# Patient Record
Sex: Female | Born: 1965 | Race: Black or African American | Hispanic: No | Marital: Single | Smoking: Former smoker
Health system: Southern US, Community
[De-identification: ages and names within clinical notes are randomized; demographics above are authoritative.]

## PROBLEM LIST (undated history)

## (undated) DIAGNOSIS — Z8711 Personal history of peptic ulcer disease: Secondary | ICD-10-CM

## (undated) DIAGNOSIS — Z8719 Personal history of other diseases of the digestive system: Secondary | ICD-10-CM

## (undated) DIAGNOSIS — I10 Essential (primary) hypertension: Secondary | ICD-10-CM

## (undated) DIAGNOSIS — R630 Anorexia: Secondary | ICD-10-CM

## (undated) DIAGNOSIS — IMO0001 Reserved for inherently not codable concepts without codable children: Secondary | ICD-10-CM

## (undated) DIAGNOSIS — K219 Gastro-esophageal reflux disease without esophagitis: Secondary | ICD-10-CM

## (undated) DIAGNOSIS — Z9889 Other specified postprocedural states: Secondary | ICD-10-CM

## (undated) DIAGNOSIS — D649 Anemia, unspecified: Secondary | ICD-10-CM

## (undated) DIAGNOSIS — M7072 Other bursitis of hip, left hip: Secondary | ICD-10-CM

## (undated) DIAGNOSIS — G43909 Migraine, unspecified, not intractable, without status migrainosus: Secondary | ICD-10-CM

## (undated) DIAGNOSIS — R112 Nausea with vomiting, unspecified: Secondary | ICD-10-CM

## (undated) HISTORY — PX: CHOLECYSTECTOMY: SHX55

## (undated) HISTORY — PX: JOINT REPLACEMENT: SHX530

## (undated) HISTORY — PX: BARTHOLIN GLAND CYST EXCISION: SHX565

## (undated) HISTORY — PX: HERNIA REPAIR: SHX51

---
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
## 1992-07-05 HISTORY — PX: TUBAL LIGATION: SHX77

## 2003-04-16 ENCOUNTER — Encounter: Payer: Self-pay | Admitting: Emergency Medicine

## 2003-04-16 ENCOUNTER — Emergency Department (HOSPITAL_COMMUNITY): Admission: EM | Admit: 2003-04-16 | Discharge: 2003-04-16 | Payer: Self-pay | Admitting: Emergency Medicine

## 2003-06-25 ENCOUNTER — Emergency Department (HOSPITAL_COMMUNITY): Admission: EM | Admit: 2003-06-25 | Discharge: 2003-06-26 | Payer: Self-pay | Admitting: *Deleted

## 2003-07-06 HISTORY — PX: EPIGASTRIC HERNIA REPAIR: SUR1177

## 2003-07-09 ENCOUNTER — Emergency Department (HOSPITAL_COMMUNITY): Admission: EM | Admit: 2003-07-09 | Discharge: 2003-07-09 | Payer: Self-pay | Admitting: Emergency Medicine

## 2003-07-12 ENCOUNTER — Ambulatory Visit (HOSPITAL_COMMUNITY): Admission: RE | Admit: 2003-07-12 | Discharge: 2003-07-12 | Payer: Self-pay

## 2003-07-12 ENCOUNTER — Ambulatory Visit (HOSPITAL_BASED_OUTPATIENT_CLINIC_OR_DEPARTMENT_OTHER): Admission: RE | Admit: 2003-07-12 | Discharge: 2003-07-12 | Payer: Self-pay

## 2003-09-19 ENCOUNTER — Emergency Department (HOSPITAL_COMMUNITY): Admission: EM | Admit: 2003-09-19 | Discharge: 2003-09-19 | Payer: Self-pay | Admitting: Emergency Medicine

## 2004-04-24 ENCOUNTER — Emergency Department (HOSPITAL_COMMUNITY): Admission: EM | Admit: 2004-04-24 | Discharge: 2004-04-25 | Payer: Self-pay | Admitting: Emergency Medicine

## 2004-08-19 ENCOUNTER — Emergency Department (HOSPITAL_COMMUNITY): Admission: EM | Admit: 2004-08-19 | Discharge: 2004-08-19 | Payer: Self-pay | Admitting: Emergency Medicine

## 2004-10-16 ENCOUNTER — Emergency Department (HOSPITAL_COMMUNITY): Admission: EM | Admit: 2004-10-16 | Discharge: 2004-10-16 | Payer: Self-pay | Admitting: Emergency Medicine

## 2004-11-17 ENCOUNTER — Emergency Department (HOSPITAL_COMMUNITY): Admission: EM | Admit: 2004-11-17 | Discharge: 2004-11-17 | Payer: Self-pay | Admitting: Internal Medicine

## 2005-05-11 ENCOUNTER — Emergency Department (HOSPITAL_COMMUNITY): Admission: EM | Admit: 2005-05-11 | Discharge: 2005-05-11 | Payer: Self-pay | Admitting: *Deleted

## 2005-07-19 ENCOUNTER — Emergency Department (HOSPITAL_COMMUNITY): Admission: EM | Admit: 2005-07-19 | Discharge: 2005-07-20 | Payer: Self-pay | Admitting: Emergency Medicine

## 2005-10-03 HISTORY — PX: INCISION AND DRAINAGE ABSCESS: SHX5864

## 2005-10-15 ENCOUNTER — Emergency Department (HOSPITAL_COMMUNITY): Admission: EM | Admit: 2005-10-15 | Discharge: 2005-10-16 | Payer: Self-pay | Admitting: Emergency Medicine

## 2005-10-18 ENCOUNTER — Emergency Department (HOSPITAL_COMMUNITY): Admission: EM | Admit: 2005-10-18 | Discharge: 2005-10-19 | Payer: Self-pay | Admitting: Emergency Medicine

## 2005-10-21 ENCOUNTER — Ambulatory Visit: Payer: Self-pay | Admitting: Obstetrics and Gynecology

## 2005-12-16 ENCOUNTER — Ambulatory Visit: Payer: Self-pay | Admitting: *Deleted

## 2005-12-16 ENCOUNTER — Encounter (INDEPENDENT_AMBULATORY_CARE_PROVIDER_SITE_OTHER): Payer: Self-pay | Admitting: *Deleted

## 2006-04-26 ENCOUNTER — Emergency Department (HOSPITAL_COMMUNITY): Admission: EM | Admit: 2006-04-26 | Discharge: 2006-04-26 | Payer: Self-pay | Admitting: Family Medicine

## 2006-05-06 ENCOUNTER — Encounter: Admission: RE | Admit: 2006-05-06 | Discharge: 2006-05-06 | Payer: Self-pay | Admitting: Family Medicine

## 2006-09-07 ENCOUNTER — Emergency Department (HOSPITAL_COMMUNITY): Admission: EM | Admit: 2006-09-07 | Discharge: 2006-09-07 | Payer: Self-pay | Admitting: Emergency Medicine

## 2007-02-26 ENCOUNTER — Emergency Department (HOSPITAL_COMMUNITY): Admission: EM | Admit: 2007-02-26 | Discharge: 2007-02-26 | Payer: Self-pay | Admitting: Emergency Medicine

## 2008-01-17 ENCOUNTER — Emergency Department (HOSPITAL_COMMUNITY): Admission: EM | Admit: 2008-01-17 | Discharge: 2008-01-17 | Payer: Self-pay | Admitting: Emergency Medicine

## 2008-06-13 ENCOUNTER — Emergency Department (HOSPITAL_COMMUNITY): Admission: EM | Admit: 2008-06-13 | Discharge: 2008-06-13 | Payer: Self-pay | Admitting: Emergency Medicine

## 2008-11-25 ENCOUNTER — Emergency Department (HOSPITAL_COMMUNITY): Admission: EM | Admit: 2008-11-25 | Discharge: 2008-11-25 | Payer: Self-pay | Admitting: Family Medicine

## 2009-01-13 ENCOUNTER — Emergency Department (HOSPITAL_COMMUNITY): Admission: EM | Admit: 2009-01-13 | Discharge: 2009-01-13 | Payer: Self-pay | Admitting: Family Medicine

## 2009-01-16 ENCOUNTER — Ambulatory Visit: Payer: Self-pay | Admitting: *Deleted

## 2009-01-31 ENCOUNTER — Ambulatory Visit: Payer: Self-pay | Admitting: Internal Medicine

## 2009-03-20 ENCOUNTER — Ambulatory Visit: Payer: Self-pay | Admitting: Internal Medicine

## 2009-03-20 ENCOUNTER — Encounter: Payer: Self-pay | Admitting: Family Medicine

## 2009-03-20 LAB — CONVERTED CEMR LAB
Alkaline Phosphatase: 44 units/L (ref 39–117)
BUN: 13 mg/dL (ref 6–23)
Basophils Relative: 1 % (ref 0–1)
CO2: 25 meq/L (ref 19–32)
Creatinine, Ser: 0.67 mg/dL (ref 0.40–1.20)
Eosinophils Absolute: 0.3 10*3/uL (ref 0.0–0.7)
Eosinophils Relative: 5 % (ref 0–5)
Glucose, Bld: 100 mg/dL — ABNORMAL HIGH (ref 70–99)
LDL Cholesterol: 119 mg/dL — ABNORMAL HIGH (ref 0–99)
Lymphs Abs: 2.5 10*3/uL (ref 0.7–4.0)
Monocytes Absolute: 0.4 10*3/uL (ref 0.1–1.0)
RBC: 4.21 M/uL (ref 3.87–5.11)
RDW: 14.6 % (ref 11.5–15.5)
TSH: 0.647 microintl units/mL (ref 0.350–4.500)
Total Bilirubin: 0.4 mg/dL (ref 0.3–1.2)
Total CHOL/HDL Ratio: 4.3
Total Protein: 6.9 g/dL (ref 6.0–8.3)
Triglycerides: 92 mg/dL (ref <150)
VLDL: 18 mg/dL (ref 0–40)
WBC: 5.8 10*3/uL (ref 4.0–10.5)

## 2009-05-27 ENCOUNTER — Emergency Department (HOSPITAL_COMMUNITY): Admission: EM | Admit: 2009-05-27 | Discharge: 2009-05-27 | Payer: Self-pay | Admitting: Emergency Medicine

## 2009-06-02 ENCOUNTER — Ambulatory Visit: Payer: Self-pay | Admitting: Internal Medicine

## 2009-07-15 ENCOUNTER — Emergency Department (HOSPITAL_COMMUNITY): Admission: EM | Admit: 2009-07-15 | Discharge: 2009-07-15 | Payer: Self-pay | Admitting: Emergency Medicine

## 2009-07-19 ENCOUNTER — Emergency Department (HOSPITAL_COMMUNITY): Admission: EM | Admit: 2009-07-19 | Discharge: 2009-07-19 | Payer: Self-pay | Admitting: Emergency Medicine

## 2009-08-02 ENCOUNTER — Ambulatory Visit (HOSPITAL_COMMUNITY): Admission: RE | Admit: 2009-08-02 | Discharge: 2009-08-02 | Payer: Self-pay | Admitting: Chiropractic Medicine

## 2010-02-06 ENCOUNTER — Ambulatory Visit: Payer: Self-pay | Admitting: Family Medicine

## 2010-02-06 ENCOUNTER — Encounter (INDEPENDENT_AMBULATORY_CARE_PROVIDER_SITE_OTHER): Payer: Self-pay | Admitting: Internal Medicine

## 2010-02-06 LAB — CONVERTED CEMR LAB: Microalb, Ur: 6.1 mg/dL — ABNORMAL HIGH (ref 0.00–1.89)

## 2010-02-13 ENCOUNTER — Ambulatory Visit (HOSPITAL_COMMUNITY): Admission: RE | Admit: 2010-02-13 | Discharge: 2010-02-13 | Payer: Self-pay | Admitting: Family Medicine

## 2010-04-08 ENCOUNTER — Ambulatory Visit: Payer: Self-pay | Admitting: Internal Medicine

## 2010-04-08 ENCOUNTER — Encounter (INDEPENDENT_AMBULATORY_CARE_PROVIDER_SITE_OTHER): Payer: Self-pay | Admitting: *Deleted

## 2010-04-08 LAB — CONVERTED CEMR LAB
ALT: 9 units/L (ref 0–35)
Albumin: 4 g/dL (ref 3.5–5.2)
Alkaline Phosphatase: 45 units/L (ref 39–117)
BUN: 11 mg/dL (ref 6–23)
Basophils Relative: 1 % (ref 0–1)
CO2: 28 meq/L (ref 19–32)
Cholesterol: 189 mg/dL (ref 0–200)
Glucose, Bld: 112 mg/dL — ABNORMAL HIGH (ref 70–99)
LDL Cholesterol: 123 mg/dL — ABNORMAL HIGH (ref 0–99)
Lymphocytes Relative: 39 % (ref 12–46)
Lymphs Abs: 2.4 10*3/uL (ref 0.7–4.0)
Monocytes Relative: 6 % (ref 3–12)
Neutrophils Relative %: 51 % (ref 43–77)
Platelets: 281 10*3/uL (ref 150–400)
RDW: 14.9 % (ref 11.5–15.5)
Total Protein: 7 g/dL (ref 6.0–8.3)
Triglycerides: 119 mg/dL (ref <150)
WBC: 6.1 10*3/uL (ref 4.0–10.5)

## 2010-09-20 LAB — URINE MICROSCOPIC-ADD ON

## 2010-09-20 LAB — URINALYSIS, ROUTINE W REFLEX MICROSCOPIC
Bilirubin Urine: NEGATIVE
Glucose, UA: NEGATIVE mg/dL
Nitrite: NEGATIVE
Urobilinogen, UA: 0.2 mg/dL (ref 0.0–1.0)
pH: 7.5 (ref 5.0–8.0)

## 2010-09-20 LAB — DIFFERENTIAL
Eosinophils Absolute: 0.3 10*3/uL (ref 0.0–0.7)
Lymphocytes Relative: 42 % (ref 12–46)
Monocytes Absolute: 0.6 10*3/uL (ref 0.1–1.0)
Neutro Abs: 3.5 10*3/uL (ref 1.7–7.7)
Neutrophils Relative %: 46 % (ref 43–77)

## 2010-09-20 LAB — BASIC METABOLIC PANEL
Calcium: 9.3 mg/dL (ref 8.4–10.5)
Chloride: 105 mEq/L (ref 96–112)
Creatinine, Ser: 0.63 mg/dL (ref 0.4–1.2)
GFR calc Af Amer: >60 mL/min (ref >60)
Potassium: 3.9 mEq/L (ref 3.5–5.1)
Sodium: 140 mEq/L (ref 135–145)

## 2010-09-20 LAB — URINE CULTURE

## 2010-09-20 LAB — CBC
HCT: 33.1 % — ABNORMAL LOW (ref 36.0–46.0)
MCV: 87.1 fL (ref 78.0–100.0)
Platelets: 239 10*3/uL (ref 150–400)

## 2010-10-01 ENCOUNTER — Emergency Department (HOSPITAL_COMMUNITY)
Admission: EM | Admit: 2010-10-01 | Discharge: 2010-10-01 | Disposition: A | Discharge status: Home / Self Care | Payer: Self-pay | Attending: Emergency Medicine | Admitting: Emergency Medicine

## 2010-10-01 DIAGNOSIS — Z79899 Other long term (current) drug therapy: Secondary | ICD-10-CM | POA: Insufficient documentation

## 2010-10-01 DIAGNOSIS — M545 Low back pain, unspecified: Secondary | ICD-10-CM | POA: Insufficient documentation

## 2010-10-01 DIAGNOSIS — J45909 Unspecified asthma, uncomplicated: Secondary | ICD-10-CM | POA: Insufficient documentation

## 2010-10-01 DIAGNOSIS — M25559 Pain in unspecified hip: Secondary | ICD-10-CM | POA: Insufficient documentation

## 2010-10-01 DIAGNOSIS — I10 Essential (primary) hypertension: Secondary | ICD-10-CM | POA: Insufficient documentation

## 2010-11-20 NOTE — Group Therapy Note (Signed)
NAME:  [name], ELFORD NO.:  [id number]   MEDICAL RECORD NO.:  [id number]          PATIENT TYPE:  WOC   LOCATION:  [facility]                   FACILITY:  [facility]   PHYSICIAN:  Argentina Donovan, MD        DATE OF BIRTH:  January 23, 1966   DATE OF SERVICE:  10/21/2005                                    CLINIC NOTE   CLINIC VISIT:  The patient is a 45 year old gravida 4, para 2, 0, 2, 2, who  went into the [facility] Emergency Room on [place] 13, 2007,  with a Bartholin gland abscess of the left labia.  This was of considerable  size apparently and was lanced and is healing well with no Ward catheter  placed in.  We discussed recurrence, a Ward catheter and marsupialization  with the patient.  We also told her that if it started swelling before that,  to come in and we could treat that.  We described the physiology and the use  of the gland, and also what caused the cyst and abscess formation.  She  seems to understand, and I would expect that eventually this will recur.   IMPRESSION:  Healing well post-incision and drainage for Bartholin abscess.           ______________________________  Argentina Donovan, MD     PR/MEDQ  D:  10/21/2005  T:  10/22/2005  Job:  [id number]

## 2010-11-20 NOTE — Op Note (Signed)
NAME:  [name], [name] NO.:  [id number]   MEDICAL RECORD NO.:  [id number]                   PATIENT TYPE:  AMB   LOCATION:  [facility]                                  FACILITY:  [facility]   PHYSICIAN:  Lorre Munroe., M.D.            DATE OF BIRTH:  January 17, 1966   DATE OF PROCEDURE:  07/12/2003  DATE OF DISCHARGE:                                 OPERATIVE REPORT   PREOPERATIVE DIAGNOSIS:  Epigastric hernia.   POSTOPERATIVE DIAGNOSIS:  Epigastric hernia.   OPERATION:  Repair of epigastric hernia.   SURGEON:  Lebron Conners, M.D.   ANESTHESIA:  General and local.   DESCRIPTION OF PROCEDURE:  After the patient was monitored and anesthetized  and had routine preparation and draping of the midabdomen, I made a 3 cm  incision above the soft bulge in the epigastrium and dissected down to it.  I separated the hernia contents from the normal surrounding fat and  dissected all the way down to the fascia.  The defect was about 2 cm in  size.  I cut the attachments of the hernia sac to the fascia and then  enlarged the opening very slightly and reduced the sizeable hernia to the  preperitoneal space.  There was no evidence that any omentum or other intra-  abdominal viscera were contained in the hernia.  I then spread out a 4 cm  circle of polypropylene mesh underneath the fascia after freeing up the  fascia from the overlying subcutaneous tissue to provide better mobility.  I  then closed the defect transversely with 0 Prolene, incorporating the mesh  with several of the sutures.  This completely obliterated the defect, and I  felt it was a strong repair.  I thoroughly anesthetized the surgical site  with long-acting local anesthetic and approximated the subcutaneous tissues  with several sutures of 4-0 Vicryl and closed the skin with intracuticular 4-  0 Vicryl and Steri-Strips.  I applied a bulky, somewhat compressive bandage.  The sponge, needle, and instrument  counts were correct.  The patient  tolerated the operation well.                                               Lorre Munroe., M.D.    Jodi Marble  D:  07/12/2003  T:  07/13/2003  Job:  [id number]

## 2010-11-20 NOTE — Group Therapy Note (Signed)
NAME:  DALLIS, DARDEN NO.:  [id number]   MEDICAL RECORD NO.:  [id number]          PATIENT TYPE:  WOC   LOCATION:  [facility]                   FACILITY:  [facility]   PHYSICIAN:  Carolanne Grumbling, M.D.   DATE OF BIRTH:  07-May-1966   DATE OF SERVICE:  12/16/2005                                    CLINIC NOTE   CHIEF COMPLAINT:  Wellness exam.   HISTORY OF PRESENT ILLNESS:  A 45 year old G4, P3-0-1-3, here for wellness  exam.  No complaints at this time.  Denies any history of any abnormal Pap  smears.  Menarche was at age 5.  Reports that she is having regular  periods.  LMP was December 07, 2005.   PAST MEDICAL HISTORY:  Asthma.   PAST SURGICAL HISTORY:  1.  Cholecystectomy.  2.  Tonsillectomy.  3.  Adenoidectomy.  4.  BTL.  5.  Hernia operation.   GYNECOLOGY HISTORY:  Per HPI.   MEDICATIONS:  Proventil as needed.   ALLERGIES:  SINGULAIR AND AMPICILLIN.   PHYSICAL EXAMINATION:  VITAL SIGNS:  Temperature 98.9, blood pressure  146/83, pulse 66, weight 83.6 pounds, height 5 feet 2 inches.  GENERAL APPEARANCE:  Single, alert, well-nourished female in no apparent  distress.  HEENT:  Normocephalic, atraumatic.  NECK:  Supple.  NO thyromegaly.  CV:  Regular rate and rhythm.  No murmur appreciated.  LUNGS:  Clear bilaterally.  Good air movement.  ABDOMEN:  Soft, nontender, nondistended.  Positive bowel sounds.  GU:  Normal external genitalia with pink rugae.  Cervix is multiparous.  Uterus is normal size.  Adnexa is free of masses.  BREASTS:  Symmetric.  No lesions.  No nodules.   IMPRESSION:  1.  Wellness exam.  2.  Elevated blood pressure.   PLAN:  Pap done today.  The patient does not desire any birth control  because she had a tubal.  Counseled to take her blood pressure at a store  and keep track of it to see if this is a problem for her.           ______________________________  Carolanne Grumbling, M.D.     TW/MEDQ  D:  01/14/2006  T:  01/14/2006  Job:   [id number]

## 2010-11-22 ENCOUNTER — Emergency Department (HOSPITAL_COMMUNITY)
Admission: EM | Admit: 2010-11-22 | Discharge: 2010-11-22 | Disposition: A | Discharge status: Home / Self Care | Payer: Self-pay | Attending: Emergency Medicine | Admitting: Emergency Medicine

## 2010-11-22 DIAGNOSIS — R1013 Epigastric pain: Secondary | ICD-10-CM | POA: Insufficient documentation

## 2010-11-22 DIAGNOSIS — J45909 Unspecified asthma, uncomplicated: Secondary | ICD-10-CM | POA: Insufficient documentation

## 2010-11-22 DIAGNOSIS — I10 Essential (primary) hypertension: Secondary | ICD-10-CM | POA: Insufficient documentation

## 2010-11-22 DIAGNOSIS — R1110 Vomiting, unspecified: Secondary | ICD-10-CM | POA: Insufficient documentation

## 2010-11-22 LAB — DIFFERENTIAL
Basophils Relative: 1 % (ref 0–1)
Monocytes Relative: 5 % (ref 3–12)
Neutro Abs: 2.9 10*3/uL (ref 1.7–7.7)
Neutrophils Relative %: 44 % (ref 43–77)

## 2010-11-22 LAB — URINALYSIS, ROUTINE W REFLEX MICROSCOPIC
Bilirubin Urine: NEGATIVE
Ketones, ur: NEGATIVE mg/dL
Leukocytes, UA: NEGATIVE
Nitrite: NEGATIVE
Urobilinogen, UA: 1 mg/dL (ref 0.0–1.0)

## 2010-11-22 LAB — COMPREHENSIVE METABOLIC PANEL
ALT: 11 U/L (ref 0–35)
AST: 13 U/L (ref 0–37)
CO2: 28 mEq/L (ref 19–32)
Chloride: 104 mEq/L (ref 96–112)
Creatinine, Ser: 0.58 mg/dL (ref 0.4–1.2)
GFR calc Af Amer: >60 mL/min (ref >60)
GFR calc non Af Amer: >60 mL/min (ref >60)
Sodium: 141 mEq/L (ref 135–145)
Total Bilirubin: 0.1 mg/dL — ABNORMAL LOW (ref 0.3–1.2)

## 2010-11-22 LAB — CBC
Hemoglobin: 11 g/dL — ABNORMAL LOW (ref 12.0–15.0)
MCH: 26 pg (ref 26.0–34.0)
RBC: 4.23 MIL/uL (ref 3.87–5.11)

## 2010-11-22 LAB — POCT PREGNANCY, URINE: Preg Test, Ur: NEGATIVE

## 2010-11-22 LAB — URINE MICROSCOPIC-ADD ON

## 2011-04-09 LAB — URINALYSIS, ROUTINE W REFLEX MICROSCOPIC
Ketones, ur: NEGATIVE mg/dL
Nitrite: NEGATIVE
Specific Gravity, Urine: 1.021 (ref 1.005–1.030)
Urobilinogen, UA: 0.2 mg/dL (ref 0.0–1.0)
pH: 6.5 (ref 5.0–8.0)

## 2011-04-09 LAB — BASIC METABOLIC PANEL
BUN: 8 mg/dL (ref 6–23)
Calcium: 9.6 mg/dL (ref 8.4–10.5)
Creatinine, Ser: 0.66 mg/dL (ref 0.4–1.2)
GFR calc non Af Amer: >60 mL/min (ref >60)
Glucose, Bld: 116 mg/dL — ABNORMAL HIGH (ref 70–99)

## 2011-04-09 LAB — DIFFERENTIAL
Basophils Absolute: 0 10*3/uL (ref 0.0–0.1)
Lymphocytes Relative: 35 % (ref 12–46)
Neutro Abs: 4.1 10*3/uL (ref 1.7–7.7)

## 2011-04-09 LAB — CBC
Platelets: 253 10*3/uL (ref 150–400)
RDW: 15.2 % (ref 11.5–15.5)

## 2011-04-09 LAB — URINE MICROSCOPIC-ADD ON

## 2011-04-16 LAB — URINALYSIS, ROUTINE W REFLEX MICROSCOPIC
Glucose, UA: NEGATIVE
Protein, ur: 100 — AB
pH: 5.5

## 2011-04-16 LAB — URINE CULTURE

## 2011-04-16 LAB — COMPREHENSIVE METABOLIC PANEL
ALT: 13
Alkaline Phosphatase: 58
BUN: 12
CO2: 28
Chloride: 101
Glucose, Bld: 105 — ABNORMAL HIGH
Potassium: 2.9 — ABNORMAL LOW
Sodium: 136
Total Bilirubin: 0.3

## 2011-04-16 LAB — URINE MICROSCOPIC-ADD ON

## 2011-04-16 LAB — DIFFERENTIAL
Basophils Absolute: 0
Basophils Relative: 0
Eosinophils Absolute: 0.2
Monocytes Relative: 8
Neutro Abs: 5.2
Neutrophils Relative %: 68

## 2011-04-16 LAB — CBC
HCT: 32.4 — ABNORMAL LOW
Hemoglobin: 10.9 — ABNORMAL LOW
RBC: 3.83 — ABNORMAL LOW
WBC: 7.7

## 2011-04-16 LAB — POCT PREGNANCY, URINE: Preg Test, Ur: NEGATIVE

## 2011-05-24 ENCOUNTER — Encounter: Payer: Self-pay | Admitting: *Deleted

## 2011-05-24 ENCOUNTER — Emergency Department (HOSPITAL_COMMUNITY)
Admission: EM | Admit: 2011-05-24 | Discharge: 2011-05-24 | Disposition: A | Discharge status: Home / Self Care | Payer: Self-pay | Attending: Emergency Medicine | Admitting: Emergency Medicine

## 2011-05-24 DIAGNOSIS — M79609 Pain in unspecified limb: Secondary | ICD-10-CM | POA: Insufficient documentation

## 2011-05-24 DIAGNOSIS — M79605 Pain in left leg: Secondary | ICD-10-CM

## 2011-05-24 MED ORDER — OXYCODONE-ACETAMINOPHEN 5-325 MG PO TABS: 2.0000 | ORAL_TABLET | ORAL | Status: AC | PRN

## 2011-05-24 MED ORDER — OXYCODONE-ACETAMINOPHEN 5-325 MG PO TABS
1.0000 | ORAL_TABLET | Freq: Once | ORAL | Status: AC
  Administered 2011-05-24: 1 via ORAL
  Filled 2011-05-24: qty 1

## 2011-05-24 MED ORDER — ENOXAPARIN SODIUM 80 MG/0.8ML ~~LOC~~ SOLN
1.0000 mg/kg | Freq: Once | SUBCUTANEOUS | Status: AC
  Administered 2011-05-24: 80 mg via SUBCUTANEOUS
  Filled 2011-05-24: qty 0.8

## 2011-05-24 MED ORDER — IBUPROFEN 600 MG PO TABS: 600.0000 mg | ORAL_TABLET | Freq: Three times a day (TID) | ORAL | Status: AC | PRN

## 2011-05-24 NOTE — ED Provider Notes (Signed)
History     CSN: [id number] Arrival date & time: 05/24/2011  5:54 PM   First MD Initiated Contact with Patient 05/24/11 2131      Chief Complaint  Patient presents with  . Leg Pain    (Consider location/radiation/quality/duration/timing/severity/associated sxs/prior treatment) The history is provided by the patient.   patient reports approximately 2 weeks of left calf pain with radiation to her leg.  She also reports swelling of her left calf and left upper leg.  No history of DVT or PE.  She reports her pain is worse with palpation and with movement as well as with standing.  His had no trauma to this area.  She reports feeling a "knot" in the left lower leg.  She's had no redness warmth or fever.  She's had no chills.  She has normal sensation in her lower leg and reports no weakness of her lower right.  Nothing improves her symptoms.  Her symptoms are worse as above.  Her symptoms are moderate in severity and they're constant  Past Medical History  Diagnosis Date  . Asthma     Past Surgical History  Procedure Date  . Cholecystectomy   . Tubal ligation   . Hernia repair   . Tonsillectomy   . Adenoidectomy     No family history on file.  History  Substance Use Topics  . Smoking status: Not on file  . Smokeless tobacco: Not on file  . Alcohol Use:     OB History    Grav Para Term Preterm Abortions TAB SAB Ect Mult Living                  Review of Systems  Allergies  Ampicillin and Singulair  Home Medications   Current Outpatient Rx  Name Route Sig Dispense Refill  . IBUPROFEN 200 MG PO TABS Oral Take 200 mg by mouth every 6 (six) hours as needed.      . ALBUTEROL SULFATE HFA 108 (90 BASE) MCG/ACT IN AERS Inhalation Inhale 2 puffs into the lungs every 6 (six) hours as needed.     . IBUPROFEN 600 MG PO TABS Oral Take 1 tablet (600 mg total) by mouth every 8 (eight) hours as needed for pain. 15 tablet 0  . OXYCODONE-ACETAMINOPHEN 5-325 MG PO TABS Oral Take 2  tablets by mouth every 4 (four) hours as needed for pain. 15 tablet 0    BP 157/84  Pulse 88  Temp(Src) 99.9 F (37.7 C) (Oral)  Resp 16  Ht 5\' 2"  (1.575 m)  Wt 175 lb (79.379 kg)  BMI 32.01 kg/m2  SpO2 100%  LMP 05/05/2011  Physical Exam  Nursing note and vitals reviewed. Constitutional: She is oriented to person, place, and time. She appears well-developed and well-nourished.  HENT:  Head: Normocephalic.  Eyes: EOM are normal.  Neck: Normal range of motion.  Pulmonary/Chest: Effort normal.  Musculoskeletal: Normal range of motion.       And left lower calf tenderness without erythema warmth.  Compartments are soft.  Left foot is normal PT and DP pulses.  I don't appreciate significant swelling of her left leg as compared to her right  Neurological: She is alert and oriented to person, place, and time.  Psychiatric: She has a normal mood and affect.    ED Course  Procedures (including critical care time)  Labs Reviewed - No data to display No results found.   1. Left leg pain       MDM  Left lower extremity pain is likely represents left calf strain.  Will obtain outpatient ultrasound to evaluate for DVT.  Lovenox given in the ER.  Patient will obtain her ultrasound in the morning.  Home with Percocet and ibuprofen.        Lyanne Co, MD 05/24/11 2221

## 2011-05-24 NOTE — ED Notes (Signed)
Pt with c/o left calf pain x 2 weeks. Pt states her calf has been swelling and she feels a knot in her calf. Pain radiates throughout entire left leg. Pt is ambulatory.

## 2011-05-24 NOTE — ED Notes (Signed)
C/o left calf pain x 2 weeks. Pt says she has swelling & knot in her calf that radiates up and down her leg.

## 2011-05-25 ENCOUNTER — Ambulatory Visit (HOSPITAL_COMMUNITY)
Admission: RE | Admit: 2011-05-25 | Discharge: 2011-05-25 | Disposition: A | Discharge status: Home / Self Care | Payer: Self-pay | Source: Ambulatory Visit | Attending: Emergency Medicine | Admitting: Emergency Medicine

## 2011-05-25 DIAGNOSIS — M79609 Pain in unspecified limb: Secondary | ICD-10-CM | POA: Insufficient documentation

## 2011-05-25 NOTE — Progress Notes (Signed)
  PRELIMINARY  PRELIMINARY  PRELIMINARY  PRELIMINARY    Left lower extremity venous duplex completed.  Preliminary report:  Left:  No evidence of DVT, superficial thrombosis, or Baker's cyst.   Milta Deiters, [place] D 05/25/2011 9:00 AM

## 2011-07-09 ENCOUNTER — Emergency Department (HOSPITAL_COMMUNITY)
Admission: EM | Admit: 2011-07-09 | Discharge: 2011-07-09 | Disposition: A | Discharge status: Home / Self Care | Payer: Self-pay | Attending: Emergency Medicine | Admitting: Emergency Medicine

## 2011-07-09 ENCOUNTER — Encounter (HOSPITAL_COMMUNITY): Payer: Self-pay | Admitting: Emergency Medicine

## 2011-07-09 DIAGNOSIS — J45909 Unspecified asthma, uncomplicated: Secondary | ICD-10-CM | POA: Insufficient documentation

## 2011-07-09 DIAGNOSIS — J40 Bronchitis, not specified as acute or chronic: Secondary | ICD-10-CM | POA: Insufficient documentation

## 2011-07-09 DIAGNOSIS — R070 Pain in throat: Secondary | ICD-10-CM | POA: Insufficient documentation

## 2011-07-09 DIAGNOSIS — R059 Cough, unspecified: Secondary | ICD-10-CM | POA: Insufficient documentation

## 2011-07-09 DIAGNOSIS — R05 Cough: Secondary | ICD-10-CM | POA: Insufficient documentation

## 2011-07-09 MED ORDER — HYDROCOD POLST-CHLORPHEN POLST 10-8 MG/5ML PO LQCR: 5.0000 mL | Freq: Two times a day (BID) | ORAL | Status: DC | PRN

## 2011-07-09 MED ORDER — AZITHROMYCIN 250 MG PO TABS: 250.0000 mg | ORAL_TABLET | Freq: Every day | ORAL | Status: AC

## 2011-07-09 NOTE — ED Provider Notes (Signed)
History     CSN: [id number]  Arrival date & time 07/09/11  4098   First MD Initiated Contact with Patient 07/09/11 0719      Chief Complaint  Patient presents with  . Cough    (Consider location/radiation/quality/duration/timing/severity/associated sxs/prior treatment) Patient is a 46 y.o. female presenting with cough. The history is provided by the patient.  Cough This is a new problem. The current episode started more than 1 week ago. The problem occurs constantly. The problem has been gradually worsening. The cough is productive of purulent sputum. There has been no fever. Associated symptoms include ear congestion and sore throat. Pertinent negatives include no chest pain and no chills. She has tried decongestants for the symptoms. She is not a smoker.    Past Medical History  Diagnosis Date  . Asthma     Past Surgical History  Procedure Date  . Cholecystectomy   . Tubal ligation   . Hernia repair   . Tonsillectomy   . Adenoidectomy     No family history on file.  History  Substance Use Topics  . Smoking status: Never Smoker   . Smokeless tobacco: Not on file  . Alcohol Use: No    OB History    Grav Para Term Preterm Abortions TAB SAB Ect Mult Living                  Review of Systems  Constitutional: Negative for chills.  HENT: Positive for sore throat.   Respiratory: Positive for cough.   Cardiovascular: Negative for chest pain.  All other systems reviewed and are negative.    Allergies  Ampicillin and Singulair  Home Medications   Current Outpatient Rx  Name Route Sig Dispense Refill  . ALBUTEROL SULFATE HFA 108 (90 BASE) MCG/ACT IN AERS Inhalation Inhale 2 puffs into the lungs every 6 (six) hours as needed.     . IBUPROFEN 200 MG PO TABS Oral Take 200 mg by mouth every 6 (six) hours as needed. For pain    . PHENYLEPH-CPM-DM-APAP 11-03-08-325 MG PO CAPS Oral Take 1 capsule by mouth 2 (two) times daily.        BP 155/120  Pulse 95  Temp(Src)  97.8 F (36.6 C) (Oral)  Resp 18  SpO2 100%  LMP 06/28/2011  Physical Exam  Nursing note and vitals reviewed. Constitutional: She is oriented to person, place, and time. She appears well-developed and well-nourished. No distress.  HENT:  Head: Normocephalic and atraumatic.       Fluid behind both tm's.  Neck: Normal range of motion. Neck supple.  Cardiovascular: Normal rate and regular rhythm.  Exam reveals no gallop and no friction rub.   No murmur heard. Pulmonary/Chest: Effort normal and breath sounds normal. No respiratory distress. She has no wheezes.  Abdominal: Soft. Bowel sounds are normal. She exhibits no distension. There is no tenderness.  Musculoskeletal: Normal range of motion.  Neurological: She is alert and oriented to person, place, and time.  Skin: Skin is warm and dry. She is not diaphoretic.    ED Course  Procedures (including critical care time)  Labs Reviewed - No data to display No results found.   No diagnosis found.    MDM          Geoffery Lyons, MD 07/09/11 [phone]

## 2011-07-09 NOTE — ED Notes (Signed)
PT. REPORTS PRODUCTIVE COUGH WITH BILATERAL EAR PAIN FOR SEVERAL DAYS , DIZZINESS , NASAL CONGESTION .

## 2013-06-13 ENCOUNTER — Emergency Department (HOSPITAL_COMMUNITY): Payer: Self-pay

## 2013-06-13 ENCOUNTER — Encounter (HOSPITAL_COMMUNITY): Payer: Self-pay | Admitting: Emergency Medicine

## 2013-06-13 ENCOUNTER — Emergency Department (HOSPITAL_COMMUNITY)
Admission: EM | Admit: 2013-06-13 | Discharge: 2013-06-14 | Disposition: A | Discharge status: Home / Self Care | Payer: Self-pay | Attending: Emergency Medicine | Admitting: Emergency Medicine

## 2013-06-13 DIAGNOSIS — E876 Hypokalemia: Secondary | ICD-10-CM | POA: Insufficient documentation

## 2013-06-13 DIAGNOSIS — Z3202 Encounter for pregnancy test, result negative: Secondary | ICD-10-CM | POA: Insufficient documentation

## 2013-06-13 DIAGNOSIS — H538 Other visual disturbances: Secondary | ICD-10-CM | POA: Insufficient documentation

## 2013-06-13 DIAGNOSIS — R079 Chest pain, unspecified: Secondary | ICD-10-CM | POA: Insufficient documentation

## 2013-06-13 DIAGNOSIS — Z79899 Other long term (current) drug therapy: Secondary | ICD-10-CM | POA: Insufficient documentation

## 2013-06-13 DIAGNOSIS — R002 Palpitations: Secondary | ICD-10-CM | POA: Insufficient documentation

## 2013-06-13 DIAGNOSIS — J45909 Unspecified asthma, uncomplicated: Secondary | ICD-10-CM | POA: Insufficient documentation

## 2013-06-13 DIAGNOSIS — Z888 Allergy status to other drugs, medicaments and biological substances status: Secondary | ICD-10-CM | POA: Insufficient documentation

## 2013-06-13 DIAGNOSIS — R109 Unspecified abdominal pain: Secondary | ICD-10-CM | POA: Insufficient documentation

## 2013-06-13 DIAGNOSIS — R0602 Shortness of breath: Secondary | ICD-10-CM | POA: Insufficient documentation

## 2013-06-13 DIAGNOSIS — Z881 Allergy status to other antibiotic agents status: Secondary | ICD-10-CM | POA: Insufficient documentation

## 2013-06-13 DIAGNOSIS — R42 Dizziness and giddiness: Secondary | ICD-10-CM | POA: Insufficient documentation

## 2013-06-13 LAB — CBC
HCT: 33.3 % — ABNORMAL LOW (ref 36.0–46.0)
MCH: 27.7 pg (ref 26.0–34.0)
MCV: 82.2 fL (ref 78.0–100.0)
Platelets: 257 10*3/uL (ref 150–400)
RBC: 4.05 MIL/uL (ref 3.87–5.11)

## 2013-06-13 LAB — BASIC METABOLIC PANEL
BUN: 14 mg/dL (ref 6–23)
CO2: 26 mEq/L (ref 19–32)
Calcium: 9.7 mg/dL (ref 8.4–10.5)
Chloride: 101 mEq/L (ref 96–112)
Creatinine, Ser: 0.69 mg/dL (ref 0.50–1.10)
GFR calc non Af Amer: >90 mL/min (ref >90)
Glucose, Bld: 113 mg/dL — ABNORMAL HIGH (ref 70–99)

## 2013-06-13 LAB — HEPATIC FUNCTION PANEL
ALT: 11 U/L (ref 0–35)
AST: 15 U/L (ref 0–37)
Albumin: 3.7 g/dL (ref 3.5–5.2)
Alkaline Phosphatase: 55 U/L (ref 39–117)
Bilirubin, Direct: <0.1 mg/dL (ref 0.0–0.3)
Total Bilirubin: 0.1 mg/dL — ABNORMAL LOW (ref 0.3–1.2)
Total Protein: 7.4 g/dL (ref 6.0–8.3)

## 2013-06-13 LAB — POCT I-STAT TROPONIN I: Troponin i, poc: 0.01 ng/mL (ref 0.00–0.08)

## 2013-06-13 MED ORDER — POTASSIUM CHLORIDE CRYS ER 20 MEQ PO TBCR
40.0000 meq | EXTENDED_RELEASE_TABLET | Freq: Once | ORAL | Status: AC
  Administered 2013-06-13: 40 meq via ORAL
  Filled 2013-06-13: qty 2

## 2013-06-13 MED ORDER — ASPIRIN 81 MG PO CHEW
324.0000 mg | CHEWABLE_TABLET | Freq: Once | ORAL | Status: AC
  Administered 2013-06-13: 324 mg via ORAL
  Filled 2013-06-13: qty 4

## 2013-06-13 MED ORDER — MORPHINE SULFATE 4 MG/ML IJ SOLN
4.0000 mg | Freq: Once | INTRAMUSCULAR | Status: AC
  Administered 2013-06-14: 4 mg via INTRAVENOUS
  Filled 2013-06-13: qty 1

## 2013-06-13 MED ORDER — ONDANSETRON HCL 4 MG/2ML IJ SOLN
4.0000 mg | Freq: Once | INTRAMUSCULAR | Status: AC
  Administered 2013-06-14: 4 mg via INTRAVENOUS
  Filled 2013-06-13: qty 2

## 2013-06-13 MED ORDER — NITROGLYCERIN 0.4 MG SL SUBL
0.4000 mg | SUBLINGUAL_TABLET | SUBLINGUAL | Status: DC | PRN
  Administered 2013-06-13: 0.4 mg via SUBLINGUAL

## 2013-06-13 MED ORDER — KETOROLAC TROMETHAMINE 30 MG/ML IJ SOLN
30.0000 mg | Freq: Once | INTRAMUSCULAR | Status: AC
  Administered 2013-06-14: 30 mg via INTRAVENOUS
  Filled 2013-06-13: qty 1

## 2013-06-13 NOTE — ED Notes (Signed)
Presents with 5 days of dull chest pain began on left side of chest and has moved to center of chest associated with blurred vision, lightheadedness, SOB and left arm pain,  upper abdominal pain. Pain is described as dull and constant. Rest makes pain better, exertion makes pain worse.  Chest pain rated 9/10. Reports she feels like her heart is racing, heart rate in the 80s at this time per EKG.

## 2013-06-13 NOTE — ED Notes (Signed)
Pt states pain is worse after nitro, states she does not want to take another one

## 2013-06-13 NOTE — ED Provider Notes (Signed)
CSN: [id number]     Arrival date & time 06/13/13  1940 History   First MD Initiated Contact with Patient 06/13/13 2022     Chief Complaint  Patient presents with  . Chest Pain   (Consider location/radiation/quality/duration/timing/severity/associated sxs/prior Treatment) HPI Comments: Patient is a 47 year old female with a past medical history of asthma who presents with chest pain for the past 5 days. The pain started suddenly and is located in her central chest. The pain is dull and severe and has been constant for the past 5 days. Patient reports associated blurred vision, lightheadedness, SOB and upper abdominal pain. The pain is worse with exertion and better with rest. Patient reports palpitations as well. No alleviating factors. Patient is a non smoker. She denies recent travel, recent surgery, and prior history of DVT/PE.    Past Medical History  Diagnosis Date  . Asthma    Past Surgical History  Procedure Laterality Date  . Cholecystectomy    . Tubal ligation    . Hernia repair    . Tonsillectomy    . Adenoidectomy     History reviewed. No pertinent family history. History  Substance Use Topics  . Smoking status: Never Smoker   . Smokeless tobacco: Not on file  . Alcohol Use: No   OB History   Grav Para Term Preterm Abortions TAB SAB Ect Mult Living                 Review of Systems  Constitutional: Negative for fever, chills and fatigue.  HENT: Negative for trouble swallowing.   Eyes: Negative for visual disturbance.  Respiratory: Positive for shortness of breath.   Cardiovascular: Positive for chest pain. Negative for palpitations.  Gastrointestinal: Positive for abdominal pain. Negative for nausea, vomiting and diarrhea.  Genitourinary: Negative for dysuria and difficulty urinating.  Musculoskeletal: Negative for arthralgias and neck pain.  Skin: Negative for color change.  Neurological: Positive for light-headedness. Negative for dizziness and weakness.   Psychiatric/Behavioral: Negative for dysphoric mood.    Allergies  Ampicillin and Montelukast sodium  Home Medications   Current Outpatient Rx  Name  Route  Sig  Dispense  Refill  . albuterol (PROVENTIL HFA;VENTOLIN HFA) 108 (90 BASE) MCG/ACT inhaler   Inhalation   Inhale 2 puffs into the lungs every 6 (six) hours as needed for wheezing.           BP 161/88  Pulse 73  Resp 20  SpO2 100%  LMP 05/31/2013 Physical Exam  Nursing note and vitals reviewed. Constitutional: She is oriented to person, place, and time. She appears well-developed and well-nourished. No distress.  HENT:  Head: Normocephalic and atraumatic.  Eyes: Conjunctivae and EOM are normal.  Neck: Normal range of motion.  Cardiovascular: Normal rate and regular rhythm.  Exam reveals no gallop and no friction rub.   No murmur heard. Pulmonary/Chest: Effort normal and breath sounds normal. She has no wheezes. She has no rales. She exhibits no tenderness.  Abdominal: Soft. She exhibits no distension. There is no tenderness. There is no rebound and no guarding.  Musculoskeletal: Normal range of motion.  Neurological: She is alert and oriented to person, place, and time. Coordination normal.  Speech is goal-oriented. Moves limbs without ataxia.   Skin: Skin is warm and dry.  Psychiatric: She has a normal mood and affect. Her behavior is normal.    ED Course  Procedures (including critical care time)   Date: 06/13/2013  Rate: 89  Rhythm: normal sinus  rhythm  QRS Axis: normal  Intervals: normal  ST/T Wave abnormalities: indeterminate  Conduction Disutrbances:none  Narrative Interpretation: NSR without previous for comparison  Old EKG Reviewed: none available    Labs Review Labs Reviewed  CBC - Abnormal; Notable for the following:    Hemoglobin 11.2 (*)    HCT 33.3 (*)    All other components within normal limits  BASIC METABOLIC PANEL - Abnormal; Notable for the following:    Potassium 2.9 (*)     Glucose, Bld 113 (*)    All other components within normal limits  HEPATIC FUNCTION PANEL - Abnormal; Notable for the following:    Total Bilirubin 0.1 (*)    All other components within normal limits  LIPASE, BLOOD  POCT I-STAT TROPONIN I  POCT PREGNANCY, URINE  POCT I-STAT TROPONIN I   Imaging Review Dg Chest 2 View  06/13/2013   CLINICAL DATA:  History of asthma. Mid chest pain for 5 days. Shortness of breath.  EXAM: CHEST  2 VIEW  COMPARISON:  05/27/2009  FINDINGS: The heart size and mediastinal contours are within normal limits. Both lungs are clear. The visualized skeletal structures are unremarkable. Surgical clips are identified in the upper abdomen.  IMPRESSION: No active cardiopulmonary disease.   Electronically Signed   By: Rosalie Gums M.D.   On: 06/13/2013 22:34    EKG Interpretation   None       MDM   1. Chest pain   2. Hypokalemia     9:09 PM First troponin negative. Vitals stable and patient afebrile. Chest xray pending.   11:30 PM Labs show slightly low potassium. Patient will have PO potassium for hypokalemia. Second troponin negative. EKG shows no acute changes. Patient is PERC negative. Patient's HEART score is 2, making her low risk for major cardiac events. Patient will be discharged with recommended follow up with her PCP.    Emilia Beck, PA-C 06/14/13 0006

## 2013-06-14 MED ORDER — ONDANSETRON HCL 4 MG/2ML IJ SOLN
4.0000 mg | Freq: Once | INTRAMUSCULAR | Status: AC
  Administered 2013-06-14: 4 mg via INTRAVENOUS

## 2013-06-14 MED ORDER — ONDANSETRON HCL 4 MG/2ML IJ SOLN
INTRAMUSCULAR | Status: AC
  Filled 2013-06-14: qty 2

## 2013-06-14 NOTE — ED Notes (Signed)
Pt vomiting. Will notify PA.

## 2013-06-14 NOTE — ED Notes (Signed)
PA made aware of patients vomiting. zofran 4mg  IV ordered.

## 2013-06-14 NOTE — ED Notes (Signed)
Asking for crackers given

## 2013-06-14 NOTE — ED Provider Notes (Signed)
Medical screening examination/treatment/procedure(s) were performed by non-physician practitioner and as supervising physician I was immediately available for consultation/collaboration.  EKG Interpretation   None       I agree w/ PA EKG interpretation as above.   Shanna Cisco, MD 06/14/13 [phone]

## 2013-10-15 ENCOUNTER — Emergency Department (HOSPITAL_COMMUNITY): Payer: No Typology Code available for payment source

## 2013-10-15 ENCOUNTER — Encounter (HOSPITAL_COMMUNITY): Payer: Self-pay | Admitting: Emergency Medicine

## 2013-10-15 ENCOUNTER — Inpatient Hospital Stay (HOSPITAL_COMMUNITY)
Admission: EM | Admit: 2013-10-15 | Discharge: 2013-10-18 | DRG: 392 | Disposition: A | Discharge status: Home / Self Care | Payer: No Typology Code available for payment source | Attending: Internal Medicine | Admitting: Internal Medicine

## 2013-10-15 DIAGNOSIS — R112 Nausea with vomiting, unspecified: Secondary | ICD-10-CM

## 2013-10-15 DIAGNOSIS — K5732 Diverticulitis of large intestine without perforation or abscess without bleeding: Principal | ICD-10-CM | POA: Diagnosis present

## 2013-10-15 DIAGNOSIS — R0789 Other chest pain: Secondary | ICD-10-CM | POA: Diagnosis present

## 2013-10-15 DIAGNOSIS — K5792 Diverticulitis of intestine, part unspecified, without perforation or abscess without bleeding: Secondary | ICD-10-CM

## 2013-10-15 DIAGNOSIS — J45909 Unspecified asthma, uncomplicated: Secondary | ICD-10-CM | POA: Diagnosis present

## 2013-10-15 DIAGNOSIS — Z8719 Personal history of other diseases of the digestive system: Secondary | ICD-10-CM | POA: Diagnosis present

## 2013-10-15 DIAGNOSIS — R1032 Left lower quadrant pain: Secondary | ICD-10-CM

## 2013-10-15 HISTORY — DX: Anemia, unspecified: D64.9

## 2013-10-15 HISTORY — DX: Gastro-esophageal reflux disease without esophagitis: K21.9

## 2013-10-15 HISTORY — DX: Other bursitis of hip, left hip: M70.72

## 2013-10-15 HISTORY — DX: Migraine, unspecified, not intractable, without status migrainosus: G43.909

## 2013-10-15 HISTORY — DX: Essential (primary) hypertension: I10

## 2013-10-15 HISTORY — DX: Personal history of peptic ulcer disease: Z87.11

## 2013-10-15 HISTORY — DX: Personal history of other diseases of the digestive system: Z87.19

## 2013-10-15 LAB — URINE MICROSCOPIC-ADD ON

## 2013-10-15 LAB — URINALYSIS, ROUTINE W REFLEX MICROSCOPIC
BILIRUBIN URINE: NEGATIVE
Glucose, UA: NEGATIVE mg/dL
Ketones, ur: 15 mg/dL — AB
LEUKOCYTES UA: NEGATIVE
Nitrite: NEGATIVE
Protein, ur: 30 mg/dL — AB
SPECIFIC GRAVITY, URINE: 1.023 (ref 1.005–1.030)
Urobilinogen, UA: 0.2 mg/dL (ref 0.0–1.0)
pH: 6.5 (ref 5.0–8.0)

## 2013-10-15 LAB — CBC WITH DIFFERENTIAL/PLATELET
BASOS ABS: 0 10*3/uL (ref 0.0–0.1)
Basophils Relative: 0 % (ref 0–1)
Eosinophils Absolute: 0.2 10*3/uL (ref 0.0–0.7)
Eosinophils Relative: 1 % (ref 0–5)
HCT: 33 % — ABNORMAL LOW (ref 36.0–46.0)
Hemoglobin: 11.2 g/dL — ABNORMAL LOW (ref 12.0–15.0)
Lymphocytes Relative: 21 % (ref 12–46)
Lymphs Abs: 2.4 10*3/uL (ref 0.7–4.0)
MCH: 27 pg (ref 26.0–34.0)
MCHC: 33.9 g/dL (ref 30.0–36.0)
MCV: 79.5 fL (ref 78.0–100.0)
MONO ABS: 0.7 10*3/uL (ref 0.1–1.0)
Monocytes Relative: 6 % (ref 3–12)
NEUTROS ABS: 8.2 10*3/uL — AB (ref 1.7–7.7)
NEUTROS PCT: 72 % (ref 43–77)
PLATELETS: 275 10*3/uL (ref 150–400)
RBC: 4.15 MIL/uL (ref 3.87–5.11)
RDW: 14.7 % (ref 11.5–15.5)
WBC: 11.4 10*3/uL — ABNORMAL HIGH (ref 4.0–10.5)

## 2013-10-15 LAB — COMPREHENSIVE METABOLIC PANEL
ALT: 9 U/L (ref 0–35)
AST: 12 U/L (ref 0–37)
Albumin: 3.9 g/dL (ref 3.5–5.2)
Alkaline Phosphatase: 56 U/L (ref 39–117)
BILIRUBIN TOTAL: 0.3 mg/dL (ref 0.3–1.2)
BUN: 12 mg/dL (ref 6–23)
CHLORIDE: 101 meq/L (ref 96–112)
CO2: 23 mEq/L (ref 19–32)
CREATININE: 0.56 mg/dL (ref 0.50–1.10)
Calcium: 10 mg/dL (ref 8.4–10.5)
GFR calc Af Amer: >90 mL/min (ref >90)
GFR calc non Af Amer: >90 mL/min (ref >90)
Glucose, Bld: 97 mg/dL (ref 70–99)
POTASSIUM: 3.5 meq/L — AB (ref 3.7–5.3)
Sodium: 141 mEq/L (ref 137–147)
Total Protein: 7.8 g/dL (ref 6.0–8.3)

## 2013-10-15 LAB — PREGNANCY, URINE: Preg Test, Ur: NEGATIVE

## 2013-10-15 LAB — LIPASE, BLOOD: Lipase: 18 U/L (ref 11–59)

## 2013-10-15 MED ORDER — MORPHINE SULFATE 4 MG/ML IJ SOLN
4.0000 mg | Freq: Once | INTRAMUSCULAR | Status: AC
  Administered 2013-10-15: 4 mg via INTRAVENOUS
  Filled 2013-10-15: qty 1

## 2013-10-15 MED ORDER — SODIUM CHLORIDE 0.9 % IV SOLN
INTRAVENOUS | Status: DC
  Administered 2013-10-15: via INTRAVENOUS

## 2013-10-15 MED ORDER — IOHEXOL 300 MG/ML  SOLN
25.0000 mL | INTRAMUSCULAR | Status: AC
  Administered 2013-10-15: 25 mL via ORAL

## 2013-10-15 MED ORDER — SODIUM CHLORIDE 0.9 % IV BOLUS (SEPSIS)
1000.0000 mL | Freq: Once | INTRAVENOUS | Status: AC
  Administered 2013-10-15: 1000 mL via INTRAVENOUS

## 2013-10-15 MED ORDER — IOHEXOL 300 MG/ML  SOLN: 100.0000 mL | Freq: Once | INTRAMUSCULAR | Status: AC | PRN

## 2013-10-15 MED ORDER — CIPROFLOXACIN IN D5W 400 MG/200ML IV SOLN
400.0000 mg | Freq: Once | INTRAVENOUS | Status: AC
  Administered 2013-10-16: 400 mg via INTRAVENOUS
  Filled 2013-10-15: qty 200

## 2013-10-15 MED ORDER — METRONIDAZOLE IN NACL 5-0.79 MG/ML-% IV SOLN
500.0000 mg | Freq: Once | INTRAVENOUS | Status: AC
  Administered 2013-10-15: 500 mg via INTRAVENOUS
  Filled 2013-10-15: qty 100

## 2013-10-15 MED ORDER — ONDANSETRON HCL 4 MG/2ML IJ SOLN
4.0000 mg | Freq: Once | INTRAMUSCULAR | Status: AC
  Administered 2013-10-15: 4 mg via INTRAVENOUS
  Filled 2013-10-15: qty 2

## 2013-10-15 MED ORDER — HYDROMORPHONE HCL PF 1 MG/ML IJ SOLN
1.0000 mg | Freq: Once | INTRAMUSCULAR | Status: AC
  Administered 2013-10-15: 1 mg via INTRAVENOUS
  Filled 2013-10-15: qty 1

## 2013-10-15 NOTE — ED Notes (Signed)
Remains in CT.  Drink and crackers offered to SO

## 2013-10-15 NOTE — ED Notes (Signed)
Patient states she is still hurting but does not hurt if she does not move

## 2013-10-15 NOTE — ED Notes (Signed)
Husband assisted patient to use the bedpan.

## 2013-10-15 NOTE — ED Notes (Signed)
Patient states that she is feeling worse since she drank her contrast.

## 2013-10-15 NOTE — ED Notes (Signed)
Phlebotomy at the bedside  

## 2013-10-15 NOTE — ED Notes (Signed)
Pt in c/o LLQ abd pain, vomiting x2 and abdominal distention since this am, states symptoms have been progressively worse throughout the day

## 2013-10-15 NOTE — ED Notes (Signed)
Delay explained to patient, nad noted.

## 2013-10-15 NOTE — ED Notes (Signed)
Crackers and ginger ale given per MD

## 2013-10-15 NOTE — ED Provider Notes (Signed)
CSN: [id number]     Arrival date & time 10/15/13  1516 History   First MD Initiated Contact with Patient 10/15/13 1847     Chief Complaint  Patient presents with  . Abdominal Pain     (Consider location/radiation/quality/duration/timing/severity/associated sxs/prior Treatment) The history is provided by the patient.   history of present illness: 48 year old female who presents with chief complaint of abdominal pain. Onset of symptoms was 11 hours prior to arrival. Pain is located in the left lower quadrant. It is constant, sharp, nonradiating, rated 10 out of 10. She has had several episodes of associated nonbloody nonbilious emesis today. Last bowel movement was 2 days ago and was normal. She denies fevers, chills, dysuria, hematuria.  Past Medical History  Diagnosis Date  . Asthma    Past Surgical History  Procedure Laterality Date  . Cholecystectomy    . Tubal ligation    . Hernia repair    . Tonsillectomy    . Adenoidectomy     History reviewed. No pertinent family history. History  Substance Use Topics  . Smoking status: Never Smoker   . Smokeless tobacco: Not on file  . Alcohol Use: No   OB History   Grav Para Term Preterm Abortions TAB SAB Ect Mult Living                 Review of Systems  Constitutional: Negative for fever and chills.  HENT: Negative for congestion.   Eyes: Negative for pain.  Respiratory: Negative for shortness of breath.   Cardiovascular: Negative for chest pain.  Gastrointestinal: Positive for nausea, vomiting and abdominal pain. Negative for diarrhea and constipation.  Genitourinary: Negative for dysuria.  Musculoskeletal: Negative for back pain.  Skin: Negative for rash and wound.  Neurological: Negative for headaches.  All other systems reviewed and are negative.     Allergies  Amoxicillin; Ampicillin; and Montelukast sodium  Home Medications   Current Outpatient Rx  Name  Route  Sig  Dispense  Refill  . acetaminophen  (TYLENOL) 500 MG tablet   Oral   Take 500 mg by mouth every 6 (six) hours as needed for moderate pain.         Marland Kitchen albuterol (PROVENTIL HFA;VENTOLIN HFA) 108 (90 BASE) MCG/ACT inhaler   Inhalation   Inhale 2 puffs into the lungs every 6 (six) hours as needed for wheezing.          Marland Kitchen aspirin-acetaminophen-caffeine (EXCEDRIN MIGRAINE) 250-250-65 MG per tablet   Oral   Take 1 tablet by mouth every 6 (six) hours as needed (pain).          Marland Kitchen neomycin-bacitracin-polymyxin (NEOSPORIN) OINT   Topical   Apply 1 application topically as needed for irritation or wound care.          BP 148/85  Pulse 113  Temp(Src) 98.9 F (37.2 C) (Oral)  Resp 18  Wt 185 lb (83.915 kg)  SpO2 100%  LMP 09/17/2013 Physical Exam  Nursing note and vitals reviewed. Constitutional: She is oriented to person, place, and time. She appears well-developed and well-nourished. No distress.  HENT:  Head: Normocephalic and atraumatic.  Eyes: Conjunctivae are normal.  Neck: Neck supple.  Cardiovascular: Normal rate, regular rhythm, normal heart sounds and intact distal pulses.   Pulmonary/Chest: Effort normal and breath sounds normal. She has no wheezes. She has no rales.  Abdominal: Soft. She exhibits no distension. There is tenderness in the left lower quadrant. There is guarding.  Musculoskeletal: Normal range of motion.  Neurological: She is alert and oriented to person, place, and time.  Skin: Skin is warm and dry.    ED Course  Procedures (including critical care time) Labs Review Labs Reviewed  CBC WITH DIFFERENTIAL - Abnormal; Notable for the following:    WBC 11.4 (*)    Hemoglobin 11.2 (*)    HCT 33.0 (*)    Neutro Abs 8.2 (*)    All other components within normal limits  COMPREHENSIVE METABOLIC PANEL - Abnormal; Notable for the following:    Potassium 3.5 (*)    All other components within normal limits  URINALYSIS, ROUTINE W REFLEX MICROSCOPIC - Abnormal; Notable for the following:     Hgb urine dipstick LARGE (*)    Ketones, ur 15 (*)    Protein, ur 30 (*)    All other components within normal limits  URINE MICROSCOPIC-ADD ON - Abnormal; Notable for the following:    Squamous Epithelial / LPF FEW (*)    Bacteria, UA FEW (*)    All other components within normal limits  LIPASE, BLOOD  PREGNANCY, URINE   Imaging Review Ct Abdomen Pelvis W Contrast  10/15/2013   CLINICAL DATA:  Abdominal pain.  EXAM: CT ABDOMEN AND PELVIS WITH CONTRAST  TECHNIQUE: Multidetector CT imaging of the abdomen and pelvis was performed using the standard protocol following bolus administration of intravenous contrast.  CONTRAST:  100 mL Omnipaque 300 IV  COMPARISON:  CT ABD W/CM dated 06/25/2003  FINDINGS: The liver, pancreas, adrenal glands and spleen are unremarkable. The gallbladder has been removed. There is no evidence of biliary ductal dilatation. There is some scarring of the right kidney. Two right renal cysts have a benign appearance. No hydronephrosis or renal calculi identified.  There is evidence of wall thickening and inflammation of the lower descending colon near its juncture with the sigmoid colon. In this region, there are some sparse diverticula present and this most likely represents acute diverticulitis. Due to the prominent wall thickness of the colon at this level, followup is recommended to exclude the possibility of an underlying mass.  No free fluid, abscess or free air is identified. No bowel obstruction is identified. Previously identified ventral hernia is no longer present. A small amount of free fluid is identified in the pelvis. The bladder is unremarkable. No enlarged lymph nodes are seen. Bony structures are unremarkable.  IMPRESSION: Thickening and inflammation of a focal segment of the colon near the juncture of the descending and sigmoid colon. This most likely represents acute diverticulitis. Given wall thickening present, recommend follow-up to exclude mass. There is no  evidence of free air or focal abscess.   Electronically Signed   By: Aletta Edouard M.D.   On: 10/15/2013 21:04     EKG Interpretation None      MDM   Final diagnoses:  Diverticulitis    Zhara L Younkin is a 48 y.o. female with PMSH of asthma, cholecystectomy, hernia repair, tubal ligation here with 1 day of left lower quadrant pain with associated nausea and vomiting. Hypertensive to 166/105 with vital signs otherwise stable.  Concern for diverticulitis, bowel disruption, atypical presentation of appendicitis. Have low suspicion for renal stone. No upper abdominal pain or tenderness doubt pancreatitis but will check lipase.  Labs/Imaging: CBC, CMP, lipase, UA, UPT, CT abdomen pelvis.  Giving IVF, Zofran, morphine.  CT consistent with diverticulitis. No improvement in pain with morphine patient given Dilaudid with some improvement in pain. Additional Zofran also given and patient unable to tolerate  minimal by mouth. Given Cipro and Flagyl.  Medicine consult and will admit for further management of diverticulitis.  Renaldo Reel, MD 10/16/13 [phone]

## 2013-10-15 NOTE — ED Notes (Signed)
Contrast completed and CT notified.

## 2013-10-16 ENCOUNTER — Encounter (HOSPITAL_COMMUNITY): Payer: Self-pay | Admitting: General Practice

## 2013-10-16 DIAGNOSIS — R1032 Left lower quadrant pain: Secondary | ICD-10-CM

## 2013-10-16 DIAGNOSIS — K5732 Diverticulitis of large intestine without perforation or abscess without bleeding: Principal | ICD-10-CM

## 2013-10-16 DIAGNOSIS — K5792 Diverticulitis of intestine, part unspecified, without perforation or abscess without bleeding: Secondary | ICD-10-CM | POA: Diagnosis present

## 2013-10-16 DIAGNOSIS — R52 Pain, unspecified: Secondary | ICD-10-CM

## 2013-10-16 DIAGNOSIS — R112 Nausea with vomiting, unspecified: Secondary | ICD-10-CM

## 2013-10-16 DIAGNOSIS — Z8719 Personal history of other diseases of the digestive system: Secondary | ICD-10-CM | POA: Diagnosis present

## 2013-10-16 LAB — CBC
HCT: 31 % — ABNORMAL LOW (ref 36.0–46.0)
Hemoglobin: 10.4 g/dL — ABNORMAL LOW (ref 12.0–15.0)
MCH: 26.9 pg (ref 26.0–34.0)
MCHC: 33.5 g/dL (ref 30.0–36.0)
MCV: 80.1 fL (ref 78.0–100.0)
Platelets: 237 K/uL (ref 150–400)
RBC: 3.87 MIL/uL (ref 3.87–5.11)
RDW: 14.5 % (ref 11.5–15.5)
WBC: 11 K/uL — ABNORMAL HIGH (ref 4.0–10.5)

## 2013-10-16 MED ORDER — ASPIRIN-ACETAMINOPHEN-CAFFEINE 250-250-65 MG PO TABS
1.0000 | ORAL_TABLET | Freq: Four times a day (QID) | ORAL | Status: DC | PRN
  Administered 2013-10-16 – 2013-10-17 (×2): 1 via ORAL
  Filled 2013-10-16 (×3): qty 1

## 2013-10-16 MED ORDER — ACETAMINOPHEN 500 MG PO TABS: 500.0000 mg | ORAL_TABLET | Freq: Four times a day (QID) | ORAL | Status: DC | PRN

## 2013-10-16 MED ORDER — ONDANSETRON HCL 4 MG PO TABS
4.0000 mg | ORAL_TABLET | Freq: Four times a day (QID) | ORAL | Status: DC | PRN
  Administered 2013-10-18: 4 mg via ORAL
  Filled 2013-10-16: qty 1

## 2013-10-16 MED ORDER — PNEUMOCOCCAL VAC POLYVALENT 25 MCG/0.5ML IJ INJ
0.5000 mL | INJECTION | INTRAMUSCULAR | Status: AC
  Administered 2013-10-17: 0.5 mL via INTRAMUSCULAR
  Filled 2013-10-16: qty 0.5

## 2013-10-16 MED ORDER — ENOXAPARIN SODIUM 40 MG/0.4ML ~~LOC~~ SOLN
40.0000 mg | SUBCUTANEOUS | Status: DC
  Administered 2013-10-16 – 2013-10-18 (×3): 40 mg via SUBCUTANEOUS
  Filled 2013-10-16 (×3): qty 0.4

## 2013-10-16 MED ORDER — ONDANSETRON HCL 4 MG/2ML IJ SOLN
4.0000 mg | Freq: Four times a day (QID) | INTRAMUSCULAR | Status: DC | PRN
  Administered 2013-10-16 – 2013-10-17 (×2): 4 mg via INTRAVENOUS
  Filled 2013-10-16 (×2): qty 2

## 2013-10-16 MED ORDER — SODIUM CHLORIDE 0.9 % IV SOLN
INTRAVENOUS | Status: AC
  Administered 2013-10-16: 75 mL/h via INTRAVENOUS

## 2013-10-16 MED ORDER — HYDROMORPHONE HCL PF 1 MG/ML IJ SOLN
1.0000 mg | INTRAMUSCULAR | Status: DC | PRN
  Administered 2013-10-16 – 2013-10-17 (×3): 1 mg via INTRAVENOUS
  Filled 2013-10-16 (×5): qty 1

## 2013-10-16 MED ORDER — ALBUTEROL SULFATE (2.5 MG/3ML) 0.083% IN NEBU
2.5000 mg | INHALATION_SOLUTION | Freq: Four times a day (QID) | RESPIRATORY_TRACT | Status: DC | PRN
Start: 2013-10-16 — End: 2013-10-18

## 2013-10-16 MED ORDER — CIPROFLOXACIN IN D5W 400 MG/200ML IV SOLN
400.0000 mg | Freq: Two times a day (BID) | INTRAVENOUS | Status: DC
  Administered 2013-10-16 – 2013-10-17 (×2): 400 mg via INTRAVENOUS
  Filled 2013-10-16 (×3): qty 200

## 2013-10-16 MED ORDER — SODIUM CHLORIDE 0.9 % IV SOLN
INTRAVENOUS | Status: DC
  Administered 2013-10-16 – 2013-10-17 (×2): via INTRAVENOUS
  Administered 2013-10-17: 125 mL/h via INTRAVENOUS

## 2013-10-16 MED ORDER — METRONIDAZOLE IN NACL 5-0.79 MG/ML-% IV SOLN
500.0000 mg | Freq: Three times a day (TID) | INTRAVENOUS | Status: DC
  Administered 2013-10-16 – 2013-10-17 (×4): 500 mg via INTRAVENOUS
  Filled 2013-10-16 (×5): qty 100

## 2013-10-16 NOTE — ED Provider Notes (Signed)
Medical screening examination/treatment/procedure(s) were conducted as a shared visit with resident-physician practitioner(s) and myself.  I personally evaluated the patient during the encounter.  Pt is a 48 y.o. female with pmhx as above presenting with ab pain.  Pt found to have diverticulitis on CT. Despite IVF, IV narcotics, pain poorly controlled and unable to tolerate adequate oral liquids. On PE, pt uncomfortable, but in NAD. +LLQ ttp w/o rebound or guarding. Will admit to medicine. Cipro/flagyl given.    Neta Ehlers, MD 10/16/13 1046

## 2013-10-16 NOTE — ED Notes (Signed)
Attempted to call report

## 2013-10-16 NOTE — H&P (Signed)
Chief Complaint:  N/v abd pain  HPI: 48 yo female with several days of abd pain started off general and has localized to llq last day, with associated n/v.  No fevers.  No diarrhea.  Has never had this pain before.  Vomit nonbloody.  Has vomited several times today.  Feels better with iv pain meds in ED.  No h/o colonoscopy before.  Review of Systems:  Positive and negative as per HPI otherwise all other systems are negative  Past Medical History: Past Medical History  Diagnosis Date  . Asthma    Past Surgical History  Procedure Laterality Date  . Cholecystectomy    . Tubal ligation    . Hernia repair    . Tonsillectomy    . Adenoidectomy      Medications: Prior to Admission medications   Medication Sig Start Date End Date Taking? Authorizing Provider  acetaminophen (TYLENOL) 500 MG tablet Take 500 mg by mouth every 6 (six) hours as needed for moderate pain.   Yes Historical Provider, MD  albuterol (PROVENTIL HFA;VENTOLIN HFA) 108 (90 BASE) MCG/ACT inhaler Inhale 2 puffs into the lungs every 6 (six) hours as needed for wheezing.    Yes Historical Provider, MD  aspirin-acetaminophen-caffeine (EXCEDRIN MIGRAINE) [phone] MG per tablet Take 1 tablet by mouth every 6 (six) hours as needed (pain).    Yes Historical Provider, MD  neomycin-bacitracin-polymyxin (NEOSPORIN) OINT Apply 1 application topically as needed for irritation or wound care.   Yes Historical Provider, MD    Allergies:   Allergies  Allergen Reactions  . Amoxicillin Rash  . Ampicillin Rash  . Montelukast Sodium Rash    Social History:  reports that she has never smoked. She does not have any smokeless tobacco history on file. She reports that she does not drink alcohol or use illicit drugs.  Family History: History reviewed. No pertinent family history.  Physical Exam: Filed Vitals:   10/15/13 2015 10/15/13 2101 10/15/13 2202 10/15/13 2337  BP: 145/98 159/95 162/99 148/85  Pulse: 90 85 113   Temp:       TempSrc:      Resp:  20 20 18   Weight:      SpO2: 100% 100% 99% 100%   General appearance: alert, cooperative and no distress Head: Normocephalic, without obvious abnormality, atraumatic Eyes: negative Nose: Nares normal. Septum midline. Mucosa normal. No drainage or sinus tenderness. Neck: no JVD and supple, symmetrical, trachea midline Lungs: clear to auscultation bilaterally Heart: regular rate and rhythm, S1, S2 normal, no murmur, click, rub or gallop Abdomen: soft ttp llq pos bs no r/g nonacute abd nd Extremities: extremities normal, atraumatic, no cyanosis or edema Pulses: 2+ and symmetric Skin: Skin color, texture, turgor normal. No rashes or lesions Neurologic: Grossly normal    Labs on Admission:   Recent Labs  10/15/13 1809  NA 141  K 3.5*  CL 101  CO2 23  GLUCOSE 97  BUN 12  CREATININE 0.56  CALCIUM 10.0    Recent Labs  10/15/13 1809  AST 12  ALT 9  ALKPHOS 56  BILITOT 0.3  PROT 7.8  ALBUMIN 3.9    Recent Labs  10/15/13 1809  LIPASE 18    Recent Labs  10/15/13 1809  WBC 11.4*  NEUTROABS 8.2*  HGB 11.2*  HCT 33.0*  MCV 79.5  PLT 275   Radiological Exams on Admission: Ct Abdomen Pelvis W Contrast  10/15/2013   CLINICAL DATA:  Abdominal pain.  EXAM: CT ABDOMEN AND PELVIS WITH  CONTRAST  TECHNIQUE: Multidetector CT imaging of the abdomen and pelvis was performed using the standard protocol following bolus administration of intravenous contrast.  CONTRAST:  100 mL Omnipaque 300 IV  COMPARISON:  CT ABD W/CM dated 06/25/2003  FINDINGS: The liver, pancreas, adrenal glands and spleen are unremarkable. The gallbladder has been removed. There is no evidence of biliary ductal dilatation. There is some scarring of the right kidney. Two right renal cysts have a benign appearance. No hydronephrosis or renal calculi identified.  There is evidence of wall thickening and inflammation of the lower descending colon near its juncture with the sigmoid colon. In  this region, there are some sparse diverticula present and this most likely represents acute diverticulitis. Due to the prominent wall thickness of the colon at this level, followup is recommended to exclude the possibility of an underlying mass.  No free fluid, abscess or free air is identified. No bowel obstruction is identified. Previously identified ventral hernia is no longer present. A small amount of free fluid is identified in the pelvis. The bladder is unremarkable. No enlarged lymph nodes are seen. Bony structures are unremarkable.  IMPRESSION: Thickening and inflammation of a focal segment of the colon near the juncture of the descending and sigmoid colon. This most likely represents acute diverticulitis. Given wall thickening present, recommend follow-up to exclude mass. There is no evidence of free air or focal abscess.   Electronically Signed   By: Aletta Edouard M.D.   On: 10/15/2013 21:04    Assessment/Plan  48 yo female with acute diverticulitis  Principal Problem:   Diverticulitis-  Iv cipro and flagyl until can tolerate po.  Full liq diet for now.  Ivf.  Iv dilaudid and zofran prn.  Place on med surg.  Active Problems:   Abdominal pain, acute, left lower quadrant   Nausea and vomiting in adult    Rachal A David 10/16/2013, 12:29 AM

## 2013-10-16 NOTE — Progress Notes (Signed)
I have seen and assessed patient and agree with Dr Camelia Eng assessment and plan.

## 2013-10-17 DIAGNOSIS — R0789 Other chest pain: Secondary | ICD-10-CM | POA: Clinically undetermined

## 2013-10-17 LAB — CBC
HCT: 31.2 % — ABNORMAL LOW (ref 36.0–46.0)
HEMOGLOBIN: 10.5 g/dL — AB (ref 12.0–15.0)
MCH: 27.4 pg (ref 26.0–34.0)
MCHC: 33.7 g/dL (ref 30.0–36.0)
MCV: 81.5 fL (ref 78.0–100.0)
Platelets: 232 10*3/uL (ref 150–400)
RBC: 3.83 MIL/uL — ABNORMAL LOW (ref 3.87–5.11)
RDW: 14.8 % (ref 11.5–15.5)
WBC: 7.3 10*3/uL (ref 4.0–10.5)

## 2013-10-17 LAB — BASIC METABOLIC PANEL
BUN: 5 mg/dL — ABNORMAL LOW (ref 6–23)
CHLORIDE: 103 meq/L (ref 96–112)
CO2: 26 meq/L (ref 19–32)
Calcium: 9.2 mg/dL (ref 8.4–10.5)
Creatinine, Ser: 0.6 mg/dL (ref 0.50–1.10)
GFR calc Af Amer: >90 mL/min (ref >90)
GLUCOSE: 122 mg/dL — AB (ref 70–99)
POTASSIUM: 4 meq/L (ref 3.7–5.3)
Sodium: 141 mEq/L (ref 137–147)

## 2013-10-17 LAB — MAGNESIUM: Magnesium: 1.7 mg/dL (ref 1.5–2.5)

## 2013-10-17 MED ORDER — CIPROFLOXACIN 500 MG/5ML (10%) PO SUSR: 500.0000 mg | Freq: Two times a day (BID) | ORAL | Status: DC

## 2013-10-17 MED ORDER — KETOROLAC TROMETHAMINE 30 MG/ML IJ SOLN
30.0000 mg | Freq: Once | INTRAMUSCULAR | Status: AC
  Administered 2013-10-17: 30 mg via INTRAVENOUS
  Filled 2013-10-17: qty 1

## 2013-10-17 MED ORDER — OXYCODONE HCL 5 MG/5ML PO SOLN
5.0000 mg | ORAL | Status: DC | PRN
  Administered 2013-10-17 – 2013-10-18 (×2): 5 mg via ORAL
  Filled 2013-10-17 (×2): qty 5

## 2013-10-17 MED ORDER — METRONIDAZOLE 50 MG/ML ORAL SUSPENSION
500.0000 mg | Freq: Three times a day (TID) | ORAL | Status: DC
  Administered 2013-10-17 – 2013-10-18 (×4): 500 mg via ORAL
  Filled 2013-10-17 (×5): qty 10

## 2013-10-17 MED ORDER — CIPROFLOXACIN HCL 500 MG PO TABS
500.0000 mg | ORAL_TABLET | Freq: Two times a day (BID) | ORAL | Status: DC
  Administered 2013-10-17 – 2013-10-18 (×3): 500 mg via ORAL
  Filled 2013-10-17 (×4): qty 1

## 2013-10-17 NOTE — Progress Notes (Signed)
Chaplain responded to spiritual consult. Pt stated she's feeling better and hopes to go home on Friday. She expressed concern about completing her school work but is trying to not worry about it until she gets home. She also stated that she lives with her brother and other family members and that they tend to bother her when she's studying. Chaplain helped her reflect on how to set boundaries around her study and rest time. Will follow up as necessary.

## 2013-10-17 NOTE — Progress Notes (Signed)
[facility] PROGRESS NOTE  [name] NFA:[id number] DOB: [birth year] DOA: 10/15/2013 PCP: No PCP Per Patient  Chart reviewed.  Assessment/Plan:  Principal Problem:   Diverticulitis, acute:  First episode. Tolerating full liquids. Advance to low-residue diet. Change medications to by mouth. Patient reports she is unable to swallow pills. Have ordered liquids. Saline lock IV. Will need followup with GI to schedule colonoscopy once symptoms resolve. Possibly home tomorrow if tolerating oral medications and solid diet. Also, patient is currently calling to find a primary care provider. Musculoskeletal chest pain: Will try a single dose of Toradol.   Code Status:  full Family Communication:   Disposition Plan:  Home tomorrow if tolerating oral medications.  Consultants:  None  Procedures:   None  Antibiotics:  Cipro, Flagyl, 4/14  HPI/Subjective: Had some nausea this morning, but unsure whether it is from her distaste for the full liquid diet. Abdominal pain improving. No vomiting. Last dose of pain medication last night. Having some chest wall pain below her left clavicle.  Objective: Filed Vitals:   10/17/13 0540  BP: 151/75  Pulse: 60  Temp: 98.8 F (37.1 C)  Resp: 18    Intake/Output Summary (Last 24 hours) at 10/17/13 0951 Last data filed at 10/17/13 0904  Gross per 24 hour  Intake  777.5 ml  Output   3150 ml  Net -2372.5 ml   Filed Weights   10/15/13 1539 10/16/13 0451  Weight: 83.915 kg (185 lb) 83.9 kg (184 lb 15.5 oz)    Exam:   General:  Talking on the phone. Appears comfortable. Multiple questions.  Cardiovascular: Regular rate rhythm without murmurs gallops rubs  Respiratory: Clear to auscultation bilaterally without wheezes rhonchi or rales  Abdomen: Soft nontender nondistended  Ext: No clubbing cyanosis or edema  Basic Metabolic Panel:  Recent Labs Lab 10/15/13 1809 10/17/13 0521  NA 141 141  K 3.5* 4.0  CL 101 103  CO2  23 26  GLUCOSE 97 122*  BUN 12 5*  CREATININE 0.56 0.60  CALCIUM 10.0 9.2  MG  --  1.7   Liver Function Tests:  Recent Labs Lab 10/15/13 1809  AST 12  ALT 9  ALKPHOS 56  BILITOT 0.3  PROT 7.8  ALBUMIN 3.9    Recent Labs Lab 10/15/13 1809  LIPASE 18   No results found for this basename: AMMONIA,  in the last 168 hours CBC:  Recent Labs Lab 10/15/13 1809 10/16/13 0640 10/17/13 0521  WBC 11.4* 11.0* 7.3  NEUTROABS 8.2*  --   --   HGB 11.2* 10.4* 10.5*  HCT 33.0* 31.0* 31.2*  MCV 79.5 80.1 81.5  PLT 275 237 232   Cardiac Enzymes: No results found for this basename: CKTOTAL, CKMB, CKMBINDEX, TROPONINI,  in the last 168 hours BNP (last 3 results) No results found for this basename: PROBNP,  in the last 8760 hours CBG: No results found for this basename: GLUCAP,  in the last 168 hours  No results found for this or any previous visit (from the past 240 hour(s)).   Studies: Ct Abdomen Pelvis W Contrast  10/15/2013   CLINICAL DATA:  Abdominal pain.  EXAM: CT ABDOMEN AND PELVIS WITH CONTRAST  TECHNIQUE: Multidetector CT imaging of the abdomen and pelvis was performed using the standard protocol following bolus administration of intravenous contrast.  CONTRAST:  100 mL Omnipaque 300 IV  COMPARISON:  CT ABD W/CM dated 06/25/2003  FINDINGS: The liver, pancreas, adrenal glands and spleen are unremarkable. The gallbladder has  been removed. There is no evidence of biliary ductal dilatation. There is some scarring of the right kidney. Two right renal cysts have a benign appearance. No hydronephrosis or renal calculi identified.  There is evidence of wall thickening and inflammation of the lower descending colon near its juncture with the sigmoid colon. In this region, there are some sparse diverticula present and this most likely represents acute diverticulitis. Due to the prominent wall thickness of the colon at this level, followup is recommended to exclude the possibility of an  underlying mass.  No free fluid, abscess or free air is identified. No bowel obstruction is identified. Previously identified ventral hernia is no longer present. A small amount of free fluid is identified in the pelvis. The bladder is unremarkable. No enlarged lymph nodes are seen. Bony structures are unremarkable.  IMPRESSION: Thickening and inflammation of a focal segment of the colon near the juncture of the descending and sigmoid colon. This most likely represents acute diverticulitis. Given wall thickening present, recommend follow-up to exclude mass. There is no evidence of free air or focal abscess.   Electronically Signed   By: Aletta Edouard M.D.   On: 10/15/2013 21:04    Scheduled Meds: . ciprofloxacin  500 mg Oral BID  . enoxaparin (LOVENOX) injection  40 mg Subcutaneous Q24H  . ketorolac  30 mg Intravenous Once  . metroNIDAZOLE  500 mg Oral TID  . pneumococcal 23 valent vaccine  0.5 mL Intramuscular Tomorrow-1000   Continuous Infusions:   Time spent: 35 minutes  [facility] Pager [phone]. If 7PM-7AM, please contact night-coverage at [email or web address], password [email or web address] 10/17/2013, 9:51 AM  LOS: 2 days

## 2013-10-18 MED ORDER — LORATADINE 5 MG/5ML PO SYRP
10.0000 mg | ORAL_SOLUTION | Freq: Every day | ORAL | Status: DC | PRN
  Filled 2013-10-18: qty 10

## 2013-10-18 MED ORDER — POLYETHYLENE GLYCOL 3350 17 G PO PACK
17.0000 g | PACK | Freq: Every day | ORAL | Status: DC
  Administered 2013-10-18: 17 g via ORAL
  Filled 2013-10-18: qty 1

## 2013-10-18 MED ORDER — CIPROFLOXACIN HCL 500 MG PO TABS: 500.0000 mg | ORAL_TABLET | Freq: Two times a day (BID) | ORAL | Status: DC

## 2013-10-18 MED ORDER — METRONIDAZOLE 50 MG/ML ORAL SUSPENSION: 500.0000 mg | Freq: Three times a day (TID) | ORAL | Status: DC

## 2013-10-18 MED ORDER — ONDANSETRON 4 MG PO TBDP: 4.0000 mg | ORAL_TABLET | Freq: Three times a day (TID) | ORAL | Status: DC | PRN

## 2013-10-18 MED ORDER — POLYETHYLENE GLYCOL 3350 17 G PO PACK: 17.0000 g | PACK | Freq: Every day | ORAL | Status: DC | PRN

## 2013-10-18 MED ORDER — DOCUSATE SODIUM 50 MG/5ML PO LIQD
100.0000 mg | Freq: Two times a day (BID) | ORAL | Status: DC | PRN
  Administered 2013-10-18: 100 mg via ORAL
  Filled 2013-10-18: qty 10

## 2013-10-18 MED ORDER — LORATADINE 5 MG/5ML PO SYRP: 10.0000 mg | ORAL_SOLUTION | Freq: Every day | ORAL | Status: DC | PRN

## 2013-10-18 MED ORDER — OXYCODONE HCL 5 MG/5ML PO SOLN: 5.0000 mg | Freq: Four times a day (QID) | ORAL | Status: DC | PRN

## 2013-10-18 MED ORDER — DOCUSATE SODIUM 50 MG/5ML PO LIQD: 100.0000 mg | Freq: Two times a day (BID) | ORAL | Status: DC | PRN

## 2013-10-18 NOTE — Progress Notes (Signed)
Patient discharged to home with instructions. 

## 2013-10-18 NOTE — Discharge Summary (Signed)
Physician Discharge Summary  Patient ID: [name] MRN: [id number] DOB/AGE: 1965/12/22 48 y.o.  Admit date: 10/15/2013 Discharge date: 10/18/2013  Primary Care Physician:  No PCP Per Patient  Discharge Diagnoses:    . acute Diverticulitis . Abdominal pain, acute, left lower quadrant . Nausea and vomiting in adult  Consults:  None   Recommendations for Outpatient Follow-up:  Patient was recommended to follow up with [place] for hospital followup until she has obtained a PCP through her insurance. She is in the process of doing that, she will need GI referral for outpatient colonoscopy.  Allergies:   Allergies  Allergen Reactions  . Amoxicillin Rash  . Ampicillin Rash  . Montelukast Sodium Rash     Discharge Medications:   Medication List         acetaminophen 500 MG tablet  Commonly known as:  TYLENOL  Take 500 mg by mouth every 6 (six) hours as needed for moderate pain.     albuterol 108 (90 BASE) MCG/ACT inhaler  Commonly known as:  PROVENTIL HFA;VENTOLIN HFA  Inhale 2 puffs into the lungs every 6 (six) hours as needed for wheezing.     aspirin-acetaminophen-caffeine 366-440-34 MG per tablet  Commonly known as:  EXCEDRIN MIGRAINE  Take 1 tablet by mouth every 6 (six) hours as needed (pain).     ciprofloxacin 500 MG tablet  Commonly known as:  CIPRO  Take 1 tablet (500 mg total) by mouth 2 (two) times daily. X 11 days     docusate 50 MG/5ML liquid  Commonly known as:  COLACE  Take 10 mLs (100 mg total) by mouth 2 (two) times daily as needed for mild constipation.     loratadine 5 MG/5ML syrup  Commonly known as:  CLARITIN  Take 10 mLs (10 mg total) by mouth daily as needed for allergies or rhinitis.     metroNIDAZOLE 50 mg/ml oral suspension  Commonly known as:  FLAGYL  Take 10 mLs (500 mg total) by mouth 3 (three) times daily. X 11 days     neomycin-bacitracin-polymyxin Oint  Commonly known as:  NEOSPORIN  Apply 1 application  topically as needed for irritation or wound care.     ondansetron 4 MG disintegrating tablet  Commonly known as:  ZOFRAN ODT  Take 1 tablet (4 mg total) by mouth every 8 (eight) hours as needed for nausea or vomiting.     oxyCODONE 5 MG/5ML solution  Commonly known as:  ROXICODONE  Take 5 mLs (5 mg total) by mouth every 6 (six) hours as needed for severe pain.     polyethylene glycol packet  Commonly known as:  MIRALAX / GLYCOLAX  Take 17 g by mouth daily as needed for moderate constipation or severe constipation.         Brief H and P: For complete details please refer to admission H and P, but in brief 48 yo female with several days of abd pain started off general and has localized to llq last day, with associated n/v. No fevers. No diarrhea. Has never had this pain before. Vomit nonbloody. Has vomited several times on the day of admission. She felt better with iv pain meds in ED. No h/o colonoscopy before.   Hospital Course:     Diverticulitis: Patient had presented with abdominal pain with a white count of 11.4K. CT abdomen was which showed thickening and inflammation of the focal segment of the colon near the juncture of descending and sigmoid colon,  representing  acute diverticulitis. The patient was started on IV ciprofloxacin and Flagyl, and pain control. She's tolerating solid diet at present. Patient was recommended outpatient GI referral for colonoscopy.     Abdominal pain, acute, left lower quadrant:  improved significantly, likely due to #1, patient also has constipation. She was provided stool softeners.    Nausea and vomiting in adult resolved  Day of Discharge BP 177/84  Pulse 78  Temp(Src) 99.4 F (37.4 C) (Oral)  Resp 18  Ht 5\' 1"  (1.549 m)  Wt 83.9 kg (184 lb 15.5 oz)  BMI 34.97 kg/m2  SpO2 100%  LMP 09/17/2013  Physical Exam: General: Alert and awake oriented x3 not in any acute distress. CVS: S1-S2 clear no murmur rubs or gallops Chest: clear to  auscultation bilaterally, no wheezing rales or rhonchi Abdomen: soft minimally tender in the left lower quadrant Extremities: no cyanosis, clubbing or edema noted bilaterally Neuro: Cranial nerves II-XII intact, no focal neurological deficits   The results of significant diagnostics from this hospitalization (including imaging, microbiology, ancillary and laboratory) are listed below for reference.    LAB RESULTS: Basic Metabolic Panel:  Recent Labs Lab 10/15/13 1809 10/17/13 0521  NA 141 141  K 3.5* 4.0  CL 101 103  CO2 23 26  GLUCOSE 97 122*  BUN 12 5*  CREATININE 0.56 0.60  CALCIUM 10.0 9.2  MG  --  1.7   Liver Function Tests:  Recent Labs Lab 10/15/13 1809  AST 12  ALT 9  ALKPHOS 56  BILITOT 0.3  PROT 7.8  ALBUMIN 3.9    Recent Labs Lab 10/15/13 1809  LIPASE 18   No results found for this basename: AMMONIA,  in the last 168 hours CBC:  Recent Labs Lab 10/15/13 1809 10/16/13 0640 10/17/13 0521  WBC 11.4* 11.0* 7.3  NEUTROABS 8.2*  --   --   HGB 11.2* 10.4* 10.5*  HCT 33.0* 31.0* 31.2*  MCV 79.5 80.1 81.5  PLT 275 237 232   Cardiac Enzymes: No results found for this basename: CKTOTAL, CKMB, CKMBINDEX, TROPONINI,  in the last 168 hours BNP: No components found with this basename: POCBNP,  CBG: No results found for this basename: GLUCAP,  in the last 168 hours  Significant Diagnostic Studies:  Ct Abdomen Pelvis W Contrast  10/15/2013   CLINICAL DATA:  Abdominal pain.  EXAM: CT ABDOMEN AND PELVIS WITH CONTRAST  TECHNIQUE: Multidetector CT imaging of the abdomen and pelvis was performed using the standard protocol following bolus administration of intravenous contrast.  CONTRAST:  100 mL Omnipaque 300 IV  COMPARISON:  CT ABD W/CM dated 06/25/2003  FINDINGS: The liver, pancreas, adrenal glands and spleen are unremarkable. The gallbladder has been removed. There is no evidence of biliary ductal dilatation. There is some scarring of the right kidney. Two  right renal cysts have a benign appearance. No hydronephrosis or renal calculi identified.  There is evidence of wall thickening and inflammation of the lower descending colon near its juncture with the sigmoid colon. In this region, there are some sparse diverticula present and this most likely represents acute diverticulitis. Due to the prominent wall thickness of the colon at this level, followup is recommended to exclude the possibility of an underlying mass.  No free fluid, abscess or free air is identified. No bowel obstruction is identified. Previously identified ventral hernia is no longer present. A small amount of free fluid is identified in the pelvis. The bladder is unremarkable. No enlarged lymph nodes are seen. Bony  structures are unremarkable.  IMPRESSION: Thickening and inflammation of a focal segment of the colon near the juncture of the descending and sigmoid colon. This most likely represents acute diverticulitis. Given wall thickening present, recommend follow-up to exclude mass. There is no evidence of free air or focal abscess.   Electronically Signed   By: Aletta Edouard M.D.   On: 10/15/2013 21:04       Disposition and Follow-up:     Discharge Orders   Future Orders Complete By Expires   Diet - low sodium heart healthy  As directed    Increase activity slowly  As directed        DISPOSITION: Home DIET: Soft diet   DISCHARGE FOLLOW-UP Follow-up Information   Follow up with [place]    . Schedule an appointment as soon as possible for a visit in 2 weeks. (for hospital follow-up)    Contact information:   [address] [place] [address] [phone]      Time spent on Discharge: 35 mins  Signed:   Mendel Corning M.D. [facility] 10/18/2013, 3:18 PM Pager: [phone]   **Disclaimer: This note was dictated with voice recognition software. Similar sounding words can inadvertently be transcribed and this  note may contain transcription errors which may not have been corrected upon publication of note.**

## 2013-11-08 ENCOUNTER — Telehealth: Payer: Self-pay | Admitting: General Practice

## 2013-11-08 NOTE — Telephone Encounter (Signed)
Pt called to schedule HFU, but states she does not want to schedule appt until she knows what copay she would have with her insurance. Pt will call when ready to schedule appt.

## 2013-12-28 ENCOUNTER — Emergency Department (HOSPITAL_COMMUNITY)
Admission: EM | Admit: 2013-12-28 | Discharge: 2013-12-28 | Disposition: A | Discharge status: Home / Self Care | Payer: No Typology Code available for payment source | Attending: Emergency Medicine | Admitting: Emergency Medicine

## 2013-12-28 ENCOUNTER — Encounter (HOSPITAL_COMMUNITY): Payer: Self-pay | Admitting: Emergency Medicine

## 2013-12-28 DIAGNOSIS — S30860A Insect bite (nonvenomous) of lower back and pelvis, initial encounter: Secondary | ICD-10-CM | POA: Insufficient documentation

## 2013-12-28 DIAGNOSIS — Z79899 Other long term (current) drug therapy: Secondary | ICD-10-CM | POA: Insufficient documentation

## 2013-12-28 DIAGNOSIS — Z7982 Long term (current) use of aspirin: Secondary | ICD-10-CM | POA: Insufficient documentation

## 2013-12-28 DIAGNOSIS — F17200 Nicotine dependence, unspecified, uncomplicated: Secondary | ICD-10-CM | POA: Insufficient documentation

## 2013-12-28 DIAGNOSIS — Z8739 Personal history of other diseases of the musculoskeletal system and connective tissue: Secondary | ICD-10-CM | POA: Insufficient documentation

## 2013-12-28 DIAGNOSIS — G43909 Migraine, unspecified, not intractable, without status migrainosus: Secondary | ICD-10-CM | POA: Insufficient documentation

## 2013-12-28 DIAGNOSIS — Z862 Personal history of diseases of the blood and blood-forming organs and certain disorders involving the immune mechanism: Secondary | ICD-10-CM | POA: Insufficient documentation

## 2013-12-28 DIAGNOSIS — I10 Essential (primary) hypertension: Secondary | ICD-10-CM | POA: Insufficient documentation

## 2013-12-28 DIAGNOSIS — J45909 Unspecified asthma, uncomplicated: Secondary | ICD-10-CM | POA: Insufficient documentation

## 2013-12-28 DIAGNOSIS — W57XXXA Bitten or stung by nonvenomous insect and other nonvenomous arthropods, initial encounter: Secondary | ICD-10-CM | POA: Insufficient documentation

## 2013-12-28 DIAGNOSIS — K219 Gastro-esophageal reflux disease without esophagitis: Secondary | ICD-10-CM | POA: Insufficient documentation

## 2013-12-28 DIAGNOSIS — Y939 Activity, unspecified: Secondary | ICD-10-CM | POA: Insufficient documentation

## 2013-12-28 DIAGNOSIS — Z880 Allergy status to penicillin: Secondary | ICD-10-CM | POA: Insufficient documentation

## 2013-12-28 DIAGNOSIS — Y929 Unspecified place or not applicable: Secondary | ICD-10-CM | POA: Insufficient documentation

## 2013-12-28 DIAGNOSIS — Z792 Long term (current) use of antibiotics: Secondary | ICD-10-CM | POA: Insufficient documentation

## 2013-12-28 MED ORDER — HYDROXYZINE HCL 25 MG PO TABS: 25.0000 mg | ORAL_TABLET | Freq: Four times a day (QID) | ORAL | Status: DC | PRN

## 2013-12-28 MED ORDER — HYDROXYZINE HCL 25 MG PO TABS
25.0000 mg | ORAL_TABLET | Freq: Once | ORAL | Status: AC
  Administered 2013-12-28: 25 mg via ORAL
  Filled 2013-12-28: qty 1

## 2013-12-28 NOTE — ED Provider Notes (Signed)
Medical screening examination/treatment/procedure(s) were performed by non-physician practitioner and as supervising physician I was immediately available for consultation/collaboration.   EKG Interpretation None       Nat Christen, MD 12/28/13 [phone]

## 2013-12-28 NOTE — ED Provider Notes (Signed)
CSN: [id number]     Arrival date & time 12/28/13  0631 History   First MD Initiated Contact with Patient 12/28/13 [phone]     Chief Complaint  Patient presents with  . Rash     (Consider location/radiation/quality/duration/timing/severity/associated sxs/prior Treatment) HPI  Patient reports itchy rash on her bilateral arms and occasionally on a few spots on other exposed areas.  Itching changes locations but is always on her arms.  Worse when she sleeps.  She has had itching at times on the back of her neck, one spot on her left chest, the top of her left foot.  Denies any pain, fevers, drainage from the lesions.  Denies anyone else has similar symptoms (lives alone).  Denies new personal care products, known insect bites, chemical exposures, new clothing or bedding or furniture.  She has "bombed" her room and washed her clothes without improvement.   Using hydrocortisone cream and allergy medication without relief.  Denies itching or swelling in her mouth or throat, denies difficulty swallowing or breathing.   Past Medical History  Diagnosis Date  . Asthma   . Hypertension   . Anemia   . GERD (gastroesophageal reflux disease)   . Headache(784.0)     "1-2 times/month; comes from stress & allergies"  . Migraine     "maybe twice in the last 10 yrs" (10/16/2013)  . Bursitis of left hip   . History of stomach ulcers    Past Surgical History  Procedure Laterality Date  . Tonsillectomy and adenoidectomy  1983  . Cholecystectomy  ~ 2000  . Hernia repair    . Epigastric hernia repair  07/2003  . Bartholin gland cyst excision    . Incision and drainage abscess  10/2005    Bartholin abscess.  . Tubal ligation  1994  . Cesarean section  1989   No family history on file. History  Substance Use Topics  . Smoking status: Current Some Day Smoker -- 1 years    Types: Cigarettes  . Smokeless tobacco: Never Used  . Alcohol Use: Yes     Comment: 10/16/2013 "glass of wine q 6 months or so"   OB  History   Grav Para Term Preterm Abortions TAB SAB Ect Mult Living                 Review of Systems  All other systems reviewed and are negative.     Allergies  Amoxicillin; Ampicillin; and Montelukast sodium  Home Medications   Prior to Admission medications   Medication Sig Start Date End Date Taking? Authorizing Provider  acetaminophen (TYLENOL) 500 MG tablet Take 500 mg by mouth every 6 (six) hours as needed for moderate pain.    Historical Provider, MD  albuterol (PROVENTIL HFA;VENTOLIN HFA) 108 (90 BASE) MCG/ACT inhaler Inhale 2 puffs into the lungs every 6 (six) hours as needed for wheezing.     Historical Provider, MD  aspirin-acetaminophen-caffeine (EXCEDRIN MIGRAINE) [phone] MG per tablet Take 1 tablet by mouth every 6 (six) hours as needed (pain).     Historical Provider, MD  ciprofloxacin (CIPRO) 500 MG tablet Take 1 tablet (500 mg total) by mouth 2 (two) times daily. X 11 days 10/18/13   Ripudeep Krystal Eaton, MD  docusate (COLACE) 50 MG/5ML liquid Take 10 mLs (100 mg total) by mouth 2 (two) times daily as needed for mild constipation. 10/18/13   Ripudeep Krystal Eaton, MD  hydrOXYzine (ATARAX/VISTARIL) 25 MG tablet Take 1 tablet (25 mg total) by mouth every  6 (six) hours as needed for itching. 12/28/13   Clayton Bibles, PA-C  loratadine (CLARITIN) 5 MG/5ML syrup Take 10 mLs (10 mg total) by mouth daily as needed for allergies or rhinitis. 10/18/13   Ripudeep Krystal Eaton, MD  metroNIDAZOLE (FLAGYL) 50 mg/ml oral suspension Take 10 mLs (500 mg total) by mouth 3 (three) times daily. X 11 days 10/18/13   Ripudeep Krystal Eaton, MD  neomycin-bacitracin-polymyxin (NEOSPORIN) OINT Apply 1 application topically as needed for irritation or wound care.    Historical Provider, MD  ondansetron (ZOFRAN ODT) 4 MG disintegrating tablet Take 1 tablet (4 mg total) by mouth every 8 (eight) hours as needed for nausea or vomiting. 10/18/13   Ripudeep Krystal Eaton, MD  oxyCODONE (ROXICODONE) 5 MG/5ML solution Take 5 mLs (5 mg total) by  mouth every 6 (six) hours as needed for severe pain. 10/18/13   Ripudeep Krystal Eaton, MD  polyethylene glycol (MIRALAX / GLYCOLAX) packet Take 17 g by mouth daily as needed for moderate constipation or severe constipation. 10/18/13   Ripudeep Krystal Eaton, MD   BP 165/83  Pulse 88  Temp(Src) 98.1 F (36.7 C) (Oral)  Resp 14  Ht 5\' 1"  (1.549 m)  Wt 180 lb (81.647 kg)  BMI 34.03 kg/m2  SpO2 100%  LMP 12/09/2013 Physical Exam  Nursing note and vitals reviewed. Constitutional: She appears well-developed and well-nourished. No distress.  HENT:  Head: Normocephalic and atraumatic.  Neck: Neck supple.  Pulmonary/Chest: Effort normal.  Neurological: She is alert.  Skin: She is not diaphoretic.  Small flat papules, hyperpigmented, nontender.  Mostly along bilateral arms.  Single lesion on left chest wall.  No erythema, edema, warmth, tenderness, or discharge.      ED Course  Procedures (including critical care time) Labs Review Labs Reviewed - No data to display  Imaging Review No results found.   EKG Interpretation None      Filed Vitals:   12/28/13 0737  BP: 165/83  Pulse:   Temp:   Resp:      MDM   Final diagnoses:  Insect bites  Essential hypertension    Afebrile nontoxic patient with what appear to be insect bites.  No e/o infection.  Pt hypertensive here, improved on second check.  Discussed risks of hypertension and encouraged PCP follow up.  D/C home with Vistaril.  Discussed  findings, treatment, and follow up  with patient.  Pt given return precautions.  Pt verbalizes understanding and agrees with plan.       [place], PA-C 12/28/13 [phone]

## 2013-12-28 NOTE — ED Notes (Signed)
Pt. reports itchy rashes at arms and feet onset last week unrelieved by OTC Benadryl .

## 2013-12-28 NOTE — Discharge Instructions (Signed)
Read the information below.  Use the prescribed medication as directed.  Please discuss all new medications with your pharmacist.  You may return to the Emergency Department at any time for worsening condition or any new symptoms that concern you.  If you develop redness, swelling, pus draining from the wound, or fevers greater than 100.4, return to the ER immediately for a recheck.    Insect Bite Mosquitoes, flies, fleas, bedbugs, and other insects can bite. Insect bites are different from insect stings. The bite may be red, puffy (swollen), and itchy for 2 to 4 days. Most bites get better on their own. HOME CARE   Do not scratch the bite.  Keep the bite clean and dry. Wash the bite with soap and water.  Put ice on the bite.  Put ice in a plastic bag.  Place a towel between your skin and the bag.  Leave the ice on for 20 minutes, 4 times a day. Do this for the first 2 to 3 days, or as told by your doctor.  You may use medicated lotions or creams to lessen itching as told by your doctor.  Only take medicines as told by your doctor.  If you are given medicines (antibiotics), take them as told. Finish them even if you start to feel better. You may need a tetanus shot if:  You cannot remember when you had your last tetanus shot.  You have never had a tetanus shot.  The injury broke your skin. If you need a tetanus shot and you choose not to have one, you may get tetanus. Sickness from tetanus can be serious. GET HELP RIGHT AWAY IF:   You have more pain, redness, or puffiness.  You see a red line on the skin coming from the bite.  You have a fever.  You have joint pain.  You have a headache or neck pain.  You feel weak.  You have a rash.  You have chest pain, or you are short of breath.  You have belly (abdominal) pain.  You feel sick to your stomach (nauseous) or throw up (vomit).  You feel very tired or sleepy. MAKE SURE YOU:   Understand these  instructions.  Will watch your condition.  Will get help right away if you are not doing well or get worse. Document Released: 06/18/2000 Document Revised: 09/13/2011 Document Reviewed: 01/20/2011 [facility] Patient Information 2015 [place], [place]. This information is not intended to replace advice given to you by your health care provider. Make sure you discuss any questions you have with your health care provider.   Bedbugs Bedbugs are tiny bugs that live in and around beds. They come out at night and bite people lying in bed. Bedbug bites rarely cause a medical problem. The bites do cause red, itchy bumps. HOME CARE  Only take medicine as told by your doctor.  Wear pajamas with long sleeves and pant legs.  Call a pest control expert. You may need to throw away your mattress. Ask the pest control expert what you can do to keep the bedbugs from coming back. You may need to:  Put a plastic cover over your mattress.  Wash your clothes and bedding in hot water. Dry them in a hot dryer. The temperature should be hotter than 120 F (48.9 C).  Vacuum all around your bed often.  Check all used furniture, bedding, or clothes for bedbugs before you bring them into your house.  Remove bird nests and bat perches around  your home.  After you travel, check your clothes and luggage for bedbugs before you bring them into your house. If you find any bedbugs, throw those items away. GET HELP RIGHT AWAY IF:  You have a fever.  You have red bug bites that keep coming back.  You have red bug bites that itch badly.  You have bug bites that cause a skin rash.  You have scratch marks that are red and sore. MAKE SURE YOU:  Understand these instructions.  Will watch your condition.  Will get help right away if you are not doing well or get worse. Document Released: 10/06/2010 Document Revised: 09/13/2011 Document Reviewed: 10/06/2010 [facility] Patient Information 2015 [place], [place]. This  information is not intended to replace advice given to you by your health care provider. Make sure you discuss any questions you have with your health care provider.  Hypertension Hypertension is another name for high blood pressure. High blood pressure forces your heart to work harder to pump blood. A blood pressure reading has two numbers, which includes a higher number over a lower number (example: 110/72). HOME CARE   Have your blood pressure rechecked by your doctor.  Only take medicine as told by your doctor. Follow the directions carefully. The medicine does not work as well if you skip doses. Skipping doses also puts you at risk for problems.  Do not smoke.  Monitor your blood pressure at home as told by your doctor. GET HELP IF:  You think you are having a reaction to the medicine you are taking.  You have repeat headaches or feel dizzy.  You have puffiness (swelling) in your ankles.  You have trouble with your vision. GET HELP RIGHT AWAY IF:   You get a very bad headache and are confused.  You feel weak, numb, or faint.  You get chest or belly (abdominal) pain.  You throw up (vomit).  You cannot breathe very well. MAKE SURE YOU:   Understand these instructions.  Will watch your condition.  Will get help right away if you are not doing well or get worse. Document Released: 12/08/2007 Document Revised: 06/26/2013 Document Reviewed: 04/13/2013 [facility] Patient Information 2015 [place], [place]. This information is not intended to replace advice given to you by your health care provider. Make sure you discuss any questions you have with your health care provider.

## 2013-12-28 NOTE — ED Notes (Signed)
Declined W/C at D/C and was escorted to lobby by RN. 

## 2014-03-21 ENCOUNTER — Emergency Department (HOSPITAL_COMMUNITY): Payer: No Typology Code available for payment source

## 2014-03-21 ENCOUNTER — Encounter (HOSPITAL_COMMUNITY): Payer: Self-pay | Admitting: Emergency Medicine

## 2014-03-21 ENCOUNTER — Emergency Department (HOSPITAL_COMMUNITY)
Admission: EM | Admit: 2014-03-21 | Discharge: 2014-03-21 | Disposition: A | Discharge status: Home / Self Care | Payer: No Typology Code available for payment source | Attending: Emergency Medicine | Admitting: Emergency Medicine

## 2014-03-21 DIAGNOSIS — Z862 Personal history of diseases of the blood and blood-forming organs and certain disorders involving the immune mechanism: Secondary | ICD-10-CM | POA: Insufficient documentation

## 2014-03-21 DIAGNOSIS — IMO0001 Reserved for inherently not codable concepts without codable children: Secondary | ICD-10-CM

## 2014-03-21 DIAGNOSIS — Z792 Long term (current) use of antibiotics: Secondary | ICD-10-CM | POA: Diagnosis not present

## 2014-03-21 DIAGNOSIS — Z8739 Personal history of other diseases of the musculoskeletal system and connective tissue: Secondary | ICD-10-CM | POA: Diagnosis not present

## 2014-03-21 DIAGNOSIS — I10 Essential (primary) hypertension: Secondary | ICD-10-CM | POA: Insufficient documentation

## 2014-03-21 DIAGNOSIS — Z79899 Other long term (current) drug therapy: Secondary | ICD-10-CM | POA: Diagnosis not present

## 2014-03-21 DIAGNOSIS — F17200 Nicotine dependence, unspecified, uncomplicated: Secondary | ICD-10-CM | POA: Diagnosis not present

## 2014-03-21 DIAGNOSIS — Z3202 Encounter for pregnancy test, result negative: Secondary | ICD-10-CM | POA: Insufficient documentation

## 2014-03-21 DIAGNOSIS — R030 Elevated blood-pressure reading, without diagnosis of hypertension: Secondary | ICD-10-CM

## 2014-03-21 DIAGNOSIS — Z8719 Personal history of other diseases of the digestive system: Secondary | ICD-10-CM | POA: Diagnosis not present

## 2014-03-21 DIAGNOSIS — Z880 Allergy status to penicillin: Secondary | ICD-10-CM | POA: Diagnosis not present

## 2014-03-21 DIAGNOSIS — R519 Headache, unspecified: Secondary | ICD-10-CM

## 2014-03-21 DIAGNOSIS — R51 Headache: Secondary | ICD-10-CM

## 2014-03-21 DIAGNOSIS — G43909 Migraine, unspecified, not intractable, without status migrainosus: Secondary | ICD-10-CM | POA: Insufficient documentation

## 2014-03-21 DIAGNOSIS — J45909 Unspecified asthma, uncomplicated: Secondary | ICD-10-CM | POA: Insufficient documentation

## 2014-03-21 LAB — URINALYSIS, ROUTINE W REFLEX MICROSCOPIC
Bilirubin Urine: NEGATIVE
GLUCOSE, UA: NEGATIVE mg/dL
Ketones, ur: NEGATIVE mg/dL
LEUKOCYTES UA: NEGATIVE
Nitrite: NEGATIVE
Protein, ur: NEGATIVE mg/dL
Specific Gravity, Urine: 1.019 (ref 1.005–1.030)
Urobilinogen, UA: 0.2 mg/dL (ref 0.0–1.0)
pH: 7.5 (ref 5.0–8.0)

## 2014-03-21 LAB — CBC WITH DIFFERENTIAL/PLATELET
BASOS ABS: 0 10*3/uL (ref 0.0–0.1)
BASOS PCT: 1 % (ref 0–1)
EOS ABS: 0.3 10*3/uL (ref 0.0–0.7)
EOS PCT: 4 % (ref 0–5)
HCT: 33.2 % — ABNORMAL LOW (ref 36.0–46.0)
Hemoglobin: 11.2 g/dL — ABNORMAL LOW (ref 12.0–15.0)
Lymphocytes Relative: 44 % (ref 12–46)
Lymphs Abs: 2.5 10*3/uL (ref 0.7–4.0)
MCH: 27.2 pg (ref 26.0–34.0)
MCHC: 33.7 g/dL (ref 30.0–36.0)
MCV: 80.6 fL (ref 78.0–100.0)
Monocytes Absolute: 0.3 10*3/uL (ref 0.1–1.0)
Monocytes Relative: 6 % (ref 3–12)
Neutro Abs: 2.6 10*3/uL (ref 1.7–7.7)
Neutrophils Relative %: 45 % (ref 43–77)
PLATELETS: 235 10*3/uL (ref 150–400)
RBC: 4.12 MIL/uL (ref 3.87–5.11)
RDW: 14.2 % (ref 11.5–15.5)
WBC: 5.8 10*3/uL (ref 4.0–10.5)

## 2014-03-21 LAB — URINE MICROSCOPIC-ADD ON

## 2014-03-21 LAB — TROPONIN I: Troponin I: <0.3 ng/mL (ref <0.30)

## 2014-03-21 LAB — BASIC METABOLIC PANEL
ANION GAP: 12 (ref 5–15)
BUN: 10 mg/dL (ref 6–23)
CALCIUM: 9.6 mg/dL (ref 8.4–10.5)
CO2: 27 mEq/L (ref 19–32)
Chloride: 98 mEq/L (ref 96–112)
Creatinine, Ser: 0.61 mg/dL (ref 0.50–1.10)
Glucose, Bld: 149 mg/dL — ABNORMAL HIGH (ref 70–99)
Potassium: 3.4 mEq/L — ABNORMAL LOW (ref 3.7–5.3)
SODIUM: 137 meq/L (ref 137–147)

## 2014-03-21 LAB — PREGNANCY, URINE: PREG TEST UR: NEGATIVE

## 2014-03-21 MED ORDER — HYDROCHLOROTHIAZIDE 12.5 MG PO CAPS
12.5000 mg | ORAL_CAPSULE | Freq: Every day | ORAL | Status: DC
  Filled 2014-03-21: qty 1

## 2014-03-21 MED ORDER — HYDROCHLOROTHIAZIDE 10 MG/ML ORAL SUSPENSION: 25.0000 mg | Freq: Once | ORAL | Status: DC

## 2014-03-21 MED ORDER — LISINOPRIL 10 MG PO TABS
10.0000 mg | ORAL_TABLET | Freq: Once | ORAL | Status: AC
  Administered 2014-03-21: 10 mg via ORAL
  Filled 2014-03-21: qty 1

## 2014-03-21 MED ORDER — HYDROCHLOROTHIAZIDE 25 MG PO TABS: 25.0000 mg | ORAL_TABLET | Freq: Every day | ORAL | Status: DC

## 2014-03-21 MED ORDER — MORPHINE SULFATE 4 MG/ML IJ SOLN
4.0000 mg | Freq: Once | INTRAMUSCULAR | Status: AC
  Administered 2014-03-21: 4 mg via INTRAMUSCULAR
  Filled 2014-03-21: qty 1

## 2014-03-21 MED ORDER — HYDROCHLOROTHIAZIDE 25 MG PO TABS
25.0000 mg | ORAL_TABLET | Freq: Once | ORAL | Status: AC
  Administered 2014-03-21: 25 mg via ORAL
  Filled 2014-03-21: qty 1

## 2014-03-21 NOTE — ED Provider Notes (Signed)
CSN: [id number]     Arrival date & time 03/21/14  1437 History   First MD Initiated Contact with Patient 03/21/14 1710     Chief Complaint  Patient presents with  . Hypertension     (Consider location/radiation/quality/duration/timing/severity/associated sxs/prior Treatment) HPI Comments: The patient is a 48 year old female who has had a history of asthma, hypertension, migraines, presenting in respiratory chief complaint of elevated blood pressure 219/144 and right sided headache for 2 days. The patient reports abrupt onset of right-sided headache while reading. She reports associated eye pain, stating similar discomfort in the past and "knew her pressure was up".  She reports intermittent blurry vision with reading, resolved with blinking rubbing eyes denies visual symptoms at this time. Denies photophobia, aggravating or relieving factors. She reports taking tylenol yesterday with moderate relief of headache for 10-15 minutes.  Patient reports history of hypertension, last lisinopril and HCTZ several years ago, states blood pressure controlled with diet. No LMP recorded.  The history is provided by the patient. No language interpreter was used.    Past Medical History  Diagnosis Date  . Asthma   . Hypertension   . Anemia   . GERD (gastroesophageal reflux disease)   . Headache(784.0)     "1-2 times/month; comes from stress & allergies"  . Migraine     "maybe twice in the last 10 yrs" (10/16/2013)  . Bursitis of left hip   . History of stomach ulcers    Past Surgical History  Procedure Laterality Date  . Tonsillectomy and adenoidectomy  1983  . Cholecystectomy  ~ 2000  . Hernia repair    . Epigastric hernia repair  07/2003  . Bartholin gland cyst excision    . Incision and drainage abscess  10/2005    Bartholin abscess.  . Tubal ligation  1994  . Cesarean section  1989   No family history on file. History  Substance Use Topics  . Smoking status: Current Some Day Smoker -- 1  years    Types: Cigarettes  . Smokeless tobacco: Never Used  . Alcohol Use: Yes     Comment: 10/16/2013 "glass of wine q 6 months or so"   OB History   Grav Para Term Preterm Abortions TAB SAB Ect Mult Living                 Review of Systems  Constitutional: Negative for fever.  Eyes: Positive for visual disturbance. Negative for photophobia.  Gastrointestinal: Negative for nausea, vomiting and abdominal pain.  Genitourinary: Negative for difficulty urinating.  Neurological: Positive for headaches. Negative for dizziness, weakness, light-headedness and numbness.      Allergies  Amoxicillin; Ampicillin; and Montelukast sodium  Home Medications   Prior to Admission medications   Medication Sig Start Date End Date Taking? Authorizing Provider  acetaminophen (TYLENOL) 500 MG tablet Take 500 mg by mouth every 6 (six) hours as needed for moderate pain.    Historical Provider, MD  albuterol (PROVENTIL HFA;VENTOLIN HFA) 108 (90 BASE) MCG/ACT inhaler Inhale 2 puffs into the lungs every 6 (six) hours as needed for wheezing.     Historical Provider, MD  aspirin-acetaminophen-caffeine (EXCEDRIN MIGRAINE) [phone] MG per tablet Take 1 tablet by mouth every 6 (six) hours as needed (pain).     Historical Provider, MD  ciprofloxacin (CIPRO) 500 MG tablet Take 1 tablet (500 mg total) by mouth 2 (two) times daily. X 11 days 10/18/13   Ripudeep Krystal Eaton, MD  docusate (COLACE) 50 MG/5ML liquid Take 10  mLs (100 mg total) by mouth 2 (two) times daily as needed for mild constipation. 10/18/13   Ripudeep Krystal Eaton, MD  hydrOXYzine (ATARAX/VISTARIL) 25 MG tablet Take 1 tablet (25 mg total) by mouth every 6 (six) hours as needed for itching. 12/28/13   Clayton Bibles, PA-C  loratadine (CLARITIN) 5 MG/5ML syrup Take 10 mLs (10 mg total) by mouth daily as needed for allergies or rhinitis. 10/18/13   Ripudeep Krystal Eaton, MD  metroNIDAZOLE (FLAGYL) 50 mg/ml oral suspension Take 10 mLs (500 mg total) by mouth 3 (three) times  daily. X 11 days 10/18/13   Ripudeep Krystal Eaton, MD  neomycin-bacitracin-polymyxin (NEOSPORIN) OINT Apply 1 application topically as needed for irritation or wound care.    Historical Provider, MD  ondansetron (ZOFRAN ODT) 4 MG disintegrating tablet Take 1 tablet (4 mg total) by mouth every 8 (eight) hours as needed for nausea or vomiting. 10/18/13   Ripudeep Krystal Eaton, MD  oxyCODONE (ROXICODONE) 5 MG/5ML solution Take 5 mLs (5 mg total) by mouth every 6 (six) hours as needed for severe pain. 10/18/13   Ripudeep Krystal Eaton, MD  polyethylene glycol (MIRALAX / GLYCOLAX) packet Take 17 g by mouth daily as needed for moderate constipation or severe constipation. 10/18/13   Ripudeep K Rai, MD   BP 156/125  Pulse 84  Temp(Src) 98.9 F (37.2 C) (Oral)  Resp 22  SpO2 99% Physical Exam  Nursing note and vitals reviewed. Constitutional: She is oriented to person, place, and time. She appears well-developed and well-nourished. No distress.  HENT:  Head: Normocephalic and atraumatic.  Eyes: Conjunctivae and EOM are normal. Pupils are equal, round, and reactive to light.  Neck: Neck supple.  Cardiovascular: Normal rate and regular rhythm.   Pulmonary/Chest: Effort normal. No respiratory distress. She has no wheezes. She has no rales.  Abdominal: Soft. There is no tenderness. There is no rebound.  Neurological: She is alert and oriented to person, place, and time. She has normal strength. No cranial nerve deficit or sensory deficit. Coordination abnormal. GCS eye subscore is 4. GCS verbal subscore is 5. GCS motor subscore is 6.  Speech is clear and goal oriented, follows commands Cranial nerves III - XII grossly intact, no facial droop Normal strength in upper and lower extremities bilaterally, strong and equal grip strength Sensation normal to light touch Moves all 4 extremities without ataxia, coordination intact Normal finger to nose and rapid alternating movements No pronator drift Normal gait without assistance.   Skin: Skin is warm and dry. She is not diaphoretic.  Psychiatric: She has a normal mood and affect. Her behavior is normal.    ED Course  Procedures (including critical care time) Labs Review Results for orders placed during the hospital encounter of 03/21/14  CBC WITH DIFFERENTIAL      Result Value Ref Range   WBC 5.8  4.0 - 10.5 K/uL   RBC 4.12  3.87 - 5.11 MIL/uL   Hemoglobin 11.2 (*) 12.0 - 15.0 g/dL   HCT 33.2 (*) 36.0 - 46.0 %   MCV 80.6  78.0 - 100.0 fL   MCH 27.2  26.0 - 34.0 pg   MCHC 33.7  30.0 - 36.0 g/dL   RDW 14.2  11.5 - 15.5 %   Platelets 235  150 - 400 K/uL   Neutrophils Relative % 45  43 - 77 %   Neutro Abs 2.6  1.7 - 7.7 K/uL   Lymphocytes Relative 44  12 - 46 %   Lymphs  Abs 2.5  0.7 - 4.0 K/uL   Monocytes Relative 6  3 - 12 %   Monocytes Absolute 0.3  0.1 - 1.0 K/uL   Eosinophils Relative 4  0 - 5 %   Eosinophils Absolute 0.3  0.0 - 0.7 K/uL   Basophils Relative 1  0 - 1 %   Basophils Absolute 0.0  0.0 - 0.1 K/uL  BASIC METABOLIC PANEL      Result Value Ref Range   Sodium 137  137 - 147 mEq/L   Potassium 3.4 (*) 3.7 - 5.3 mEq/L   Chloride 98  96 - 112 mEq/L   CO2 27  19 - 32 mEq/L   Glucose, Bld 149 (*) 70 - 99 mg/dL   BUN 10  6 - 23 mg/dL   Creatinine, Ser 0.61  0.50 - 1.10 mg/dL   Calcium 9.6  8.4 - 10.5 mg/dL   GFR calc non Af Amer >90  >90 mL/min   GFR calc Af Amer >90  >90 mL/min   Anion gap 12  5 - 15  PREGNANCY, URINE      Result Value Ref Range   Preg Test, Ur NEGATIVE  NEGATIVE  URINALYSIS, ROUTINE W REFLEX MICROSCOPIC      Result Value Ref Range   Color, Urine YELLOW  YELLOW   APPearance CLEAR  CLEAR   Specific Gravity, Urine 1.019  1.005 - 1.030   pH 7.5  5.0 - 8.0   Glucose, UA NEGATIVE  NEGATIVE mg/dL   Hgb urine dipstick MODERATE (*) NEGATIVE   Bilirubin Urine NEGATIVE  NEGATIVE   Ketones, ur NEGATIVE  NEGATIVE mg/dL   Protein, ur NEGATIVE  NEGATIVE mg/dL   Urobilinogen, UA 0.2  0.0 - 1.0 mg/dL   Nitrite NEGATIVE  NEGATIVE    Leukocytes, UA NEGATIVE  NEGATIVE  TROPONIN I      Result Value Ref Range   Troponin I <0.30  <0.30 ng/mL  URINE MICROSCOPIC-ADD ON      Result Value Ref Range   Squamous Epithelial / LPF RARE  RARE   RBC / HPF 3-6  <3 RBC/hpf   Bacteria, UA RARE  RARE   Casts HYALINE CASTS (*) NEGATIVE   Ct Head Wo Contrast  03/21/2014   CLINICAL DATA:  Elevated blood pressure.  Severe headache.  EXAM: CT HEAD WITHOUT CONTRAST  TECHNIQUE: Contiguous axial images were obtained from the base of the skull through the vertex without intravenous contrast.  COMPARISON:  07/15/2009  FINDINGS: No acute intracranial hemorrhage. No focal mass lesion. No CT evidence of acute infarction. No midline shift or mass effect. No hydrocephalus. Basilar cisterns are patent. Paranasal sinuses and mastoid air cells are clear.  IMPRESSION: Normal head CT.   Electronically Signed   By: Suzy Bouchard M.D.   On: 03/21/2014 20:14   Imaging Review No results found.   EKG Interpretation   Date/Time:  Thursday March 21 2014 18:59:23 EDT Ventricular Rate:  69 PR Interval:  188 QRS Duration: 75 QT Interval:  393 QTC Calculation: 421 R Axis:   42 Text Interpretation:  Sinus rhythm Consider left ventricular hypertrophy T  wave flattening No significant change was found Confirmed by Wyvonnia Dusky  MD,  STEPHEN [phone]) on 03/21/2014 7:12:10 PM      MDM   Final diagnoses:  Right-sided headache  Elevated blood pressure   Patient presents with right-sided head ache for 2 days no neurologic deficits, initial blood pressure 1 6/125 on arrival history of hypertension no medications  for several years. EMR shows last BP 165/83 on 11/2013. Plan to control blood pressure and likely resolve symptoms of headache. Patient reports intermittent blurry vision, visual acuity 20/20 bilaterally.  Dr. Melanee Left also evaluated the patient during this counter. CT head negative for acute findings. CBC and BMP without concerning abnormalities,  negative  troponin, EKG without significant change. Patient's blood pressure initially decreasing 170/71, now 192/108 HCTZ given. Plan to discharge home with HCTZ and followup with PCP. Discussed lab results, imaging results, and treatment plan with the patient. Return precautions given. Reports understanding and no other concerns at this time.  Patient is stable for discharge at this time.  Meds given in ED:  Medications  hydrochlorothiazide (HYDRODIURIL) tablet 25 mg (not administered)  lisinopril (PRINIVIL,ZESTRIL) tablet 10 mg (10 mg Oral Given 03/21/14 1757)  morphine 4 MG/ML injection 4 mg (4 mg Intramuscular Given 03/21/14 1931)    New Prescriptions   HYDROCHLOROTHIAZIDE (HYDRODIURIL) 25 MG TABLET    Take 1 tablet (25 mg total) by mouth daily.      Harvie Heck, PA-C 03/22/14 [phone]

## 2014-03-21 NOTE — Discharge Instructions (Signed)
Call for a follow up appointment with a Family or Primary Care Provider for further management of your headaches and elevated blood pressure. Return if symptoms worsen.   Take medication as prescribed.

## 2014-03-21 NOTE — ED Notes (Signed)
Pt reporting sharp pain to the abdomen "because you gave me that medicine on an empty stomach" Pt currently vomiting in trash can. Meal given per MD order.

## 2014-03-21 NOTE — ED Notes (Signed)
Pt reports that her BP today was 210/92 with 10/10 HA. Denies vision changes/blurred vision. Pt has hx of HTN, but denies taking medications currently. Pt states she got flu shot yesterday and "that always makes my blood pressure go high." NAD. AO x4. Neuro intact. Denies CP.

## 2014-03-22 NOTE — ED Provider Notes (Signed)
Medical screening examination/treatment/procedure(s) were conducted as a shared visit with non-physician practitioner(s) and myself.  I personally evaluated the patient during the encounter.  2 day history of headache, checked BP and found it to be elevated.  Has not taken BP meds for years. No blurry vision or double vision. No focal weakness, numbness, tingling.  Gradual onset headache, denies thunderclap. Reproducible central chest soreness that has been constant for past 2 days. Low suspicion for ACS, SAH, meningitis, temporal arteritis.  CN 2-12 intact, no ataxia on finger to nose, no nystagmus, 5/5 strength throughout, no pronator drift, Romberg negative, normal gait.    EKG Interpretation   Date/Time:  Thursday March 21 2014 18:59:23 EDT Ventricular Rate:  69 PR Interval:  188 QRS Duration: 75 QT Interval:  393 QTC Calculation: 421 R Axis:   42 Text Interpretation:  Sinus rhythm Consider left ventricular hypertrophy T  wave flattening No significant change was found Confirmed by Mecca Barga  MD,  Milania Haubner ([id number]) on 03/21/2014 7:12:10 PM      BP 147/76  Pulse 83  Temp(Src) 98.9 F (37.2 C) (Oral)  Resp 18  SpO2 100%   Ezequiel Essex, MD 03/22/14 0234

## 2014-08-04 ENCOUNTER — Inpatient Hospital Stay (HOSPITAL_COMMUNITY)
Admission: EM | Admit: 2014-08-04 | Discharge: 2014-08-06 | DRG: 392 | Disposition: A | Discharge status: Home / Self Care | Payer: 59 | Attending: Internal Medicine | Admitting: Internal Medicine

## 2014-08-04 ENCOUNTER — Encounter (HOSPITAL_COMMUNITY): Payer: Self-pay

## 2014-08-04 ENCOUNTER — Emergency Department (HOSPITAL_COMMUNITY): Payer: No Typology Code available for payment source

## 2014-08-04 DIAGNOSIS — E876 Hypokalemia: Secondary | ICD-10-CM | POA: Diagnosis present

## 2014-08-04 DIAGNOSIS — E669 Obesity, unspecified: Secondary | ICD-10-CM | POA: Diagnosis present

## 2014-08-04 DIAGNOSIS — E785 Hyperlipidemia, unspecified: Secondary | ICD-10-CM | POA: Diagnosis present

## 2014-08-04 DIAGNOSIS — K5792 Diverticulitis of intestine, part unspecified, without perforation or abscess without bleeding: Secondary | ICD-10-CM | POA: Diagnosis present

## 2014-08-04 DIAGNOSIS — D509 Iron deficiency anemia, unspecified: Secondary | ICD-10-CM | POA: Diagnosis present

## 2014-08-04 DIAGNOSIS — R7301 Impaired fasting glucose: Secondary | ICD-10-CM | POA: Diagnosis present

## 2014-08-04 DIAGNOSIS — J45909 Unspecified asthma, uncomplicated: Secondary | ICD-10-CM | POA: Diagnosis present

## 2014-08-04 DIAGNOSIS — D649 Anemia, unspecified: Secondary | ICD-10-CM

## 2014-08-04 DIAGNOSIS — F17210 Nicotine dependence, cigarettes, uncomplicated: Secondary | ICD-10-CM | POA: Diagnosis present

## 2014-08-04 DIAGNOSIS — K5900 Constipation, unspecified: Secondary | ICD-10-CM | POA: Diagnosis present

## 2014-08-04 DIAGNOSIS — R7303 Prediabetes: Secondary | ICD-10-CM | POA: Diagnosis present

## 2014-08-04 DIAGNOSIS — E66811 Obesity, class 1: Secondary | ICD-10-CM | POA: Diagnosis present

## 2014-08-04 DIAGNOSIS — R739 Hyperglycemia, unspecified: Secondary | ICD-10-CM

## 2014-08-04 DIAGNOSIS — Z87891 Personal history of nicotine dependence: Secondary | ICD-10-CM

## 2014-08-04 DIAGNOSIS — K219 Gastro-esophageal reflux disease without esophagitis: Secondary | ICD-10-CM | POA: Diagnosis present

## 2014-08-04 DIAGNOSIS — R079 Chest pain, unspecified: Secondary | ICD-10-CM

## 2014-08-04 DIAGNOSIS — I10 Essential (primary) hypertension: Secondary | ICD-10-CM | POA: Diagnosis present

## 2014-08-04 DIAGNOSIS — K5732 Diverticulitis of large intestine without perforation or abscess without bleeding: Principal | ICD-10-CM | POA: Diagnosis present

## 2014-08-04 LAB — RETICULOCYTES
RBC.: 4.01 MIL/uL (ref 3.87–5.11)
RETIC COUNT ABSOLUTE: 52.1 10*3/uL (ref 19.0–186.0)
Retic Ct Pct: 1.3 % (ref 0.4–3.1)

## 2014-08-04 LAB — URINE MICROSCOPIC-ADD ON

## 2014-08-04 LAB — CBC WITH DIFFERENTIAL/PLATELET
BASOS ABS: 0 10*3/uL (ref 0.0–0.1)
Basophils Relative: 0 % (ref 0–1)
EOS PCT: 1 % (ref 0–5)
Eosinophils Absolute: 0.1 10*3/uL (ref 0.0–0.7)
HCT: 33.4 % — ABNORMAL LOW (ref 36.0–46.0)
Hemoglobin: 11.2 g/dL — ABNORMAL LOW (ref 12.0–15.0)
Lymphocytes Relative: 21 % (ref 12–46)
Lymphs Abs: 2.2 10*3/uL (ref 0.7–4.0)
MCH: 26.7 pg (ref 26.0–34.0)
MCHC: 33.5 g/dL (ref 30.0–36.0)
MCV: 79.5 fL (ref 78.0–100.0)
Monocytes Absolute: 0.5 10*3/uL (ref 0.1–1.0)
Monocytes Relative: 5 % (ref 3–12)
NEUTROS PCT: 73 % (ref 43–77)
Neutro Abs: 7.8 10*3/uL — ABNORMAL HIGH (ref 1.7–7.7)
PLATELETS: 230 10*3/uL (ref 150–400)
RBC: 4.2 MIL/uL (ref 3.87–5.11)
RDW: 14 % (ref 11.5–15.5)
WBC: 10.6 10*3/uL — ABNORMAL HIGH (ref 4.0–10.5)

## 2014-08-04 LAB — I-STAT CG4 LACTIC ACID, ED: Lactic Acid, Venous: 2.01 mmol/L (ref 0.5–2.0)

## 2014-08-04 LAB — COMPREHENSIVE METABOLIC PANEL
ALBUMIN: 3.6 g/dL (ref 3.5–5.2)
ALT: 11 U/L (ref 0–35)
AST: 21 U/L (ref 0–37)
Alkaline Phosphatase: 53 U/L (ref 39–117)
Anion gap: 7 (ref 5–15)
BUN: 8 mg/dL (ref 6–23)
CO2: 24 mmol/L (ref 19–32)
Calcium: 9.6 mg/dL (ref 8.4–10.5)
Chloride: 107 mmol/L (ref 96–112)
Creatinine, Ser: 0.68 mg/dL (ref 0.50–1.10)
Glucose, Bld: 132 mg/dL — ABNORMAL HIGH (ref 70–99)
Potassium: 3.2 mmol/L — ABNORMAL LOW (ref 3.5–5.1)
Sodium: 138 mmol/L (ref 135–145)
Total Bilirubin: 0.6 mg/dL (ref 0.3–1.2)
Total Protein: 6.8 g/dL (ref 6.0–8.3)

## 2014-08-04 LAB — URINALYSIS, ROUTINE W REFLEX MICROSCOPIC
Bilirubin Urine: NEGATIVE
GLUCOSE, UA: NEGATIVE mg/dL
Ketones, ur: NEGATIVE mg/dL
Leukocytes, UA: NEGATIVE
Nitrite: NEGATIVE
PH: 8.5 — AB (ref 5.0–8.0)
Protein, ur: NEGATIVE mg/dL
Specific Gravity, Urine: 1.015 (ref 1.005–1.030)
Urobilinogen, UA: 1 mg/dL (ref 0.0–1.0)

## 2014-08-04 LAB — I-STAT BETA HCG BLOOD, ED (MC, WL, AP ONLY): I-stat hCG, quantitative: <5 m[IU]/mL (ref <5)

## 2014-08-04 LAB — MAGNESIUM: MAGNESIUM: 1.6 mg/dL (ref 1.5–2.5)

## 2014-08-04 LAB — APTT: APTT: 32 s (ref 24–37)

## 2014-08-04 LAB — PROTIME-INR
INR: 1.09 (ref 0.00–1.49)
Prothrombin Time: 14.2 seconds (ref 11.6–15.2)

## 2014-08-04 LAB — LACTIC ACID, PLASMA: Lactic Acid, Venous: 1.1 mmol/L (ref 0.5–2.0)

## 2014-08-04 LAB — TECHNOLOGIST SMEAR REVIEW

## 2014-08-04 LAB — PHOSPHORUS: PHOSPHORUS: 3.7 mg/dL (ref 2.3–4.6)

## 2014-08-04 MED ORDER — PROMETHAZINE HCL 25 MG/ML IJ SOLN
12.5000 mg | Freq: Four times a day (QID) | INTRAMUSCULAR | Status: DC | PRN
  Administered 2014-08-04: 12.5 mg via INTRAVENOUS
  Filled 2014-08-04: qty 1

## 2014-08-04 MED ORDER — SODIUM CHLORIDE 0.9 % IV SOLN
INTRAVENOUS | Status: DC
  Administered 2014-08-04 – 2014-08-05 (×3): via INTRAVENOUS

## 2014-08-04 MED ORDER — ACETAMINOPHEN 650 MG RE SUPP
650.0000 mg | Freq: Once | RECTAL | Status: AC
  Administered 2014-08-04: 650 mg via RECTAL
  Filled 2014-08-04: qty 1

## 2014-08-04 MED ORDER — HYDROMORPHONE HCL 1 MG/ML IJ SOLN
1.0000 mg | Freq: Once | INTRAMUSCULAR | Status: AC
Start: 2014-08-04 — End: 2014-08-04
  Administered 2014-08-04: 1 mg via INTRAVENOUS
  Filled 2014-08-04: qty 1

## 2014-08-04 MED ORDER — IPRATROPIUM-ALBUTEROL 0.5-2.5 (3) MG/3ML IN SOLN
3.0000 mL | RESPIRATORY_TRACT | Status: DC | PRN
  Administered 2014-08-04: 3 mL via RESPIRATORY_TRACT
  Filled 2014-08-04: qty 3

## 2014-08-04 MED ORDER — IOHEXOL 300 MG/ML  SOLN
100.0000 mL | Freq: Once | INTRAMUSCULAR | Status: AC | PRN
  Administered 2014-08-04: 100 mL via INTRAVENOUS

## 2014-08-04 MED ORDER — ALBUTEROL SULFATE (2.5 MG/3ML) 0.083% IN NEBU: 2.5000 mg | INHALATION_SOLUTION | Freq: Four times a day (QID) | RESPIRATORY_TRACT | Status: DC | PRN

## 2014-08-04 MED ORDER — HYDROMORPHONE HCL 1 MG/ML IJ SOLN
1.0000 mg | Freq: Once | INTRAMUSCULAR | Status: AC
  Administered 2014-08-04: 1 mg via INTRAVENOUS
  Filled 2014-08-04: qty 1

## 2014-08-04 MED ORDER — ONDANSETRON HCL 4 MG/2ML IJ SOLN
4.0000 mg | Freq: Once | INTRAMUSCULAR | Status: AC
  Administered 2014-08-04: 4 mg via INTRAVENOUS
  Filled 2014-08-04: qty 2

## 2014-08-04 MED ORDER — ONDANSETRON HCL 4 MG/2ML IJ SOLN
4.0000 mg | Freq: Four times a day (QID) | INTRAMUSCULAR | Status: DC | PRN
  Administered 2014-08-04: 4 mg via INTRAVENOUS
  Filled 2014-08-04: qty 2

## 2014-08-04 MED ORDER — METRONIDAZOLE IN NACL 5-0.79 MG/ML-% IV SOLN
500.0000 mg | Freq: Three times a day (TID) | INTRAVENOUS | Status: DC
  Administered 2014-08-04 – 2014-08-05 (×3): 500 mg via INTRAVENOUS
  Filled 2014-08-04 (×5): qty 100

## 2014-08-04 MED ORDER — CIPROFLOXACIN IN D5W 400 MG/200ML IV SOLN
400.0000 mg | Freq: Once | INTRAVENOUS | Status: AC
  Administered 2014-08-04: 400 mg via INTRAVENOUS
  Filled 2014-08-04: qty 200

## 2014-08-04 MED ORDER — CIPROFLOXACIN IN D5W 400 MG/200ML IV SOLN
400.0000 mg | Freq: Two times a day (BID) | INTRAVENOUS | Status: DC
  Administered 2014-08-04 – 2014-08-05 (×2): 400 mg via INTRAVENOUS
  Filled 2014-08-04 (×3): qty 200

## 2014-08-04 MED ORDER — HYDROMORPHONE HCL 1 MG/ML IJ SOLN
1.0000 mg | INTRAMUSCULAR | Status: DC | PRN
  Administered 2014-08-04 – 2014-08-05 (×3): 1 mg via INTRAVENOUS
  Filled 2014-08-04 (×3): qty 1

## 2014-08-04 MED ORDER — IOHEXOL 300 MG/ML  SOLN
25.0000 mL | Freq: Once | INTRAMUSCULAR | Status: AC | PRN
  Administered 2014-08-04: 25 mL via ORAL

## 2014-08-04 MED ORDER — METRONIDAZOLE IN NACL 5-0.79 MG/ML-% IV SOLN
500.0000 mg | Freq: Once | INTRAVENOUS | Status: AC
  Administered 2014-08-04: 500 mg via INTRAVENOUS
  Filled 2014-08-04: qty 100

## 2014-08-04 MED ORDER — POTASSIUM CHLORIDE 10 MEQ/100ML IV SOLN
10.0000 meq | INTRAVENOUS | Status: AC
  Administered 2014-08-04 (×2): 10 meq via INTRAVENOUS
  Filled 2014-08-04: qty 100

## 2014-08-04 MED ORDER — HYDROMORPHONE HCL 1 MG/ML IJ SOLN
1.0000 mg | INTRAMUSCULAR | Status: DC | PRN
  Administered 2014-08-04: 1 mg via INTRAVENOUS
  Filled 2014-08-04: qty 1

## 2014-08-04 MED ORDER — SODIUM CHLORIDE 0.9 % IV BOLUS (SEPSIS)
1000.0000 mL | Freq: Once | INTRAVENOUS | Status: AC
  Administered 2014-08-04: 1000 mL via INTRAVENOUS

## 2014-08-04 MED ORDER — ENOXAPARIN SODIUM 40 MG/0.4ML ~~LOC~~ SOLN
40.0000 mg | SUBCUTANEOUS | Status: DC
  Administered 2014-08-04 – 2014-08-06 (×3): 40 mg via SUBCUTANEOUS
  Filled 2014-08-04 (×3): qty 0.4

## 2014-08-04 NOTE — ED Provider Notes (Signed)
CSN: [id number]     Arrival date & time 08/04/14  0737 History   First MD Initiated Contact with Patient 08/04/14 [phone]     Chief Complaint  Patient presents with  . Abdominal Pain     Patient is a 50 y.o. female presenting with abdominal pain. The history is provided by the patient. No language interpreter was used.  Abdominal Pain  Ms. Kiker presents for evaluation of left lower quadrant pain. Her pain started around 4:30 in the morning and is described as severe and sharp in nature. The pain is constant and nonradiating. She's had multiple episodes of emesis. She denies any fevers. She reports constipation and her last vomiting was at 4:30 this morning and describes hard stools. She denies any dysuria. She's had a history of tubal ligation and abdominal hernia repair as well as cholecystectomy.  Past Medical History  Diagnosis Date  . Asthma   . Hypertension   . Anemia   . GERD (gastroesophageal reflux disease)   . Headache(784.0)     "1-2 times/month; comes from stress & allergies"  . Migraine     "maybe twice in the last 10 yrs" (10/16/2013)  . Bursitis of left hip   . History of stomach ulcers    Past Surgical History  Procedure Laterality Date  . Tonsillectomy and adenoidectomy  1983  . Cholecystectomy  ~ 2000  . Hernia repair    . Epigastric hernia repair  07/2003  . Bartholin gland cyst excision    . Incision and drainage abscess  10/2005    Bartholin abscess.  . Tubal ligation  1994  . Cesarean section  1989   No family history on file. History  Substance Use Topics  . Smoking status: Current Some Day Smoker -- 1 years    Types: Cigarettes  . Smokeless tobacco: Never Used  . Alcohol Use: Yes     Comment: 10/16/2013 "glass of wine q 6 months or so"   OB History    No data available     Review of Systems  Gastrointestinal: Positive for abdominal pain.  All other systems reviewed and are negative.     Allergies  Amoxicillin; Ampicillin; and Montelukast  sodium  Home Medications   Prior to Admission medications   Medication Sig Start Date End Date Taking? Authorizing Provider  acetaminophen (TYLENOL) 500 MG tablet Take 500 mg by mouth every 6 (six) hours as needed for moderate pain.    Historical Provider, MD  albuterol (PROVENTIL HFA;VENTOLIN HFA) 108 (90 BASE) MCG/ACT inhaler Inhale 2 puffs into the lungs every 6 (six) hours as needed for wheezing.     Historical Provider, MD  hydrochlorothiazide (HYDRODIURIL) 25 MG tablet Take 1 tablet (25 mg total) by mouth daily. 03/21/14   Harvie Heck, PA-C   There were no vitals taken for this visit. Physical Exam  Constitutional: She is oriented to person, place, and time. She appears well-developed and well-nourished.  Moderate distress  HENT:  Head: Normocephalic and atraumatic.  Cardiovascular: Normal rate and regular rhythm.   No murmur heard. 2+ femoral pulses bilaterally  Pulmonary/Chest: Effort normal and breath sounds normal. No respiratory distress.  Abdominal:  Mildly distended with decreased bowel sounds there is moderate diffuse tenderness with voluntary guarding and no rebound tenderness. Tenderness is greatest over the left lower quadrant.  Genitourinary:  External exam is normal. No inguinal hernia.  Musculoskeletal: She exhibits no edema or tenderness.  Neurological: She is alert and oriented to person, place, and time.  Skin: Skin is warm and dry.  Psychiatric: She has a normal mood and affect. Her behavior is normal.  Nursing note and vitals reviewed.   ED Course  Procedures (including critical care time) Labs Review Labs Reviewed  COMPREHENSIVE METABOLIC PANEL - Abnormal; Notable for the following:    Potassium 3.2 (*)    Glucose, Bld 132 (*)    All other components within normal limits  CBC WITH DIFFERENTIAL/PLATELET - Abnormal; Notable for the following:    WBC 10.6 (*)    Hemoglobin 11.2 (*)    HCT 33.4 (*)    Neutro Abs 7.8 (*)    All other components within  normal limits  URINALYSIS, ROUTINE W REFLEX MICROSCOPIC - Abnormal; Notable for the following:    pH 8.5 (*)    Hgb urine dipstick MODERATE (*)    All other components within normal limits  URINE MICROSCOPIC-ADD ON - Abnormal; Notable for the following:    Squamous Epithelial / LPF FEW (*)    Bacteria, UA FEW (*)    All other components within normal limits  I-STAT CG4 LACTIC ACID, ED - Abnormal; Notable for the following:    Lactic Acid, Venous 2.01 (*)    All other components within normal limits  MRSA PCR SCREENING  RETICULOCYTES  APTT  PROTIME-INR  MAGNESIUM  PHOSPHORUS  LACTIC ACID, PLASMA  VITAMIN B12  FOLATE  IRON AND TIBC  FERRITIN  OCCULT BLOOD X 1 CARD TO LAB, STOOL  HEMOGLOBIN A1C  TECHNOLOGIST SMEAR REVIEW  HIV ANTIBODY (ROUTINE TESTING)  LIPID PANEL  I-STAT BETA HCG BLOOD, ED ([facility], [facility], [facility] ONLY)    Imaging Review Ct Abdomen Pelvis W Contrast  08/04/2014   CLINICAL DATA:  Left lower quadrant abdominal pain.  EXAM: CT ABDOMEN AND PELVIS WITH CONTRAST  TECHNIQUE: Multidetector CT imaging of the abdomen and pelvis was performed using the standard protocol following bolus administration of intravenous contrast.  CONTRAST:  150mL OMNIPAQUE IOHEXOL 300 MG/ML  SOLN  COMPARISON:  CT scan of [place] 13, 2015.  FINDINGS: Visualized lung bases appear normal. No significant osseous abnormality is noted.  Status post cholecystectomy. Small sliding-type hiatal hernia is noted. The liver, spleen and pancreas appear normal. Adrenal glands appear normal. No hydronephrosis or renal obstruction is noted. Stable right renal cysts are noted. Cortical scarring of upper pole of right kidney is again noted. No hydronephrosis or renal obstruction is noted.  There is no evidence of bowel obstruction. Appendix appears normal. Acute diverticulitis is noted at the junction of the descending and sigmoid colon. No definite abscess is noted at this time. Uterus and urinary bladder appear normal. No  significant adenopathy is noted.  IMPRESSION: Focal acute diverticulitis is noted at the junction of the descending and sigmoid colon. No abscess is seen at this time.   Electronically Signed   By: Sabino Dick M.D.   On: 08/04/2014 11:28     EKG Interpretation None      MDM   Final diagnoses:  Acute diverticulitis    Patient here for evaluation of left lower quadrant abdominal pain. On initial examination patient with peritoneal findings. CT scan obtained which is consistent with acute diverticulitis without evidence of abscess or perforation.  Given patient's continued pain in the emergency department and history of vomiting at home discussed with medicine regarding admission for IV antibiotics and symptom control.  Quintella Reichert, MD 08/04/14 [phone]

## 2014-08-04 NOTE — ED Notes (Signed)
Pt is in a gown and on the monitor. 

## 2014-08-04 NOTE — H&P (Signed)
Date: 08/04/2014               Patient Name:  [name] MRN: [id number]  DOB: Dec 13, 1965 Age / Sex: 49 y.o., female   PCP: Wallene Dales, MD         Medical Service: Internal Medicine Teaching Service         Attending Physician: Dr. Karren Cobble, MD    First Contact: Primus Bravo, MS3 Dr. Karle Starch Moding Pager: [phone]  Second Contact: Dr. Juluis Mire Pager: [phone]       After Hours (After 5p/  First Contact Pager: [phone]  weekends / holidays): Second Contact Pager: [phone]   Chief Complaint: "pain in my left side, at the bottom of my stomach"  History of Present Illness: [name] is a 49 yo female with a PMHx of diverticulitis, asthma, anemia, and hypertension who presented with 2 days of abdominal pain. She states that the pain began Friday in her LLQ while she was running errands. The pain is described as constant, "something pushing," and travels down her left leg. It has no alleviating factors but worsens with movement. Since the pain began, she has had 4 episodes of non-bilious, non-bloody vomiting and constipation (only one small, firm BM this morning). She also reports intermittent leg cramps,  headache (relieved with ibuprofen), chest pain, shortness of breath (chest feels tight from asthma), urinary urgency. Specifically, the chest pain has been present since [name], is localized to the left side, lateral to the mid-clavicular line, occurs randomly (not acutely related to abdominal pain), and is described as "copngestion." She denies diarrhea, bloody/dark stools, fevers, weight change.  She had a similar episode in [name] 2015 when she was first diagnosed with diverticulitis. She did not have scheduled follow-up and no colonoscopy was performed. She has had multiple abdominal/pelvic surgeries including C-section (1989), tubal ligation (1994), cholecystectomy (2000), and hernia repair (2005).  Pt was brought to ED via EMS, who reported HR of 114. In  the ED, she was given $RemoveB'1mg'TEoDzaXz$  Dilaudid x2, started on $RemoveBe'400mg'EJIOZjhYf$  Cipro and $RemoveB'500mg'edFcVaPQ$  Flagyl, given 1L bolus NS, and made NPO. CT scan showed focal acute diverticulitis at the junction of the descending and sigmoid colon and no abscesses. Labs included CBC, CMP, UA, and pregnancy test (negative).   Meds: No current facility-administered medications on file prior to encounter.   Current Outpatient Prescriptions on File Prior to Encounter  Medication Sig Dispense Refill  . acetaminophen (TYLENOL) 500 MG tablet Take 500 mg by mouth every 6 (six) hours as needed for moderate pain.    Marland Kitchen albuterol (PROVENTIL HFA;VENTOLIN HFA) 108 (90 BASE) MCG/ACT inhaler Inhale 2 puffs into the lungs every 6 (six) hours as needed for wheezing.     . hydrochlorothiazide (HYDRODIURIL) 25 MG tablet Take 1 tablet (25 mg total) by mouth daily. 30 tablet 0   *pt states that she is currently not taking meds above as she recently switched insurance and needs to see PCP  Allergies: Allergies as of 08/04/2014 - Review Complete 08/04/2014  Allergen Reaction Noted  . Amoxicillin Rash 10/15/2013  . Ampicillin Rash 05/24/2011  . Montelukast sodium Rash 05/24/2011   Past Medical History  Diagnosis Date  . Asthma   . Hypertension   . Anemia   . GERD (gastroesophageal reflux disease)   . Headache(784.0)     "1-2 times/month; comes from stress & allergies"  . Migraine     "maybe twice in the last 10 yrs" (10/16/2013)  . Bursitis  of left hip   . History of stomach ulcers    Past Surgical History  Procedure Laterality Date  . Tonsillectomy and adenoidectomy  1983  . Cholecystectomy  ~ 2000  . Hernia repair    . Epigastric hernia repair  07/2003  . Bartholin gland cyst excision    . Incision and drainage abscess  10/2005    Bartholin abscess.  . Tubal ligation  1994  . 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  She lives at home alone (her brother sometimes stays there). She reports rare alcohol use (5x per year). She denies tobacco or recreational drug use.  She is currently sexually active with one partner (boyfirend), has never had a STI, and gets tested regularly. With respect to diet, she is a "picky eater," never eats full meals, and only sometimes cooks at home. She does not exercise, but she is on her feet a lot at work.     Review of Systems: General: chills. Denies fever, weight change HEENT: headache (nonfocal), dizziness, seasonal allergies Cardiac: chest pain (L. Side, lateral to midclavicular line), heart racing with vomiting Respiratory: shortness of breath, asthma GI: pain, nausea, vomiting, constipation (only one small, firm BM this morning). denies diarrhea, bloody/dark stools Urinary: urinary urgency; denies increased frequency or dysuria Gyn: LMP 07/24/14, regular cycle, cramping Neurologic: numbness, occasional tremor in right hand since slipped disc last year Musculoskeletal: intermittent leg cramps Vascular: bilateral ankle swelling  Physical Exam: Blood pressure 152/82, pulse 85, temperature 99.1 F (37.3 C), temperature source Oral, resp. rate 21, height $RemoveBe'5\' 1"'RgVkeQtEJ$  (1.549 m), weight 83.915 kg (185 lb), last menstrual period 07/24/2014, SpO2 100 %.   General: alert and cooperative, lying in bed, NAD. Appears stated age, moderately obese.  Head: normocephalic, atraumatic Eyes: PERRLA, EOMI, anicteric sclera Ears: hearing grossly intact Nose: nares patent, no discharge Throat: nonerythematous, uvula midline, normal soft palate, MMM Neck: supple, no adenopathy Lungs: CTAB, normal work of breathing, no wheezes Heart: RRR, normal S1 S2, no m/r/g Abdomen: tender to palpation in LLQ. Soft, nondistended, obese, well-healed midline/periumbilicval surgical scars. Extremities:  calves tender to palpation bilaterally. No cyanosis or edema. Radial pulses 2+ and symmetric. DP pulses 1+ bilaterally. Neuro: AOx3    Lab results: Recent Results (from the past 2160 hour(s))  Comprehensive metabolic panel     Status: Abnormal   Collection Time: 08/04/14  9:01 AM  Result Value Ref Range   Sodium 138 135 - 145 mmol/L   Potassium 3.2 (L) 3.5 - 5.1 mmol/L   Chloride 107 96 - 112 mmol/L   CO2 24 19 - 32 mmol/L   Glucose, Bld 132 (H) 70 - 99 mg/dL   BUN 8 6 - 23 mg/dL   Creatinine, Ser 0.68 0.50 - 1.10 mg/dL   Calcium 9.6 8.4 - 10.5 mg/dL   Total Protein 6.8 6.0 - 8.3 g/dL   Albumin 3.6 3.5 - 5.2 g/dL   AST 21 0 - 37 U/L   ALT 11 0 - 35 U/L   Alkaline Phosphatase 53 39 - 117 U/L   Total Bilirubin 0.6 0.3 - 1.2 mg/dL   GFR calc non Af Amer >90 >90 mL/min   GFR calc Af Amer >90 >90 mL/min    Comment: (NOTE) The eGFR has been calculated using  the CKD EPI equation. This calculation has not been validated in all clinical situations. eGFR's persistently <90 mL/min signify possible Chronic Kidney Disease.    Anion gap 7 5 - 15  CBC with Differential     Status: Abnormal   Collection Time: 08/04/14  9:01 AM  Result Value Ref Range   WBC 10.6 (H) 4.0 - 10.5 K/uL   RBC 4.20 3.87 - 5.11 MIL/uL   Hemoglobin 11.2 (L) 12.0 - 15.0 g/dL   HCT 33.4 (L) 36.0 - 46.0 %   MCV 79.5 78.0 - 100.0 fL   MCH 26.7 26.0 - 34.0 pg   MCHC 33.5 30.0 - 36.0 g/dL   RDW 14.0 11.5 - 15.5 %   Platelets 230 150 - 400 K/uL   Neutrophils Relative % 73 43 - 77 %   Neutro Abs 7.8 (H) 1.7 - 7.7 K/uL   Lymphocytes Relative 21 12 - 46 %   Lymphs Abs 2.2 0.7 - 4.0 K/uL   Monocytes Relative 5 3 - 12 %   Monocytes Absolute 0.5 0.1 - 1.0 K/uL   Eosinophils Relative 1 0 - 5 %   Eosinophils Absolute 0.1 0.0 - 0.7 K/uL   Basophils Relative 0 0 - 1 %   Basophils Absolute 0.0 0.0 - 0.1 K/uL  I-Stat beta hCG blood, ED     Status: None   Collection Time: 08/04/14  9:27 AM  Result Value Ref Range    I-stat hCG, quantitative <5.0 <5 mIU/mL   Comment 3            Comment:   GEST. AGE      CONC.  (mIU/mL)   <=1 WEEK        5 - 50     2 WEEKS       50 - 500     3 WEEKS       100 - 10,000     4 WEEKS     1,000 - 30,000        FEMALE AND NON-PREGNANT FEMALE:     LESS THAN 5 mIU/mL   I-Stat CG4 Lactic Acid, ED     Status: Abnormal   Collection Time: 08/04/14  9:29 AM  Result Value Ref Range   Lactic Acid, Venous 2.01 (HH) 0.5 - 2.0 mmol/L   Comment NOTIFIED PHYSICIAN   Urinalysis, Routine w reflex microscopic     Status: Abnormal   Collection Time: 08/04/14 11:19 AM  Result Value Ref Range   Color, Urine YELLOW YELLOW   APPearance CLEAR CLEAR   Specific Gravity, Urine 1.015 1.005 - 1.030   pH 8.5 (H) 5.0 - 8.0   Glucose, UA NEGATIVE NEGATIVE mg/dL   Hgb urine dipstick MODERATE (A) NEGATIVE   Bilirubin Urine NEGATIVE NEGATIVE   Ketones, ur NEGATIVE NEGATIVE mg/dL   Protein, ur NEGATIVE NEGATIVE mg/dL   Urobilinogen, UA 1.0 0.0 - 1.0 mg/dL   Nitrite NEGATIVE NEGATIVE   Leukocytes, UA NEGATIVE NEGATIVE  Urine microscopic-add on     Status: Abnormal   Collection Time: 08/04/14 11:19 AM  Result Value Ref Range   Squamous Epithelial / LPF FEW (A) RARE   WBC, UA 0-2 <3 WBC/hpf   RBC / HPF 7-10 <3 RBC/hpf   Bacteria, UA FEW (A) RARE  Reticulocytes     Status: None   Collection Time: 08/04/14  3:40 PM  Result Value Ref Range   Retic Ct Pct 1.3 0.4 - 3.1 %   RBC. 4.01 3.87 -  5.11 MIL/uL   Retic Count, Manual 52.1 19.0 - 186.0 K/uL    Imaging results:  Ct Abdomen Pelvis W Contrast  08/04/2014   CLINICAL DATA:  Left lower quadrant abdominal pain.  EXAM: CT ABDOMEN AND PELVIS WITH CONTRAST  TECHNIQUE: Multidetector CT imaging of the abdomen and pelvis was performed using the standard protocol following bolus administration of intravenous contrast.  CONTRAST:  185mL OMNIPAQUE IOHEXOL 300 MG/ML  SOLN  COMPARISON:  CT scan of [place] 13, 2015.  FINDINGS: Visualized lung bases appear normal.  No significant osseous abnormality is noted.  Status post cholecystectomy. Small sliding-type hiatal hernia is noted. The liver, spleen and pancreas appear normal. Adrenal glands appear normal. No hydronephrosis or renal obstruction is noted. Stable right renal cysts are noted. Cortical scarring of upper pole of right kidney is again noted. No hydronephrosis or renal obstruction is noted.  There is no evidence of bowel obstruction. Appendix appears normal. Acute diverticulitis is noted at the junction of the descending and sigmoid colon. No definite abscess is noted at this time. Uterus and urinary bladder appear normal. No significant adenopathy is noted.  IMPRESSION: Focal acute diverticulitis is noted at the junction of the descending and sigmoid colon. No abscess is seen at this time.   Electronically Signed   By: Sabino Dick M.D.   On: 08/04/2014 11:28    Other results: EKG: normal EKG, normal sinus rhythm.08/04/2014 15:43  Assessment & Plan by Problem:  Ms. Shelma Longanecker is a 49 yo female with a PMHx of diverticulitis, asthma, anemia, and hypertension who presented with 2 days of abdominal pain. Pain is likely due to acute diverticulitis. Differential includes peritonitis, ectopic pregnancy, ovarian torsion, PID, hernia, and neoplasm. Ectopic pregnancy is unlikely given negative pregnancy test. PID unlikely given sexual history and no recent/past STI exposure. Ovarian torsion less likely given onset of pain without strenuous activity. Hernia and neoplasm unlikely given negative CT findings.  Uncomplicated Acute Diverticulitis: Pt presents with two days of abdominal pain, likely due to diverticulitis. Pt had similar episode in [place] of last year, diagnosed as diverticulitis. Pt missed follow-up appointments. CT today (1/31) revealed  CT scan showed focal acute diverticulitis at the junction of the descending and sigmoid colon and no abscesses.  -- continue antibiotics: Cipro and Flagyl -- continue  1mg  Dilaudid q4H PRN for pain -- continue alternating 4mg  Zofran q6H PRN, 12.5mg  IV Phenergan q6H for nausea -- plan for outpt GI follow-up for colonoscopy following discharge  Chest Pain:  Pt reports left-sided chest pain, described as "congestion." Pain possible related to asthma, as pt was also asking for a breathing treatment. EKG in ED was normal. Pt hemodynamically stable; thus PE, MI less likely. -- repeat EKG -- ipratropium-albuterol (Duoneb) q4H PRN  Asthma: pt acutely complains of chest tightness. O2 sat 100% on room air, no wheezes on exam. -- Duonebs as described above  Anemia: baseline Hgb 10-11.2 over the past year. Hgb 11.2 today. MCV 79 on admission. Possible iron deficiency anemia. -- repeat CBC in AM -- FOBT to be collected -- consider iron supplement  Hypertension: treated at home with lisinopril and HCTZ, but has not been on meds recently.  -- hold home lisinopril and HCTZ -- continue to monitor BP with   Hyperglycemia: BG 132 ion ED -- obtain A1c  Hypokalemia: pt reports intermittent leg cramping. K+ 3.2 today -- 19mEq KCl -- check Mg  FEN/GI: given 1L NS bolus in ED -- NPO -- continuous IV NS @125cc /hr  PPx: -- lovenox $RemoveBe'40mg'sNsHydsUo$  injection q24H  Code: FULL CODE -- contact daughter, Coralee Pesa [phone]) in the event that patient is unable to make medical decisions  Dispo: Disposition is deferred at this time, awaiting improvement of current medical problems.   The patient does not have a current PCP (has seen Wallene Dales, MD in the past) and need for [facility] hospital follow-up appointment after discharge TBD.  The patient does not know have transportation limitations that hinder transportation to clinic appointments.  Signed: Louisa Second, Med Student 08/04/2014, 2:07 PM
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
## 2014-08-04 NOTE — ED Notes (Signed)
Pt is gone to Ct when she returns to we will try to get a urine sample

## 2014-08-04 NOTE — ED Notes (Signed)
Admitting team at bedside.

## 2014-08-04 NOTE — ED Notes (Signed)
[facility]- pt coming from home with LLQ abd pain since 0300 this morning. Pt reports 4 episodes of emesis. Sinus tach with EMS at 114. Pt given 49mcg of fentanyl. Pain 9/10. Reports constipation as well.

## 2014-08-05 DIAGNOSIS — L299 Pruritus, unspecified: Secondary | ICD-10-CM

## 2014-08-05 DIAGNOSIS — E785 Hyperlipidemia, unspecified: Secondary | ICD-10-CM | POA: Diagnosis present

## 2014-08-05 DIAGNOSIS — R7309 Other abnormal glucose: Secondary | ICD-10-CM

## 2014-08-05 DIAGNOSIS — R7303 Prediabetes: Secondary | ICD-10-CM | POA: Diagnosis present

## 2014-08-05 LAB — HEMOGLOBIN A1C
Hgb A1c MFr Bld: 6.4 % — ABNORMAL HIGH (ref 4.8–5.6)
Mean Plasma Glucose: 137 mg/dL

## 2014-08-05 LAB — CBC
HCT: 31.8 % — ABNORMAL LOW (ref 36.0–46.0)
HEMOGLOBIN: 10.2 g/dL — AB (ref 12.0–15.0)
MCH: 26.4 pg (ref 26.0–34.0)
MCHC: 32.1 g/dL (ref 30.0–36.0)
MCV: 82.4 fL (ref 78.0–100.0)
PLATELETS: 215 10*3/uL (ref 150–400)
RBC: 3.86 MIL/uL — AB (ref 3.87–5.11)
RDW: 14.2 % (ref 11.5–15.5)
WBC: 9.2 10*3/uL (ref 4.0–10.5)

## 2014-08-05 LAB — TROPONIN I: Troponin I: <0.03 ng/mL (ref <0.031)

## 2014-08-05 LAB — BASIC METABOLIC PANEL
Anion gap: 4 — ABNORMAL LOW (ref 5–15)
BUN: 6 mg/dL (ref 6–23)
CHLORIDE: 107 mmol/L (ref 96–112)
CO2: 28 mmol/L (ref 19–32)
Calcium: 8.6 mg/dL (ref 8.4–10.5)
Creatinine, Ser: 0.64 mg/dL (ref 0.50–1.10)
GFR calc Af Amer: >90 mL/min (ref >90)
GFR calc non Af Amer: >90 mL/min (ref >90)
GLUCOSE: 115 mg/dL — AB (ref 70–99)
POTASSIUM: 3.4 mmol/L — AB (ref 3.5–5.1)
Sodium: 139 mmol/L (ref 135–145)

## 2014-08-05 LAB — IRON AND TIBC
IRON: 20 ug/dL — AB (ref 42–145)
Saturation Ratios: 5 % — ABNORMAL LOW (ref 20–55)
TIBC: 394 ug/dL (ref 250–470)
UIBC: 374 ug/dL (ref 125–400)

## 2014-08-05 LAB — LIPID PANEL
CHOL/HDL RATIO: 4 ratio
Cholesterol: 152 mg/dL (ref 0–200)
HDL: 38 mg/dL — AB (ref >39)
LDL CALC: 102 mg/dL — AB (ref 0–99)
Triglycerides: 62 mg/dL (ref <150)
VLDL: 12 mg/dL (ref 0–40)

## 2014-08-05 LAB — FERRITIN: Ferritin: 27 ng/mL (ref 10–291)

## 2014-08-05 LAB — FOLATE: Folate: >20 ng/mL

## 2014-08-05 LAB — MRSA PCR SCREENING: MRSA BY PCR: NEGATIVE

## 2014-08-05 LAB — MAGNESIUM: MAGNESIUM: 1.7 mg/dL (ref 1.5–2.5)

## 2014-08-05 LAB — VITAMIN B12: VITAMIN B 12: 692 pg/mL (ref 211–911)

## 2014-08-05 MED ORDER — ACETAMINOPHEN 500 MG PO TABS
500.0000 mg | ORAL_TABLET | Freq: Four times a day (QID) | ORAL | Status: DC | PRN
  Administered 2014-08-05: 500 mg via ORAL
  Filled 2014-08-05: qty 1

## 2014-08-05 MED ORDER — PROMETHAZINE HCL 25 MG PO TABS: 12.5000 mg | ORAL_TABLET | Freq: Four times a day (QID) | ORAL | Status: DC | PRN

## 2014-08-05 MED ORDER — CIPROFLOXACIN HCL 500 MG PO TABS
500.0000 mg | ORAL_TABLET | Freq: Two times a day (BID) | ORAL | Status: DC
  Administered 2014-08-05 – 2014-08-06 (×2): 500 mg via ORAL
  Filled 2014-08-05 (×4): qty 1

## 2014-08-05 MED ORDER — ACETAMINOPHEN 325 MG PO TABS
650.0000 mg | ORAL_TABLET | Freq: Once | ORAL | Status: AC
  Administered 2014-08-05: 650 mg via ORAL
  Filled 2014-08-05: qty 2

## 2014-08-05 MED ORDER — METRONIDAZOLE 500 MG PO TABS
500.0000 mg | ORAL_TABLET | Freq: Three times a day (TID) | ORAL | Status: DC
  Administered 2014-08-05 – 2014-08-06 (×3): 500 mg via ORAL
  Filled 2014-08-05 (×5): qty 1

## 2014-08-05 MED ORDER — HYDROCHLOROTHIAZIDE 25 MG PO TABS
25.0000 mg | ORAL_TABLET | Freq: Every day | ORAL | Status: DC
  Administered 2014-08-05 – 2014-08-06 (×2): 25 mg via ORAL
  Filled 2014-08-05 (×3): qty 1

## 2014-08-05 MED ORDER — PROMETHAZINE HCL 25 MG/ML IJ SOLN: 6.2500 mg | Freq: Four times a day (QID) | INTRAMUSCULAR | Status: DC | PRN

## 2014-08-05 MED ORDER — LORATADINE 10 MG PO TABS
10.0000 mg | ORAL_TABLET | Freq: Every day | ORAL | Status: DC | PRN
  Administered 2014-08-06: 10 mg via ORAL
  Filled 2014-08-05: qty 1

## 2014-08-05 MED ORDER — OXYCODONE-ACETAMINOPHEN 5-325 MG PO TABS
1.0000 | ORAL_TABLET | ORAL | Status: DC | PRN
  Administered 2014-08-05 – 2014-08-06 (×4): 1 via ORAL
  Filled 2014-08-05 (×4): qty 1

## 2014-08-05 MED ORDER — POTASSIUM CHLORIDE 10 MEQ/100ML IV SOLN
10.0000 meq | INTRAVENOUS | Status: AC
  Administered 2014-08-05 (×2): 10 meq via INTRAVENOUS
  Filled 2014-08-05: qty 100

## 2014-08-05 NOTE — Progress Notes (Signed)
Subjective:    [name] reports that she is feeling about the same as yesterday. She continues to have LLQ abdominal pain which was treated with 1mg  Dilaudid twice overnight. She also had an episode of nonbloody, nonbilious vomiting. Zofran did not help nausea; Phenergan was more effective. Pt concerned that the nausea/vomiting may be related to NPO status and taking meds on an empty stomach; she is ready to advance her diet. She reports headache, dizziness, and itching over most of back. She has not had a bowel movement since a small BM yesterday morning. Last, continuous IV NS @125cc /hr was ordered; however, pt was not connected to IV this morning.   Objective:    Vital Signs:   Temp:  [97.5 F (36.4 C)-99.3 F (37.4 C)] 97.5 F (36.4 C) (02/01 1507) Pulse Rate:  [79-111] 86 (02/01 1507) Resp:  [17-22] 20 (02/01 1507) BP: (138-155)/(72-86) 154/72 mmHg (02/01 1507) SpO2:  [99 %-100 %] 100 % (02/01 1507) Last BM Date: 08/03/14  24-hour weight change: Weight change: n/a  Intake/Output:   Intake/Output Summary (Last 24 hours) at 08/05/14 1513 Last data filed at 08/05/14 0600  Gross per 24 hour  Intake 1243.75 ml  Output      0 ml  Net 1243.75 ml      Physical Exam: General: alert and cooperative, lying in bed, NAD. Appears stated age, moderately obese.  HEENT: EOMI, anicteric sclera. nares patent, no discharge.  CV: RRR, normal S1 S2, no m/r/g Lungs: CTAB, normal work of breathing, no wheezes Abdomen: tender to palpation in LLQ, mild guarding, no rebound tenderness. Soft, nondistended, obese, well-healed midline/periumbilicval surgical scars. Neuro: AOx3   Labs:  CBC:  Recent Labs Lab 08/04/14 0901 08/05/14 0545  WBC 10.6* 9.2  NEUTROABS 7.8*  --   HGB 11.2* 10.2*  HCT 33.4* 31.8*  MCV 79.5 82.4  PLT 230 338    Basic Metabolic Panel:  Recent Labs Lab 08/04/14 1540 08/05/14 0545  NA  --  139  K  --  3.4*  CL  --  107  CO2  --  28  GLUCOSE  --   115*  BUN  --  6  CREATININE  --  0.64  CALCIUM  --  8.6  MG 1.6 1.7  PHOS 3.7  --     Liver Function Tests:  Recent Labs Lab 08/04/14 0901  AST 21  ALT 11  ALKPHOS 53  BILITOT 0.6  PROT 6.8  ALBUMIN 3.6   Lipid Panel     Component Value Date/Time   CHOL 152 08/05/2014 0545   TRIG 62 08/05/2014 0545   HDL 38* 08/05/2014 0545   CHOLHDL 4.0 08/05/2014 0545   VLDL 12 08/05/2014 0545   LDLCALC 102* 08/05/2014 0545    Cardiac Enzymes:  Recent Labs Lab 08/05/14 0545  TROPONINI <0.03   Hemoglobin A1c: Collected: 08/04/14 1540. Resulted: 08/05/14 1035 HbA1c: 6.4   Microbiology: Results for orders placed or performed during the hospital encounter of 08/04/14  MRSA PCR Screening     Status: None   Collection Time: 08/05/14  2:38 AM  Result Value Ref Range Status   MRSA by PCR NEGATIVE NEGATIVE Final    Comment:        The GeneXpert MRSA Assay (FDA approved for NASAL specimens only), is one component of a comprehensive MRSA colonization surveillance program. It is not intended to diagnose MRSA infection nor to guide or monitor treatment for MRSA infections.     Coagulation Studies:  Recent Labs  08/04/14 1540  LABPROT 14.2  INR 1.09    Imaging: none    Medications:    Infusions: . sodium chloride 125 mL/hr at 08/05/14 1122    Scheduled Medications: . ciprofloxacin  400 mg Intravenous Q12H  . enoxaparin (LOVENOX) injection  40 mg Subcutaneous Q24H  . metronidazole  500 mg Intravenous Q8H    PRN Medications: acetaminophen, HYDROmorphone (DILAUDID) injection, ipratropium-albuterol, loratadine, ondansetron (ZOFRAN) IV, promethazine   Assessment/ Plan:    Pt is a 49 y.o. yo female with a PMHx of diverticulitis, asthma, anemia, and hypertension who was admitted on 08/04/2014 with symptoms of nausea/vomiting, constipation, abdominal pain, which was determined to be secondary to acute uncomplicated diverticulitis. Interventions at this time will  be focused on managing symptoms of diverticulitis (especially pain) and providing antibiotic coverage.   Acute uncomplicated diverticulitis: Pt presented with two days of abdominal pain, nausea/vomiting, and constipation due to diverticulitis. Pt had similar episode in [place] of last year, also diagnosed as diverticulitis. Pt missed follow-up appointments. CT yesterday (1/31) revealedfocal acute diverticulitis at the junction of the descending and sigmoid colon and no abscesses. Overall, pt is stable. -- continue antibiotics: Cipro and Flagyl. Switch to oral -- continue 1mg  Dilaudid q4H PRN for pain. Will need to titrate down on pain meds as tolerated in preparation for discharge -- continue alternating 4mg  Zofran q6H PRN, 12.5mg  IV Phenergan q6H for nausea -- plan for outpt GI follow-up for colonoscopy following discharge  Chest pain: Possibly secondary to asthma as does not always have access to inhaler. MI unlikely as EKG (1/31) showed normal sinus rhythm, left ventricular hypertrophy, poor R wave progression. Initial troponin (2/1) was <0.03. GERD unlikely as she has not eaten since Saturday 1/30. PE, pneumothorax, aortic dissection, unlikely given hemodynamic stability. -- continue Duonebs q4H PRN for wheezing, SOB  Asthma: O2 sat 100% on room air  -- Duonebs as described above  Hypokalemia: K+ was 3.25mmol/L on presentation. Given 91mEq KCl IV on 1/31. K+ slightly increased to 3.4. Mg& Phos within normal limits. -- 10 mEq KCl x 2 -- f/u BMP tomorrow  Anemia: baseline Hgb 10-11.2 over the past year. Hgb 11.2 today. MCV 79 on admission. Iron level low at 20ug/dL, TIBC 5% on 08/04/14  -- consider beginning iron supplement at discharge  Hypertension: Systolic BP trending 502D-741O. On 25mg  HCTZ daily at home.  -- restart home HCTZ  Prediabetes: Glucose 132 on admission with prior elevated values. 08/05/14 HbA1c 6.4 -- follow-up hyperglycemia management at next outpt appointment on  08/12/14  Dizziness: Pt reported dizziness on standing. However, has not been receiving IV or PO fluids. -- resume PO and IV fluid intake -- consider measuring orthostatics if symptoms do not improve  Pruritis: new-onset back itching this morning possibly due to drug allergy  -- Claritin PRN for itching  FEN/GI: given 1L NS bolus in ED.  -- full liquid diet -- continuous IV NS @125cc /hr  PPx: -- 40mg  lovenox injection q24H  Code: FULL CODE -- contact daughter, Coralee Pesa [phone]) in the event that patient is unable to make medical decisions  Consults placed:  Dispo: Continue floor admission status. Dispo pending pain control, transition to PO antibiotics, and tolerating diet.  The patient does not have a current PCP (has seen Wallene Dales, MD in the past). Will have [facility] hospital follow-up with Dr. Clinton Gallant on 08/12/14.  Unknown status regarding transportation limitations that hinder transportation to clinic appointments.  SERVICE NEEDED AT DISCHARGE - TO BE  DETERMINED DURING HOSPITAL COURSE         Y = Yes, Blank = No PT:   OT:   RN:   Equipment:   Other:      Length of Stay: 1 day(s)   Signed: Louisa Second, Med Student  Pager: [phone] (7AM-5PM) 08/05/2014, 3:13 PM

## 2014-08-05 NOTE — Progress Notes (Signed)
Subjective:    Currently, the patient reports less nausea and improvement in her pain after receiving Dilaudid. She does continue to have LLQ abdominal pain, but she reports being hungry and wanting to eat.  She has not had any episodes of nausea since last night.  She received a breathing treatment yesterday, and she says her chest pain and tightness has improved.  She reports some itchiness on her back since receiving Zofran yesterday along with some dizziness when she sits up.  Interval Events: -Advanced diet to liquids for lunch, which she tolerated.  Nurse found her eating Ritz crackers in bed. -Received KCl supplementation with some improvement of her K.   Objective:    Vital Signs:   Temp:  [97.5 F (36.4 C)-99.3 F (37.4 C)] 97.5 F (36.4 C) (02/01 1507) Pulse Rate:  [79-93] 86 (02/01 1507) Resp:  [17-20] 20 (02/01 1507) BP: (138-154)/(72-74) 154/72 mmHg (02/01 1507) SpO2:  [100 %] 100 % (02/01 1507) Last BM Date: 08/03/14  24-hour weight change: Weight change:   Intake/Output:   Intake/Output Summary (Last 24 hours) at 08/05/14 1614 Last data filed at 08/05/14 1500  Gross per 24 hour  Intake 2898.75 ml  Output      0 ml  Net 2898.75 ml      Physical Exam: General: Vital signs reviewed and noted. Well-developed, well-nourished, in no acute distress; alert, appropriate and cooperative throughout examination.  Lungs:  Normal respiratory effort. Clear to auscultation BL without crackles or wheezes.  Heart: RRR. S1 and S2 normal without gallop, murmur, or rubs.  Abdomen:  BS normoactive. Tenderness with palpation of LLQ, along with referred tenderness to that area with palpation elsewhere.  Vertical scar on lower abdomen.  Extremities: No pretibial edema.     Labs:  Basic Metabolic Panel:  Recent Labs Lab 08/04/14 0901 08/04/14 1540 08/05/14 0545  NA 138  --  139  K 3.2*  --  3.4*  CL 107  --  107  CO2 24  --  28  GLUCOSE 132*  --  115*  BUN 8  --  6    CREATININE 0.68  --  0.64  CALCIUM 9.6  --  8.6  MG  --  1.6 1.7  PHOS  --  3.7  --     Liver Function Tests:  Recent Labs Lab 08/04/14 0901  AST 21  ALT 11  ALKPHOS 53  BILITOT 0.6  PROT 6.8  ALBUMIN 3.6   CBC:  Recent Labs Lab 08/04/14 0901 08/05/14 0545  WBC 10.6* 9.2  NEUTROABS 7.8*  --   HGB 11.2* 10.2*  HCT 33.4* 31.8*  MCV 79.5 82.4  PLT 230 215    Cardiac Enzymes:  Recent Labs Lab 08/05/14 0545  TROPONINI <0.03   Microbiology: Results for orders placed or performed during the hospital encounter of 08/04/14  MRSA PCR Screening     Status: None   Collection Time: 08/05/14  2:38 AM  Result Value Ref Range Status   MRSA by PCR NEGATIVE NEGATIVE Final    Comment:        The GeneXpert MRSA Assay (FDA approved for NASAL specimens only), is one component of a comprehensive MRSA colonization surveillance program. It is not intended to diagnose MRSA infection nor to guide or monitor treatment for MRSA infections.     Coagulation Studies:  Recent Labs  08/04/14 1540  LABPROT 14.2  INR 1.09    Imaging: Ct Abdomen Pelvis W Contrast  08/04/2014  CLINICAL DATA:  Left lower quadrant abdominal pain.  EXAM: CT ABDOMEN AND PELVIS WITH CONTRAST  TECHNIQUE: Multidetector CT imaging of the abdomen and pelvis was performed using the standard protocol following bolus administration of intravenous contrast.  CONTRAST:  172mL OMNIPAQUE IOHEXOL 300 MG/ML  SOLN  COMPARISON:  CT scan of [place] 13, 2015.  FINDINGS: Visualized lung bases appear normal. No significant osseous abnormality is noted.  Status post cholecystectomy. Small sliding-type hiatal hernia is noted. The liver, spleen and pancreas appear normal. Adrenal glands appear normal. No hydronephrosis or renal obstruction is noted. Stable right renal cysts are noted. Cortical scarring of upper pole of right kidney is again noted. No hydronephrosis or renal obstruction is noted.  There is no evidence of bowel  obstruction. Appendix appears normal. Acute diverticulitis is noted at the junction of the descending and sigmoid colon. No definite abscess is noted at this time. Uterus and urinary bladder appear normal. No significant adenopathy is noted.  IMPRESSION: Focal acute diverticulitis is noted at the junction of the descending and sigmoid colon. No abscess is seen at this time.   Electronically Signed   By: Sabino Dick M.D.   On: 08/04/2014 11:28       Medications:    Infusions:    Scheduled Medications: . ciprofloxacin  500 mg Oral BID  . enoxaparin (LOVENOX) injection  40 mg Subcutaneous Q24H  . metroNIDAZOLE  500 mg Oral 3 times per day    PRN Medications: acetaminophen, ipratropium-albuterol, loratadine, oxyCODONE-acetaminophen, promethazine **OR** promethazine   Assessment/ Plan:    Principal Problem:   Acute diverticulitis Active Problems:   Asthma, chronic   Hypertension   Anemia   Hypokalemia   Elevated fasting glucose   Obesity   #Acute diverticulitis No evidence of abscess or perforation on CT.  Nausea and vomiting improved today with WBC trending down.  Able to tolerate clears and Len Blal[place].  Will switch to PO meds with hopes to discharge home tomorrow.  Dizziness likely related to poor PO intake from vomiting. -PO Cipro and Flagyl. -Swtich from Dilaudid to Percocet 5-325 mg q4h PRN pain. -Full liquid diet. Stop fluids. -Will need colonoscopy as outpatient due to recurrent diverticulitis. -Cancel FOBT as will need colonoscopy regardless of result.  #Anemia At baseline 10.2 with MCV 82.4.  Ferritin 27 with 5% saturation, consistent with iron deficiency anemia. -Would benefit from iron supplementation at discharge.  #Hypokalemia Has not been taking diuretics.  Potentially due to albuterol nebs.  Improved after supplementation yesterday, but still low.  Mg normal. -KCl 10 mEq x2. -Recheck BMP tomorrow.  #Hypertension Running in 539-767H systolic. -Restart home HCTZ  25 mg daily.  #Asthma Chest pain resolved and EKG unremarkable. -Continue Duonebs q4h PRN.  #Pruritis Likely secondary to Zofran or Dilaudid. -Claritin 10 mg PRN.  #Prediabetes A1C 6.4.  10-year ASCVD risk 8.6% based on lipid panel. -Diet and lifestyle modifications. -Consider starting a statin.    DVT PPX - low molecular weight heparin  CODE STATUS - Full  CONSULTS PLACED - None.  DISPO - Disposition is deferred at this time, awaiting improvement of diverticulitis.   Anticipated discharge in approximately 1 day(s).   The patient does have a current PCP (ASRES,ALEHEGN, MD) and does need an [facility] hospital follow-up appointment after discharge.    Is the [facility] hospital follow-up appointment a one-time only appointment? no.  Does the patient have transportation limitations that hinder transportation to clinic appointments? no   SERVICE NEEDED AT DISCHARGE - TO BE DETERMINED  DURING HOSPITAL COURSE         Y = Yes, Blank = No PT:   OT:   RN:   Equipment:   Other:      Length of Stay: 1 day(s)   Signed: Arman Filter, MD  PGY-1, Internal Medicine Resident Pager: [phone] (7AM-5PM) 08/05/2014, 4:14 PM

## 2014-08-06 DIAGNOSIS — D509 Iron deficiency anemia, unspecified: Secondary | ICD-10-CM

## 2014-08-06 LAB — BASIC METABOLIC PANEL
Anion gap: 7 (ref 5–15)
CO2: 27 mmol/L (ref 19–32)
Calcium: 8.8 mg/dL (ref 8.4–10.5)
Chloride: 105 mmol/L (ref 96–112)
Creatinine, Ser: 0.68 mg/dL (ref 0.50–1.10)
GFR calc Af Amer: >90 mL/min (ref >90)
Glucose, Bld: 120 mg/dL — ABNORMAL HIGH (ref 70–99)
Potassium: 3.2 mmol/L — ABNORMAL LOW (ref 3.5–5.1)
SODIUM: 139 mmol/L (ref 135–145)

## 2014-08-06 LAB — HIV ANTIBODY (ROUTINE TESTING W REFLEX): HIV Screen 4th Generation wRfx: NONREACTIVE

## 2014-08-06 MED ORDER — LISINOPRIL 10 MG PO TABS: 10.0000 mg | ORAL_TABLET | Freq: Every day | ORAL | Status: DC

## 2014-08-06 MED ORDER — LISINOPRIL 10 MG PO TABS
10.0000 mg | ORAL_TABLET | Freq: Every day | ORAL | Status: DC
  Administered 2014-08-06: 10 mg via ORAL
  Filled 2014-08-06: qty 1

## 2014-08-06 MED ORDER — POTASSIUM CHLORIDE ER 20 MEQ PO TBCR: 20.0000 meq | EXTENDED_RELEASE_TABLET | Freq: Every day | ORAL | Status: DC

## 2014-08-06 MED ORDER — CIPROFLOXACIN HCL 500 MG PO TABS: 500.0000 mg | ORAL_TABLET | Freq: Two times a day (BID) | ORAL | Status: DC

## 2014-08-06 MED ORDER — HYDROCHLOROTHIAZIDE 25 MG PO TABS: 25.0000 mg | ORAL_TABLET | Freq: Every day | ORAL | Status: DC

## 2014-08-06 MED ORDER — OXYCODONE-ACETAMINOPHEN 5-325 MG PO TABS: 1.0000 | ORAL_TABLET | Freq: Four times a day (QID) | ORAL | Status: DC | PRN

## 2014-08-06 MED ORDER — POTASSIUM CHLORIDE CRYS ER 20 MEQ PO TBCR
40.0000 meq | EXTENDED_RELEASE_TABLET | Freq: Once | ORAL | Status: AC
  Administered 2014-08-06: 40 meq via ORAL
  Filled 2014-08-06: qty 2

## 2014-08-06 MED ORDER — METRONIDAZOLE 500 MG PO TABS: 500.0000 mg | ORAL_TABLET | Freq: Three times a day (TID) | ORAL | Status: DC

## 2014-08-06 MED ORDER — PROMETHAZINE HCL 12.5 MG PO TABS: 12.5000 mg | ORAL_TABLET | Freq: Four times a day (QID) | ORAL | Status: DC | PRN

## 2014-08-06 NOTE — Discharge Summary (Signed)
Name: [name] MRN: [id number] DOB: August 27, 1965 49 y.o. PCP: Wallene Dales, MD  Date of Admission: 08/04/2014  8:51 AM Date of Discharge: 08/06/2014 Attending Physician: Karren Cobble, MD  Discharge Diagnosis:  Principal Problem:   Acute diverticulitis Active Problems:   Asthma, chronic   Hypertension   Anemia   Hypokalemia   Obesity   Prediabetes   Hyperlipidemia  Discharge Medications:   Medication List    TAKE these medications        acetaminophen 500 MG tablet  Commonly known as:  TYLENOL  Take 500 mg by mouth every 6 (six) hours as needed for moderate pain.     albuterol 108 (90 BASE) MCG/ACT inhaler  Commonly known as:  PROVENTIL HFA;VENTOLIN HFA  Inhale 2 puffs into the lungs every 6 (six) hours as needed for wheezing.     ciprofloxacin 500 MG tablet  Commonly known as:  CIPRO  Take 1 tablet (500 mg total) by mouth 2 (two) times daily.     hydrochlorothiazide 25 MG tablet  Commonly known as:  HYDRODIURIL  Take 1 tablet (25 mg total) by mouth daily.     lisinopril 10 MG tablet  Commonly known as:  PRINIVIL,ZESTRIL  Take 1 tablet (10 mg total) by mouth daily.     metroNIDAZOLE 500 MG tablet  Commonly known as:  FLAGYL  Take 1 tablet (500 mg total) by mouth every 8 (eight) hours.     oxyCODONE-acetaminophen 5-325 MG per tablet  Commonly known as:  PERCOCET/ROXICET  Take 1 tablet by mouth every 6 (six) hours as needed for severe pain.     Potassium Chloride ER 20 MEQ Tbcr  Take 20 mEq by mouth daily.     promethazine 12.5 MG tablet  Commonly known as:  PHENERGAN  Take 1 tablet (12.5 mg total) by mouth every 6 (six) hours as needed for nausea.        Disposition and follow-up:   [name] was discharged from [facility] in Stable condition.  At the hospital follow up visit please address:  1.  Please refer to GI for a colonoscopy in 6-8 weeks, check blood pressure control, diet and lifestyle modifications for  prediabetes, start iron supplementation, consider starting statin.  Needs to establish with a PCP.  2.  Labs / imaging needed at time of follow-up: BMP to check K.  Consider plasma renin activity and aldosterone levels to evaluate for hyperaldosteronism.  3.  Pending labs/ test needing follow-up: None.  Follow-up Appointments:     Follow-up Information    Follow up with Clinton Gallant, MD. Go on 08/12/2014.   Specialty:  Internal Medicine   Why:  8:45am for hospital discharge f/u: uncomplicatd acute diverticulitis   Contact information:   [address] [place] [address] [phone]       Discharge Instructions:  Thank you for allowing [place] to be involved in your healthcare while you were hospitalized at [facility].   Please note that there have been changes to your home medications.  --> PLEASE LOOK AT YOUR DISCHARGE MEDICATION LIST FOR DETAILS.   Please call our clinic at [phone] if you have any questions or concerns, or any difficulty getting any of your medications.  Please return to the ER if you have worsening of your symptoms or new severe symptoms arise.  Make sure to complete all of your antibiotics.  You will need to take Cipro and Flagyl for 7 more  days (Finishing on 08-13-14).  Make sure to follow up in our clinic and establish with a primary care doctor.  You will need a colonoscopy in 6 to 8 weeks to determine why you keep having diverticulitis.  Discharge Instructions    Call MD for:  extreme fatigue    Complete by:  As directed      Call MD for:  persistant dizziness or light-headedness    Complete by:  As directed      Call MD for:  persistant nausea and vomiting    Complete by:  As directed      Call MD for:  redness, tenderness, or signs of infection (pain, swelling, redness, odor or green/yellow discharge around incision site)    Complete by:  As directed      Call MD for:  severe uncontrolled pain    Complete by:  As directed      Call  MD for:  temperature >100.4    Complete by:  As directed      Diet - low sodium heart healthy    Complete by:  As directed      Increase activity slowly    Complete by:  As directed            Consultations: None.  Procedures Performed:  Ct Abdomen Pelvis W Contrast  08/04/2014   CLINICAL DATA:  Left lower quadrant abdominal pain.  EXAM: CT ABDOMEN AND PELVIS WITH CONTRAST  TECHNIQUE: Multidetector CT imaging of the abdomen and pelvis was performed using the standard protocol following bolus administration of intravenous contrast.  CONTRAST:  165mL OMNIPAQUE IOHEXOL 300 MG/ML  SOLN  COMPARISON:  CT scan of [name] 13, 2015.  FINDINGS: Visualized lung bases appear normal. No significant osseous abnormality is noted.  Status post cholecystectomy. Small sliding-type hiatal hernia is noted. The liver, spleen and pancreas appear normal. Adrenal glands appear normal. No hydronephrosis or renal obstruction is noted. Stable right renal cysts are noted. Cortical scarring of upper pole of right kidney is again noted. No hydronephrosis or renal obstruction is noted.  There is no evidence of bowel obstruction. Appendix appears normal. Acute diverticulitis is noted at the junction of the descending and sigmoid colon. No definite abscess is noted at this time. Uterus and urinary bladder appear normal. No significant adenopathy is noted.  IMPRESSION: Focal acute diverticulitis is noted at the junction of the descending and sigmoid colon. No abscess is seen at this time.   Electronically Signed   By: Sabino Dick M.D.   On: 08/04/2014 11:28    Admission HPI:  [name] is a 49 year old female with HTN, asthma, GERD, anemia who presents with L-sided abdominal pain.   On Friday [1/29], she reports she was running errands when she first noted the onset of pain in her L abdomen/thigh that radiated down to her leg. She reports the pain is constant and pressure-like, worse with movement though nothing seems to make it  better. Pain is associated with nausea [last meal on Friday], vomiting x 4 [mostly yellow colored], chills and firm stool [last bowel movement this AM] but no diarrhea, melena, hematochezia, fever. She had a prior episode in [place] 2015 though was never able to establish PCP care with [facility] nor undergo colonoscopy. Prior surgical history includes cholecystectomy, hernia repair; tubal ligation also noted on chart review though not confirmed with her at the time of interview. Otherwise, she denies fever, dysuria, hematuria, changes/pain in menstruation [LMP 1/20],  recent sexual activity, prior history of STIs, vaginal bleeding, recent tobacco/alcohol/illicit drug use. 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   In the ED, abdominal CT with contrast was notable for focal acute diverticulitis at the junction of the descending and sigmoid colon. She was started on IV antibiotics [ciprofloxacin & Flagyl].   Hospital Course by problem list: Principal Problem:   Acute diverticulitis Active Problems:   Asthma, chronic   Hypertension   Anemia   Hypokalemia   Obesity   Prediabetes   Hyperlipidemia   #Acute diverticulitis She presented with left lower quadrant abdominal pain associated with nausea and vomiting. CT scan showed focal diverticulitis at the junction of the descending and sigmoid colon.  She was unable to tolerate PO meds, so she was admitted to the hospital for IV Cipro and Flagyl along with pain control and rehydration.  She steadily improved over the next two days and her diet was advanced slowly.  Upon discharge, her pain was controlled with PO Percocet, and she was tolerating PO antibiotics and a full diet.  She was given 7 days of Cipro and Flagyl at discharge for a 10 day course ending 08/13/13, along with Phenergan and Percocet.  She will need to be scheduled for a colonoscopy to identify the cause of her recurrent  diverticulitis.  #Hypertension She ran out of her blood pressure medications, and her blood pressure was elevated during the hospitalization.  Her potassium was persistently low, and she reported problems with this in the past, suggesting a possible diagnosis of hyperaldosteronism, which could be worked up as an outpatient.  She was started on HCTZ 25 mg and lisinopril 10 mg daily, which was her previous regimen. In addition, she was given a prescription for Kdur 20 mEq daily to supplement her K.  She will need a repeat BMP on follow up to check her potassium level.  #Prediabetes Her blood sugar was mildly elevated during the hospitalization and had been elevated in the past. Hemoglobin A1c was 6.4.  She was counseled on diet and lifestyle modifications, but this should be followed up as an outpatient.  Her lipid panel showed an ASCVD risk of 8.5% for 10 years, so she may benefit from starting a statin as an outpatient.  #Iron deficiency anemia She has a baseline Hgb of 10.2 with MCV of 82.4.  Anemia panel was consistent with iron deficiency anemia.  Initiation of iron supplementation was deferred to the outpatient setting to avoid constipation in the setting of her diverticulitis.  Discharge Vitals:   BP 163/82 mmHg  Pulse 75  Temp(Src) 98.2 F (36.8 C) (Oral)  Resp 18  Ht 5\' 1"  (1.549 m)  Wt 182 lb (82.555 kg)  BMI 34.41 kg/m2  SpO2 100%  LMP 07/24/2014  Discharge Labs:  Results for orders placed or performed during the hospital encounter of 08/04/14 (from the past 24 hour(s))  Basic metabolic panel     Status: Abnormal   Collection Time: 08/06/14  5:07 AM  Result Value Ref Range   Sodium 139 135 - 145 mmol/L   Potassium 3.2 (L) 3.5 - 5.1 mmol/L   Chloride 105 96 - 112 mmol/L   CO2 27 19 - 32 mmol/L   Glucose, Bld 120 (H) 70 - 99 mg/dL   BUN <5 (L) 6 - 23 mg/dL   Creatinine, Ser 0.68 0.50 - 1.10 mg/dL   Calcium 8.8 8.4 - 10.5 mg/dL   GFR calc non Af  Amer >90 >90 mL/min   GFR  calc Af Amer >90 >90 mL/min   Anion gap 7 5 - 15    Signed: Arman Filter, MD 08/06/2014, 2:19 PM    Services Ordered on Discharge: None. Equipment Ordered on Discharge: None.

## 2014-08-06 NOTE — Progress Notes (Signed)
Subjective:    [name] reports that her abdominal pain has "calmed down" and is controlled with PO pain meds. She required three doses of 5-325mg  Percocet overnight/this morning. She also complains of a sinus headache w/ pain across her forehead. The pain is similar to prior headaches associated with allergies. She has no relief with Claritin, but states that cetrizine has helped in the past. She is tolerating diet well; reports some nausea, but no vomiting. The nausea is related to the food selection (did not like pudding or oatmeal), not volume. She has had some gas but no bowel movements. Chest pain is improved, and she has no shortness of breath at this time. She reports some swelling in right hand as well.   Interval Events: -- advanced to full diet and PO Cipro, flagyl, and Percocet -- started lisinopril 10mg  daily   Objective:    Vital Signs:   Temp:  [97.5 F (36.4 C)-98.6 F (37 C)] 98.2 F (36.8 C) (02/02 0548) Pulse Rate:  [71-86] 75 (02/02 0548) Resp:  [18-20] 18 (02/02 0548) BP: (142-163)/(67-82) 163/82 mmHg (02/02 0548) SpO2:  [100 %] 100 % (02/02 0548) Weight:  [82.555 kg (182 lb)] 82.555 kg (182 lb) (02/02 0548) Last BM Date: 08/03/14  24-hour weight change: Weight change: -1.361 kg (-3 lb)  Intake/Output:   Intake/Output Summary (Last 24 hours) at 08/06/14 1109 Last data filed at 08/05/14 1500  Gross per 24 hour  Intake   1655 ml  Output      0 ml  Net   1655 ml      Physical Exam: General: alert and cooperative, lying in bed, NAD. Appears stated age, moderately obese.  HEENT: EOMI, anicteric sclera. nares patent, no discharge.  CV: RRR, normal S1 S2, no m/r/g. Radial pulses 2+ Lungs: CTAB, normal work of breathing, no wheezes Abdomen: tender to palpation in LLQ, mild guarding, no rebound tenderness. Firm in LUQ and LLQ, nondistended, obese, well-healed midline/periumbilicval surgical scars. Neuro: AOx3, grip strength 2+ bilaterally Ext: left calf  tender to palpation. No edema in upper or lower extremities.     Labs:  Basic Metabolic Panel:  Recent Labs Lab 08/04/14 1540 08/05/14 0545 08/06/14 0507  NA  --  139 139  K  --  3.4* 3.2*  CL  --  107 105  CO2  --  28 27  GLUCOSE  --  115* 120*  BUN  --  6 <5*  CREATININE  --  0.64 0.68  CALCIUM  --  8.6 8.8  MG 1.6 1.7  --   PHOS 3.7  --   --     Imaging: No new imaging     Medications:    Infusions:    Scheduled Medications: . ciprofloxacin  500 mg Oral BID  . enoxaparin (LOVENOX) injection  40 mg Subcutaneous Q24H  . hydrochlorothiazide  25 mg Oral Daily  . lisinopril  10 mg Oral Daily  . metroNIDAZOLE  500 mg Oral 3 times per day    PRN Medications: ipratropium-albuterol, loratadine, oxyCODONE-acetaminophen, promethazine **OR** promethazine   Assessment/ Plan:    Pt is a 49 y.o. yo female with a PMHx of diverticulitis, asthma, anemia, and hypertension who was admitted on 08/04/2014 with symptoms of nausea/vomiting, constipation, abdominal pain, which was determined to be secondary to acute uncomplicated diverticulitis. Interventions at this time will be focused on managing symptoms of diverticulitis (especially pain), providing antibiotic coverage, and preparing pt for discharge with outpatient follow-up.   Acute uncomplicated  diverticulitis: Pt presented with two days of abdominal pain, nausea/vomiting, and constipation due to diverticulitis. Pt had similar episode in [place] of last year, also diagnosed as diverticulitis. Pt missed follow-up appointments. CT on 1/31 revealedfocal acute diverticulitis at the junction of the descending and sigmoid colon and no abscesses. Pt has remained in stable condition. -- continue oral Cipro 500mg  BID and Flagyl 500mg  Q8H -- continue PO Percocet 1 tablet Q4H PRN for pain -- continue Phenergan  12.5mg  tablet q6H for nausea -- f/u with PCP; appointment 08/12/14 -- plan for outpt GI follow-up for colonoscopy following  discharge  Chest pain: Improving, nearly resolved. 1/31 EKG unremarkable. Pt has remained hemodynamically stable.   Asthma: O2 sat 100% on room air. Was effectively treated with Duonebs q4H PRN for wheezing, SOB -- Switch to PRN albuterol at discharge  Hypokalemia: K+ was 3.26mmol/L on presentation. Given one dose 74mEq KCl IV on 1/31 and two doses 2/1. Na, Mg& Phos within normal limits. K+ 3.2 on 08/06/14.  -- begin potassium chloride (K-DUR) 40mg  tablets daily -- consider outpatient wowrkup for hyperaldosteronsim  Anemia: baseline Hgb 10-11.2 over the past year. Hgb 11.2 today. MCV 79 on admission. Iron level low at 20ug/dL, TIBC 5% on 08/04/14. Likely iron deficiency anemia. -- begin iron supplement at discharge  Hypertension: Systolic BP trending [id number]. On 25mg  HCTZ daily at home.  -- restart home HCTZ -- continue 10mg  lisinopril  Prediabetes: Glucose 132 on admission with prior elevated values. 08/05/14 HbA1c 6.4 -- follow-up hyperglycemia management at next outpt appointment  Dizziness: Pt reported dizziness on standing. However, has not been receiving IV or PO fluids. -- encourage increased PO fluid intake -- consider measuring orthostatics if symptoms do not improve  Pruritis: new-onset back itching this morning possibly due to drug allergy. No complaints of itching today (08/06/14).  -- switch to cetirizine PRN for itching  FEN/GI: given 1L NS bolus in ED.  -- regular diet  PPx: -- 40mg  lovenox injection q24H. d/c prior to discharge  Code: FULL CODE -- contact daughter, Coralee Pesa [phone]) in the event that patient is unable to make medical decisions  Consults placed:  Dispo: Continue floor admission status. Dispo pending pain control, transition to PO antibiotics, and tolerating diet.  The patient does not have a current PCP (has seen Wallene Dales, MD in the past). Will have [facility] hospital follow-up with Dr. Clinton Gallant on 08/12/14.  Unknown status regarding  transportation limitations that hinder transportation to clinic appointments.  SERVICE NEEDED AT [place]         Y = Yes, Blank = No PT:   OT:   RN:   Equipment:   Other:    Length of Stay: 2 day(s)  Signed: Louisa Second, Med Student  Pager: [phone] (7AM-5PM) 08/06/2014, 11:09 AM

## 2014-08-06 NOTE — Discharge Instructions (Addendum)
·   Thank you for allowing [place] to be involved in your healthcare while you were hospitalized at [facility].   Please note that there have been changes to your home medications.  --> PLEASE LOOK AT YOUR DISCHARGE MEDICATION LIST FOR DETAILS.   Please call our clinic at [phone] if you have any questions or concerns, or any difficulty getting any of your medications.  Please return to the ER if you have worsening of your symptoms or new severe symptoms arise.  Make sure to complete all of your antibiotics.  You will need to take Cipro and Flagyl for 7 more days (Finishing on 08-13-14).  Make sure to follow up in our clinic and establish with a primary care doctor.  You will need a colonoscopy in 6 to 8 weeks to determine why you keep having diverticulitis.

## 2014-08-06 NOTE — Progress Notes (Signed)
Subjective:    Reports improvement of her abdominal pain, and she says it's controlled with her by mouth Percocet. She says her nausea and vomiting significantly better, and she was able to tolerate full liquids yesterday without issue. She says she would like to try a full diet today and thinks she is ready to go home. She says her potassium is always low and has had problems with cramping in the past. She denies shortness of breath, chest pain, or dizziness currently.   Interval Events: -Potassium down after starting hydrochlorothiazide yesterday. -Tolerated full liquid diet. -Blood pressure elevated to 458K systolic after starting HCTZ.    Objective:    Vital Signs:   Temp:  [97.5 F (36.4 C)-98.6 F (37 C)] 98.2 F (36.8 C) (02/02 0548) Pulse Rate:  [71-86] 75 (02/02 0548) Resp:  [18-20] 18 (02/02 0548) BP: (142-163)/(67-82) 163/82 mmHg (02/02 0548) SpO2:  [100 %] 100 % (02/02 0548) Weight:  [182 lb (82.555 kg)] 182 lb (82.555 kg) (02/02 0548) Last BM Date: 08/03/14  24-hour weight change: Weight change: -3 lb (-1.361 kg)  Intake/Output:   Intake/Output Summary (Last 24 hours) at 08/06/14 1356 Last data filed at 08/05/14 1500  Gross per 24 hour  Intake   1415 ml  Output      0 ml  Net   1415 ml      Physical Exam: General: Vital signs reviewed and noted. Well-developed, well-nourished, in no acute distress; alert, appropriate and cooperative throughout examination.  Lungs:  Normal respiratory effort. Clear to auscultation BL without crackles or wheezes.  Heart: RRR. S1 and S2 normal without gallop, murmur, or rubs.  Abdomen:  BS normoactive.  Decreased tenderness with palpation of LLQ.  Extremities: No pretibial edema.     Labs:  Basic Metabolic Panel:  Recent Labs Lab 08/04/14 0901 08/04/14 1540 08/05/14 0545 08/06/14 0507  NA 138  --  139 139  K 3.2*  --  3.4* 3.2*  CL 107  --  107 105  CO2 24  --  28 27  GLUCOSE 132*  --  115* 120*  BUN 8  --  6  <5*  CREATININE 0.68  --  0.64 0.68  CALCIUM 9.6  --  8.6 8.8  MG  --  1.6 1.7  --   PHOS  --  3.7  --   --     Liver Function Tests:  Recent Labs Lab 08/04/14 0901  AST 21  ALT 11  ALKPHOS 53  BILITOT 0.6  PROT 6.8  ALBUMIN 3.6   CBC:  Recent Labs Lab 08/04/14 0901 08/05/14 0545  WBC 10.6* 9.2  NEUTROABS 7.8*  --   HGB 11.2* 10.2*  HCT 33.4* 31.8*  MCV 79.5 82.4  PLT 230 215    Cardiac Enzymes:  Recent Labs Lab 08/05/14 0545  TROPONINI <0.03   Microbiology: Results for orders placed or performed during the hospital encounter of 08/04/14  MRSA PCR Screening     Status: None   Collection Time: 08/05/14  2:38 AM  Result Value Ref Range Status   MRSA by PCR NEGATIVE NEGATIVE Final    Comment:        The GeneXpert MRSA Assay (FDA approved for NASAL specimens only), is one component of a comprehensive MRSA colonization surveillance program. It is not intended to diagnose MRSA infection nor to guide or monitor treatment for MRSA infections.     Coagulation Studies:  Recent Labs  08/04/14 1540  LABPROT 14.2  INR  1.09    Imaging: No results found.     Medications:    Infusions:    Scheduled Medications: . ciprofloxacin  500 mg Oral BID  . enoxaparin (LOVENOX) injection  40 mg Subcutaneous Q24H  . hydrochlorothiazide  25 mg Oral Daily  . lisinopril  10 mg Oral Daily  . metroNIDAZOLE  500 mg Oral 3 times per day    PRN Medications: ipratropium-albuterol, loratadine, oxyCODONE-acetaminophen, promethazine **OR** promethazine   Assessment/ Plan:    Principal Problem:   Acute diverticulitis Active Problems:   Asthma, chronic   Hypertension   Anemia   Hypokalemia   Obesity   Prediabetes   Hyperlipidemia   #Acute diverticulitis Symptoms are much improved currently.  Able to tolerate a full liquid diet without issue. Tolerating oral meds. We'll advance diet and discharge home today. -Continue PO Cipro and Flagyl for 10 days. Day  3 today. -Percocet 5-3 25 mg every 4 hours as needed for pain. -Full diet. -Colonoscopy in 6-8 weeks following resolution of diverticulitis. -Continue Phenergan when necessary nausea.  #Iron deficiency anemia -Consider starting iron at follow-up.   #Hypokalemia Decrease after starting HCTZ. -Kdur 40 mEq today. -Kdur 20 mEq at discharge.  #Hypertension Blood pressure still elevated after starting HCTZ. She reports taking lisinopril 10 mg per day in the past. She had  hypokalemia on presentation and reports this in the past. She warrants workup for hyperaldosteronism at follow-up. -Continue HCTZ 25 mg daily. -Start lisinopril 10 mg daily.   #Asthma Chest pain resolved. -Continue Duonebs q4h PRN.  #Pruritis Likely secondary to drug reaction. -Continue Claritin 10 mg PRN.  #Prediabetes A1C 6.4.  10-year ASCVD risk 8.6% based on lipid panel. -Discussed  diet and lifestyle modifications. -Consider starting a statin at follow-up.     DVT PPX - low molecular weight heparin  CODE STATUS - Full  CONSULTS PLACED - None.  DISPO - Discharge home this afternoon.  The patient does have a current PCP (ASRES,ALEHEGN, MD) and does need an [facility] hospital follow-up appointment after discharge.    Is the [facility] hospital follow-up appointment a one-time only appointment? no.  Does the patient have transportation limitations that hinder transportation to clinic appointments? no   SERVICE NEEDED AT [place]         Y = Yes, Blank = No PT:   OT:   RN:   Equipment:   Other:      Length of Stay: 2 day(s)   Signed: Arman Filter, MD  PGY-1, Internal Medicine Resident Pager: [phone] (7AM-5PM) 08/06/2014, 1:56 PM

## 2014-08-12 ENCOUNTER — Ambulatory Visit (INDEPENDENT_AMBULATORY_CARE_PROVIDER_SITE_OTHER): Payer: 59 | Admitting: Internal Medicine

## 2014-08-12 ENCOUNTER — Encounter: Payer: Self-pay | Admitting: Internal Medicine

## 2014-08-12 VITALS — BP 152/80 | HR 86 | Temp 98.5°F | Ht 61.0 in | Wt 187.1 lb

## 2014-08-12 DIAGNOSIS — K5792 Diverticulitis of intestine, part unspecified, without perforation or abscess without bleeding: Secondary | ICD-10-CM

## 2014-08-12 DIAGNOSIS — I10 Essential (primary) hypertension: Secondary | ICD-10-CM

## 2014-08-12 DIAGNOSIS — Z09 Encounter for follow-up examination after completed treatment for conditions other than malignant neoplasm: Secondary | ICD-10-CM

## 2014-08-12 LAB — BASIC METABOLIC PANEL
BUN: 13 mg/dL (ref 6–23)
CALCIUM: 9.1 mg/dL (ref 8.4–10.5)
CHLORIDE: 103 meq/L (ref 96–112)
CO2: 29 mEq/L (ref 19–32)
Creat: 0.58 mg/dL (ref 0.50–1.10)
Glucose, Bld: 143 mg/dL — ABNORMAL HIGH (ref 70–99)
Potassium: 3.8 mEq/L (ref 3.5–5.3)
Sodium: 139 mEq/L (ref 135–145)

## 2014-08-12 MED ORDER — LISINOPRIL 20 MG PO TABS: 10.0000 mg | ORAL_TABLET | Freq: Every day | ORAL | Status: DC

## 2014-08-12 NOTE — Patient Instructions (Signed)
General Instructions:   Please try to bring all your medicines next time. This will help [place] keep you safe from mistakes.   Progress Toward Treatment Goals:  No flowsheet data found.  Self Care Goals & Plans:  Self Care Goal 08/12/2014  Manage my medications take my medicines as prescribed; bring my medications to every visit; refill my medications on time  Eat healthy foods drink diet soda or water instead of juice or soda; eat more vegetables; eat foods that are low in salt; eat baked foods instead of fried foods; eat fruit for snacks and desserts    No flowsheet data found.   Care Management & Community Referrals:  No flowsheet data found.

## 2014-08-12 NOTE — Assessment & Plan Note (Signed)
BP Readings from Last 3 Encounters:  08/12/14 152/80  08/06/14 163/82  03/21/14 147/76    Lab Results  Component Value Date   NA 139 08/06/2014   K 3.2* 08/06/2014   CREATININE 0.68 08/06/2014    Assessment: Blood pressure control:   Progress toward BP goal:    Comments: Patient denies taking any of her K Dur as she says it "taste nasty" but has tried to increase her dietary potassium there was some concern for possibly hyperaldosteronism in the setting of hypokalemia and hypertension  Plan: Medications:  Continue HCTZ 25 mg daily and increase lisinopril to 20 g daily Educational resources provided: brochure (denies) Self management tools provided: home blood pressure logbook Other plans: Will check bmet to follow-up on potassium, and renin and Aldo level

## 2014-08-12 NOTE — Assessment & Plan Note (Signed)
Has resolved and the patient has completed antibiotic therapy. -GI referral can only be made once patient established this practice as PCP office

## 2014-08-12 NOTE — Progress Notes (Signed)
Subjective:   Patient ID: [name] female   DOB: 1966-05-31 49 y.o.   MRN: [id number]  HPI: [name] is a 49 y.o. woman with a past medical history as listed below who presents for hospital follow-up.  Patient was discharged on 08/06/14 for acute diverticulitis. The patient has had no medical attention until this point and therefore needs to establish care along with regular health maintenance. The patient states since her discharge she completed her antibiotic treatment and has felt overall improved. 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 She's been trying to use stool softeners or increase her fluid intake to prevent constipation as advised during her hospitalization. She denies any more abdominal pain, and does not have diarrhea, nausea, fever, hematochezia or hematemesis.  Past Medical History  Diagnosis Date  . Asthma   . Hypertension   . Anemia   . GERD (gastroesophageal reflux disease)   . Headache(784.0)     "1-2 times/month; comes from stress & allergies"  . Migraine     "maybe twice in the last 10 yrs" (10/16/2013)  . Bursitis of left hip   . History of stomach ulcers    Current Outpatient Prescriptions  Medication Sig Dispense Refill  . acetaminophen (TYLENOL) 500 MG tablet Take 500 mg by mouth every 6 (six) hours as needed for moderate pain.    Marland Kitchen albuterol (PROVENTIL HFA;VENTOLIN HFA) 108 (90 BASE) MCG/ACT inhaler Inhale 2 puffs into the lungs every 6 (six) hours as needed for wheezing.     . hydrochlorothiazide (HYDRODIURIL) 25 MG tablet Take 1 tablet (25 mg total) by mouth daily. 30 tablet 1  . lisinopril (PRINIVIL,ZESTRIL) 20 MG tablet Take 0.5 tablets (10 mg total) by mouth daily. 30 tablet 2  . oxyCODONE-acetaminophen (PERCOCET/ROXICET) 5-325 MG per tablet Take 1 tablet by mouth every 6 (six) hours as needed for severe pain. 20 tablet 0  . potassium chloride 20 MEQ TBCR Take 20 mEq by mouth daily. 30 tablet 1  .  promethazine (PHENERGAN) 12.5 MG tablet Take 1 tablet (12.5 mg total) by mouth every 6 (six) hours as needed for nausea. 10 tablet 0   No current facility-administered medications for this visit.   No family history on file. History   Social History  . Marital Status: Single    Spouse Name: N/A    Number of Children: N/A  . Years of Education: N/A   Social History Main Topics  . Smoking status: Former Smoker -- 1 years    Types: Cigarettes  . Smokeless tobacco: Never Used  . Alcohol Use: 0.0 oz/week    0 Not specified per week     Comment: 10/16/2013 "glass of wine q 6 months or so"  . Drug Use: No  . Sexual Activity: Yes   Other Topics Concern  . None   Social History Narrative   Review of Systems: Pertinent items are noted in HPI. Objective:  Physical Exam: Filed Vitals:   08/12/14 0852  BP: 152/80  Pulse: 86  Temp: 98.5 F (36.9 C)  TempSrc: Oral  Height: 5\' 1"  (1.549 m)  Weight: 187 lb 1.6 oz (84.868 kg)  SpO2: 100%   General: sitting in chair, NAD HEENT: PERRL, EOMI, no scleral icterus Cardiac: RRR, no rubs, murmurs or gallops Pulm: clear to auscultation bilaterally, moving normal volumes of air Abd: soft, nontender, nondistended, BS present Ext: warm and well perfused, no pedal edema Neuro: alert and oriented  X3, cranial nerves II-XII grossly intact  Assessment & Plan:  Please see problem oriented charting  Pt discussed with Dr. Dareen Piano

## 2014-08-13 NOTE — Progress Notes (Signed)
INTERNAL MEDICINE TEACHING ATTENDING ADDENDUM - Jamichael Knotts, MD: I reviewed and discussed at the time of visit with the resident Dr. Sadek, the patient's medical history, physical examination, diagnosis and results of pertinent tests and treatment and I agree with the patient's care as documented.  

## 2014-08-16 LAB — ALDOSTERONE + RENIN ACTIVITY W/ RATIO
Aldosterone: <1 ng/dL
PRA LC/MS/MS: 0.44 ng/mL/h (ref 0.25–5.82)

## 2014-09-09 ENCOUNTER — Telehealth: Payer: Self-pay | Admitting: Internal Medicine

## 2014-09-09 NOTE — Telephone Encounter (Signed)
Call to patient to confirm appointment for 09/10/14 at 10:15 lmtcb

## 2014-09-10 ENCOUNTER — Ambulatory Visit (INDEPENDENT_AMBULATORY_CARE_PROVIDER_SITE_OTHER): Payer: 59 | Admitting: Internal Medicine

## 2014-09-10 ENCOUNTER — Encounter: Payer: Self-pay | Admitting: Internal Medicine

## 2014-09-10 VITALS — BP 160/92 | HR 90 | Temp 98.2°F | Ht 61.0 in | Wt 180.8 lb

## 2014-09-10 DIAGNOSIS — I10 Essential (primary) hypertension: Secondary | ICD-10-CM

## 2014-09-10 DIAGNOSIS — K5730 Diverticulosis of large intestine without perforation or abscess without bleeding: Secondary | ICD-10-CM

## 2014-09-10 NOTE — Progress Notes (Signed)
   Subjective:    Patient ID: [name], female    DOB: 01-29-1966, 49 y.o.   MRN: [id number]  HPI [name] is a 49 yr old woman with PMH of HTN, diverticulosis, presenting for follow up visit for her blood pressure. In addition she continues to have intermittent LLQ pain. She is also interested in finding out her BMET and renin/aldo from her [facility] visit last month.  She continues to skip many doses of her lisinopril and HCTZ, taking them 2-3 times per week. She tells me she forgets to take them and takes them at different times, HCTZ is taken at night sometimes leading to many trips to the bathroom to void.    Review of Systems  Constitutional: Negative for fever, chills, diaphoresis, activity change, appetite change and fatigue.  Respiratory: Negative for shortness of breath and wheezing.   Cardiovascular: Negative for chest pain and leg swelling.  Gastrointestinal: Positive for abdominal pain. Negative for nausea, vomiting, diarrhea, constipation and blood in stool.  Genitourinary: Negative for dysuria and difficulty urinating.  Musculoskeletal: Negative for back pain.  Neurological: Negative for dizziness and light-headedness.  Psychiatric/Behavioral: Negative for agitation.       Objective:   Physical Exam  Constitutional: She is oriented to person, place, and time. She appears well-developed and well-nourished. No distress.  Cardiovascular: Normal rate and regular rhythm.   Pulmonary/Chest: Effort normal. No respiratory distress. She has no wheezes. She has no rales.  Abdominal: Soft. Bowel sounds are normal. She exhibits no distension. There is tenderness. There is no rebound and no guarding.  LLQ mild TTP  Musculoskeletal: She exhibits no edema.  Neurological: She is alert and oriented to person, place, and time. Coordination normal.  Skin: Skin is warm and dry. She is not diaphoretic.  Psychiatric: She has a normal mood and affect.  Nursing note and vitals  reviewed.         Assessment & Plan:

## 2014-09-10 NOTE — Patient Instructions (Addendum)
General Instructions: -Start taking your blood pressure medications every day.  -You have been referred to a Gastroenterologist for further evaluation of your abdominal pain and diverticulosis. . -Once you have taken your blood pressure medications everyday for two weeks, make a follow up appointment with [place] so we can see if the medicines are working for you.    Please bring your medicines with you each time you come to clinic.  Medicines may include prescription medications, over-the-counter medications, herbal remedies, eye drops, vitamins, or other pills.   Progress Toward Treatment Goals:  No flowsheet data found.  Self Care Goals & Plans:  Self Care Goal 09/10/2014  Manage my medications take my medicines as prescribed; bring my medications to every visit; refill my medications on time; follow the sick day instructions if I am sick  Monitor my health keep track of my blood pressure; keep track of my weight  Eat healthy foods eat more vegetables; eat fruit for snacks and desserts; eat baked foods instead of fried foods; eat smaller portions; eat foods that are low in salt  Be physically active find an activity I enjoy    No flowsheet data found.   Care Management & Community Referrals:  No flowsheet data found.

## 2014-09-11 NOTE — Assessment & Plan Note (Addendum)
Has had intermittent LLQ pain. Has not been able to see GI (Medicaid card needed to be changed before she can be referred to GI). Denies melena/hematochezia, N/V/D. -Encouraged oral hydration -Declines need for pain medications at this time -Referred to GI --she never had colonoscopy

## 2014-09-11 NOTE — Assessment & Plan Note (Signed)
BP Readings from Last 3 Encounters:  09/10/14 160/92  08/12/14 152/80  08/06/14 163/82    Lab Results  Component Value Date   NA 139 08/12/2014   K 3.8 08/12/2014   CREATININE 0.58 08/12/2014    Assessment: Blood pressure control:  not controlled Progress toward BP goal:   not at goal Comments: Non compliant with medications. Has Rx for lisinopril 10mg  daily and HCTZ 25mg  daily but takes them 2-3 times per week.   Plan: Medications:  continue current medications, she was advised to take her medications every day in the morning  Educational resources provided: brochure, handout, video Self management tools provided:   Other plans: Follow up after two weeks of consistently taking her medications.

## 2014-09-13 ENCOUNTER — Other Ambulatory Visit: Payer: Self-pay

## 2014-09-13 NOTE — Progress Notes (Signed)
Internal Medicine Clinic Attending Date of visit: 09/10/2014  Case discussed with Dr. Hayes Ludwig soon after the resident saw the patient on the day of the visit .  We reviewed the resident's history and exam and pertinent patient test results.  I agree with the assessment, diagnosis, and plan of care documented in the resident's note.

## 2014-10-01 ENCOUNTER — Telehealth: Payer: Self-pay | Admitting: *Deleted

## 2014-10-01 NOTE — Telephone Encounter (Signed)
Talked w/Dr Rabbani. Pt needs to take Lisinopril 10 mg, consistently, daily then have BP check in 2 weeks. Called pt back and instructed her to do this - she voiced understanding. Also called [facility] - to refill Lisinopril 10 mg for pt.

## 2014-10-01 NOTE — Telephone Encounter (Signed)
Pt had left message for refill on Lisinopril ; I called [facility] who stated pt has 2 different rxs written last month for Lisinopril 10 mg. Pt called back - stated Lisinopril has been increased to 20 mg. Need new rx for Lisinopril 20mg . Thanks

## 2014-10-02 ENCOUNTER — Encounter: Payer: Self-pay | Admitting: *Deleted

## 2014-10-10 ENCOUNTER — Encounter: Payer: Self-pay | Admitting: Gastroenterology

## 2014-10-28 ENCOUNTER — Other Ambulatory Visit: Payer: Self-pay | Admitting: Internal Medicine

## 2014-10-31 ENCOUNTER — Telehealth: Payer: Self-pay | Admitting: *Deleted

## 2014-10-31 NOTE — Telephone Encounter (Signed)
Pt called and very unset about medication.  Not sure what dose to take, 2 Rx's were sent in for Lisinopril. She also wants to know why certain lab work was not done.  She went to another PCP doctor and was told different things were wrong with her.    You saw pt on 09/10/14 Pt # [phone]

## 2014-10-31 NOTE — Telephone Encounter (Signed)
Hi Gayle,    Could you route this message to her PCP, Dr. Naaman Plummer? Per Epic the pt should be on lisinopril 10mg  daily which I have not prescribed.   Thanks,   Randell Loop, MD

## 2014-11-01 NOTE — Telephone Encounter (Signed)
Dr. Naaman Plummer, can you touch base with this patient?  She has questions about lab work and medication. Pt # [phone]

## 2014-11-01 NOTE — Telephone Encounter (Signed)
I have never seen her before, so please schedule her for follow-up visit to discuss her questions and for blood pressure check. Also please let her know to take lisinopril 10 mg daily until that visit based on last clinic note.   Thanks Edd Fabian!  Dr. Naaman Plummer

## 2014-11-04 NOTE — Telephone Encounter (Signed)
Pt called to inform. No answer, message left to call clinic

## 2014-11-04 NOTE — Telephone Encounter (Signed)
Talked with pt.  She states she will schedule an appointment after she gets her schedule.

## 2014-12-06 ENCOUNTER — Ambulatory Visit (INDEPENDENT_AMBULATORY_CARE_PROVIDER_SITE_OTHER): Payer: Self-pay | Admitting: Gastroenterology

## 2014-12-06 ENCOUNTER — Encounter: Payer: Self-pay | Admitting: Gastroenterology

## 2014-12-06 VITALS — BP 158/90 | HR 86 | Ht 61.5 in | Wt 181.4 lb

## 2014-12-06 DIAGNOSIS — K5732 Diverticulitis of large intestine without perforation or abscess without bleeding: Secondary | ICD-10-CM

## 2014-12-06 MED ORDER — MOVIPREP 100 G PO SOLR: 1.0000 | Freq: Once | ORAL | Status: DC

## 2014-12-06 NOTE — Progress Notes (Signed)
HPI: This is a  very pleasant 49 year old woman   who was referred to me by Blain Pais, MD  to evaluate  diverticulitis, recurrent .    Chief complaint is recurrent diverticulitis  Was in hosp 2015 for acute diverticulitis.  Again in jan 2016.   Each time she went in with LLQ pains, no fevers, no bowel changes, but  Once with vomiting.  Normally no issues with bowels.  No overt bleeding.  Overall her weight has intentionally decreased (2-3 pounds)  Aunt had colon cancer many years ago.  Never had colonoscopy.  Ct 07/2014: Focal acute diverticulitis is noted at the junction of the descending and sigmoid colon. No abscess is seen at this time. CT 2015 essentially the same reading.  I reviewed both the images and reports of though both of those tests  Review of systems: Pertinent positive and negative review of systems were noted in the above HPI section. Complete review of systems was performed and was otherwise normal.   Past Medical History  Diagnosis Date  . Asthma   . Hypertension   . Anemia   . GERD (gastroesophageal reflux disease)   . Headache(784.0)     "1-2 times/month; comes from stress & allergies"  . Migraine     "maybe twice in the last 10 yrs" (10/16/2013)  . Bursitis of left hip   . History of stomach ulcers     Past Surgical History  Procedure Laterality Date  . Tonsillectomy and adenoidectomy  1983  . Cholecystectomy  ~ 2000  . Hernia repair    . Epigastric hernia repair  07/2003  . Bartholin gland cyst excision    . Incision and drainage abscess  10/2005    Bartholin abscess.  . Tubal ligation  1994  . Cesarean section  1989    Current Outpatient Prescriptions  Medication Sig Dispense Refill  . acetaminophen (TYLENOL) 500 MG tablet Take 500 mg by mouth every 6 (six) hours as needed for moderate pain.    Marland Kitchen albuterol (PROVENTIL HFA;VENTOLIN HFA) 108 (90 BASE) MCG/ACT inhaler Inhale 2 puffs into the lungs every 6 (six) hours as needed for  wheezing.     . hydrochlorothiazide (HYDRODIURIL) 25 MG tablet Take 1 tablet (25 mg total) by mouth daily. 30 tablet 1  . lisinopril (PRINIVIL,ZESTRIL) 10 MG tablet TAKE 1 TABLET BY MOUTH EVERY DAY 30 tablet 0  . oxyCODONE-acetaminophen (PERCOCET/ROXICET) 5-325 MG per tablet Take 1 tablet by mouth every 6 (six) hours as needed for severe pain. 20 tablet 0  . promethazine (PHENERGAN) 12.5 MG tablet Take 1 tablet (12.5 mg total) by mouth every 6 (six) hours as needed for nausea. 10 tablet 0   No current facility-administered medications for this visit.    Allergies as of 12/06/2014 - Review Complete 12/06/2014  Allergen Reaction Noted  . Amoxicillin Rash 10/15/2013  . Ampicillin Rash 05/24/2011  . Montelukast sodium Rash 05/24/2011    Family History  Problem Relation Age of Onset  . Cancer Mother     unknown  . Heart attack Father   . Heart attack Sister     History   Social History  . Marital Status: Single    Spouse Name: N/A  . Number of Children: N/A  . Years of Education: N/A   Occupational History  . residential worker    Social History Main Topics  . Smoking status: Former Smoker -- 1 years    Types: Cigarettes  . Smokeless tobacco: Never Used  .  Alcohol Use: 0.0 oz/week    0 Standard drinks or equivalent per week     Comment: 10/16/2013 "glass of wine q 6 months or so"  . Drug Use: No  . Sexual Activity: Yes   Other Topics Concern  . Not on file   Social History Narrative     Physical Exam: BP 158/90 mmHg  Pulse 86  Ht 5' 1.5" (1.562 m)  Wt 181 lb 6 oz (82.271 kg)  BMI 33.72 kg/m2 Constitutional: generally well-appearing Psychiatric: alert and oriented x3 Eyes: extraocular movements intact Mouth: oral pharynx moist, no lesions Neck: supple no lymphadenopathy Cardiovascular: heart regular rate and rhythm Lungs: clear to auscultation bilaterally Abdomen: soft, nontender, nondistended, no obvious ascites, no peritoneal signs, normal bowel  sounds Extremities: no lower extremity edema bilaterally Skin: no lesions on visible extremities   Assessment and plan: 49 y.o. female with  recurrent diverticulitis  I suspect that what she has truly had is indeed recurrent diverticulitis however we need to make sure there is nothing else going on at that site, particularly neoplasm. I recommended we proceed with colonoscopy at her soonest convenience. I see no further need for other blood tests or imaging studies prior to then. If she indeed it is just having recurrent diverticulitis and it continues then she may benefit from elective resection of the affected area.   Owens Loffler, MD [facility] 12/06/2014, 2:52 PM  Cc: Blain Pais, MD

## 2014-12-06 NOTE — Patient Instructions (Signed)
You will be set up for a colonoscopy for abnormal CT scan, history of diverticulitis.

## 2014-12-12 ENCOUNTER — Ambulatory Visit (INDEPENDENT_AMBULATORY_CARE_PROVIDER_SITE_OTHER): Payer: PRIVATE HEALTH INSURANCE | Admitting: Internal Medicine

## 2014-12-12 ENCOUNTER — Ambulatory Visit: Payer: Self-pay | Admitting: Pharmacist

## 2014-12-12 ENCOUNTER — Encounter: Payer: Self-pay | Admitting: Pharmacist

## 2014-12-12 ENCOUNTER — Ambulatory Visit (HOSPITAL_COMMUNITY)
Admission: RE | Admit: 2014-12-12 | Discharge: 2014-12-12 | Disposition: A | Discharge status: Home / Self Care | Payer: Self-pay | Source: Ambulatory Visit | Attending: Internal Medicine | Admitting: Internal Medicine

## 2014-12-12 ENCOUNTER — Encounter: Payer: Self-pay | Admitting: Internal Medicine

## 2014-12-12 VITALS — BP 168/85 | HR 86 | Temp 98.5°F | Ht 61.0 in | Wt 184.1 lb

## 2014-12-12 DIAGNOSIS — I808 Phlebitis and thrombophlebitis of other sites: Secondary | ICD-10-CM

## 2014-12-12 DIAGNOSIS — I10 Essential (primary) hypertension: Secondary | ICD-10-CM

## 2014-12-12 DIAGNOSIS — I82612 Acute embolism and thrombosis of superficial veins of left upper extremity: Secondary | ICD-10-CM | POA: Insufficient documentation

## 2014-12-12 MED ORDER — LISINOPRIL 20 MG PO TABS: 10.0000 mg | ORAL_TABLET | Freq: Every day | ORAL | Status: DC

## 2014-12-12 MED ORDER — NAPROXEN SODIUM 220 MG PO TABS
220.0000 mg | ORAL_TABLET | Freq: Two times a day (BID) | ORAL | Status: DC
Start: 2014-12-12 — End: 2015-03-07

## 2014-12-12 NOTE — Progress Notes (Signed)
.  VASCULAR LAB PRELIMINARY  PRELIMINARY  PRELIMINARY  PRELIMINARY  Left upper extremity venous duplex completed.    Preliminary report: No evidence of left upper extremity DVT. There is a small isolated superficial thrombus in a branch of the basilic vein in the mid forearm.  Jaculin Rasmus, RVS 12/12/2014, 6:50 PM

## 2014-12-12 NOTE — Assessment & Plan Note (Signed)
BP Readings from Last 3 Encounters:  12/12/14 168/85  12/06/14 158/90  09/10/14 160/92    Lab Results  Component Value Date   NA 139 08/12/2014   K 3.8 08/12/2014   CREATININE 0.58 08/12/2014    Assessment: Blood pressure control: moderately elevatednot controlled Progress toward BP goal:  unchangednot at goal Comments: Non compliant with medications. [name] last too lisinopril 10mg  daily "a few days ago" and [name] has not taken HCTZ 25mg  daily in "a long time"  Plan: Medications:  Stop HCTZ. Increase lisinopril from 10 mg daily to 20 mg daily. Counseled how noncompliance issues. Educational resources provided:   Self management tools provided:   Other plans: Follow up after two weeks of consistently taking her medications. Will check a BMP at that time and possibly increase her lisinopril given more to 40 mg before adding more medications.

## 2014-12-12 NOTE — Patient Instructions (Signed)
Enoxaparin, Home Use Enoxaparin (Lovenox) injection is a medication used to prevent clots from developing in your veins. Medications such as enoxaparin are called blood thinners or anticoagulants. If blood clots are untreated they could travel to your lungs. This is called a pulmonary embolus. A blood clot in your lungs can be fatal. Caregivers often use anticoagulants such as enoxaparin to prevent clots following surgery. It is also used along with aspirin when the heart is not getting enough blood. Continue the enoxaparin injections as directed by your caregiver. Your caregiver will use blood clotting test results to decide when you can safely stop using enoxaparin injections. If your caregiver prescribes any additional anticoagulant, you must take it exactly as directed. RISKS AND COMPLICATIONS  If you have received recent epidural anesthesia, spinal anesthesia, or a spinal tap while receiving anticoagulants, you are at risk for developing a blood clot in or around the spine. This condition could result in long-term or permanent paralysis.  Because anticoagulants thin your blood, severe bleeding may occur from any tissue or organ. Symptoms of the blood being too thin may include:  Bleeding from the nose or gums that does not stop quickly.  Unusual bruising or bruising easily.  Swelling or pain at an injection site.  A cut that does not stop bleeding within 10 minutes.  Continual nausea for more than 1 day or vomiting blood.  Coughing up blood.  Blood in the urine which may appear as pink, red, or brown urine.  Blood in bowel movements which may appear as red, dark or black stools.  Sudden weakness or numbness of the face, arm, or leg, especially on one side of the body.  Sudden confusion.  Trouble speaking (aphasia) or understanding.  Sudden trouble seeing in one or both eyes.  Sudden trouble walking.  Dizziness.  Loss of balance or coordination.  Severe pain, such as a  headache, joint pain, or back pain.  Fever.  Bruising around the injection sites may be expected.  Platelet drops, known as "thrombocytopenia," can occur with enoxaparin use. A condition called "heparin-induced thrombocytopenia" has been seen. If you have had this condition, you should tell your caregiver. Your caregiver may direct you to have blood tests to monitor this condition.  Do not use if you have allergies to the medication, heparin, or pork products.  Other side effects may include mild local reactions or irritation at the site of injection, pain, bruising, and redness of skin. HOME CARE INSTRUCTIONS You will be instructed by your caregiver how to give enoxaparin injections. 1. Before giving your medication you should make sure the injection is a clear and colorless or pale yellow solution. If your medication becomes discolored or has particles in the bottle, do not use and notify your caregiver. 2. When using the 30 and 40 mg pre-filled syringes, do not expel the air bubble from the syringe before the injection. This makes sure you use all the medication in the syringe. 3. The injections will be given subcutaneously. This means it is given into the fat over the belly (abdomen). It is given deep beneath the skin but not into the muscle. The shots should be injected around the abdominal wall. Change the sites of injection each time. The whole length of the needle should be introduced into a skin fold held between the thumb and forefinger; the skin fold should be held throughout the injection. Do not rub the injection site after completion of the injection. This increases bruising. Enoxaparin injection pre-filled syringes  and graduated pre-filled syringes are available with a system that shields the needle after injection. 4. Inject by pushing the plunger to the bottom of the syringe. 5. Remove the syringe from the injection site keeping your finger on the plunger rod. Be careful not to  stick yourself or others. 6. After injection and the syringe is empty, set off the safety system by firmly pushing the plunger rod. The protective sleeve will automatically cover the needle and you can hear a click. The click means your needle is safely covered. Do not try replacing the needle shield. 7. Get rid of the syringe in the nearest sharps container. 8. Keep your medication safely stored at room temperatures.  Due to the complications of anticoagulants, it is very important that you take your anticoagulant as directed by your caregiver. Anticoagulants need to be taken exactly as instructed. Be sure you understand all your anticoagulant instructions.  Changes in medicines, supplements, diet, and illness can affect your anticoagulation therapy. Be sure to inform your caregivers of any of these changes.  While on anticoagulants, you will need to have blood tests done routinely as directed by your caregivers.  Be careful not to cut yourself when using sharp objects.  Limit physical activities or sports that could result in a fall or cause injury.  It is extremely important that you tell all of your caregivers and dentist that you are taking an anticoagulant, especially if you are injured or plan to have any type of procedure or operation.  Follow up with your laboratory test and caregiver appointments as directed. It is very important to keep your appointments. Not keeping appointments could result in a chronic or permanent injury, pain, or disability. SEEK MEDICAL CARE IF:  You develop any rashes.  You have any worsening of the condition for which you are receiving anticoagulation therapy. SEEK IMMEDIATE MEDICAL CARE IF:  Bleeding from the nose or gums does not stop quickly.  You have unusual bruising or are bruising easily.  Swelling or pain occurs at an injection site.  A cut does not stop bleeding within 10 minutes.  You have continual nausea for more than 1 day or are  vomiting blood.  You are coughing up blood.  You have blood in the urine.  You have dark or black stools.  You have sudden weakness or numbness of the face, arm, or leg, especially on one side of the body.  You have sudden confusion.  You have trouble speaking (aphasia) or understanding.  You have sudden trouble seeing in one or both eyes.  You have sudden trouble walking.  You have dizziness.  You have a loss of balance or coordination.  You have severe pain, such as a headache, joint pain, or back pain.  You have a serious fall or head injury, even if you are not bleeding.  You have an oral temperature above 102 F (38.9 C), not controlled by medicine. ANY OF THESE SYMPTOMS MAY REPRESENT A SERIOUS PROBLEM THAT IS AN EMERGENCY. Do not wait to see if the symptoms will go away. Get medical help right away. Call your local emergency services (911 in [place] DO NOT drive yourself to the hospital. MAKE SURE YOU:  Understand these instructions.  Will watch your condition.  Will get help right away if you are not doing well or get worse. Document Released: 04/22/2004 Document Revised: 09/13/2011 Document Reviewed: 06/21/2005 [facility] Patient Information 2015 [place], [place]. This information is not intended to replace advice given to you  by your health care provider. Make sure you discuss any questions you have with your health care provider.

## 2014-12-12 NOTE — Progress Notes (Signed)
Patient ID: [name], female   DOB: 1966/02/03, 49 y.o.   MRN: [id number]  Medication Samples have been provided to the patient.  Drug: enoxaparin Strength: 120 mg Qty: 5 LOT: [id number] Exp.Date: 02/2016  Samples signed for by Dr. Mathis Dad patient on injection technique including preparation, administration, disposal, indication, dosing, possible adverse effects, and evaluating for signs/symptoms of bleeding/thromboembolism. An education handout was provided.  Patient did correctly demonstrate injection technique.  Patient was advised to follow up if any changes in condition or questions regarding medications arise.   The patient verbalized understanding of information provided by repeating back concepts discussed.   15 minutes spent face-to-face with the patient during the encounter. 100% of time spent on education.  Kim,Jennifer J 7:12 PM 12/12/2014

## 2014-12-12 NOTE — Assessment & Plan Note (Addendum)
Assessment: Most likely diagnosis superficial phlebitis involving the left basilic vein. Differentials include superficial venous thrombosis. She has no family or personal history of DVTs. She is not on oral and quadrants and she doesn't smoke.  Plan: We will proceed with urgent left upper extremity venous Doppler to rule out a blood clot.  If we find extensive phlebitis, will provide Lovenox (120mg  daily) for 6 weeks and if there is a clot will provide Lovenox for 12 weeks.  If she does not have either of these (limited phlebitis), will treat her with non-steroidal anti-inflammatory for 2 weeks and local therapy for pain. Renal function is good.  Discussed case with our pharmacy team   12/16/2014 Addendum Left UE doppler reports: "There is a small localized superficial thrombus in a small branch of the basilic vein at the site of the knot in the left forearm". She was discharged on Lovenox to inject over the weekend.  Placed a call and left a voice mail with instructions to stop Lovenox since her presentation and finding from her doppler study is less concerning for DVT.  I instructed her to use Aleve for 2 weeks and follow up if symptoms worsen or if she has more question about her condition. Discussed with Dr Beryle Beams again today regarding the finding from the reports and the treatment plan.

## 2014-12-12 NOTE — Patient Instructions (Addendum)
General Instructions: Please take Aleve twice a day  We will obtain an emergent doppler ultrasound scan of your arm to make sure there is no blood clot As we wait for the results we will put you a blood thinner called lovenox Please increase you lisinopril to 20 mg daily  I will give you a call with the results from ultrasound scan  Please come back in 2 weeks   Please bring your medicines with you each time you come to clinic.  Medicines may include prescription medications, over-the-counter medications, herbal remedies, eye drops, vitamins, or other pills.   Progress Toward Treatment Goals:  Treatment Goal 12/12/2014  Blood pressure unchanged    Self Care Goals & Plans:  Self Care Goal 12/12/2014  Manage my medications refill my medications on time; bring my medications to every visit; take my medicines as prescribed  Monitor my health -  Eat healthy foods -  Be physically active -    No flowsheet data found.   Care Management & Community Referrals:  Referral 12/12/2014  Referrals made for care management support none needed     Enoxaparin, Home Use Enoxaparin (Lovenox) injection is a medication used to prevent clots from developing in your veins. Medications such as enoxaparin are called blood thinners or anticoagulants. If blood clots are untreated they could travel to your lungs. This is called a pulmonary embolus. A blood clot in your lungs can be fatal. Caregivers often use anticoagulants such as enoxaparin to prevent clots following surgery. It is also used along with aspirin when the heart is not getting enough blood. Continue the enoxaparin injections as directed by your caregiver. Your caregiver will use blood clotting test results to decide when you can safely stop using enoxaparin injections. If your caregiver prescribes any additional anticoagulant, you must take it exactly as directed. RISKS AND COMPLICATIONS  If you have received recent epidural anesthesia, spinal  anesthesia, or a spinal tap while receiving anticoagulants, you are at risk for developing a blood clot in or around the spine. This condition could result in long-term or permanent paralysis.  Because anticoagulants thin your blood, severe bleeding may occur from any tissue or organ. Symptoms of the blood being too thin may include:  Bleeding from the nose or gums that does not stop quickly.  Unusual bruising or bruising easily.  Swelling or pain at an injection site.  A cut that does not stop bleeding within 10 minutes.  Continual nausea for more than 1 day or vomiting blood.  Coughing up blood.  Blood in the urine which may appear as pink, red, or brown urine.  Blood in bowel movements which may appear as red, dark or black stools.  Sudden weakness or numbness of the face, arm, or leg, especially on one side of the body.  Sudden confusion.  Trouble speaking (aphasia) or understanding.  Sudden trouble seeing in one or both eyes.  Sudden trouble walking.  Dizziness.  Loss of balance or coordination.  Severe pain, such as a headache, joint pain, or back pain.  Fever.  Bruising around the injection sites may be expected.  Platelet drops, known as "thrombocytopenia," can occur with enoxaparin use. A condition called "heparin-induced thrombocytopenia" has been seen. If you have had this condition, you should tell your caregiver. Your caregiver may direct you to have blood tests to monitor this condition.  Do not use if you have allergies to the medication, heparin, or pork products.  Other side effects may include mild local  reactions or irritation at the site of injection, pain, bruising, and redness of skin. HOME CARE INSTRUCTIONS You will be instructed by your caregiver how to give enoxaparin injections. 1. Before giving your medication you should make sure the injection is a clear and colorless or pale yellow solution. If your medication becomes discolored or has  particles in the bottle, do not use and notify your caregiver. 2. When using the 30 and 40 mg pre-filled syringes, do not expel the air bubble from the syringe before the injection. This makes sure you use all the medication in the syringe. 3. The injections will be given subcutaneously. This means it is given into the fat over the belly (abdomen). It is given deep beneath the skin but not into the muscle. The shots should be injected around the abdominal wall. Change the sites of injection each time. The whole length of the needle should be introduced into a skin fold held between the thumb and forefinger; the skin fold should be held throughout the injection. Do not rub the injection site after completion of the injection. This increases bruising. Enoxaparin injection pre-filled syringes and graduated pre-filled syringes are available with a system that shields the needle after injection. 4. Inject by pushing the plunger to the bottom of the syringe. 5. Remove the syringe from the injection site keeping your finger on the plunger rod. Be careful not to stick yourself or others. 6. After injection and the syringe is empty, set off the safety system by firmly pushing the plunger rod. The protective sleeve will automatically cover the needle and you can hear a click. The click means your needle is safely covered. Do not try replacing the needle shield. 7. Get rid of the syringe in the nearest sharps container. 8. Keep your medication safely stored at room temperatures.  Due to the complications of anticoagulants, it is very important that you take your anticoagulant as directed by your caregiver. Anticoagulants need to be taken exactly as instructed. Be sure you understand all your anticoagulant instructions.  Changes in medicines, supplements, diet, and illness can affect your anticoagulation therapy. Be sure to inform your caregivers of any of these changes.  While on anticoagulants, you will need to  have blood tests done routinely as directed by your caregivers.  Be careful not to cut yourself when using sharp objects.  Limit physical activities or sports that could result in a fall or cause injury.  It is extremely important that you tell all of your caregivers and dentist that you are taking an anticoagulant, especially if you are injured or plan to have any type of procedure or operation.  Follow up with your laboratory test and caregiver appointments as directed. It is very important to keep your appointments. Not keeping appointments could result in a chronic or permanent injury, pain, or disability. SEEK MEDICAL CARE IF:  You develop any rashes.  You have any worsening of the condition for which you are receiving anticoagulation therapy. SEEK IMMEDIATE MEDICAL CARE IF:  Bleeding from the nose or gums does not stop quickly.  You have unusual bruising or are bruising easily.  Swelling or pain occurs at an injection site.  A cut does not stop bleeding within 10 minutes.  You have continual nausea for more than 1 day or are vomiting blood.  You are coughing up blood.  You have blood in the urine.  You have dark or black stools.  You have sudden weakness or numbness of the face, arm,  or leg, especially on one side of the body.  You have sudden confusion.  You have trouble speaking (aphasia) or understanding.  You have sudden trouble seeing in one or both eyes.  You have sudden trouble walking.  You have dizziness.  You have a loss of balance or coordination.  You have severe pain, such as a headache, joint pain, or back pain.  You have a serious fall or head injury, even if you are not bleeding.  You have an oral temperature above 102 F (38.9 C), not controlled by medicine. ANY OF THESE SYMPTOMS MAY REPRESENT A SERIOUS PROBLEM THAT IS AN EMERGENCY. Do not wait to see if the symptoms will go away. Get medical help right away. Call your local emergency  services (911 in [place] DO NOT drive yourself to the hospital. MAKE SURE YOU:  Understand these instructions.  Will watch your condition.  Will get help right away if you are not doing well or get worse. Document Released: 04/22/2004 Document Revised: 09/13/2011 Document Reviewed: 06/21/2005 [facility] Patient Information 2015 [place], [place]. This information is not intended to replace advice given to you by your health care provider. Make sure you discuss any questions you have with your health care provider.   Phlebitis Phlebitis is soreness and puffiness (swelling) in a vein.  HOME CARE  Only take medicine as told by your doctor.  Raise (elevate) the affected limb on a pillow as told by your doctor.  Keep a warm pack on the affected vein as told by your doctor. Do not sleep with a heating pad.  Use special stockings or bandages around the area of the affected vein as told by your doctor. These will speed healing and keep the condition from coming back.  Talk to your doctor about all the medicines you take.  Get follow-up blood tests as told by your doctor.  If the phlebitis is in your legs:  Avoid standing or resting for long periods.  Keep your legs moving. Raise your legs when you sit or lie.  Do not smoke.  Follow-up with your doctor as told. GET HELP IF: 9. You have strange bruises or bleeding. 10. Your puffiness or pain in the affected area is not getting better. 11. You are taking medicine to lessen puffiness (anti-inflammatory medicine), and you get belly pain. 12. You have a fever. GET HELP RIGHT AWAY IF:   The phlebitis gets worse and you have more pain, puffiness (swelling), or redness.  You have trouble breathing or have chest pain. MAKE SURE YOU:   Understand these instructions.  Will watch your condition.  Will get help right away if you are not doing well or get worse. Document Released: 06/09/2009 Document Revised: 06/26/2013 Document Reviewed:  02/26/2013 [facility] Patient Information 2015 [place], [place]. This information is not intended to replace advice given to you by your health care provider. Make sure you discuss any questions you have with your health care provider.

## 2014-12-12 NOTE — Progress Notes (Signed)
Patient ID: [name], female   DOB: Mar 24, 1966, 49 y.o.   MRN: [id number]   Subjective:   HPI: [name] is a 49 y.o. woman with past medical history below presents for an acute visit.  Reason(s) for visit:  Left arm pain: Patient reports 2 weeks history of pain on the upper left arm. She feels like she can palpate a cord around the medial aspect of her arm.Pain has been present constantly, sharp in nature, 6/10, has been interrupting her sleep. She denies any constitutional symptoms. She has no history of trauma to that arm. She has no history of fever, chills, or increased fatigue. She has no familial personal history of DVTs.   Kindly see the A&P for the status of the pt's chronic medical problems.  Past Medical History  Diagnosis Date  . Asthma   . Hypertension   . Anemia   . GERD (gastroesophageal reflux disease)   . Headache(784.0)     "1-2 times/month; comes from stress & allergies"  . Migraine     "maybe twice in the last 10 yrs" (10/16/2013)  . Bursitis of left hip   . History of stomach ulcers     ROS: Constitutional:  Denies fevers, chills, diaphoresis, appetite change and fatigue.  Respiratory: Denies SOB, DOE, cough, chest tightness, and wheezing.  CVS: No chest pain, palpitations and leg swelling.  GI: No abdominal pain, nausea, vomiting, bloody stools GU: No dysuria, frequency, hematuria, or flank pain.  MSK: No myalgias, back pain, joint swelling, arthralgias  Psych: No depression symptoms. No SI or SA.    Objective:  Physical Exam: Filed Vitals:   12/12/14 1553  BP: 168/85  Pulse: 86  Temp: 98.5 F (36.9 C)  TempSrc: Oral  Height: 5\' 1"  (1.549 m)  Weight: 184 lb 1.6 oz (83.507 kg)  SpO2: 100%   General: Well nourished. No acute distress.  HEENT: Normal oral mucosa. MMM.  Lungs: CTA bilaterally. No wheezing. Heart: RRR; no extra sounds or murmurs  Abdomen: Non-distended, normal bowel sounds, soft, nontender; no hepatosplenomegaly    Extremities: No pedal edema. No joint swelling or tenderness. Palpable cord along the location of left basilic vein. Right upper extremity is only slightly swollen around the cubital area, but without edema. No skin changes. No increased warmth or erythema. There is some tenderness and her left am but without palpable lymph nodes. Neurologic: Normal EOM,  Alert and oriented x3. No obvious neurologic/cranial nerve deficits.  Assessment & Plan:  Discussed case with Dr Ramond Marrow See problem based charting for assessment and plan.

## 2014-12-12 NOTE — Progress Notes (Signed)
Patient ID: [name], female   DOB: 04-10-1966, 49 y.o.   MRN: [id number]  Medication Samples have been provided to the patient.  Drug: enoxaparin Strength: 120 mg Qty: 5 LOT: [id number] Exp.Date: 02/2016  Samples signed for by Dr. Gaynelle Arabian J 7:12 PM 12/12/2014

## 2014-12-13 NOTE — Progress Notes (Signed)
Medicine attending: I personally interviewed and briefly examined this patient and reviewed pertinent  data together with resident physician Dr.Richard Alice Rieger and I discussed the management plan with him. She has a tender, palpable, cord medial aspect left arm from elbow to axilla. No axillary adenopathy. No prior history of thrombosis. This is likely a superficial phlebitis. We will begin therapeutic dose low molecular weight heparin pending venous doppler study to exclude DVT. For extensive superficial phlebitis, therapy can be abbreviated to 6 weeks; if deep vein involved, then standard 3 month course of anticoagulation indicated.

## 2015-01-31 ENCOUNTER — Encounter: Payer: PRIVATE HEALTH INSURANCE | Admitting: Gastroenterology

## 2015-02-03 ENCOUNTER — Other Ambulatory Visit: Payer: Self-pay | Admitting: *Deleted

## 2015-02-03 DIAGNOSIS — I808 Phlebitis and thrombophlebitis of other sites: Secondary | ICD-10-CM

## 2015-02-04 MED ORDER — LISINOPRIL 20 MG PO TABS: 20.0000 mg | ORAL_TABLET | Freq: Every day | ORAL | Status: DC

## 2015-02-21 ENCOUNTER — Ambulatory Visit: Payer: Self-pay | Admitting: Internal Medicine

## 2015-03-06 ENCOUNTER — Telehealth: Payer: Self-pay | Admitting: Internal Medicine

## 2015-03-06 NOTE — Telephone Encounter (Signed)
Call to patient to confirm appointment for 03/07/15 at 1:45 lmtcb

## 2015-03-07 ENCOUNTER — Ambulatory Visit (INDEPENDENT_AMBULATORY_CARE_PROVIDER_SITE_OTHER): Payer: PRIVATE HEALTH INSURANCE | Admitting: Internal Medicine

## 2015-03-07 ENCOUNTER — Encounter: Payer: Self-pay | Admitting: Internal Medicine

## 2015-03-07 VITALS — BP 165/88 | HR 83 | Temp 98.5°F | Ht 61.0 in | Wt 185.0 lb

## 2015-03-07 DIAGNOSIS — Z87891 Personal history of nicotine dependence: Secondary | ICD-10-CM | POA: Diagnosis not present

## 2015-03-07 DIAGNOSIS — Z23 Encounter for immunization: Secondary | ICD-10-CM | POA: Diagnosis not present

## 2015-03-07 DIAGNOSIS — Z0000 Encounter for general adult medical examination without abnormal findings: Secondary | ICD-10-CM

## 2015-03-07 DIAGNOSIS — D509 Iron deficiency anemia, unspecified: Secondary | ICD-10-CM | POA: Diagnosis not present

## 2015-03-07 DIAGNOSIS — J4540 Moderate persistent asthma, uncomplicated: Secondary | ICD-10-CM

## 2015-03-07 DIAGNOSIS — I10 Essential (primary) hypertension: Secondary | ICD-10-CM

## 2015-03-07 DIAGNOSIS — J309 Allergic rhinitis, unspecified: Secondary | ICD-10-CM

## 2015-03-07 MED ORDER — HYDROCHLOROTHIAZIDE 25 MG PO TABS: 25.0000 mg | ORAL_TABLET | Freq: Every day | ORAL | Status: DC

## 2015-03-07 MED ORDER — FERROUS SULFATE 325 (65 FE) MG PO TABS: 325.0000 mg | ORAL_TABLET | Freq: Every day | ORAL | Status: DC

## 2015-03-07 MED ORDER — AMLODIPINE BESYLATE 10 MG PO TABS: 10.0000 mg | ORAL_TABLET | Freq: Every day | ORAL | Status: DC

## 2015-03-07 MED ORDER — FLUTICASONE PROPIONATE 50 MCG/ACT NA SUSP: 2.0000 | Freq: Every day | NASAL | Status: DC

## 2015-03-07 MED ORDER — ALBUTEROL SULFATE HFA 108 (90 BASE) MCG/ACT IN AERS: 2.0000 | INHALATION_SPRAY | Freq: Four times a day (QID) | RESPIRATORY_TRACT | Status: DC | PRN

## 2015-03-07 MED ORDER — MOMETASONE FURO-FORMOTEROL FUM 100-5 MCG/ACT IN AERO: 2.0000 | INHALATION_SPRAY | Freq: Every day | RESPIRATORY_TRACT | Status: DC

## 2015-03-07 NOTE — Patient Instructions (Signed)
-  Stop taking lisinopril  -Start taking HCTZ 25 mg daily and norvasc 10 mg daily for high blood pressure -Start taking ferrous sulfate tablets daily for your anemia -Start using dulera inhaler 2 puffs daily for your asthma -Start taking flonase 2 sprays in each nostril and zyrtec daily for your allergies  -Will give you a flu shot today -Please come back in 2-4 weeks for blood pressure recheck and labwork -Very nice meeting you!  General Instructions:   Please bring your medicines with you each time you come to clinic.  Medicines may include prescription medications, over-the-counter medications, herbal remedies, eye drops, vitamins, or other pills.   Progress Toward Treatment Goals:  Treatment Goal 12/12/2014  Blood pressure unchanged    Self Care Goals & Plans:  Self Care Goal 03/07/2015  Manage my medications take my medicines as prescribed; bring my medications to every visit; refill my medications on time  Monitor my health keep track of my blood pressure  Eat healthy foods drink diet soda or water instead of juice or soda; eat more vegetables; eat foods that are low in salt; eat baked foods instead of fried foods  Be physically active take a walk every day    No flowsheet data found.   Care Management & Community Referrals:  Referral 12/12/2014  Referrals made for care management support none needed

## 2015-03-07 NOTE — Progress Notes (Signed)
Patient ID: [name], female   DOB: [birth year], 49 y.o.   MRN: [id number]    Subjective:   Patient ID: [name] female   DOB: 06/29/1966 49 y.o.   MRN: [id number]  HPI: [name] is a 49 y.o. pleasant woman with past medical history of hypertension, asthma, iron-deficiency anemia, hyperlipidemia, diverticulitis, migraine headaches, GERD, and AR who presents for routine follow-up.   She reports compliance with taking lisinopril 20 mg daily. She was at one time on HCTZ 25 mg daily. She has chronic headaches, blurry vision, and pedal edema but denies chest pain or lightheadedness.    She has history of asthma since childhood that used to be controlled but reports monthly flares since the beginning of this year with cough, wheezing, dyspnea, and night time symptoms every other night. She is out of her albuterol inhaler and is not on a daily inhaler. She denies recent steroid use. She reports her triggers are cold and humid weather in addition to allergic rhinitis which she is currently taking generic zyrtec. She is not using a nasal spray.   She has iron-deficiency anemia with last anemia panel on 1/61/16 most likely due to menorrhagia. She has not had evaluation for uterine fibroids. She is not currently taking iron supplementation because of difficulty in swallowing the tablets. She has chronic fatigeu, DOE, and pica. She is not on AP or AC therapy. She denies hematuria, BRBPR, or melena.   She would like flu shot today.     Past Medical History  Diagnosis Date  . Asthma   . Hypertension   . Anemia   . GERD (gastroesophageal reflux disease)   . Headache(784.0)     "1-2 times/month; comes from stress & allergies"  . Migraine     "maybe twice in the last 10 yrs" (10/16/2013)  . Bursitis of left hip   . History of stomach ulcers    Current Outpatient Prescriptions  Medication Sig Dispense Refill  . albuterol (PROVENTIL HFA;VENTOLIN HFA) 108 (90 BASE) MCG/ACT inhaler  Inhale 2 puffs into the lungs every 6 (six) hours as needed for wheezing.     Marland Kitchen lisinopril (PRINIVIL,ZESTRIL) 20 MG tablet Take 1 tablet (20 mg total) by mouth daily. 30 tablet 0  . naproxen sodium (ALEVE) 220 MG tablet Take 1 tablet (220 mg total) by mouth 2 (two) times daily with a meal. 30 tablet 0   No current facility-administered medications for this visit.   Family History  Problem Relation Age of Onset  . Cancer Mother     unknown  . Heart attack Father   . Heart attack Sister    Social History   Social History  . Marital Status: Single    Spouse Name: N/A  . Number of Children: N/A  . Years of Education: N/A   Occupational History  . residential worker    Social History Main Topics  . Smoking status: Former Smoker -- 1 years    Types: Cigarettes  . Smokeless tobacco: Never Used  . Alcohol Use: 0.0 oz/week    0 Standard drinks or equivalent per week     Comment: 10/16/2013 "glass of wine q 6 months or so"  . Drug Use: No  . Sexual Activity: Yes   Other Topics Concern  . Not on file   Social History Narrative   Review of Systems: Review of Systems  Constitutional: Positive for malaise/fatigue (chronic). Negative for fever, chills and weight loss.  HENT: Positive for congestion.  Eyes: Positive for blurred vision (chronic).  Respiratory: Positive for shortness of breath (with exertion). Negative for cough and wheezing.   Cardiovascular: Positive for leg swelling (right pedal edema). Negative for chest pain.  Gastrointestinal: Negative for nausea, vomiting, abdominal pain, diarrhea, constipation and blood in stool.  Genitourinary: Negative for dysuria, urgency, frequency and hematuria.  Musculoskeletal: Negative for myalgias.  Neurological: Positive for headaches. Negative for dizziness.  Endo/Heme/Allergies: Positive for environmental allergies. Negative for polydipsia.  Psychiatric/Behavioral: The patient has insomnia.      Objective:  Physical  Exam: Filed Vitals:   03/07/15 1357  BP: 165/88  Pulse: 83  Temp: 98.5 F (36.9 C)  TempSrc: Oral  Height: 5\' 1"  (1.549 m)  Weight: 185 lb (83.915 kg)  SpO2: 100%    Physical Exam  Constitutional: She is oriented to person, place, and time. She appears well-developed and well-nourished. No distress.  HENT:  Head: Normocephalic and atraumatic.  Right Ear: External ear normal.  Left Ear: External ear normal.  Nose: Nose normal.  Mouth/Throat: Oropharynx is clear and moist. No oropharyngeal exudate.  Eyes: Conjunctivae and EOM are normal. Pupils are equal, round, and reactive to light. Right eye exhibits no discharge. Left eye exhibits no discharge. No scleral icterus.  Neck: Normal range of motion. Neck supple.  Cardiovascular: Normal rate, regular rhythm and normal heart sounds.   Pulmonary/Chest: Effort normal and breath sounds normal. No respiratory distress. She has no wheezes. She has no rales.  Abdominal: Soft. Bowel sounds are normal. She exhibits no distension. There is no tenderness. There is no rebound and no guarding.  Musculoskeletal: Normal range of motion. She exhibits edema (trace right pedal ). She exhibits no tenderness.  Neurological: She is alert and oriented to person, place, and time.  Skin: Skin is warm and dry. No rash noted. She is not diaphoretic. No erythema. No pallor.  Psychiatric: She has a normal mood and affect. Her behavior is normal. Judgment and thought content normal.    Assessment & Plan:   Please see problem list for problem-based assessment and plan

## 2015-03-08 DIAGNOSIS — K219 Gastro-esophageal reflux disease without esophagitis: Secondary | ICD-10-CM | POA: Insufficient documentation

## 2015-03-08 DIAGNOSIS — Z0000 Encounter for general adult medical examination without abnormal findings: Secondary | ICD-10-CM | POA: Insufficient documentation

## 2015-03-08 DIAGNOSIS — Z1231 Encounter for screening mammogram for malignant neoplasm of breast: Secondary | ICD-10-CM | POA: Insufficient documentation

## 2015-03-08 DIAGNOSIS — J309 Allergic rhinitis, unspecified: Secondary | ICD-10-CM | POA: Insufficient documentation

## 2015-03-08 DIAGNOSIS — Z9049 Acquired absence of other specified parts of digestive tract: Secondary | ICD-10-CM | POA: Insufficient documentation

## 2015-03-08 NOTE — Assessment & Plan Note (Addendum)
-  Obtain A1c at next visit (history of prediabetes) -Pt received annual influenza vaccination today on 03/07/15 -Inquire regarding tdap vaccination, mammogram, and pap smear testing at next visit

## 2015-03-08 NOTE — Assessment & Plan Note (Addendum)
Assessment: Pt with uncontrolled hypertension compliant with one-class (ACEi) anti-hypertensive therapy who presents with blood pressure of 165/88.   Plan:  -BP 165/88 not at goal <140/90 -Due to Stage 2 hypertension, start HCTZ 25 mg daily and amlodipine 10 mg daily (pt is AA) -Discontinue lisinopril 20 mg daily (pt does not have CKD, CAD, or CHF) -Obtain CMP at next visit  -Pt to return in 4 weeks for blood pressure recheck

## 2015-03-08 NOTE — Assessment & Plan Note (Signed)
Assessment: Pt is a menstruating female with iron-deficiency anemia with last ferritin 27 on 08/04/14 not on iron supplementation who presents with no active bleeding or hemodynamic instability.    Plan:  -Prescribe ferrous sulfate 325 mg tabs daily  -Repeat CBC and anemia panel in 3 months to assess for response  -Consider US pelvis to assess for uterine fibroids

## 2015-03-08 NOTE — Assessment & Plan Note (Signed)
Assessment: Pt with allergic rhinitis compliant with anti-histamine therapy who presents with moderately controlled symptoms.   Plan:  -Prescribe flonase nasal spray 2 in each nostril daily  -Continue OTC zyrtec 10 mg daily

## 2015-03-08 NOTE — Assessment & Plan Note (Addendum)
Assessment: Pt with chronic persistent asthma who presents with monthly flares not currently on maintenance therapy.    Plan:  -Prescribe dulera 100-5 mcg 2 puffs daily  -Refill albuterol inhaler 2 puffs Q 6 hr PRN acute bronchospasm   -Prescribe flonase nasal spray 2 in each nostril daily and continue OTC zyrtec 10 mg daily for allergic rhinitis

## 2015-03-11 ENCOUNTER — Telehealth: Payer: Self-pay | Admitting: Internal Medicine

## 2015-03-11 DIAGNOSIS — J4521 Mild intermittent asthma with (acute) exacerbation: Secondary | ICD-10-CM

## 2015-03-11 NOTE — Telephone Encounter (Signed)
Pt states that the inhaler that is prescribed is going to cost over 300 wants to know what else she can get that is cheaper.

## 2015-03-11 NOTE — Progress Notes (Signed)
Internal Medicine Clinic Attending  Case discussed with Dr. Rabbani soon after the resident saw the patient.  We reviewed the resident's history and exam and pertinent patient test results.  I agree with the assessment, diagnosis, and plan of care documented in the resident's note.  

## 2015-03-11 NOTE — Telephone Encounter (Signed)
Will check with Dr. Maudie Mercury to look into this. I had prescribed her dulera.    Dr. Naaman Plummer

## 2015-03-11 NOTE — Addendum Note (Signed)
Addended by: Lalla Brothers T on: 03/11/2015 09:11 AM   Modules accepted: Level of Service

## 2015-03-12 MED ORDER — BUDESONIDE-FORMOTEROL FUMARATE 80-4.5 MCG/ACT IN AERO: 2.0000 | INHALATION_SPRAY | Freq: Two times a day (BID) | RESPIRATORY_TRACT | Status: DC

## 2015-03-12 NOTE — Telephone Encounter (Signed)
With Dr Julianne Rice help, inhaler changed from dulera to symbicort which should be about $30. Left pt voice message as there was no answer.   Dr. Naaman Plummer

## 2015-03-14 ENCOUNTER — Emergency Department (HOSPITAL_COMMUNITY)
Admission: EM | Admit: 2015-03-14 | Discharge: 2015-03-14 | Disposition: A | Discharge status: Home / Self Care | Payer: PRIVATE HEALTH INSURANCE | Attending: Emergency Medicine | Admitting: Emergency Medicine

## 2015-03-14 ENCOUNTER — Emergency Department (HOSPITAL_COMMUNITY): Payer: PRIVATE HEALTH INSURANCE

## 2015-03-14 ENCOUNTER — Encounter (HOSPITAL_COMMUNITY): Payer: Self-pay | Admitting: Emergency Medicine

## 2015-03-14 DIAGNOSIS — R42 Dizziness and giddiness: Secondary | ICD-10-CM | POA: Insufficient documentation

## 2015-03-14 DIAGNOSIS — G43909 Migraine, unspecified, not intractable, without status migrainosus: Secondary | ICD-10-CM | POA: Insufficient documentation

## 2015-03-14 DIAGNOSIS — J45909 Unspecified asthma, uncomplicated: Secondary | ICD-10-CM | POA: Insufficient documentation

## 2015-03-14 DIAGNOSIS — Z880 Allergy status to penicillin: Secondary | ICD-10-CM | POA: Diagnosis not present

## 2015-03-14 DIAGNOSIS — Z79899 Other long term (current) drug therapy: Secondary | ICD-10-CM | POA: Diagnosis not present

## 2015-03-14 DIAGNOSIS — I10 Essential (primary) hypertension: Secondary | ICD-10-CM | POA: Diagnosis not present

## 2015-03-14 DIAGNOSIS — Z87891 Personal history of nicotine dependence: Secondary | ICD-10-CM | POA: Insufficient documentation

## 2015-03-14 DIAGNOSIS — Z862 Personal history of diseases of the blood and blood-forming organs and certain disorders involving the immune mechanism: Secondary | ICD-10-CM | POA: Insufficient documentation

## 2015-03-14 DIAGNOSIS — K219 Gastro-esophageal reflux disease without esophagitis: Secondary | ICD-10-CM | POA: Insufficient documentation

## 2015-03-14 DIAGNOSIS — R079 Chest pain, unspecified: Secondary | ICD-10-CM

## 2015-03-14 LAB — BASIC METABOLIC PANEL
ANION GAP: 7 (ref 5–15)
BUN: 14 mg/dL (ref 6–20)
CO2: 30 mmol/L (ref 22–32)
Calcium: 10.3 mg/dL (ref 8.9–10.3)
Chloride: 100 mmol/L — ABNORMAL LOW (ref 101–111)
Creatinine, Ser: 0.69 mg/dL (ref 0.44–1.00)
GLUCOSE: 170 mg/dL — AB (ref 65–99)
POTASSIUM: 3.2 mmol/L — AB (ref 3.5–5.1)
Sodium: 137 mmol/L (ref 135–145)

## 2015-03-14 LAB — CBC
HEMATOCRIT: 35.4 % — AB (ref 36.0–46.0)
HEMOGLOBIN: 11.4 g/dL — AB (ref 12.0–15.0)
MCH: 26 pg (ref 26.0–34.0)
MCHC: 32.2 g/dL (ref 30.0–36.0)
MCV: 80.6 fL (ref 78.0–100.0)
Platelets: 274 10*3/uL (ref 150–400)
RBC: 4.39 MIL/uL (ref 3.87–5.11)
RDW: 14.8 % (ref 11.5–15.5)
WBC: 6.2 10*3/uL (ref 4.0–10.5)

## 2015-03-14 LAB — I-STAT TROPONIN, ED
TROPONIN I, POC: 0.02 ng/mL (ref 0.00–0.08)
TROPONIN I, POC: 0.02 ng/mL (ref 0.00–0.08)

## 2015-03-14 MED ORDER — NAPROXEN 500 MG PO TABS: 500.0000 mg | ORAL_TABLET | Freq: Two times a day (BID) | ORAL | Status: DC

## 2015-03-14 MED ORDER — ACETAMINOPHEN 325 MG PO TABS
650.0000 mg | ORAL_TABLET | Freq: Once | ORAL | Status: AC
  Administered 2015-03-14: 650 mg via ORAL
  Filled 2015-03-14: qty 2

## 2015-03-14 NOTE — ED Notes (Signed)
Pt from home for eval of ongoing substernal cp that started 4 days ago, pt also reports ongoing dizziness that gets worse with position changes. Pt states she called PCP but was told she needed to go to ED. Pt also reports BP reading of 190/100 when she started to feel bad again. Pt states she has been having recent med changes with her BP. nad noted. No neuro deficits noted.

## 2015-03-14 NOTE — ED Provider Notes (Signed)
CSN: [id number]     Arrival date & time 03/14/15  1746 History   First MD Initiated Contact with Patient 03/14/15 2209     Chief Complaint  Patient presents with  . Chest Pain  . Dizziness   HPI Pt has had 4 days of having chest pain, dizziness, and headache.  The pain is in the center of the chest.  It is constant.  She does feel short of breath when walking as well.  Today when she tried walking her heart started racing.  She checked her blood pressure and it was elevated.  She called her doctor who told her to come to the ED. She has also been having trouble with dizzness and headache.   Past Medical History  Diagnosis Date  . Asthma   . Hypertension   . Anemia   . GERD (gastroesophageal reflux disease)   . Headache(784.0)     "1-2 times/month; comes from stress & allergies"  . Migraine     "maybe twice in the last 10 yrs" (10/16/2013)  . Bursitis of left hip   . History of stomach ulcers    Past Surgical History  Procedure Laterality Date  . Tonsillectomy and adenoidectomy  1983  . Cholecystectomy  ~ 2000  . Hernia repair    . Epigastric hernia repair  07/2003  . Bartholin gland cyst excision    . Incision and drainage abscess  10/2005    Bartholin abscess.  . Tubal ligation  1994  . Cesarean section  1989   Family History  Problem Relation Age of Onset  . Cancer Mother     unknown  . Heart attack Father   . Heart attack Sister    Social History  Substance Use Topics  . Smoking status: Former Smoker -- 1 years    Types: Cigarettes  . Smokeless tobacco: Never Used  . Alcohol Use: 0.0 oz/week    0 Standard drinks or equivalent per week     Comment: 10/16/2013 "glass of wine q 6 months or so"   OB History    No data available     Review of Systems  All other systems reviewed and are negative.     Allergies  Amoxicillin; Ampicillin; and Montelukast sodium  Home Medications   Prior to Admission medications   Medication Sig Start Date End Date Taking?  Authorizing Provider  albuterol (PROVENTIL HFA;VENTOLIN HFA) 108 (90 BASE) MCG/ACT inhaler Inhale 2 puffs into the lungs every 6 (six) hours as needed for wheezing. 03/07/15  Yes Marjan Rabbani, MD  amLODipine (NORVASC) 10 MG tablet Take 1 tablet (10 mg total) by mouth daily. 03/07/15 03/06/16 Yes Marjan Rabbani, MD  cetirizine (ZYRTEC) 10 MG tablet Take 10 mg by mouth daily.   Yes Historical Provider, MD  ferrous sulfate 325 (65 FE) MG tablet Take 1 tablet (325 mg total) by mouth daily. 03/07/15 03/06/16 Yes Marjan Rabbani, MD  fluticasone (FLONASE) 50 MCG/ACT nasal spray Place 2 sprays into both nostrils daily. 03/07/15  Yes Juluis Mire, MD  hydrochlorothiazide (HYDRODIURIL) 25 MG tablet Take 1 tablet (25 mg total) by mouth daily. 03/07/15  Yes Juluis Mire, MD  budesonide-formoterol (SYMBICORT) 80-4.5 MCG/ACT inhaler Inhale 2 puffs into the lungs 2 (two) times daily. 03/12/15   Juluis Mire, MD  naproxen (NAPROSYN) 500 MG tablet Take 1 tablet (500 mg total) by mouth 2 (two) times daily. 03/14/15   Dorie Rank, MD   BP 151/81 mmHg  Pulse 77  Temp(Src) 98.8 F (37.1  C) (Oral)  Resp 15  Ht 5\' 1"  (1.549 m)  Wt 180 lb (81.647 kg)  BMI 34.03 kg/m2  SpO2 100%  LMP 03/08/2015 (Exact Date) Physical Exam  Constitutional: She appears well-developed and well-nourished. No distress.  HENT:  Head: Normocephalic and atraumatic.  Right Ear: External ear normal.  Left Ear: External ear normal.  Eyes: Conjunctivae are normal. Right eye exhibits no discharge. Left eye exhibits no discharge. No scleral icterus.  Neck: Neck supple. No tracheal deviation present.  Cardiovascular: Normal rate, regular rhythm and intact distal pulses.   Pulmonary/Chest: Effort normal and breath sounds normal. No stridor. No respiratory distress. She has no wheezes. She has no rales.  Abdominal: Soft. Bowel sounds are normal. She exhibits no distension. There is no tenderness. There is no rebound and no guarding.  Musculoskeletal: She  exhibits no edema or tenderness.  Neurological: She is alert. She has normal strength. No cranial nerve deficit (no facial droop, extraocular movements intact, no slurred speech) or sensory deficit. She exhibits normal muscle tone. She displays no seizure activity. Coordination normal.  Skin: Skin is warm and dry. No rash noted.  Psychiatric: She has a normal mood and affect.  Nursing note and vitals reviewed.   ED Course  Procedures (including critical care time) Labs Review Labs Reviewed  BASIC METABOLIC PANEL - Abnormal; Notable for the following:    Potassium 3.2 (*)    Chloride 100 (*)    Glucose, Bld 170 (*)    All other components within normal limits  CBC - Abnormal; Notable for the following:    Hemoglobin 11.4 (*)    HCT 35.4 (*)    All other components within normal limits  I-STAT TROPOININ, ED  Randolm Idol, ED    Imaging Review Dg Chest 2 View  03/14/2015   CLINICAL DATA:  substernal cp that started 4 days ago,  EXAM: CHEST  2 VIEW  COMPARISON:  06/13/2013  FINDINGS: The heart size and mediastinal contours are within normal limits. Both lungs are clear. The visualized skeletal structures are unremarkable.  IMPRESSION: No active cardiopulmonary disease.   Electronically Signed   By: Skipper Cliche M.D.   On: 03/14/2015 18:55   Ct Head Wo Contrast  03/14/2015   CLINICAL DATA:  Dizziness.  Headaches for 4 days  EXAM: CT HEAD WITHOUT CONTRAST  TECHNIQUE: Contiguous axial images were obtained from the base of the skull through the vertex without intravenous contrast.  COMPARISON:  03/21/2014  FINDINGS: No acute cortical infarct, hemorrhage, or mass lesion ispresent. Ventricles are of normal size. No significant extra-axial fluid collection is present. The paranasal sinuses andmastoid air cells are clear. The osseous skull is intact.  IMPRESSION: Negative exam.   Electronically Signed   By: Kerby Moors M.D.   On: 03/14/2015 23:22   I have personally reviewed and evaluated  these images and lab results as part of my medical decision-making.   EKG Interpretation   Date/Time:  Friday March 14 2015 17:57:32 EDT Ventricular Rate:  94 PR Interval:  172 QRS Duration: 76 QT Interval:  362 QTC Calculation: 452 R Axis:   92 Text Interpretation:  Normal sinus rhythm Rightward axis Nonspecific T  wave abnormality Abnormal ECG No significant change since last tracing  Confirmed by Meeka Cartelli  MD-J, Shevawn Langenberg ([id number]) on 03/14/2015 11:33:36 PM      MDM   Final diagnoses:  Chest pain, unspecified chest pain type    Pt has had constant pain for days.  Nonspecific ekg  changes may be related to her HTN.  Doubt ischemia with two sets of normal cardiac enzymes.  Pt describes headache and dizziness.  Neuro exam normal.  Does have history of migraine headaches.  Doubt stroke, tia.  At this time there does not appear to be any evidence of an acute emergency medical condition and the patient appears stable for discharge with appropriate outpatient follow up.     Dorie Rank, MD 03/14/15 [phone]

## 2015-03-14 NOTE — Discharge Instructions (Signed)
Adenosine Stress Electrocardiography  An adenosine stress electrocardiography is a test used to detect heart disease (coronary artery disease). Adenosine is a medicine that makes the heart arteries react as if you are exercising. Adenosine is given with a radioactive tracer. The "tracer" is a safe radioactive substance that travels in the bloodstream to the heart arteries. Special imaging cameras detect the tracer and help find blocked arteries in the heart. This test may be done with or without treadmill exercise.   LET YOUR HEALTH CARE PROVIDER KNOW ABOUT:  · Allergies, including latex allergies.  · All prescription medicines you taking as well as all non-prescription and over-the-counter medicines, including herbs and vitamins.  · Use of steroids (by mouth or creams).  · Previous problems with anesthetics or novocaine.  · History of blood clots or bleeding problems.  · Previous surgery.  · Other health problems such as kidney or lung conditions.  · Possibility of pregnancy, if this applies.  RISKS AND COMPLICATIONS  You may develop chest discomfort, shortness of breath, sweating, or light-headedness during the test. On rare occasions, you could experience a heart attack or your heart may go into a very fast or irregular rhythm. This could cause you to collapse. To ensure your safety, your health care provider will supervise the test. Your blood pressure and electrocardiogram are constantly watched. The test team watches for and is able to treat any problems.  BEFORE THE PROCEDURE  · Do not eat or drink caffeine for 12 to 24 hours before the test. This includes all caffeinated beverages and food, such as pop, coffee (roasted, instant, decaffeinated roasted, decaffeinated instant), hot chocolate, tea, and all chocolate.  · Do not smoke on the day of your test. Smoking on the day of your test may change your test results.  · Do not eat anything 3 hours before the test or as recommended by your health care provider.  Eating may cause an unclear image and may also cause nausea. If you are diabetic, talk to your health care provider regarding your insulin coverage.  · Bring a list of all the medicines you are taking. Take your medicine as usual before the test except as told by the testing center.  · Wear comfortable clothing, such as a short-sleeve shirt and sweatpants. Do not wear an underwire bra or jewelry. A hospital gown can be provided.  · Shower before your appointment to reduce the spread of bacteria.  · You may want to bring a book to read because there are some waiting periods during the test.  · Your health care provider will go over the adenosine stress test with you, such as procedure protocol, what to expect, how long it will take, and results.  PROCEDURE   · An IV will be started in a vein in your hand or arm.  · Electrode patches will be placed on your chest. The electrodes are connected to a monitor so your heart rhythm and heart rate can be watched. Your blood pressure will also be monitored during the test.  · Two sets of images are usually taken of your heart. The images compare your heart at rest and when it is "stressed." This first image is a "resting" picture of your heart. The "resting" image is usually done before adenosine is given.  · Adenosine is given in the IV over a period of 4 to 6 minutes.  · After the adenosine is given, you will be monitored for a few minutes afterward to ensure   your heart rate, heart rhythm, and blood pressure are normal.  AFTER THE PROCEDURE   When your test is completed, you may be asked to schedule an office visit with your health care provider to discuss the test results, or your health care provider may choose to call you with the results.   Document Released: 08/29/2006 Document Revised: 11/05/2013 Document Reviewed: 10/06/2011  ExitCare® Patient Information ©2015 [employer]. This information is not intended to replace advice given to you by your health care provider.  Make sure you discuss any questions you have with your health care provider.

## 2015-03-23 ENCOUNTER — Encounter (HOSPITAL_COMMUNITY): Payer: Self-pay | Admitting: Emergency Medicine

## 2015-03-23 ENCOUNTER — Emergency Department (HOSPITAL_COMMUNITY)
Admission: EM | Admit: 2015-03-23 | Discharge: 2015-03-24 | Disposition: A | Discharge status: Home / Self Care | Payer: PRIVATE HEALTH INSURANCE | Attending: Emergency Medicine | Admitting: Emergency Medicine

## 2015-03-23 DIAGNOSIS — D649 Anemia, unspecified: Secondary | ICD-10-CM | POA: Diagnosis not present

## 2015-03-23 DIAGNOSIS — Z8719 Personal history of other diseases of the digestive system: Secondary | ICD-10-CM | POA: Diagnosis not present

## 2015-03-23 DIAGNOSIS — L03115 Cellulitis of right lower limb: Secondary | ICD-10-CM | POA: Diagnosis not present

## 2015-03-23 DIAGNOSIS — Z87891 Personal history of nicotine dependence: Secondary | ICD-10-CM | POA: Insufficient documentation

## 2015-03-23 DIAGNOSIS — Z7951 Long term (current) use of inhaled steroids: Secondary | ICD-10-CM | POA: Insufficient documentation

## 2015-03-23 DIAGNOSIS — Z8739 Personal history of other diseases of the musculoskeletal system and connective tissue: Secondary | ICD-10-CM | POA: Insufficient documentation

## 2015-03-23 DIAGNOSIS — R2241 Localized swelling, mass and lump, right lower limb: Secondary | ICD-10-CM | POA: Diagnosis present

## 2015-03-23 DIAGNOSIS — J45909 Unspecified asthma, uncomplicated: Secondary | ICD-10-CM | POA: Insufficient documentation

## 2015-03-23 DIAGNOSIS — Z791 Long term (current) use of non-steroidal anti-inflammatories (NSAID): Secondary | ICD-10-CM | POA: Insufficient documentation

## 2015-03-23 DIAGNOSIS — Z79899 Other long term (current) drug therapy: Secondary | ICD-10-CM | POA: Diagnosis not present

## 2015-03-23 DIAGNOSIS — I10 Essential (primary) hypertension: Secondary | ICD-10-CM | POA: Diagnosis not present

## 2015-03-23 MED ORDER — HYDROCODONE-ACETAMINOPHEN 7.5-325 MG/15ML PO SOLN
10.0000 mL | Freq: Once | ORAL | Status: AC
Start: 2015-03-23 — End: 2015-03-23
  Administered 2015-03-23: 10 mL via ORAL
  Filled 2015-03-23: qty 15

## 2015-03-23 MED ORDER — HYDROCODONE-ACETAMINOPHEN 7.5-325 MG/15ML PO SOLN: 10.0000 mL | Freq: Four times a day (QID) | ORAL | Status: DC | PRN

## 2015-03-23 MED ORDER — CLINDAMYCIN PALMITATE HCL 75 MG/5ML PO SOLR
300.0000 mg | Freq: Once | ORAL | Status: AC
  Administered 2015-03-24: 300 mg via ORAL
  Filled 2015-03-23: qty 20

## 2015-03-23 MED ORDER — CLINDAMYCIN PALMITATE HCL 75 MG/5ML PO SOLR: 300.0000 mg | Freq: Three times a day (TID) | ORAL | Status: DC

## 2015-03-23 NOTE — ED Provider Notes (Signed)
CSN: [id number]     Arrival date & time 03/23/15  2159 History  This chart was scribed for non-physician practitioner, Alvina Chou, PA-C, working with Noemi Chapel, MD by Evelene Croon, ED Scribe. This patient was seen in room WTR8/WTR8 and the patient's care was started at 11:31 PM.   Chief Complaint  Patient presents with  . possible insect bite     The history is provided by the patient. No language interpreter was used.     HPI Comments:  [name] is a 49 y.o. female who presents to the Emergency Department complaining of "knot" to her RLE for four days. She reports associated moderate swelling and pain to the RLE. Pt suspects she may have been bitten by an insect but is unsure. She denies fever and chills. No alleviating factors noted. Past Medical History  Diagnosis Date  . Asthma   . Hypertension   . Anemia   . GERD (gastroesophageal reflux disease)   . Headache(784.0)     "1-2 times/month; comes from stress & allergies"  . Migraine     "maybe twice in the last 10 yrs" (10/16/2013)  . Bursitis of left hip   . History of stomach ulcers    Past Surgical History  Procedure Laterality Date  . Tonsillectomy and adenoidectomy  1983  . Cholecystectomy  ~ 2000  . Hernia repair    . Epigastric hernia repair  07/2003  . Bartholin gland cyst excision    . Incision and drainage abscess  10/2005    Bartholin abscess.  . Tubal ligation  1994  . Cesarean section  1989   Family History  Problem Relation Age of Onset  . Cancer Mother     unknown  . Heart attack Father   . Heart attack Sister    Social History  Substance Use Topics  . Smoking status: Former Smoker -- 1 years    Types: Cigarettes  . Smokeless tobacco: Never Used  . Alcohol Use: 0.0 oz/week    0 Standard drinks or equivalent per week     Comment: 10/16/2013 "glass of wine q 6 months or so"   OB History    No data available     Review of Systems  Constitutional: Negative for fever and chills.   Cardiovascular: Positive for leg swelling.  Musculoskeletal: Positive for myalgias.  All other systems reviewed and are negative.  Allergies  Amoxicillin; Ampicillin; and Montelukast sodium  Home Medications   Prior to Admission medications   Medication Sig Start Date End Date Taking? Authorizing Provider  albuterol (PROVENTIL HFA;VENTOLIN HFA) 108 (90 BASE) MCG/ACT inhaler Inhale 2 puffs into the lungs every 6 (six) hours as needed for wheezing. 03/07/15   Juluis Mire, MD  amLODipine (NORVASC) 10 MG tablet Take 1 tablet (10 mg total) by mouth daily. 03/07/15 03/06/16  Juluis Mire, MD  budesonide-formoterol (SYMBICORT) 80-4.5 MCG/ACT inhaler Inhale 2 puffs into the lungs 2 (two) times daily. 03/12/15   Juluis Mire, MD  cetirizine (ZYRTEC) 10 MG tablet Take 10 mg by mouth daily.    Historical Provider, MD  ferrous sulfate 325 (65 FE) MG tablet Take 1 tablet (325 mg total) by mouth daily. 03/07/15 03/06/16  Juluis Mire, MD  fluticasone (FLONASE) 50 MCG/ACT nasal spray Place 2 sprays into both nostrils daily. 03/07/15   Juluis Mire, MD  hydrochlorothiazide (HYDRODIURIL) 25 MG tablet Take 1 tablet (25 mg total) by mouth daily. 03/07/15   Juluis Mire, MD  naproxen (NAPROSYN) 500 MG tablet  Take 1 tablet (500 mg total) by mouth 2 (two) times daily. 03/14/15   Dorie Rank, MD   BP 153/93 mmHg  Pulse 76  Temp(Src) 98.4 F (36.9 C) (Oral)  Resp 20  SpO2 100%  LMP 03/08/2015 (Exact Date) Physical Exam  Constitutional: She is oriented to person, place, and time. She appears well-developed and well-nourished. No distress.  HENT:  Head: Normocephalic and atraumatic.  Eyes: Conjunctivae and EOM are normal.  Neck: Normal range of motion.  Cardiovascular: Normal rate and regular rhythm.  Exam reveals no gallop and no friction rub.   No murmur heard. Pulmonary/Chest: Effort normal and breath sounds normal. She has no wheezes. She has no rales. She exhibits no tenderness.  Abdominal: Soft. She  exhibits no distension. There is no tenderness.  Musculoskeletal: Normal range of motion.  Neurological: She is alert and oriented to person, place, and time.  Speech is goal-oriented. Moves limbs without ataxia.   Skin: Skin is warm and dry.  Psychiatric: She has a normal mood and affect. Her behavior is normal.  Nursing note and vitals reviewed.   ED Course  Procedures   DIAGNOSTIC STUDIES:  Oxygen Saturation is 100% on RA, normal by my interpretation.    COORDINATION OF CARE:  11:33 PM Discussed treatment plan with pt at bedside and pt agreed to plan.  Labs Review Labs Reviewed - No data to display  Imaging Review No results found. I have personally reviewed and evaluated these images and lab results as part of my medical decision-making.   EKG Interpretation None      MDM   Final diagnoses:  Cellulitis of right lower extremity    Patient has cellulitis of right lower extremity. Patient will have clindamycin and hycet for symptoms. Vitals stable and patient afebrile. Patient instructed to return with worsening or concerning symptoms.   I personally performed the services described in this documentation, which was scribed in my presence. The recorded information has been reviewed and is accurate.    Alvina Chou, PA-C 03/24/15 0329  Noemi Chapel, MD 03/25/15 [phone]

## 2015-03-23 NOTE — ED Notes (Addendum)
Pt from from home c/o bump to the right lower leg since Wednesday. Redness present. Pt is concerned for a spider bite. 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

## 2015-03-23 NOTE — Discharge Instructions (Signed)
Take Clindamycin as directed until gone. Take Hycet as needed for pain. Refer to attached documents for more information. Return to the ED with worsening or concerning symptoms.

## 2015-03-24 MED ORDER — CLINDAMYCIN HCL 300 MG PO CAPS: 300.0000 mg | ORAL_CAPSULE | Freq: Three times a day (TID) | ORAL | Status: DC

## 2015-03-24 NOTE — Progress Notes (Addendum)
ED CM consulted by [facility] ED CM when pt called concerning clindamycin cost Pt seen at [facility] ED on 03/24/15 dx cellulitis of right lower extremity. Patient will have clindamycin and hycet for symptoms Pt prefers  [facility] [phone]   1561 CM spoke with pharmacist, Shiny [phone] at [facility] after consulting with EDP  [place] to change liquid medication to po clindamycin 300 mg vs liquid Pt has received change in medication and obtained her rx No issue with liquid hycet Rx her insurance approved liquid hycet

## 2015-04-04 ENCOUNTER — Ambulatory Visit (INDEPENDENT_AMBULATORY_CARE_PROVIDER_SITE_OTHER): Payer: PRIVATE HEALTH INSURANCE | Admitting: Internal Medicine

## 2015-04-04 ENCOUNTER — Encounter: Payer: Self-pay | Admitting: Internal Medicine

## 2015-04-04 VITALS — BP 141/82 | HR 89 | Temp 98.3°F | Wt 183.1 lb

## 2015-04-04 DIAGNOSIS — Z0000 Encounter for general adult medical examination without abnormal findings: Secondary | ICD-10-CM

## 2015-04-04 DIAGNOSIS — Z23 Encounter for immunization: Secondary | ICD-10-CM

## 2015-04-04 DIAGNOSIS — I10 Essential (primary) hypertension: Secondary | ICD-10-CM | POA: Diagnosis not present

## 2015-04-04 DIAGNOSIS — R7309 Other abnormal glucose: Secondary | ICD-10-CM | POA: Diagnosis not present

## 2015-04-04 DIAGNOSIS — R7303 Prediabetes: Secondary | ICD-10-CM

## 2015-04-04 LAB — POCT GLYCOSYLATED HEMOGLOBIN (HGB A1C): HEMOGLOBIN A1C: 6.1

## 2015-04-04 LAB — GLUCOSE, CAPILLARY: GLUCOSE-CAPILLARY: 147 mg/dL — AB (ref 65–99)

## 2015-04-04 NOTE — Patient Instructions (Signed)
-  Great job on improving your blood pressure!!  -Will check your bloodwork today and call you with the results  -Will give a tetanus shot today and schedule a mammogram  -Please make an appointment for a pap smear    General Instructions:   Please bring your medicines with you each time you come to clinic.  Medicines may include prescription medications, over-the-counter medications, herbal remedies, eye drops, vitamins, or other pills.   Progress Toward Treatment Goals:  Treatment Goal 12/12/2014  Blood pressure unchanged    Self Care Goals & Plans:  Self Care Goal 03/07/2015  Manage my medications take my medicines as prescribed; bring my medications to every visit; refill my medications on time  Monitor my health keep track of my blood pressure  Eat healthy foods drink diet soda or water instead of juice or soda; eat more vegetables; eat foods that are low in salt; eat baked foods instead of fried foods  Be physically active take a walk every day    No flowsheet data found.   Care Management & Community Referrals:  Referral 12/12/2014  Referrals made for care management support none needed

## 2015-04-04 NOTE — Progress Notes (Signed)
Patient ID: [name], female   DOB: 09-01-1965, 48 y.o.   MRN: [id number]    Subjective:   Patient ID: [name] female   DOB: May 20, 1966 49 y.o.   MRN: [id number]  HPI: [name] is a 49 y.o. [name] is a 49 y.o. pleasant woman with past medical history of hypertension, asthma, iron-deficiency anemia, hyperlipidemia, diverticulitis, migraine headaches, GERD, and AR who presents for follow-up of uncontrolled hypertension.   At last visit on 9/2 she was started on HCTZ and amlodipine which she reports compliance with. She has chronic headaches, blurry vision, and occasional LE edema but denies chest pain or lightheadedness.   She has history of prediabetes with last A1c of 6.4 on 08/04/14. She denies polydipsia, polyuria, or polyphagia. She tries to follow a healthy diet and exercise.   She would like to have tdap vaccination today. She has not had recent mammogram or pap smear. She was supposed to have colonoscopy in setting of diverticulitis but due to insurance has not had this done yet.     Past Medical History  Diagnosis Date  . Asthma   . Hypertension   . Anemia   . GERD (gastroesophageal reflux disease)   . Headache(784.0)     "1-2 times/month; comes from stress & allergies"  . Migraine     "maybe twice in the last 10 yrs" (10/16/2013)  . Bursitis of left hip   . History of stomach ulcers    Current Outpatient Prescriptions  Medication Sig Dispense Refill  . albuterol (PROVENTIL HFA;VENTOLIN HFA) 108 (90 BASE) MCG/ACT inhaler Inhale 2 puffs into the lungs every 6 (six) hours as needed for wheezing. 1 Inhaler 3  . amLODipine (NORVASC) 10 MG tablet Take 1 tablet (10 mg total) by mouth daily. 30 tablet 2  . budesonide-formoterol (SYMBICORT) 80-4.5 MCG/ACT inhaler Inhale 2 puffs into the lungs 2 (two) times daily. 1 Inhaler 11  . cetirizine (ZYRTEC) 10 MG tablet Take 10 mg by mouth daily.    . clindamycin (CLEOCIN) 300 MG capsule Take 1 capsule (300 mg  total) by mouth 3 (three) times daily. X 7 days 30 capsule 0  . clindamycin (CLEOCIN) 75 MG/5ML solution Take 20 mLs (300 mg total) by mouth 3 (three) times daily. 600 mL 0  . ferrous sulfate 325 (65 FE) MG tablet Take 1 tablet (325 mg total) by mouth daily. 30 tablet 3  . fluticasone (FLONASE) 50 MCG/ACT nasal spray Place 2 sprays into both nostrils daily. 16 g 1  . hydrochlorothiazide (HYDRODIURIL) 25 MG tablet Take 1 tablet (25 mg total) by mouth daily. 30 tablet 2  . HYDROcodone-acetaminophen (HYCET) 7.5-325 mg/15 ml solution Take 10 mLs by mouth every 6 (six) hours as needed for moderate pain. 120 mL 0  . naproxen (NAPROSYN) 500 MG tablet Take 1 tablet (500 mg total) by mouth 2 (two) times daily. 30 tablet 0   No current facility-administered medications for this visit.   Family History  Problem Relation Age of Onset  . Cancer Mother     unknown  . Heart attack Father   . Heart attack Sister    Social History   Social History  . Marital Status: Single    Spouse Name: N/A  . Number of Children: N/A  . Years of Education: N/A   Occupational History  . residential worker    Social History Main Topics  . Smoking status: Former Smoker -- 1 years    Types: Cigarettes  .  Smokeless tobacco: Never Used  . Alcohol Use: 0.0 oz/week    0 Standard drinks or equivalent per week     Comment: 10/16/2013 "glass of wine q 6 months or so"  . Drug Use: No  . Sexual Activity: Yes   Other Topics Concern  . Not on file   Social History Narrative   Review of Systems: Review of Systems  Eyes: Positive for blurred vision (chronic).  Respiratory: Negative for cough, shortness of breath and wheezing.   Cardiovascular: Positive for leg swelling (occasionally ). Negative for chest pain.  Gastrointestinal: Positive for constipation. Negative for nausea, vomiting, abdominal pain and diarrhea.       Dark stools after starting iron  Genitourinary: Negative for dysuria, urgency, frequency and  hematuria.  Skin:       Recent right LE bug bite with treated cellulitis   Neurological: Positive for headaches (chronic). Negative for dizziness.  Endo/Heme/Allergies: Negative for polydipsia.     Objective:  Physical Exam: Filed Vitals:   04/04/15 1437  BP: 141/82  Pulse: 89  Temp: 98.3 F (36.8 C)  TempSrc: Oral  Weight: 183 lb 1.6 oz (83.054 kg)  SpO2: 100%    Physical Exam  Constitutional: She is oriented to person, place, and time. She appears well-developed and well-nourished. No distress.  HENT:  Head: Normocephalic and atraumatic.  Right Ear: External ear normal.  Left Ear: External ear normal.  Nose: Nose normal.  Mouth/Throat: Oropharynx is clear and moist. No oropharyngeal exudate.  Eyes: Conjunctivae and EOM are normal. Pupils are equal, round, and reactive to light. Right eye exhibits no discharge. Left eye exhibits no discharge. No scleral icterus.  Neck: Normal range of motion. Neck supple.  Cardiovascular: Normal rate, regular rhythm and normal heart sounds.   No murmur heard. Pulmonary/Chest: Effort normal and breath sounds normal. No respiratory distress. She has no wheezes. She has no rales.  Abdominal: Soft. Bowel sounds are normal. She exhibits no distension. There is no tenderness. There is no rebound and no guarding.  Musculoskeletal: Normal range of motion. She exhibits no edema or tenderness.  Neurological: She is alert and oriented to person, place, and time.  Skin: Skin is warm and dry. No rash noted. She is not diaphoretic. No erythema. No pallor.  Psychiatric: She has a normal mood and affect. Her behavior is normal. Judgment and thought content normal.    Assessment & Plan:   Please see problem list for problem-based assessment and plan

## 2015-04-04 NOTE — Assessment & Plan Note (Addendum)
Assessment: Pt with uncontrolled hypertension compliant with recently started two-class (diuretic & CCB) anti-hypertensive therapy who presents with significantly improved blood pressure of 141/82  Plan:  -BP 141/82 near goal <140/90 -Continue HCTZ 25 mg daily and amlodipine 10 mg daily  -Obtain BMP   ADDENDUM On 04/05/15: Pt with persistent hypokalemia 3.2 despite increased potassium intake, will prescribe kdur 20 mEq daily in setting of newly started diuretic therapy.

## 2015-04-04 NOTE — Assessment & Plan Note (Signed)
-  Pt received tdap vaccination today on 04/04/15 -Order placed for screening mammogram -Pt declined colonoscopy (due to history of diverticulitis) due to finances at this time -Pt to return for pap smear

## 2015-04-04 NOTE — Assessment & Plan Note (Signed)
Assessment: Pt with last A1c of 6.4 on 08/04/14 who presents with CBG of 147 and improved A1c of 6.1.  Plan:  -Pt counseled on lifestyle modification  -Repeat A1c in 6 months ([name] 2017)

## 2015-04-05 LAB — BMP8+ANION GAP
Anion Gap: 17 mmol/L (ref 10.0–18.0)
BUN/Creatinine Ratio: 15 (ref 9–23)
BUN: 11 mg/dL (ref 6–24)
CALCIUM: 9.9 mg/dL (ref 8.7–10.2)
CHLORIDE: 93 mmol/L — AB (ref 97–108)
CO2: 26 mmol/L (ref 18–29)
CREATININE: 0.73 mg/dL (ref 0.57–1.00)
GFR, EST AFRICAN AMERICAN: 113 mL/min/{1.73_m2} (ref >59)
GFR, EST NON AFRICAN AMERICAN: 98 mL/min/{1.73_m2} (ref >59)
GLUCOSE: 148 mg/dL — AB (ref 65–99)
Potassium: 3.2 mmol/L — ABNORMAL LOW (ref 3.5–5.2)
Sodium: 136 mmol/L (ref 134–144)

## 2015-04-05 MED ORDER — POTASSIUM CHLORIDE CRYS ER 20 MEQ PO TBCR: 20.0000 meq | EXTENDED_RELEASE_TABLET | Freq: Every day | ORAL | Status: DC

## 2015-04-05 NOTE — Progress Notes (Signed)
Medicine attending: Medical history, presenting problems, physical findings, and medications, reviewed with Dr Marjan Rabbani on the day of the patient visit and I concur with her evaluation and management plan. 

## 2015-04-05 NOTE — Addendum Note (Signed)
Addended byJuluis Mire on: 04/05/2015 12:02 PM   Modules accepted: Orders

## 2015-04-14 ENCOUNTER — Ambulatory Visit (INDEPENDENT_AMBULATORY_CARE_PROVIDER_SITE_OTHER): Payer: PRIVATE HEALTH INSURANCE | Admitting: *Deleted

## 2015-04-14 DIAGNOSIS — Z111 Encounter for screening for respiratory tuberculosis: Secondary | ICD-10-CM | POA: Diagnosis not present

## 2015-04-14 DIAGNOSIS — Z0000 Encounter for general adult medical examination without abnormal findings: Secondary | ICD-10-CM

## 2015-04-16 LAB — TB SKIN TEST
Induration: >0 mm
TB Skin Test: NEGATIVE

## 2015-06-06 ENCOUNTER — Ambulatory Visit (INDEPENDENT_AMBULATORY_CARE_PROVIDER_SITE_OTHER): Payer: PRIVATE HEALTH INSURANCE | Admitting: Internal Medicine

## 2015-06-06 VITALS — BP 167/75 | HR 87 | Temp 98.7°F | Wt 186.7 lb

## 2015-06-06 DIAGNOSIS — Z0000 Encounter for general adult medical examination without abnormal findings: Secondary | ICD-10-CM

## 2015-06-06 DIAGNOSIS — I10 Essential (primary) hypertension: Secondary | ICD-10-CM | POA: Diagnosis not present

## 2015-06-06 DIAGNOSIS — M542 Cervicalgia: Secondary | ICD-10-CM | POA: Diagnosis not present

## 2015-06-06 DIAGNOSIS — G8929 Other chronic pain: Secondary | ICD-10-CM
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
## 2015-06-06 NOTE — Patient Instructions (Addendum)
-  Will call you with your pap smear and lab results -Will refer you to sports medicine for your right arm -Hope you have a very nice holiday!  General Instructions:   Please bring your medicines with you each time you come to clinic.  Medicines may include prescription medications, over-the-counter medications, herbal remedies, eye drops, vitamins, or other pills.   Progress Toward Treatment Goals:  Treatment Goal 12/12/2014  Blood pressure unchanged    Self Care Goals & Plans:  Self Care Goal 03/07/2015  Manage my medications take my medicines as prescribed; bring my medications to every visit; refill my medications on time  Monitor my health keep track of my blood pressure  Eat healthy foods drink diet soda or water instead of juice or soda; eat more vegetables; eat foods that are low in salt; eat baked foods instead of fried foods  Be physically active take a walk every day    No flowsheet data found.   Care Management & Community Referrals:  Referral 12/12/2014  Referrals made for care management support none needed

## 2015-06-07 DIAGNOSIS — M542 Cervicalgia: Secondary | ICD-10-CM

## 2015-06-07 DIAGNOSIS — G8929 Other chronic pain: Secondary | ICD-10-CM | POA: Insufficient documentation

## 2015-06-07 LAB — BMP8+ANION GAP
Anion Gap: 14 mmol/L (ref 10.0–18.0)
BUN / CREAT RATIO: 16 (ref 9–23)
BUN: 10 mg/dL (ref 6–24)
CO2: 26 mmol/L (ref 18–29)
Calcium: 9.7 mg/dL (ref 8.7–10.2)
Chloride: 100 mmol/L (ref 97–106)
Creatinine, Ser: 0.63 mg/dL (ref 0.57–1.00)
GFR, EST AFRICAN AMERICAN: 124 mL/min/{1.73_m2} (ref >59)
GFR, EST NON AFRICAN AMERICAN: 107 mL/min/{1.73_m2} (ref >59)
Glucose: 88 mg/dL (ref 65–99)
POTASSIUM: 3.8 mmol/L (ref 3.5–5.2)
SODIUM: 140 mmol/L (ref 136–144)
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
## 2015-06-07 NOTE — Assessment & Plan Note (Signed)
-  Pap smear testing with HPV testing performed today on 06/06/15, pt declined STD or wet prep testing  -Pt to schedule mammogram soon

## 2015-06-07 NOTE — Assessment & Plan Note (Addendum)
Assessment: Pt with uncontrolled hypertension compliant with recently started two-class (diuretic & CCB) anti-hypertensive therapy who presents with blood pressure of 167/75.  Plan:  -BP 167/75 not at goal <140/90 however pt did not take HCTZ today -Continue HCTZ 25 mg daily and amlodipine 10 mg daily  -Obtain BMP ---> normal potassium, discontinue kdur as pt no longer taking

## 2015-06-09 LAB — CYTOLOGY - PAP

## 2015-06-10 NOTE — Progress Notes (Signed)
Internal Medicine Clinic Attending  Case discussed with Dr. Rabbani soon after the resident saw the patient.  We reviewed the resident's history and exam and pertinent patient test results.  I agree with the assessment, diagnosis, and plan of care documented in the resident's note.  

## 2015-06-17 ENCOUNTER — Other Ambulatory Visit: Payer: Self-pay | Admitting: Internal Medicine

## 2015-06-17 DIAGNOSIS — M542 Cervicalgia: Principal | ICD-10-CM

## 2015-06-17 DIAGNOSIS — G8929 Other chronic pain: Secondary | ICD-10-CM

## 2015-06-18 ENCOUNTER — Telehealth: Payer: Self-pay | Admitting: *Deleted

## 2015-06-18 NOTE — Telephone Encounter (Signed)
Left voice message for patient to call back with any information as to if she have seen a orthopedic doctor in the past, if so who or what office.

## 2015-06-20 ENCOUNTER — Telehealth: Payer: Self-pay | Admitting: *Deleted

## 2015-06-20 NOTE — Telephone Encounter (Signed)
CALLED PATIENT LEFT VOICE MESSAGE FOR PATIENT REGARDING HER APPOINTMENT  WITH [facility] ON 12-30-016 @ 11:30AM.  APPOINTMENT MAILED TO PATIENT.

## 2015-08-05 ENCOUNTER — Telehealth: Payer: Self-pay | Admitting: Internal Medicine

## 2015-08-05 NOTE — Telephone Encounter (Signed)
Tried to call pt, lm for rtc 

## 2015-08-05 NOTE — Telephone Encounter (Signed)
Pt called back, she will come to clinic tomorrow

## 2015-08-05 NOTE — Telephone Encounter (Signed)
Pt requesting copy of TB result and flu shot. Please call pt back.

## 2015-10-21 ENCOUNTER — Ambulatory Visit
Admission: RE | Admit: 2015-10-21 | Discharge: 2015-10-21 | Disposition: A | Discharge status: Home / Self Care | Payer: BLUE CROSS/BLUE SHIELD | Source: Ambulatory Visit | Attending: Internal Medicine | Admitting: Internal Medicine

## 2015-10-21 ENCOUNTER — Other Ambulatory Visit: Payer: Self-pay | Admitting: Internal Medicine

## 2015-10-21 DIAGNOSIS — Z1231 Encounter for screening mammogram for malignant neoplasm of breast: Secondary | ICD-10-CM | POA: Diagnosis not present

## 2015-10-21 DIAGNOSIS — Z0000 Encounter for general adult medical examination without abnormal findings: Secondary | ICD-10-CM

## 2015-10-21 DIAGNOSIS — R928 Other abnormal and inconclusive findings on diagnostic imaging of breast: Secondary | ICD-10-CM

## 2015-10-29 ENCOUNTER — Other Ambulatory Visit: Payer: Self-pay

## 2015-10-29 ENCOUNTER — Other Ambulatory Visit: Payer: Self-pay | Admitting: Internal Medicine

## 2015-10-29 DIAGNOSIS — R928 Other abnormal and inconclusive findings on diagnostic imaging of breast: Secondary | ICD-10-CM

## 2015-10-30 ENCOUNTER — Ambulatory Visit
Admission: RE | Admit: 2015-10-30 | Discharge: 2015-10-30 | Disposition: A | Discharge status: Home / Self Care | Payer: BLUE CROSS/BLUE SHIELD | Source: Ambulatory Visit | Attending: Internal Medicine | Admitting: Internal Medicine

## 2015-10-30 ENCOUNTER — Other Ambulatory Visit: Payer: Self-pay | Admitting: Internal Medicine

## 2015-10-30 DIAGNOSIS — R928 Other abnormal and inconclusive findings on diagnostic imaging of breast: Secondary | ICD-10-CM

## 2015-10-30 DIAGNOSIS — R921 Mammographic calcification found on diagnostic imaging of breast: Secondary | ICD-10-CM | POA: Diagnosis not present

## 2015-11-10 ENCOUNTER — Other Ambulatory Visit: Payer: Self-pay | Admitting: Internal Medicine

## 2015-11-10 NOTE — Telephone Encounter (Signed)
Pt needs appt, was to return in march [name], dr Naaman [place] has nothing left, pt needs new pcp asap

## 2015-11-13 NOTE — Telephone Encounter (Signed)
Patient was called this am, because Dr. Naaman [place] has a cancellation for tomorrow.  No answer, but did leave message asking her to please call me back asap.

## 2015-11-18 ENCOUNTER — Ambulatory Visit
Admission: RE | Admit: 2015-11-18 | Discharge: 2015-11-18 | Disposition: A | Discharge status: Home / Self Care | Payer: BLUE CROSS/BLUE SHIELD | Source: Ambulatory Visit | Attending: Internal Medicine | Admitting: Internal Medicine

## 2015-11-18 ENCOUNTER — Other Ambulatory Visit: Payer: Self-pay | Admitting: Internal Medicine

## 2015-11-18 DIAGNOSIS — R928 Other abnormal and inconclusive findings on diagnostic imaging of breast: Secondary | ICD-10-CM

## 2015-11-18 DIAGNOSIS — R921 Mammographic calcification found on diagnostic imaging of breast: Secondary | ICD-10-CM | POA: Diagnosis not present

## 2015-11-18 DIAGNOSIS — N6012 Diffuse cystic mastopathy of left breast: Secondary | ICD-10-CM

## 2015-11-18 DIAGNOSIS — D242 Benign neoplasm of left breast: Secondary | ICD-10-CM | POA: Diagnosis not present

## 2015-11-24 DIAGNOSIS — N6012 Diffuse cystic mastopathy of left breast: Secondary | ICD-10-CM | POA: Insufficient documentation

## 2015-11-26 ENCOUNTER — Encounter: Payer: Self-pay | Admitting: Internal Medicine

## 2015-11-26 ENCOUNTER — Ambulatory Visit: Payer: Self-pay | Admitting: General Surgery

## 2015-11-26 DIAGNOSIS — D242 Benign neoplasm of left breast: Secondary | ICD-10-CM | POA: Diagnosis not present

## 2015-11-26 NOTE — H&P (Signed)
[name] 11/26/2015 11:26 AM Location: [facility] Patient #: [id number] DOB: 1965-08-22 Single / Language: Cleophus Molt / Race: Black or African American Female  History of Present Illness Odis Hollingshead MD; 11/26/2015 11:53 AM) Patient words: NP breast biopsy.  The patient is a 50 year old female.   Note:She is referred by Dr. Rosana Hoes for consultation regarding a left breast papilloma. She underwent a mammogram which demonstrated an area of microcalcifications inner aspect left breast. Stereotactic biopsy was performed demonstrating the results below. Wider excision was recommended and she's been sent here for that. There is no family history of breast cancer. Risk factors were reviewed. She denies any nipple discharge. She denies breast masses.   Breast, left, needle core biopsy, lower inner - FIBROCYSTIC CHANGES WITH USUAL DUCTAL HYPERPLASIA, SMALL DUCTAL PAPILLOMA AND CALCIFICATIONS. - NO MALIGNANCY IDENTIFIED.  Other Problems Yehuda Mao, RMA; 11/26/2015 11:26 AM) Arthritis Asthma Diverticulosis Gastroesophageal Reflux Disease High blood pressure Umbilical Hernia Repair  Past Surgical History Yehuda Mao, RMA; 11/26/2015 11:26 AM) Breast Biopsy Left. Cesarean Section - 1 Gallbladder Surgery - Laparoscopic Tonsillectomy  Diagnostic Studies History Yehuda Mao, RMA; 11/26/2015 11:26 AM) Colonoscopy never Mammogram within last year Pap Smear 1-5 years ago  Allergies Shirlean Mylar Gwynn, RMA; 11/26/2015 11:27 AM) No Known Drug Allergies 11/26/2015  Medication History (Robin Gwynn, RMA; 11/26/2015 11:28 AM) AmLODIPine Besylate (10MG  Tablet, Oral) Active. HydroCHLOROthiazide (25MG  Tablet, Oral) Active. Symbicort (80-4.5MCG/ACT Aerosol, Inhalation) Active. ZyrTEC (10MG  Tablet, Oral) Active. Flonase Allergy Relief (50MCG/ACT Suspension, Nasal) Active. Medications Reconciled  Social History Yehuda Mao, RMA; 11/26/2015 11:26 AM) Alcohol  use Occasional alcohol use. Caffeine use Coffee. No drug use Tobacco use Never smoker.  Family History Yehuda Mao, RMA; 11/26/2015 11:26 AM) Arthritis Mother. Cancer Mother. Diabetes Mellitus Mother. Heart Disease Father. Hypertension Mother, Sister. Seizure disorder Son.  Pregnancy / Birth History Yehuda Mao, RMA; 11/26/2015 11:26 AM) Age at menarche 57 years. Gravida 3 Maternal age 52-20 Para 2 Regular periods     Review of Systems Shirlean Mylar Gwynn RMA; 11/26/2015 11:26 AM) General Not Present- Appetite Loss, Chills, Fatigue, Fever, Night Sweats, Weight Gain and Weight Loss. Skin Not Present- Change in Wart/Mole, Dryness, Hives, Jaundice, New Lesions, Non-Healing Wounds, Rash and Ulcer. HEENT Present- Seasonal Allergies and Sinus Pain. Not Present- Earache, Hearing Loss, Hoarseness, Nose Bleed, Oral Ulcers, Ringing in the Ears, Sore Throat, Visual Disturbances, Wears glasses/contact lenses and Yellow Eyes. Respiratory Not Present- Bloody sputum, Chronic Cough, Difficulty Breathing, Snoring and Wheezing. Breast Not Present- Breast Mass, Breast Pain, Nipple Discharge and Skin Changes. Cardiovascular Not Present- Chest Pain, Difficulty Breathing Lying Down, Leg Cramps, Palpitations, Rapid Heart Rate, Shortness of Breath and Swelling of Extremities. Gastrointestinal Not Present- Abdominal Pain, Bloating, Bloody Stool, Change in Bowel Habits, Chronic diarrhea, Constipation, Difficulty Swallowing, Excessive gas, Gets full quickly at meals, Hemorrhoids, Indigestion, Nausea, Rectal Pain and Vomiting. Female Genitourinary Not Present- Frequency, Nocturia, Painful Urination, Pelvic Pain and Urgency. Musculoskeletal Not Present- Back Pain, Joint Pain, Joint Stiffness, Muscle Pain, Muscle Weakness and Swelling of Extremities. Neurological Not Present- Decreased Memory, Fainting, Headaches, Numbness, Seizures, Tingling, Tremor, Trouble walking and Weakness. Psychiatric Not Present-  Anxiety, Bipolar, Change in Sleep Pattern, Depression, Fearful and Frequent crying. Endocrine Not Present- Cold Intolerance, Excessive Hunger, Hair Changes, Heat Intolerance, Hot flashes and New Diabetes. Hematology Not Present- Easy Bruising, Excessive bleeding, Gland problems, HIV and Persistent Infections.  Vitals (Robin Gwynn RMA; 11/26/2015 11:29 AM) 11/26/2015 11:28 AM Weight: 189.8 lb Height: 61in Body Surface Area: 1.85 m Body Mass  Index: 35.86 kg/m  Temp.: 97.96F  Pulse: 87 (Regular)  BP: 148/90 (Sitting, Left Arm, Standard)      Physical Exam Odis Hollingshead MD; 11/26/2015 11:56 AM)  The physical exam findings are as follows: Note:General: Overweight female in NAD. Pleasant and cooperative.  HEENT: [place]/AT, no facial masses  EYES: EOMI, no icterus  NECK: Supple, no obvious mass  CV: RRR, no murmur, no JVD.  CHEST: Breath sounds equal and clear. Respirations nonlabored.  BREASTS: Symmetrical in size. No dominant masses, nipple discharge or suspicious skin lesions. Small puncture wound medial aspect left breast  LYMPHATIC: No palpable cervical, supraclavicular, axillary adenopathy.  NEUROLOGIC: Alert and oriented, answers questions appropriately, normal gait and station.  PSYCHIATRIC: Normal mood, affect , and behavior.    Assessment & Plan Odis Hollingshead MD; 11/26/2015 11:52 AM)  PAPILLOMA OF LEFT BREAST (D24.2) Impression: Wider excision of the area has been recommended and she is in agreement with this. We went over the reasons for this.  Plan: Left breast lumpectomy after radioactive seed localization. I have explained the procedure, risks, and aftercare to her. Risks include but are not limited to bleeding, infection, wound problems, cosmetic deformity, anesthesia. She seems to [facility] and agrees with the plan.  Jackolyn Confer, MD

## 2015-12-29 ENCOUNTER — Other Ambulatory Visit: Payer: Self-pay | Admitting: General Surgery

## 2015-12-29 DIAGNOSIS — D242 Benign neoplasm of left breast: Secondary | ICD-10-CM

## 2015-12-29 DIAGNOSIS — R921 Mammographic calcification found on diagnostic imaging of breast: Secondary | ICD-10-CM | POA: Diagnosis not present

## 2015-12-30 ENCOUNTER — Ambulatory Visit: Payer: Self-pay | Admitting: General Surgery

## 2015-12-30 ENCOUNTER — Encounter: Payer: Self-pay | Admitting: *Deleted

## 2016-01-14 ENCOUNTER — Other Ambulatory Visit (HOSPITAL_COMMUNITY): Payer: Self-pay | Admitting: *Deleted

## 2016-01-14 NOTE — Pre-Procedure Instructions (Signed)
[name]  01/14/2016      [facility] - [place], [place] [address] Phone: [phone] Fax: [phone]  [id number] - [place], [place] [id number] [place] [id number] Phone: [phone] Fax: [phone]  [place], [place] - [name] [place] [address] Phone: [phone] Fax: [phone]    Your procedure is scheduled on Friday, January 23, 2016 at 7:30 AM.  Report to [facility] Entrance "A" Admitting Office at 5:30 AM.  Call this number if you have problems the morning of surgery: [phone]  Any questions prior to day of surgery, please call [phone] between 8 & 4 PM.   Remember:  Do not eat food or drink liquids after midnight Thursday, 01/22/16.  Take these medicines the morning of surgery with A SIP OF WATER: Amlodipine (Norvasc), Cetirizine (Zyrtec), Flonase, Symbicort inhaler, Albuterol inhaler - if needed (bring this inhaler with you the day of surgery)   Do not wear jewelry, make-up or nail polish.  Do not wear lotions, powders, or perfumes.  You may wear deoderant.  Do not shave 48 hours prior to surgery.    Do not bring valuables to the hospital.  [facility] is not responsible for any belongings or valuables.  Contacts, dentures or bridgework may not be worn into surgery.  Leave your suitcase in the car.  After surgery it may be brought to your room.  For patients admitted to the hospital, discharge time will be determined by your treatment team.  Patients discharged the day of surgery will not be allowed to drive home.   Special instructions: [facility] - Preparing for Surgery  Before surgery, you can play an important role.  Because skin is not sterile, your skin needs to be as free of germs as possible.  You  can reduce the number of germs on you skin by washing with CHG (chlorahexidine gluconate) soap before surgery.  CHG is an antiseptic cleaner which kills germs and bonds with the skin to continue killing germs even after washing.  Please DO NOT use if you have an allergy to CHG or antibacterial soaps.  If your skin becomes reddened/irritated stop using the CHG and inform your nurse when you arrive at Short Stay.  Do not shave (including legs and underarms) for at least 48 hours prior to the first CHG shower.  You may shave your face.  Please follow these instructions carefully:   1.  Shower with CHG Soap the night before surgery and the                                morning of Surgery.  2.  If you choose to wash your hair, wash your hair first as usual with your       normal shampoo.  3.  After you shampoo, rinse your hair and body thoroughly to remove the                      Shampoo.  4.  Use CHG as you would any other liquid soap.  You can apply chg directly       to the skin and wash gently with scrungie or a clean washcloth.  5.  Apply the  CHG Soap to your body ONLY FROM THE NECK DOWN.        Do not use on open wounds or open sores.  Avoid contact with your eyes, ears, mouth and genitals (private parts).  Wash genitals (private parts) with your normal soap.  6.  Wash thoroughly, paying special attention to the area where your surgery        will be performed.  7.  Thoroughly rinse your body with warm water from the neck down.  8.  DO NOT shower/wash with your normal soap after using and rinsing off       the CHG Soap.  9.  Pat yourself dry with a clean towel.            10.  Wear clean pajamas.            11.  Place clean sheets on your bed the night of your first shower and do not        sleep with pets.  Day of Surgery  Do not apply any lotions/deodorants the morning of surgery.  Please wear clean clothes to the hospital/surgery center.  Please read over the following fact sheets that  you were given. Pain Booklet, Coughing and Deep Breathing and Surgical Site Infection Prevention

## 2016-01-15 ENCOUNTER — Encounter (HOSPITAL_COMMUNITY): Payer: Self-pay

## 2016-01-15 ENCOUNTER — Encounter (HOSPITAL_COMMUNITY)
Admission: RE | Admit: 2016-01-15 | Discharge: 2016-01-15 | Disposition: A | Discharge status: Home / Self Care | Payer: BLUE CROSS/BLUE SHIELD | Source: Ambulatory Visit | Attending: General Surgery | Admitting: General Surgery

## 2016-01-15 DIAGNOSIS — Z01812 Encounter for preprocedural laboratory examination: Secondary | ICD-10-CM | POA: Diagnosis not present

## 2016-01-15 HISTORY — DX: Reserved for inherently not codable concepts without codable children: IMO0001

## 2016-01-15 HISTORY — DX: Personal history of other diseases of the digestive system: Z87.19

## 2016-01-15 LAB — CBC WITH DIFFERENTIAL/PLATELET
BASOS ABS: 0 10*3/uL (ref 0.0–0.1)
BASOS PCT: 1 %
Eosinophils Absolute: 0.3 10*3/uL (ref 0.0–0.7)
Eosinophils Relative: 6 %
HEMATOCRIT: 35.2 % — AB (ref 36.0–46.0)
Hemoglobin: 11.2 g/dL — ABNORMAL LOW (ref 12.0–15.0)
Lymphocytes Relative: 45 %
Lymphs Abs: 2.6 10*3/uL (ref 0.7–4.0)
MCH: 26.4 pg (ref 26.0–34.0)
MCHC: 31.8 g/dL (ref 30.0–36.0)
MCV: 83 fL (ref 78.0–100.0)
MONO ABS: 0.2 10*3/uL (ref 0.1–1.0)
Monocytes Relative: 4 %
NEUTROS ABS: 2.5 10*3/uL (ref 1.7–7.7)
NEUTROS PCT: 44 %
Platelets: 260 10*3/uL (ref 150–400)
RBC: 4.24 MIL/uL (ref 3.87–5.11)
RDW: 14.4 % (ref 11.5–15.5)
WBC: 5.7 10*3/uL (ref 4.0–10.5)

## 2016-01-15 LAB — COMPREHENSIVE METABOLIC PANEL
ALBUMIN: 3.6 g/dL (ref 3.5–5.0)
ALT: 16 U/L (ref 14–54)
AST: 17 U/L (ref 15–41)
Alkaline Phosphatase: 46 U/L (ref 38–126)
Anion gap: 9 (ref 5–15)
BILIRUBIN TOTAL: 0.3 mg/dL (ref 0.3–1.2)
BUN: 7 mg/dL (ref 6–20)
CHLORIDE: 101 mmol/L (ref 101–111)
CO2: 28 mmol/L (ref 22–32)
Calcium: 9.5 mg/dL (ref 8.9–10.3)
Creatinine, Ser: 0.63 mg/dL (ref 0.44–1.00)
GFR calc Af Amer: >60 mL/min (ref >60)
GFR calc non Af Amer: >60 mL/min (ref >60)
GLUCOSE: 181 mg/dL — AB (ref 65–99)
POTASSIUM: 3.4 mmol/L — AB (ref 3.5–5.1)
SODIUM: 138 mmol/L (ref 135–145)
TOTAL PROTEIN: 6.9 g/dL (ref 6.5–8.1)

## 2016-01-15 LAB — HCG, SERUM, QUALITATIVE: PREG SERUM: NEGATIVE

## 2016-01-15 LAB — NO BLOOD PRODUCTS

## 2016-01-15 NOTE — Progress Notes (Signed)
PCP at [facility] internal Med Denies ever seeing a cardiologist Denies any chest pain or fevers Denies ever having a card cath, stress test, or echo. Refusal of blood products faxed to blood bank.

## 2016-01-22 ENCOUNTER — Ambulatory Visit
Admission: RE | Admit: 2016-01-22 | Discharge: 2016-01-22 | Disposition: A | Discharge status: Home / Self Care | Payer: BLUE CROSS/BLUE SHIELD | Source: Ambulatory Visit | Attending: General Surgery | Admitting: General Surgery

## 2016-01-22 DIAGNOSIS — D242 Benign neoplasm of left breast: Secondary | ICD-10-CM

## 2016-01-23 ENCOUNTER — Ambulatory Visit (HOSPITAL_COMMUNITY): Payer: BLUE CROSS/BLUE SHIELD | Admitting: Certified Registered Nurse Anesthetist

## 2016-01-23 ENCOUNTER — Encounter (HOSPITAL_COMMUNITY)
Admission: RE | Disposition: A | Discharge status: Home / Self Care | Payer: Self-pay | Source: Ambulatory Visit | Attending: General Surgery

## 2016-01-23 ENCOUNTER — Emergency Department (HOSPITAL_COMMUNITY): Payer: BLUE CROSS/BLUE SHIELD

## 2016-01-23 ENCOUNTER — Encounter (HOSPITAL_COMMUNITY): Payer: Self-pay | Admitting: Surgery

## 2016-01-23 ENCOUNTER — Ambulatory Visit (HOSPITAL_COMMUNITY)
Admission: RE | Admit: 2016-01-23 | Discharge: 2016-01-23 | Disposition: A | Discharge status: Home / Self Care | Payer: BLUE CROSS/BLUE SHIELD | Source: Ambulatory Visit | Attending: General Surgery | Admitting: General Surgery

## 2016-01-23 ENCOUNTER — Observation Stay (HOSPITAL_COMMUNITY)
Admission: EM | Admit: 2016-01-23 | Discharge: 2016-01-24 | Disposition: A | Discharge status: Home / Self Care | Payer: BLUE CROSS/BLUE SHIELD | Attending: General Surgery | Admitting: General Surgery

## 2016-01-23 ENCOUNTER — Ambulatory Visit (HOSPITAL_COMMUNITY): Payer: BLUE CROSS/BLUE SHIELD | Admitting: Emergency Medicine

## 2016-01-23 ENCOUNTER — Encounter (HOSPITAL_COMMUNITY): Payer: Self-pay | Admitting: Emergency Medicine

## 2016-01-23 ENCOUNTER — Ambulatory Visit
Admission: RE | Admit: 2016-01-23 | Discharge: 2016-01-23 | Disposition: A | Discharge status: Home / Self Care | Payer: BLUE CROSS/BLUE SHIELD | Source: Ambulatory Visit | Attending: General Surgery | Admitting: General Surgery

## 2016-01-23 DIAGNOSIS — D242 Benign neoplasm of left breast: Secondary | ICD-10-CM

## 2016-01-23 DIAGNOSIS — Z9889 Other specified postprocedural states: Secondary | ICD-10-CM

## 2016-01-23 DIAGNOSIS — I10 Essential (primary) hypertension: Secondary | ICD-10-CM | POA: Diagnosis not present

## 2016-01-23 DIAGNOSIS — Z880 Allergy status to penicillin: Secondary | ICD-10-CM | POA: Insufficient documentation

## 2016-01-23 DIAGNOSIS — D649 Anemia, unspecified: Secondary | ICD-10-CM | POA: Diagnosis not present

## 2016-01-23 DIAGNOSIS — G8918 Other acute postprocedural pain: Secondary | ICD-10-CM

## 2016-01-23 DIAGNOSIS — Z87891 Personal history of nicotine dependence: Secondary | ICD-10-CM | POA: Insufficient documentation

## 2016-01-23 DIAGNOSIS — R112 Nausea with vomiting, unspecified: Secondary | ICD-10-CM | POA: Diagnosis not present

## 2016-01-23 DIAGNOSIS — N6012 Diffuse cystic mastopathy of left breast: Secondary | ICD-10-CM | POA: Diagnosis not present

## 2016-01-23 DIAGNOSIS — R1032 Left lower quadrant pain: Secondary | ICD-10-CM | POA: Diagnosis not present

## 2016-01-23 DIAGNOSIS — K219 Gastro-esophageal reflux disease without esophagitis: Secondary | ICD-10-CM | POA: Diagnosis not present

## 2016-01-23 DIAGNOSIS — R921 Mammographic calcification found on diagnostic imaging of breast: Secondary | ICD-10-CM | POA: Diagnosis not present

## 2016-01-23 HISTORY — DX: Anorexia: R63.0

## 2016-01-23 HISTORY — PX: BREAST LUMPECTOMY WITH RADIOACTIVE SEED LOCALIZATION: SHX6424

## 2016-01-23 LAB — URINALYSIS, ROUTINE W REFLEX MICROSCOPIC
BILIRUBIN URINE: NEGATIVE
Glucose, UA: 100 mg/dL — AB
Ketones, ur: 15 mg/dL — AB
Leukocytes, UA: NEGATIVE
Nitrite: NEGATIVE
PH: 7.5 (ref 5.0–8.0)
Protein, ur: 30 mg/dL — AB
SPECIFIC GRAVITY, URINE: 1.016 (ref 1.005–1.030)

## 2016-01-23 LAB — URINE MICROSCOPIC-ADD ON

## 2016-01-23 LAB — CBC
HCT: 35.7 % — ABNORMAL LOW (ref 36.0–46.0)
HEMOGLOBIN: 11.6 g/dL — AB (ref 12.0–15.0)
MCH: 26.5 pg (ref 26.0–34.0)
MCHC: 32.5 g/dL (ref 30.0–36.0)
MCV: 81.7 fL (ref 78.0–100.0)
PLATELETS: 298 10*3/uL (ref 150–400)
RBC: 4.37 MIL/uL (ref 3.87–5.11)
RDW: 13.8 % (ref 11.5–15.5)
WBC: 11.2 10*3/uL — ABNORMAL HIGH (ref 4.0–10.5)

## 2016-01-23 LAB — COMPREHENSIVE METABOLIC PANEL
ALBUMIN: 3.9 g/dL (ref 3.5–5.0)
ALK PHOS: 50 U/L (ref 38–126)
ALT: 14 U/L (ref 14–54)
AST: 17 U/L (ref 15–41)
Anion gap: 9 (ref 5–15)
BUN: 8 mg/dL (ref 6–20)
CALCIUM: 9.9 mg/dL (ref 8.9–10.3)
CO2: 27 mmol/L (ref 22–32)
CREATININE: 0.7 mg/dL (ref 0.44–1.00)
Chloride: 101 mmol/L (ref 101–111)
GFR calc Af Amer: >60 mL/min (ref >60)
GFR calc non Af Amer: >60 mL/min (ref >60)
GLUCOSE: 188 mg/dL — AB (ref 65–99)
Potassium: 3.1 mmol/L — ABNORMAL LOW (ref 3.5–5.1)
SODIUM: 137 mmol/L (ref 135–145)
Total Bilirubin: 0.5 mg/dL (ref 0.3–1.2)
Total Protein: 7.2 g/dL (ref 6.5–8.1)

## 2016-01-23 LAB — LIPASE, BLOOD: Lipase: 14 U/L (ref 11–51)

## 2016-01-23 LAB — POC URINE PREG, ED: Preg Test, Ur: NEGATIVE

## 2016-01-23 SURGERY — BREAST LUMPECTOMY WITH RADIOACTIVE SEED LOCALIZATION
Anesthesia: General | Site: Breast | Laterality: Left

## 2016-01-23 MED ORDER — MEPERIDINE HCL 25 MG/ML IJ SOLN: 6.2500 mg | INTRAMUSCULAR | Status: DC | PRN

## 2016-01-23 MED ORDER — HYDROMORPHONE HCL 1 MG/ML IJ SOLN
1.0000 mg | Freq: Once | INTRAMUSCULAR | Status: AC
  Administered 2016-01-23: 1 mg via INTRAVENOUS
  Filled 2016-01-23: qty 1

## 2016-01-23 MED ORDER — LIDOCAINE 2% (20 MG/ML) 5 ML SYRINGE
INTRAMUSCULAR | Status: AC
  Filled 2016-01-23: qty 5

## 2016-01-23 MED ORDER — HYDROCODONE-ACETAMINOPHEN 5-325 MG PO TABS: 1.0000 | ORAL_TABLET | ORAL | Status: DC | PRN

## 2016-01-23 MED ORDER — DEXAMETHASONE SODIUM PHOSPHATE 10 MG/ML IJ SOLN
INTRAMUSCULAR | Status: AC
  Filled 2016-01-23: qty 1

## 2016-01-23 MED ORDER — ACETAMINOPHEN 325 MG PO TABS
650.0000 mg | ORAL_TABLET | ORAL | Status: DC | PRN
  Administered 2016-01-23: 650 mg via ORAL
  Filled 2016-01-23 (×2): qty 2

## 2016-01-23 MED ORDER — MIDAZOLAM HCL 2 MG/2ML IJ SOLN
INTRAMUSCULAR | Status: AC
  Filled 2016-01-23: qty 2

## 2016-01-23 MED ORDER — BUPIVACAINE-EPINEPHRINE (PF) 0.25% -1:200000 IJ SOLN
INTRAMUSCULAR | Status: AC
  Filled 2016-01-23: qty 30

## 2016-01-23 MED ORDER — ACETAMINOPHEN 650 MG RE SUPP
650.0000 mg | RECTAL | Status: DC | PRN
  Filled 2016-01-23: qty 1

## 2016-01-23 MED ORDER — ROCURONIUM BROMIDE 50 MG/5ML IV SOLN
INTRAVENOUS | Status: AC
  Filled 2016-01-23: qty 1

## 2016-01-23 MED ORDER — MIDAZOLAM HCL 5 MG/5ML IJ SOLN
INTRAMUSCULAR | Status: DC | PRN
  Administered 2016-01-23: 2 mg via INTRAVENOUS

## 2016-01-23 MED ORDER — MIDAZOLAM HCL 10 MG/10ML IJ SOLN
1.0000 mg | Freq: Once | INTRAMUSCULAR | Status: AC
  Administered 2016-01-23: 1 mg via INTRAVENOUS

## 2016-01-23 MED ORDER — ACETAMINOPHEN 325 MG PO TABS
ORAL_TABLET | ORAL | Status: AC
  Filled 2016-01-23: qty 2

## 2016-01-23 MED ORDER — OXYCODONE HCL 5 MG PO TABS
5.0000 mg | ORAL_TABLET | ORAL | Status: DC | PRN
  Administered 2016-01-23 (×2): 10 mg via ORAL
  Filled 2016-01-23 (×2): qty 2

## 2016-01-23 MED ORDER — PROPOFOL 10 MG/ML IV BOLUS
INTRAVENOUS | Status: DC | PRN
  Administered 2016-01-23: 120 mg via INTRAVENOUS

## 2016-01-23 MED ORDER — IOPAMIDOL (ISOVUE-300) INJECTION 61%
INTRAVENOUS | Status: AC
  Administered 2016-01-23: 100 mL
  Filled 2016-01-23: qty 100

## 2016-01-23 MED ORDER — VANCOMYCIN HCL IN DEXTROSE 1-5 GM/200ML-% IV SOLN
1000.0000 mg | INTRAVENOUS | Status: AC
  Administered 2016-01-23: 1000 mg via INTRAVENOUS
  Filled 2016-01-23: qty 200

## 2016-01-23 MED ORDER — MORPHINE SULFATE (PF) 2 MG/ML IV SOLN
2.0000 mg | INTRAVENOUS | Status: DC | PRN
Start: 2016-01-23 — End: 2016-01-23
  Administered 2016-01-23: 4 mg via INTRAVENOUS
  Filled 2016-01-23: qty 2

## 2016-01-23 MED ORDER — ONDANSETRON HCL 4 MG/2ML IJ SOLN
INTRAMUSCULAR | Status: AC
  Filled 2016-01-23: qty 2

## 2016-01-23 MED ORDER — BUPIVACAINE-EPINEPHRINE 0.25% -1:200000 IJ SOLN
INTRAMUSCULAR | Status: DC | PRN
  Administered 2016-01-23: 10 mL

## 2016-01-23 MED ORDER — PHENYLEPHRINE HCL 10 MG/ML IJ SOLN
INTRAMUSCULAR | Status: DC | PRN
  Administered 2016-01-23: 40 ug via INTRAVENOUS

## 2016-01-23 MED ORDER — SODIUM CHLORIDE 0.9% FLUSH: 3.0000 mL | INTRAVENOUS | Status: DC | PRN

## 2016-01-23 MED ORDER — HYDROMORPHONE HCL 1 MG/ML IJ SOLN
INTRAMUSCULAR | Status: AC
  Filled 2016-01-23: qty 1

## 2016-01-23 MED ORDER — KETOROLAC TROMETHAMINE 30 MG/ML IJ SOLN
INTRAMUSCULAR | Status: AC
  Filled 2016-01-23: qty 1

## 2016-01-23 MED ORDER — KETOROLAC TROMETHAMINE 30 MG/ML IJ SOLN
30.0000 mg | Freq: Once | INTRAMUSCULAR | Status: AC
  Administered 2016-01-23: 30 mg via INTRAVENOUS

## 2016-01-23 MED ORDER — PANTOPRAZOLE SODIUM 40 MG PO TBEC
40.0000 mg | DELAYED_RELEASE_TABLET | Freq: Every day | ORAL | Status: DC
  Administered 2016-01-23: 40 mg via ORAL
  Filled 2016-01-23: qty 1

## 2016-01-23 MED ORDER — ONDANSETRON HCL 4 MG/2ML IJ SOLN: 4.0000 mg | Freq: Once | INTRAMUSCULAR | Status: DC | PRN

## 2016-01-23 MED ORDER — LACTATED RINGERS IV SOLN
INTRAVENOUS | Status: DC | PRN
  Administered 2016-01-23: 07:00:00 via INTRAVENOUS

## 2016-01-23 MED ORDER — SODIUM CHLORIDE 0.9 % IV SOLN
Freq: Once | INTRAVENOUS | Status: AC
  Administered 2016-01-23: 19:00:00 via INTRAVENOUS

## 2016-01-23 MED ORDER — FENTANYL CITRATE (PF) 250 MCG/5ML IJ SOLN
INTRAMUSCULAR | Status: AC
  Filled 2016-01-23: qty 5

## 2016-01-23 MED ORDER — GI COCKTAIL ~~LOC~~
30.0000 mL | Freq: Once | ORAL | Status: AC
Start: 2016-01-23 — End: 2016-01-23
  Administered 2016-01-23: 30 mL via ORAL
  Filled 2016-01-23: qty 30

## 2016-01-23 MED ORDER — PROPOFOL 10 MG/ML IV BOLUS
INTRAVENOUS | Status: AC
  Filled 2016-01-23: qty 20

## 2016-01-23 MED ORDER — OXYCODONE HCL 5 MG PO TABS
ORAL_TABLET | ORAL | Status: AC
  Filled 2016-01-23: qty 2

## 2016-01-23 MED ORDER — ONDANSETRON HCL 4 MG/2ML IJ SOLN
4.0000 mg | Freq: Once | INTRAMUSCULAR | Status: AC
  Administered 2016-01-23: 4 mg via INTRAVENOUS
  Filled 2016-01-23: qty 2

## 2016-01-23 MED ORDER — DEXAMETHASONE SODIUM PHOSPHATE 10 MG/ML IJ SOLN
INTRAMUSCULAR | Status: DC | PRN
  Administered 2016-01-23: 10 mg via INTRAVENOUS

## 2016-01-23 MED ORDER — ACETAMINOPHEN 500 MG PO TABS
1000.0000 mg | ORAL_TABLET | Freq: Four times a day (QID) | ORAL | Status: DC
  Filled 2016-01-23: qty 2

## 2016-01-23 MED ORDER — MORPHINE SULFATE (PF) 4 MG/ML IV SOLN
INTRAVENOUS | Status: AC
  Filled 2016-01-23: qty 1

## 2016-01-23 MED ORDER — HYDROMORPHONE HCL 1 MG/ML IJ SOLN
0.2500 mg | INTRAMUSCULAR | Status: DC | PRN
  Administered 2016-01-23 (×4): 0.5 mg via INTRAVENOUS

## 2016-01-23 MED ORDER — LIDOCAINE HCL (CARDIAC) 20 MG/ML IV SOLN
INTRAVENOUS | Status: DC | PRN
  Administered 2016-01-23: 40 mg via INTRAVENOUS
  Administered 2016-01-23: 100 mg via INTRAVENOUS

## 2016-01-23 MED ORDER — FENTANYL CITRATE (PF) 100 MCG/2ML IJ SOLN
INTRAMUSCULAR | Status: DC | PRN
  Administered 2016-01-23: 25 ug via INTRAVENOUS
  Administered 2016-01-23: 50 ug via INTRAVENOUS
  Administered 2016-01-23: 25 ug via INTRAVENOUS
  Administered 2016-01-23: 50 ug via INTRAVENOUS
  Administered 2016-01-23: 100 ug via INTRAVENOUS

## 2016-01-23 MED ORDER — ONDANSETRON HCL 4 MG/2ML IJ SOLN
INTRAMUSCULAR | Status: DC | PRN
  Administered 2016-01-23: 4 mg via INTRAVENOUS

## 2016-01-23 SURGICAL SUPPLY — 50 items
APL SKNCLS STERI-STRIP NONHPOA (GAUZE/BANDAGES/DRESSINGS) ×1
APPLIER CLIP 9.375 MED OPEN (MISCELLANEOUS)
APR CLP MED 9.3 20 MLT OPN (MISCELLANEOUS)
BENZOIN TINCTURE PRP APPL 2/3 (GAUZE/BANDAGES/DRESSINGS) ×1 IMPLANT
BINDER BREAST LRG (GAUZE/BANDAGES/DRESSINGS) IMPLANT
BINDER BREAST XLRG (GAUZE/BANDAGES/DRESSINGS) ×1 IMPLANT
BLADE SURG 15 STRL LF DISP TIS (BLADE) ×1 IMPLANT
BLADE SURG 15 STRL SS (BLADE) ×2
CANISTER SUCTION 2500CC (MISCELLANEOUS) IMPLANT
CHLORAPREP W/TINT 26ML (MISCELLANEOUS) ×2 IMPLANT
CLIP APPLIE 9.375 MED OPEN (MISCELLANEOUS) IMPLANT
CONT SPEC 4OZ CLIKSEAL STRL BL (MISCELLANEOUS) ×1 IMPLANT
COVER PROBE W GEL 5X96 (DRAPES) ×2 IMPLANT
COVER SURGICAL LIGHT HANDLE (MISCELLANEOUS) ×2 IMPLANT
DEVICE DUBIN SPECIMEN MAMMOGRA (MISCELLANEOUS) ×2 IMPLANT
DRAPE CHEST BREAST 15X10 FENES (DRAPES) ×2 IMPLANT
DRAPE UTILITY XL STRL (DRAPES) ×2 IMPLANT
ELECT CAUTERY BLADE 6.4 (BLADE) ×2 IMPLANT
ELECT REM PT RETURN 9FT ADLT (ELECTROSURGICAL) ×2
ELECTRODE REM PT RTRN 9FT ADLT (ELECTROSURGICAL) ×1 IMPLANT
GAUZE SPONGE 4X4 12PLY STRL (GAUZE/BANDAGES/DRESSINGS) ×1 IMPLANT
GLOVE BIOGEL PI IND STRL 7.0 (GLOVE) IMPLANT
GLOVE BIOGEL PI IND STRL 8 (GLOVE) ×1 IMPLANT
GLOVE BIOGEL PI INDICATOR 7.0 (GLOVE) ×1
GLOVE BIOGEL PI INDICATOR 8 (GLOVE) ×1
GLOVE ECLIPSE 8.0 STRL XLNG CF (GLOVE) ×2 IMPLANT
GOWN STRL REUS W/ TWL LRG LVL3 (GOWN DISPOSABLE) ×2 IMPLANT
GOWN STRL REUS W/TWL LRG LVL3 (GOWN DISPOSABLE) ×6
KIT BASIN OR (CUSTOM PROCEDURE TRAY) ×2 IMPLANT
KIT MARKER MARGIN INK (KITS) ×2 IMPLANT
NDL HYPO 25X1 1.5 SAFETY (NEEDLE) ×1 IMPLANT
NEEDLE HYPO 25X1 1.5 SAFETY (NEEDLE) ×2 IMPLANT
NS IRRIG 1000ML POUR BTL (IV SOLUTION) ×1 IMPLANT
PACK SURGICAL SETUP 50X90 (CUSTOM PROCEDURE TRAY) ×2 IMPLANT
PENCIL BUTTON HOLSTER BLD 10FT (ELECTRODE) ×2 IMPLANT
SPONGE LAP 18X18 X RAY DECT (DISPOSABLE) ×2 IMPLANT
STRIP CLOSURE SKIN 1/2X4 (GAUZE/BANDAGES/DRESSINGS) ×1 IMPLANT
SUT MNCRL AB 4-0 PS2 18 (SUTURE) ×2 IMPLANT
SUT SILK 2 0 SH (SUTURE) IMPLANT
SUT VIC AB 2-0 SH 27 (SUTURE) ×2
SUT VIC AB 2-0 SH 27XBRD (SUTURE) ×1 IMPLANT
SUT VIC AB 3-0 SH 18 (SUTURE) ×1 IMPLANT
SUT VIC AB 3-0 SH 27 (SUTURE) ×2
SUT VIC AB 3-0 SH 27X BRD (SUTURE) ×1 IMPLANT
SYR BULB 3OZ (MISCELLANEOUS) ×2 IMPLANT
SYR CONTROL 10ML LL (SYRINGE) ×2 IMPLANT
TOWEL OR 17X24 6PK STRL BLUE (TOWEL DISPOSABLE) ×2 IMPLANT
TOWEL OR 17X26 10 PK STRL BLUE (TOWEL DISPOSABLE) ×2 IMPLANT
TUBE CONNECTING 12X1/4 (SUCTIONS) IMPLANT
YANKAUER SUCT BULB TIP NO VENT (SUCTIONS) IMPLANT

## 2016-01-23 NOTE — ED Notes (Signed)
Pt just was discharged from surgery for a "lumpectomy" on left breast. Pt was walking up the street and felt lightheaded, pt vomited. Pt also c/o stomach pains. Pt talking in clear sentences.

## 2016-01-23 NOTE — ED Notes (Signed)
MD at bedside. 

## 2016-01-23 NOTE — Interval H&P Note (Signed)
History and Physical Interval Note:  01/23/2016 7:29 AM  [name]  has presented today for surgery, with the diagnosis of LEFT BREAST PAPILLOMA  The various methods of treatment have been discussed with the patient and family. After consideration of risks, benefits and other options for treatment, the patient has consented to  Procedure(s): BREAST LUMPECTOMY WITH RADIOACTIVE SEED LOCALIZATION (Left) as a surgical intervention .  The patient's history has been reviewed, patient examined, no change in status, stable for surgery.  I have reviewed the patient's chart and labs.  Questions were answered to the patient's satisfaction.     Phenix Vandermeulen Lenna Sciara

## 2016-01-23 NOTE — Progress Notes (Signed)
Patient's friend Claiborne Billings here to take her home.  Patient stated earlier she might like to be admitted to the hospital overnight, however per Dr. Constance Holster insurance likely would not cover the stay.  The patient was given this information and decided she was comfortable with going home.  Pain is under control and friend, Claiborne Billings, states he will stay with her for 24 hours.  Patient also talked with her brother and states her brother will be able to come help her out as well. Discharge instructions reviewed and all belongings returned to patient.

## 2016-01-23 NOTE — Discharge Instructions (Signed)
[place] Office Phone Number [phone]  BREAST BIOPSY/ PARTIAL MASTECTOMY: POST OP INSTRUCTIONS  Always review your discharge instruction sheet given to you by the facility where your surgery was performed.  IF YOU HAVE DISABILITY OR FAMILY LEAVE FORMS, YOU MUST BRING THEM TO THE OFFICE FOR PROCESSING.  DO NOT GIVE THEM TO YOUR DOCTOR.  1. A prescription for pain medication may be given to you upon discharge.  Take your pain medication as prescribed, if needed.  If narcotic pain medicine is not needed, then you may take acetaminophen (Tylenol) or ibuprofen (Advil) as needed. 2. Take your usually prescribed medications unless otherwise directed 3. If you need a refill on your pain medication, please contact your pharmacy.  They will contact our office to request authorization.  Prescriptions will not be filled after 5pm or on week-ends. 4. You should eat very light the first 24 hours after surgery, such as soup, crackers, pudding, etc.  Resume your normal diet the day after surgery. 5. Most patients will experience some swelling and bruising in the breast.  Ice packs and a good support bra will help.  Swelling and bruising can take several days to resolve.  6. It is common to experience some constipation if taking pain medication after surgery.  Increasing fluid intake and taking a stool softener will usually help or prevent this problem from occurring.  A mild laxative (Milk of Magnesia or Miralax) should be taken according to package directions if there are no bowel movements after 48 hours. 7. Unless discharge instructions indicate otherwise, you may remove your bandages 48 hours after surgery, and you may shower at that time.  You may have steri-strips (small skin tapes) in place directly over the incision.  These strips should be left on the skin until they fall off.  If your surgeon used skin glue on the incision, you may shower in 24 hours.  The glue will flake off over the  next 2-3 weeks.  Any sutures or staples will be removed at the office during your follow-up visit. 8. ACTIVITIES:  You may resume regular light daily activities (gradually increasing) beginning the next day. Full activities when you are pain-free. Wearing a good support bra or sports bra minimizes pain and swelling.  You may have sexual intercourse when it is comfortable. a. You may drive when you no longer are taking prescription pain medication, you can comfortably wear a seatbelt, and you can safely maneuver your car and apply brakes. b. RETURN TO WORK:  3-5 days when comfortable.______________________________________________________________________________________ 9. You should see your doctor in the office for a follow-up appointment approximately 2-3 weeks after your surgery. Please call to make this appointment if you do not already have one. Expect your pathology report 3 business days after your surgery.  You may call to check if you do not hear from [place] after three days. 10. OTHER INSTRUCTIONS: _______________________________________________________________________________________________ _____________________________________________________________________________________________________________________________________ _____________________________________________________________________________________________________________________________________ _____________________________________________________________________________________________________________________________________  WHEN TO CALL YOUR DOCTOR: 1. Fever over 101.0 2. Nausea and/or vomiting. 3. Extreme swelling or bruising. 4. Continued bleeding from incision. 5. Increased pain, redness, or drainage from the incision.  The clinic staff is available to answer your questions during regular business hours.  Please dont hesitate to call and ask to speak to one of the nurses for clinical concerns.  If you have a medical emergency,  go to the nearest emergency room or call 911.  A surgeon from [facility] is always on call at the hospital.  For further questions,  please visit [email or web address]

## 2016-01-23 NOTE — Progress Notes (Signed)
Patient complains of uncontrolled pain after multiple measures used.  Discussed with Dr. Zella Richer.  New orders received, will administer additional medication and continue to monitor patient.

## 2016-01-23 NOTE — Anesthesia Preprocedure Evaluation (Signed)
Anesthesia Evaluation  Patient identified by MRN, date of birth, ID band Patient awake    Reviewed: Allergy & Precautions, NPO status , Patient's Chart, lab work & pertinent test results  Airway Mallampati: I  TM Distance: >3 FB Neck ROM: Full    Dental   Pulmonary asthma , former smoker,    Pulmonary exam normal        Cardiovascular hypertension, Pt. on medications Normal cardiovascular exam     Neuro/Psych    GI/Hepatic   Endo/Other    Renal/GU      Musculoskeletal   Abdominal   Peds  Hematology   Anesthesia Other Findings   Reproductive/Obstetrics                             Anesthesia Physical Anesthesia Plan  ASA: II  Anesthesia Plan: General   Post-op Pain Management:    Induction: Intravenous  Airway Management Planned: LMA  Additional Equipment:   Intra-op Plan:   Post-operative Plan: Extubation in OR  Informed Consent: I have reviewed the patients History and Physical, chart, labs and discussed the procedure including the risks, benefits and alternatives for the proposed anesthesia with the patient or authorized representative who has indicated his/her understanding and acceptance.     Plan Discussed with: CRNA and Surgeon  Anesthesia Plan Comments:         Anesthesia Quick Evaluation

## 2016-01-23 NOTE — Transfer of Care (Signed)
Immediate Anesthesia Transfer of Care Note  Patient: [name]  Procedure(s) Performed: Procedure(s): BREAST LUMPECTOMY WITH RADIOACTIVE SEED LOCALIZATION (Left)  Patient Location: PACU  Anesthesia Type:General  Level of Consciousness: awake, alert , oriented and patient cooperative  Airway & Oxygen Therapy: Patient Spontanous Breathing and Patient connected to nasal cannula oxygen  Post-op Assessment: Report given to RN and Post -op Vital signs reviewed and stable  Post vital signs: Reviewed and stable  Last Vitals:  Filed Vitals:   01/23/16 0617 01/23/16 0625  BP: 145/82   Pulse: 72   Temp:  37.4 C  Resp: 18     Last Pain: There were no vitals filed for this visit.       Complications: No apparent anesthesia complications

## 2016-01-23 NOTE — Anesthesia Postprocedure Evaluation (Signed)
Anesthesia Post Note  Patient: [name]  Procedure(s) Performed: Procedure(s) (LRB): BREAST LUMPECTOMY WITH RADIOACTIVE SEED LOCALIZATION (Left)  Patient location during evaluation: PACU Anesthesia Type: General Level of consciousness: awake and alert Pain management: pain level controlled Vital Signs Assessment: post-procedure vital signs reviewed and stable Respiratory status: spontaneous breathing, nonlabored ventilation, respiratory function stable and patient connected to nasal cannula oxygen Cardiovascular status: blood pressure returned to baseline and stable Postop Assessment: no signs of nausea or vomiting Anesthetic complications: no    Last Vitals:  Filed Vitals:   01/23/16 0915 01/23/16 0930  BP: 144/98 128/86  Pulse: 98 88  Temp:    Resp: 16 19    Last Pain:  Filed Vitals:   01/23/16 0936  PainSc: Asleep                 [place] Norberto DAVID

## 2016-01-23 NOTE — ED Provider Notes (Signed)
CSN: [id number]     Arrival date & time 01/23/16  1613 History   First MD Initiated Contact with Patient 01/23/16 1746     Chief Complaint  Patient presents with  . Post-op Problem  . Abdominal Pain     (Consider location/radiation/quality/duration/timing/severity/associated sxs/prior Treatment) HPI   This is a 50 year old female who presents to ED via private vehicle for evaluation of abdominal pain. The patient reports [facility] and was in recovery all day. Per patient they were going to keep her overnight for observation because the patient was feeling nauseated and was still having pain from the procedure but "for some reason" was discharged home. The patient reports she was en route home from the hospital when she had an episode of "coffee-ground" emesis and started having LLQ abdominal pain. She notes she's never had "coffee ground emesis" or blood in emesis before.  The patient reports that she has a diverticulitis episode every year and that its brought on by surgery or stress. Her last episode of diverticulitis was 1 year ago and required hospitalization. She reports her current pain is 8/10, constant but increases in severity occasionally, and is similar to her previous diverticulitis episodes.  She now complains of heartburn, alternating sweats and chills and feels lightheaded. Denies any epigastric pain, chest pain, shortness of breath, diarrhea, constipation or blood in stool.    Past Medical History  Diagnosis Date  . Asthma   . Hypertension   . Anemia   . GERD (gastroesophageal reflux disease)   . Headache(784.0)     "1-2 times/month; comes from stress & allergies"  . Migraine     "maybe twice in the last 10 yrs" (10/16/2013)  . Bursitis of left hip   . History of stomach ulcers   . Shortness of breath dyspnea   . History of hiatal hernia     repaired  . Decreased appetite    Past Surgical History   Procedure Laterality Date  . Tonsillectomy and adenoidectomy  1983  . Cholecystectomy  ~ 2000  . Hernia repair    . Epigastric hernia repair  07/2003  . Bartholin gland cyst excision    . Incision and drainage abscess  10/2005    Bartholin abscess.  . Tubal ligation  1994  . Cesarean section  1989   Family History  Problem Relation Age of Onset  . Cancer Mother     unknown  . Heart attack Father   . Heart attack Sister    Social History  Substance Use Topics  . Smoking status: Former Smoker -- 1 years    Types: Cigarettes  . Smokeless tobacco: Never Used  . Alcohol Use: 0.0 oz/week    0 Standard drinks or equivalent per week     Comment: 10/16/2013 "glass of wine q 6 months or so"   OB History    No data available     Review of Systems  Constitutional: Positive for fever, chills and diaphoresis.  Respiratory: Negative for cough, shortness of breath and wheezing.   Cardiovascular: Negative for chest pain.  Gastrointestinal: Positive for nausea, vomiting and abdominal pain. Negative for diarrhea, constipation, blood in stool and abdominal distention.  Genitourinary: Negative for dysuria, flank pain and difficulty urinating.  Musculoskeletal: Negative for myalgias, arthralgias and neck pain.       Positive for surgical site pain  Skin: Negative for rash.  Neurological: Positive for light-headedness.  Negative for syncope.      Allergies  Amoxicillin; Ampicillin; Mobic; and Montelukast sodium  Home Medications   Prior to Admission medications   Medication Sig Start Date End Date Taking? Authorizing Provider  amLODipine (NORVASC) 10 MG tablet TAKE ONE TABLET BY MOUTH ONCE DAILY 11/10/15  Yes Marjan Rabbani, MD  budesonide-formoterol (SYMBICORT) 80-4.5 MCG/ACT inhaler Inhale 2 puffs into the lungs 2 (two) times daily. 03/12/15  Yes Juluis Mire, MD  cetirizine (ZYRTEC) 10 MG tablet Take 10 mg by mouth daily.   Yes Historical Provider, MD  Cyanocobalamin (VITAMIN B-12 PO)  Take 1 tablet by mouth daily.   Yes Historical Provider, MD  fluticasone (FLONASE) 50 MCG/ACT nasal spray Place 2 sprays into both nostrils daily. 03/07/15  Yes Juluis Mire, MD  hydrochlorothiazide (HYDRODIURIL) 25 MG tablet Take 1 tablet (25 mg total) by mouth daily. 03/07/15  Yes Juluis Mire, MD  magnesium hydroxide (PHILLIPS CHEWS) 311 MG CHEW chewable tablet Chew 311 mg by mouth every 4 (four) hours as needed (supplement).   Yes Historical Provider, MD  HYDROcodone-acetaminophen (NORCO/VICODIN) 5-325 MG tablet Take 1-2 tablets by mouth every 4 (four) hours as needed for moderate pain or severe pain. 01/23/16   Jackolyn Confer, MD   BP 137/100 mmHg  Pulse 114  Temp(Src) 98 F (36.7 C) (Oral)  Resp 20  SpO2 100%  LMP 01/03/2016 Physical Exam  Constitutional: She appears well-developed and well-nourished. She appears distressed.  HENT:  Head: Normocephalic and atraumatic.  Eyes: Pupils are equal, round, and reactive to light.  Cardiovascular: Normal rate and regular rhythm.   Pulmonary/Chest: Effort normal and breath sounds normal. No respiratory distress. She has no wheezes.  Abdominal: Soft. There is tenderness in the left lower quadrant. There is no rigidity, no rebound, no guarding and no CVA tenderness.  Skin: Skin is warm and dry. She is not diaphoretic.  Bandage intact over left breast.     ED Course  Procedures (including critical care time) Labs Review Labs Reviewed  COMPREHENSIVE METABOLIC PANEL - Abnormal; Notable for the following:    Potassium 3.1 (*)    Glucose, Bld 188 (*)    All other components within normal limits  CBC - Abnormal; Notable for the following:    WBC 11.2 (*)    Hemoglobin 11.6 (*)    HCT 35.7 (*)    All other components within normal limits  LIPASE, BLOOD  URINALYSIS, ROUTINE W REFLEX MICROSCOPIC (NOT AT [facility])    Imaging Review Ct Abdomen Pelvis W Contrast  01/23/2016  CLINICAL DATA:  The patient had a left lumpectomy today. She is now  experiencing extreme heartburn and reflux with left lower quadrant pain. EXAM: CT ABDOMEN AND PELVIS WITH CONTRAST TECHNIQUE: Multidetector CT imaging of the abdomen and pelvis was performed using the standard protocol following bolus administration of intravenous contrast. CONTRAST:  140m ISOVUE-300 IOPAMIDOL (ISOVUE-300) INJECTION 61% COMPARISON:  August 04, 2014 CT scan FINDINGS: Mild atelectasis in the medial left lung base. Postoperative changes are seen in the medial left breast, expected in appearance. There is a small hiatal hernia. No other abnormalities seen in the lung bases. No free air. There is a small amount of free fluid in the pelvis and inferior left pericolic gutter. Patient is status post cholecystectomy. The liver and portal vein are normal. The spleen, adrenal glands, and pancreas are normal. Multiple cysts are seen in the right kidney. There is a band of low attenuation along the posterior aspect of the right kidney consistent  with artifact. The left kidney is normal in appearance. The abdominal aorta is normal in caliber. No adenopathy. Other than the hiatal hernia, the stomach is normal in appearance. There is a small duodenal diverticulum off the third portion of the duodenum. This is unchanged. The remainder of the small bowel is normal in appearance. Colonic diverticulosis is identified. No evidence of acute diverticulitis. The acute diverticulitis seen on the previous study has resolved. The remainder of the colon is normal in appearance. The appendix is well seen on coronal images with no appendicitis. There is a fat containing periumbilical hernia. No other acute abnormalities seen in the abdomen. The pelvis does demonstrate a some free fluid. The uterus and adnexae are unremarkable. The bladder is normal. No adenopathy or mass. Visualized bones are unremarkable. Delayed images through the upper abdomen demonstrate no filling defects in the upper renal collecting system. IMPRESSION:  1. There is a small amount of free fluid in the pelvis which is nonspecific. No underlying etiology is identified. This could be physiologic in a patient of this age. 2. No other acute abnormality to explain the patient's symptoms. 3. Post lumpectomy changes in the left breast. Electronically Signed   By: Dorise Bullion III M.D   On: 01/23/2016 21:00   Mm Breast Surgical Specimen  01/23/2016  CLINICAL DATA:  Status post radioactive seed localized excision for papilloma. EXAM: SPECIMEN RADIOGRAPH OF THE LEFT BREAST COMPARISON:  Previous exam(s). FINDINGS: Status post excision of the left breast. The radioactive seed and biopsy marker clip are present, completely intact, and were marked for pathology. IMPRESSION: Specimen radiograph of the left breast. Electronically Signed   By: Nolon Nations M.D.   On: 01/23/2016 08:23   Mm Lt Radioactive Seed Loc Mammo Guide  01/22/2016  CLINICAL DATA:  50 year old female with recently diagnosed left breast papilloma post stereotactic guided biopsy of calcifications in the inner left breast. EXAM: MAMMOGRAPHIC GUIDED RADIOACTIVE SEED LOCALIZATION OF THE LEFT BREAST COMPARISON:  Previous exam(s). FINDINGS: Patient presents for radioactive seed localization prior to left breast excision. I met with the patient and we discussed the procedure of seed localization including benefits and alternatives. We discussed the high likelihood of a successful procedure. We discussed the risks of the procedure including infection, bleeding, tissue injury and further surgery. We discussed the low dose of radioactivity involved in the procedure. Informed, written consent was given. The usual time-out protocol was performed immediately prior to the procedure. Using mammographic guidance, sterile technique, 1% lidocaine and an I-125 radioactive seed, the coil shaped biopsy marking clip in the medial left breast was localized using a medial to lateral approach. The follow-up mammogram images  confirm the seed in the expected location and were marked for Dr. Zella Richer. Follow-up survey of the patient confirms presence of the radioactive seed. Order number of I-125 seed:  [id number]. Total activity:  0.251 mCi  Reference Date: 01/09/2016 The patient tolerated the procedure well and was released from the [facility]. She was given instructions regarding seed removal. IMPRESSION: Radioactive seed localization left breast. No apparent complications. Electronically Signed   By: Everlean Alstrom M.D.   On: 01/22/2016 13:27   I have personally reviewed and evaluated these images and lab results as part of my medical decision-making.   EKG Interpretation None      MDM   Final diagnoses:  None    CBC, CMP, Lipase ordered. Patient has slight leukocytosis at 11.2.  CT abdomen/pelvis w/ contrast ordered to evaluate for diverticulitis.  Zofran  for nausea, Dilaudid for pain and Protonix ordered.   CT abdomen/pelvis without any acute pathology. No indication of diverticulitis. On re-evaluation the patient continues to complain of nausea and pain. She states the dilaudid helped "a little" and reports increased heartburn since taking Protonix. Patient given Zofran, another dose of dilaudid and GI cocktail.   GI cocktail "somewhat" effective for heartburn but patient continues to complains of abdominal pain and nausea. Patients pains still not controled with another dose of Dilaudid. Patient wishes to be admitted for pain management. Consult put in to general surgery. Case discussed with Dr. Grandville Silos of general surgery who accepted admission.  [place], DO 01/23/16 4235   Elnora Morrison, MD 01/25/16 [phone]

## 2016-01-23 NOTE — Progress Notes (Signed)
Per Dr. Zella Richer, patient should be sent to Short Stay for extended recovery.  Called short stay RN who said they did not have space at this time.

## 2016-01-23 NOTE — Anesthesia Procedure Notes (Signed)
Procedure Name: LMA Insertion Date/Time: 01/23/2016 7:40 AM Performed by: Shirlyn Goltz Pre-anesthesia Checklist: Patient identified, Emergency Drugs available, Suction available and Patient being monitored Patient Re-evaluated:Patient Re-evaluated prior to inductionOxygen Delivery Method: Circle system utilized Preoxygenation: Pre-oxygenation with 100% oxygen Intubation Type: IV induction Ventilation: Mask ventilation without difficulty LMA: LMA inserted LMA Size: 4.0 Number of attempts: 1 Placement Confirmation: positive ETCO2 and breath sounds checked- equal and bilateral Tube secured with: Tape Dental Injury: Teeth and Oropharynx as per pre-operative assessment

## 2016-01-23 NOTE — Op Note (Signed)
Operative Note  [name] female 50 y.o. 01/23/2016  PREOPERATIVE DX:  Left breast papilloma  POSTOPERATIVE DX:  Same  PROCEDURE:   Left breast lumpectomy after radioactive seed localization         Surgeon: Odis Hollingshead   Assistants: None  Anesthesia: General LMA anesthesia  Indications:   This is a 50 year old female who on screening mammogram was noted to have an area of microcalcifications inner aspect left breast. 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 Wider excision was recommended and she presents for that. She had successful placement of a radioactive seed 01/22/2016.    Procedure Detail:  She was seen in the holding area. Left breast marked with my initials. Radioactive seed identified in the left breast via neoprobe. She was brought to the operating room placed supine on the operating table and general anesthetic was given. Left breast was sterilely prepped and draped and timeout was performed.  Using a neoprobe, the area of highest counts was noted at the 9:00 position  At the border of the areola and the skin. Local anesthetic was infiltrated from the 6 to 12:00 position and a curvilinear, periareolar incision was made through the skin and dermis. Subcutaneous tissue was partially dissected with electrocautery. Subcutaneous flaps were raised in all directions. Using electrocautery, lumpectomy was performed using the neoprobe to try to centralize the seed in the middle of the specimen. Specimen mammogram demonstrated the seat and clip were contained but were close to the medial margin. More medial tissue was taken.  Both the lumpectomy and medial tissue were marked with ink to identify margins.  There was no residual radioactivity in the lumpectomy bed. The tissue was sent to pathology and the seed recovered in pathology.  The wound was inspected and hemostasis obtained using electrocautery.  Marcaine was injected into the tissue for local anesthetic effect.  The subcutaneous tissues were approximated 2 layers with interrupted 3-0 Vicryl sutures. The skin was closed with a running 4-0 Monocryl subcuticular stitch. Steri-Strips and sterile dressing were applied. A breast binder was applied. Estimated blood loss less than 100 cc.  She tolerated the procedure well without apparent complications. She was taken to recovery in satisfactory condition.         Specimens: left breast tissue        Complications:  * No complications entered in OR log *         Disposition: PACU - hemodynamically stable.         Condition: stable

## 2016-01-23 NOTE — H&P (Signed)
H & P  She was referred by Dr. Rosana Hoes for consultation regarding a left breast papilloma. She underwent a mammogram which demonstrated an area of microcalcifications inner aspect left breast. Stereotactic biopsy was performed demonstrating the results below. Wider excision was recommended and she's been sent here for that. There is no family history of breast cancer. Risk factors were reviewed. She denies any nipple discharge. She denies breast masses.   Breast, left, needle core biopsy, lower inner - FIBROCYSTIC CHANGES WITH USUAL DUCTAL HYPERPLASIA, SMALL DUCTAL PAPILLOMA AND CALCIFICATIONS. - NO MALIGNANCY IDENTIFIED.  Other Problems  Arthritis Asthma Diverticulosis Gastroesophageal Reflux Disease High blood pressure Umbilical Hernia Repair  Past Surgical History  Breast Biopsy Left. Cesarean Section - 1 Gallbladder Surgery - Laparoscopic Tonsillectomy  Diagnostic Studies History  Colonoscopy never Mammogram within last year Pap Smear 1-5 years ago  Allergies  No Known Drug Allergies 11/26/2015  Prior to Admission medications   Medication Sig Start Date End Date Taking? Authorizing Provider  amLODipine (NORVASC) 10 MG tablet TAKE ONE TABLET BY MOUTH ONCE DAILY 11/10/15  Yes Marjan Rabbani, MD  budesonide-formoterol (SYMBICORT) 80-4.5 MCG/ACT inhaler Inhale 2 puffs into the lungs 2 (two) times daily. 03/12/15  Yes Juluis Mire, MD  cetirizine (ZYRTEC) 10 MG tablet Take 10 mg by mouth daily.   Yes Historical Provider, MD  Cyanocobalamin (VITAMIN B-12 PO) Take 1 tablet by mouth daily.   Yes Historical Provider, MD  fluticasone (FLONASE) 50 MCG/ACT nasal spray Place 2 sprays into both nostrils daily. 03/07/15  Yes Juluis Mire, MD  hydrochlorothiazide (HYDRODIURIL) 25 MG tablet Take 1 tablet (25 mg total) by mouth daily. 03/07/15  Yes Juluis Mire, MD  magnesium hydroxide (PHILLIPS CHEWS) 311 MG CHEW chewable tablet Chew 311 mg by mouth every 4 (four) hours as  needed (supplement).   Yes Historical Provider, MD  albuterol (PROVENTIL HFA;VENTOLIN HFA) 108 (90 BASE) MCG/ACT inhaler Inhale 2 puffs into the lungs every 6 (six) hours as needed for wheezing. Patient not taking: Reported on 01/13/2016 03/07/15   Juluis Mire, MD  ferrous sulfate 325 (65 FE) MG tablet Take 1 tablet (325 mg total) by mouth daily. Patient not taking: Reported on 01/13/2016 03/07/15 03/06/16  Juluis Mire, MD     Social History  Alcohol use Occasional alcohol use. Caffeine use Coffee. No drug use Tobacco use Never smoker.  Family History Arthritis Mother. Cancer Mother. Diabetes Mellitus Mother. Heart Disease Father. Hypertension Mother, Sister. Seizure disorder Son.  Pregnancy / Birth History Age at menarche 34 years. Gravida 3 Maternal age 79-20 Para 2 Regular periods     Review of Systems  General Not Present- Appetite Loss, Chills, Fatigue, Fever, Night Sweats, Weight Gain and Weight Loss. Skin Not Present- Change in Wart/Mole, Dryness, Hives, Jaundice, New Lesions, Non-Healing Wounds, Rash and Ulcer. HEENT Present- Seasonal Allergies and Sinus Pain. Not Present- Earache, Hearing Loss, Hoarseness, Nose Bleed, Oral Ulcers, Ringing in the Ears, Sore Throat, Visual Disturbances, Wears glasses/contact lenses and Yellow Eyes. Respiratory Not Present- Bloody sputum, Chronic Cough, Difficulty Breathing, Snoring and Wheezing. Breast Not Present- Breast Mass, Breast Pain, Nipple Discharge and Skin Changes. Cardiovascular Not Present- Chest Pain, Difficulty Breathing Lying Down, Leg Cramps, Palpitations, Rapid Heart Rate, Shortness of Breath and Swelling of Extremities. Gastrointestinal Not Present- Abdominal Pain, Bloating, Bloody Stool, Change in Bowel Habits, Chronic diarrhea, Constipation, Difficulty Swallowing, Excessive gas, Gets full quickly at meals, Hemorrhoids, Indigestion, Nausea, Rectal Pain and Vomiting. Female Genitourinary Not Present-  Frequency, Nocturia, Painful Urination, Pelvic Pain and Urgency.  Musculoskeletal Not Present- Back Pain, Joint Pain, Joint Stiffness, Muscle Pain, Muscle Weakness and Swelling of Extremities. Neurological Not Present- Decreased Memory, Fainting, Headaches, Numbness, Seizures, Tingling, Tremor, Trouble walking and Weakness. Psychiatric Not Present- Anxiety, Bipolar, Change in Sleep Pattern, Depression, Fearful and Frequent crying. Endocrine Not Present- Cold Intolerance, Excessive Hunger, Hair Changes, Heat Intolerance, Hot flashes and New Diabetes. Hematology Not Present- Easy Bruising, Excessive bleeding, Gland problems, HIV and Persistent Infections.  Physical Exam   The physical exam findings are as follows: Note:General: Overweight female in NAD. Pleasant and cooperative.  HEENT: [place]/AT, no facial masses  EYES: EOMI, no icterus  NECK: Supple, no obvious mass  CV: RRR, no murmur, no JVD.  CHEST: Breath sounds equal and clear. Respirations nonlabored.  BREASTS: Symmetrical in size. No dominant masses, nipple discharge or suspicious skin lesions. Small puncture wound medial aspect left breast  LYMPHATIC: No palpable cervical, supraclavicular, axillary adenopathy.  NEUROLOGIC: Alert and oriented, answers questions appropriately, normal gait and station.  PSYCHIATRIC: Normal mood, affect , and behavior.    Assessment & Plan   PAPILLOMA OF LEFT BREAST (D24.2) Impression: Wider excision of the area has been recommended and she is in agreement with this. We went over the reasons for this.  Plan: Left breast lumpectomy after radioactive seed localization. I have explained the procedure, risks, and aftercare to her. Risks include but are not limited to bleeding, infection, wound problems, cosmetic deformity, anesthesia. She seems to [facility] and agrees with the plan.  Jackolyn Confer, MD

## 2016-01-23 NOTE — ED Notes (Signed)
Patient transported to CT 

## 2016-01-23 NOTE — H&P (Signed)
[name] is an 49 y.o. female.   Chief Complaint: Nausea and vomiting, postoperative pain HPI: [name] is status post left radioactive seed guided lumpectomy by Dr. Jackolyn Confer this morning. Postoperatively, she had a lot of pain and nausea and was observed for an extended period of time until around 3 this afternoon. She was discharged at that time but shortly thereafter developed nausea vomiting, abdominal pain, surgical site pain, and return to the emergency department. Workup has been unremarkable including a CT scan of the abdomen and pelvis. She continues to have nausea and pain and I was asked to see her for admission.  Past Medical History  Diagnosis Date  . Asthma   . Hypertension   . Anemia   . GERD (gastroesophageal reflux disease)   . Headache(784.0)     "1-2 times/month; comes from stress & allergies"  . Migraine     "maybe twice in the last 10 yrs" (10/16/2013)  . Bursitis of left hip   . History of stomach ulcers   . Shortness of breath dyspnea   . History of hiatal hernia     repaired  . Decreased appetite     Past Surgical History  Procedure Laterality Date  . Tonsillectomy and adenoidectomy  1983  . Cholecystectomy  ~ 2000  . Hernia repair    . Epigastric hernia repair  07/2003  . Bartholin gland cyst excision    . Incision and drainage abscess  10/2005    Bartholin abscess.  . Tubal ligation  1994  . Cesarean section  1989    Family History  Problem Relation Age of Onset  . Cancer Mother     unknown  . Heart attack Father   . Heart attack Sister    Social History:  reports that she has quit smoking. Her smoking use included Cigarettes. She quit after 1 year of use. She has never used smokeless tobacco. She reports that she drinks alcohol. She reports that she does not use illicit drugs.  Allergies:  Allergies  Allergen Reactions  . Amoxicillin Rash  . Ampicillin Rash  . Mobic [Meloxicam] Rash  . Montelukast Sodium Rash     (Not in a  hospital admission)  Results for orders placed or performed during the hospital encounter of 01/23/16 (from the past 48 hour(s))  Lipase, blood     Status: None   Collection Time: 01/23/16  4:33 PM  Result Value Ref Range   Lipase 14 11 - 51 U/L  Comprehensive metabolic panel     Status: Abnormal   Collection Time: 01/23/16  4:33 PM  Result Value Ref Range   Sodium 137 135 - 145 mmol/L   Potassium 3.1 (L) 3.5 - 5.1 mmol/L   Chloride 101 101 - 111 mmol/L   CO2 27 22 - 32 mmol/L   Glucose, Bld 188 (H) 65 - 99 mg/dL   BUN 8 6 - 20 mg/dL   Creatinine, Ser 0.70 0.44 - 1.00 mg/dL   Calcium 9.9 8.9 - 10.3 mg/dL   Total Protein 7.2 6.5 - 8.1 g/dL   Albumin 3.9 3.5 - 5.0 g/dL   AST 17 15 - 41 U/L   ALT 14 14 - 54 U/L   Alkaline Phosphatase 50 38 - 126 U/L   Total Bilirubin 0.5 0.3 - 1.2 mg/dL   GFR calc non Af Amer >60 >60 mL/min   GFR calc Af Amer >60 >60 mL/min    Comment: (NOTE) The eGFR has been calculated  using the CKD EPI equation. This calculation has not been validated in all clinical situations. eGFR's persistently <60 mL/min signify possible Chronic Kidney Disease.    Anion gap 9 5 - 15  CBC     Status: Abnormal   Collection Time: 01/23/16  4:33 PM  Result Value Ref Range   WBC 11.2 (H) 4.0 - 10.5 K/uL   RBC 4.37 3.87 - 5.11 MIL/uL   Hemoglobin 11.6 (L) 12.0 - 15.0 g/dL   HCT 35.7 (L) 36.0 - 46.0 %   MCV 81.7 78.0 - 100.0 fL   MCH 26.5 26.0 - 34.0 pg   MCHC 32.5 30.0 - 36.0 g/dL   RDW 13.8 11.5 - 15.5 %   Platelets 298 150 - 400 K/uL  Urinalysis, Routine w reflex microscopic     Status: Abnormal   Collection Time: 01/23/16  7:10 PM  Result Value Ref Range   Color, Urine YELLOW YELLOW   APPearance HAZY (A) CLEAR   Specific Gravity, Urine 1.016 1.005 - 1.030   pH 7.5 5.0 - 8.0   Glucose, UA 100 (A) NEGATIVE mg/dL   Hgb urine dipstick LARGE (A) NEGATIVE   Bilirubin Urine NEGATIVE NEGATIVE   Ketones, ur 15 (A) NEGATIVE mg/dL   Protein, ur 30 (A) NEGATIVE mg/dL    Nitrite NEGATIVE NEGATIVE   Leukocytes, UA NEGATIVE NEGATIVE  Urine microscopic-add on     Status: Abnormal   Collection Time: 01/23/16  7:10 PM  Result Value Ref Range   Squamous Epithelial / LPF 6-30 (A) NONE SEEN   WBC, UA 0-5 0 - 5 WBC/hpf   RBC / HPF 6-30 0 - 5 RBC/hpf   Bacteria, UA RARE (A) NONE SEEN   Urine-Other MUCOUS PRESENT   POC urine preg, ED (not at [facility])     Status: None   Collection Time: 01/23/16  7:37 PM  Result Value Ref Range   Preg Test, Ur NEGATIVE NEGATIVE    Comment:        THE SENSITIVITY OF THIS METHODOLOGY IS >24 mIU/mL    Ct Abdomen Pelvis W Contrast  01/23/2016  CLINICAL DATA:  The patient had a left lumpectomy today. She is now experiencing extreme heartburn and reflux with left lower quadrant pain. EXAM: CT ABDOMEN AND PELVIS WITH CONTRAST TECHNIQUE: Multidetector CT imaging of the abdomen and pelvis was performed using the standard protocol following bolus administration of intravenous contrast. CONTRAST:  131m ISOVUE-300 IOPAMIDOL (ISOVUE-300) INJECTION 61% COMPARISON:  August 04, 2014 CT scan FINDINGS: Mild atelectasis in the medial left lung base. Postoperative changes are seen in the medial left breast, expected in appearance. There is a small hiatal hernia. No other abnormalities seen in the lung bases. No free air. There is a small amount of free fluid in the pelvis and inferior left pericolic gutter. Patient is status post cholecystectomy. The liver and portal vein are normal. The spleen, adrenal glands, and pancreas are normal. Multiple cysts are seen in the right kidney. There is a band of low attenuation along the posterior aspect of the right kidney consistent with artifact. The left kidney is normal in appearance. The abdominal aorta is normal in caliber. No adenopathy. Other than the hiatal hernia, the stomach is normal in appearance. There is a small duodenal diverticulum off the third portion of the duodenum. This is unchanged. The remainder of the  small bowel is normal in appearance. Colonic diverticulosis is identified. No evidence of acute diverticulitis. The acute diverticulitis seen on the previous study has  resolved. The remainder of the colon is normal in appearance. The appendix is well seen on coronal images with no appendicitis. There is a fat containing periumbilical hernia. No other acute abnormalities seen in the abdomen. The pelvis does demonstrate a some free fluid. The uterus and adnexae are unremarkable. The bladder is normal. No adenopathy or mass. Visualized bones are unremarkable. Delayed images through the upper abdomen demonstrate no filling defects in the upper renal collecting system. IMPRESSION: 1. There is a small amount of free fluid in the pelvis which is nonspecific. No underlying etiology is identified. This could be physiologic in a patient of this age. 2. No other acute abnormality to explain the patient's symptoms. 3. Post lumpectomy changes in the left breast. Electronically Signed   By: Dorise Bullion III M.D   On: 01/23/2016 21:00   Mm Breast Surgical Specimen  01/23/2016  CLINICAL DATA:  Status post radioactive seed localized excision for papilloma. EXAM: SPECIMEN RADIOGRAPH OF THE LEFT BREAST COMPARISON:  Previous exam(s). FINDINGS: Status post excision of the left breast. The radioactive seed and biopsy marker clip are present, completely intact, and were marked for pathology. IMPRESSION: Specimen radiograph of the left breast. Electronically Signed   By: Nolon Nations M.D.   On: 01/23/2016 08:23   Mm Lt Radioactive Seed Loc Mammo Guide  01/22/2016  CLINICAL DATA:  50 year old female with recently diagnosed left breast papilloma post stereotactic guided biopsy of calcifications in the inner left breast. EXAM: MAMMOGRAPHIC GUIDED RADIOACTIVE SEED LOCALIZATION OF THE LEFT BREAST COMPARISON:  Previous exam(s). FINDINGS: Patient presents for radioactive seed localization prior to left breast excision. I met with the  patient and we discussed the procedure of seed localization including benefits and alternatives. We discussed the high likelihood of a successful procedure. We discussed the risks of the procedure including infection, bleeding, tissue injury and further surgery. We discussed the low dose of radioactivity involved in the procedure. Informed, written consent was given. The usual time-out protocol was performed immediately prior to the procedure. Using mammographic guidance, sterile technique, 1% lidocaine and an I-125 radioactive seed, the coil shaped biopsy marking clip in the medial left breast was localized using a medial to lateral approach. The follow-up mammogram images confirm the seed in the expected location and were marked for Dr. Zella Richer. Follow-up survey of the patient confirms presence of the radioactive seed. Order number of I-125 seed:  [id number]. Total activity:  0.251 mCi  Reference Date: 01/09/2016 The patient tolerated the procedure well and was released from the [place]. She was given instructions regarding seed removal. IMPRESSION: Radioactive seed localization left breast. No apparent complications. Electronically Signed   By: Everlean Alstrom M.D.   On: 01/22/2016 13:27    Review of Systems  Constitutional: Negative for fever.  HENT: Negative.   Eyes: Negative.   Respiratory: Positive for wheezing.   Cardiovascular: Negative for chest pain.  Gastrointestinal: Positive for nausea, vomiting and abdominal pain.  Genitourinary: Negative.   Musculoskeletal:       Postoperative pain left breast  Skin: Negative.   Neurological: Negative.   Endo/Heme/Allergies: Negative.   Psychiatric/Behavioral: Negative.     Blood pressure 151/96, pulse 83, temperature 98 F (36.7 C), temperature source Oral, resp. rate 20, last menstrual period 01/03/2016, SpO2 97 %. Physical Exam  Constitutional: She is oriented to person, place, and time. She appears well-developed and well-nourished.   HENT:  Head: Normocephalic.  Neck: Neck supple. No tracheal deviation present.  Cardiovascular: Normal rate, regular rhythm  and normal heart sounds.   Respiratory: Effort normal. No respiratory distress. She has wheezes. She has no rales.    Very mild wheeze bilaterally, left breast dressing clean dry and intact, no hematoma, no evidence of infection  GI: Soft. She exhibits no distension. There is no tenderness. There is no rebound and no guarding.  Neurological: She is alert and oriented to person, place, and time.  Skin: Skin is warm.  Psychiatric:  Pressured speech     Assessment/Plan Status post left radioactive seed guided lumpectomy now with postoperative pain, nausea, and vomiting. Will admit for antiemetics and pain control.  Zenovia Jarred, MD 01/23/2016, 11:32 PM

## 2016-01-24 MED ORDER — IPRATROPIUM-ALBUTEROL 0.5-2.5 (3) MG/3ML IN SOLN: 3.0000 mL | Freq: Four times a day (QID) | RESPIRATORY_TRACT | Status: DC | PRN

## 2016-01-24 MED ORDER — HYDROCODONE-ACETAMINOPHEN 5-325 MG PO TABS
1.0000 | ORAL_TABLET | ORAL | Status: DC | PRN
  Administered 2016-01-24 (×2): 2 via ORAL
  Filled 2016-01-24 (×2): qty 2

## 2016-01-24 MED ORDER — ALUM & MAG HYDROXIDE-SIMETH 200-200-20 MG/5ML PO SUSP: 30.0000 mL | ORAL | Status: DC | PRN

## 2016-01-24 MED ORDER — METHOCARBAMOL 500 MG PO TABS: 500.0000 mg | ORAL_TABLET | Freq: Four times a day (QID) | ORAL | Status: DC | PRN

## 2016-01-24 MED ORDER — FLUTICASONE PROPIONATE 50 MCG/ACT NA SUSP
2.0000 | Freq: Every day | NASAL | Status: DC
  Filled 2016-01-24: qty 16

## 2016-01-24 MED ORDER — MORPHINE SULFATE (PF) 4 MG/ML IV SOLN: 4.0000 mg | INTRAVENOUS | Status: DC | PRN

## 2016-01-24 MED ORDER — AMLODIPINE BESYLATE 10 MG PO TABS
10.0000 mg | ORAL_TABLET | Freq: Every day | ORAL | Status: DC
  Administered 2016-01-24: 10 mg via ORAL
  Filled 2016-01-24 (×2): qty 1

## 2016-01-24 MED ORDER — ONDANSETRON HCL 4 MG/2ML IJ SOLN: 4.0000 mg | Freq: Four times a day (QID) | INTRAMUSCULAR | Status: DC | PRN

## 2016-01-24 MED ORDER — HYDROCHLOROTHIAZIDE 25 MG PO TABS
25.0000 mg | ORAL_TABLET | Freq: Every day | ORAL | Status: DC
  Administered 2016-01-24: 25 mg via ORAL
  Filled 2016-01-24: qty 1

## 2016-01-24 NOTE — Progress Notes (Signed)
Patient discharged to home. VSS. AVS given to patient and agrees and verbalizes understanding. Patient d/c'd to home. No more further questions at this moment.  Vergia Alberts n 01/24/2016 10:47 AM

## 2016-01-24 NOTE — Discharge Summary (Signed)
Physician Discharge Summary  Patient ID:  [name]  MRN: [id number]  DOB/AGE: 1966-06-01 50 y.o.  Admit date: 01/23/2016 Discharge date: 01/24/2016  Discharge Diagnoses:   Active Problems:   Nausea & vomiting  . Asthma   . Hypertension   . Anemia   . GERD (gastroesophageal reflux disease)   . Headache(784.0)     "1-2 times/month; comes from stress & allergies"  . Migraine     "maybe twice in the last 10 yrs" (10/16/2013)  . Bursitis of left hip   . History of stomach ulcers   . Shortness of breath dyspnea   . History of hiatal hernia     repaired       Operation:  Left breast biopsy - 01/23/2016 - T. [facility]  Discharged Condition: good  Hospital Course: [name] is an 50 y.o. female whose primary care physician is Nyra Market, MD and who was admitted 01/23/2016 with a chief complaint of  Chief Complaint  Patient presents with  . Post-op Problem  . Abdominal Pain   Ms. Keatley underwent a left breast biopsy on 01/23/2016 by Dr. Abbey Chatters.  She went home yesterday, developed left lower quadrant abdominal pain, vomited, came back to the ER.  The CT scan of the abdomen was negative.  She is doing better this AM.  She will go home.  She'll have some issues since her car is here, but she has a friend who can pick her up. She already has discharge instructions from Dr. Abbey Chatters from yesterday.  The discharge instructions were reviewed with the patient.  Consults: None  Significant Diagnostic Studies: Results for orders placed or performed during the hospital encounter of 01/23/16  Lipase, blood  Result Value Ref Range   Lipase 14 11 - 51 U/L  Comprehensive metabolic panel  Result Value Ref Range   Sodium 137 135 - 145 mmol/L   Potassium 3.1 (L) 3.5 - 5.1 mmol/L   Chloride 101 101 - 111 mmol/L   CO2 27 22 - 32 mmol/L   Glucose, Bld 188 (H) 65 - 99 mg/dL   BUN 8 6 - 20 mg/dL   Creatinine, Ser 1.47 0.44 - 1.00  mg/dL   Calcium 9.9 8.9 - 82.9 mg/dL   Total Protein 7.2 6.5 - 8.1 g/dL   Albumin 3.9 3.5 - 5.0 g/dL   AST 17 15 - 41 U/L   ALT 14 14 - 54 U/L   Alkaline Phosphatase 50 38 - 126 U/L   Total Bilirubin 0.5 0.3 - 1.2 mg/dL   GFR calc non Af Amer >60 >60 mL/min   GFR calc Af Amer >60 >60 mL/min   Anion gap 9 5 - 15  CBC  Result Value Ref Range   WBC 11.2 (H) 4.0 - 10.5 K/uL   RBC 4.37 3.87 - 5.11 MIL/uL   Hemoglobin 11.6 (L) 12.0 - 15.0 g/dL   HCT 56.2 (L) 13.0 - 86.5 %   MCV 81.7 78.0 - 100.0 fL   MCH 26.5 26.0 - 34.0 pg   MCHC 32.5 30.0 - 36.0 g/dL   RDW 78.4 69.6 - 29.5 %   Platelets 298 150 - 400 K/uL  Urinalysis, Routine w reflex microscopic  Result Value Ref Range   Color, Urine YELLOW YELLOW   APPearance HAZY (A) CLEAR   Specific Gravity, Urine 1.016 1.005 - 1.030   pH 7.5 5.0 - 8.0   Glucose, UA 100 (A) NEGATIVE mg/dL   Hgb urine dipstick LARGE (A) NEGATIVE  Bilirubin Urine NEGATIVE NEGATIVE   Ketones, ur 15 (A) NEGATIVE mg/dL   Protein, ur 30 (A) NEGATIVE mg/dL   Nitrite NEGATIVE NEGATIVE   Leukocytes, UA NEGATIVE NEGATIVE  Urine microscopic-add on  Result Value Ref Range   Squamous Epithelial / LPF 6-30 (A) NONE SEEN   WBC, UA 0-5 0 - 5 WBC/hpf   RBC / HPF 6-30 0 - 5 RBC/hpf   Bacteria, UA RARE (A) NONE SEEN   Urine-Other MUCOUS PRESENT   POC urine preg, ED (not at [facility])  Result Value Ref Range   Preg Test, Ur NEGATIVE NEGATIVE    Ct Abdomen Pelvis W Contrast  01/23/2016  CLINICAL DATA:  The patient had a left lumpectomy today. She is now experiencing extreme heartburn and reflux with left lower quadrant pain. EXAM: CT ABDOMEN AND PELVIS WITH CONTRAST TECHNIQUE: Multidetector CT imaging of the abdomen and pelvis was performed using the standard protocol following bolus administration of intravenous contrast. CONTRAST:  166m ISOVUE-300 IOPAMIDOL (ISOVUE-300) INJECTION 61% COMPARISON:  August 04, 2014 CT scan FINDINGS: Mild atelectasis in the medial left lung  base. Postoperative changes are seen in the medial left breast, expected in appearance. There is a small hiatal hernia. No other abnormalities seen in the lung bases. No free air. There is a small amount of free fluid in the pelvis and inferior left pericolic gutter. Patient is status post cholecystectomy. The liver and portal vein are normal. The spleen, adrenal glands, and pancreas are normal. Multiple cysts are seen in the right kidney. There is a band of low attenuation along the posterior aspect of the right kidney consistent with artifact. The left kidney is normal in appearance. The abdominal aorta is normal in caliber. No adenopathy. Other than the hiatal hernia, the stomach is normal in appearance. There is a small duodenal diverticulum off the third portion of the duodenum. This is unchanged. The remainder of the small bowel is normal in appearance. Colonic diverticulosis is identified. No evidence of acute diverticulitis. The acute diverticulitis seen on the previous study has resolved. The remainder of the colon is normal in appearance. The appendix is well seen on coronal images with no appendicitis. There is a fat containing periumbilical hernia. No other acute abnormalities seen in the abdomen. The pelvis does demonstrate a some free fluid. The uterus and adnexae are unremarkable. The bladder is normal. No adenopathy or mass. Visualized bones are unremarkable. Delayed images through the upper abdomen demonstrate no filling defects in the upper renal collecting system. IMPRESSION: 1. There is a small amount of free fluid in the pelvis which is nonspecific. No underlying etiology is identified. This could be physiologic in a patient of this age. 2. No other acute abnormality to explain the patient's symptoms. 3. Post lumpectomy changes in the left breast. Electronically Signed   By: DDorise BullionIII M.D   On: 01/23/2016 21:00   Mm Breast Surgical Specimen  01/23/2016  CLINICAL DATA:  Status post  radioactive seed localized excision for papilloma. EXAM: SPECIMEN RADIOGRAPH OF THE LEFT BREAST COMPARISON:  Previous exam(s). FINDINGS: Status post excision of the left breast. The radioactive seed and biopsy marker clip are present, completely intact, and were marked for pathology. IMPRESSION: Specimen radiograph of the left breast. Electronically Signed   By: ENolon NationsM.D.   On: 01/23/2016 08:23   Mm Lt Radioactive Seed Loc Mammo Guide  01/22/2016  CLINICAL DATA:  50year old female with recently diagnosed left breast papilloma post stereotactic guided biopsy of calcifications  in the inner left breast. EXAM: MAMMOGRAPHIC GUIDED RADIOACTIVE SEED LOCALIZATION OF THE LEFT BREAST COMPARISON:  Previous exam(s). FINDINGS: Patient presents for radioactive seed localization prior to left breast excision. I met with the patient and we discussed the procedure of seed localization including benefits and alternatives. We discussed the high likelihood of a successful procedure. We discussed the risks of the procedure including infection, bleeding, tissue injury and further surgery. We discussed the low dose of radioactivity involved in the procedure. Informed, written consent was given. The usual time-out protocol was performed immediately prior to the procedure. Using mammographic guidance, sterile technique, 1% lidocaine and an I-125 radioactive seed, the coil shaped biopsy marking clip in the medial left breast was localized using a medial to lateral approach. The follow-up mammogram images confirm the seed in the expected location and were marked for Dr. Zella Richer. Follow-up survey of the patient confirms presence of the radioactive seed. Order number of I-125 seed:  [id number]. Total activity:  0.251 mCi  Reference Date: 01/09/2016 The patient tolerated the procedure well and was released from the [facility]. She was given instructions regarding seed removal. IMPRESSION: Radioactive seed localization left  breast. No apparent complications. Electronically Signed   By: Everlean Alstrom M.D.   On: 01/22/2016 13:27    Discharge Exam:  Filed Vitals:   01/24/16 0053 01/24/16 0508  BP: 128/84 148/75  Pulse: 72 74  Temp: 98.2 F (36.8 C) 98.5 F (36.9 C)  Resp: 18 18    General: WN AA F who is alert.  Lungs: Clear to auscultation and symmetric breath sounds. Heart:  RRR. No murmur or rub. Breasts:  Left - has bandage on left breast which is clean.  I did not remove.  She is in a breast binder. Abdomen: Soft. No mass. No tenderness. No hernia. Normal bowel sounds.   Discharge Medications:     Medication List    TAKE these medications        amLODipine 10 MG tablet  Commonly known as:  NORVASC  TAKE ONE TABLET BY MOUTH ONCE DAILY     budesonide-formoterol 80-4.5 MCG/ACT inhaler  Commonly known as:  SYMBICORT  Inhale 2 puffs into the lungs 2 (two) times daily.     cetirizine 10 MG tablet  Commonly known as:  ZYRTEC  Take 10 mg by mouth daily.     fluticasone 50 MCG/ACT nasal spray  Commonly known as:  FLONASE  Place 2 sprays into both nostrils daily.     hydrochlorothiazide 25 MG tablet  Commonly known as:  HYDRODIURIL  Take 1 tablet (25 mg total) by mouth daily.     HYDROcodone-acetaminophen 5-325 MG tablet  Commonly known as:  NORCO/VICODIN  Take 1-2 tablets by mouth every 4 (four) hours as needed for moderate pain or severe pain.     magnesium hydroxide 311 MG Chew chewable tablet  Commonly known as:  PHILLIPS CHEWS  Chew 311 mg by mouth every 4 (four) hours as needed (supplement).     VITAMIN B-12 PO  Take 1 tablet by mouth daily.        Disposition: 01-Home or Self Care      Discharge Instructions    Diet - low sodium heart healthy    Complete by:  As directed      Increase activity slowly    Complete by:  As directed             Signed: Alphonsa Overall, M.D., [facility] Surgery Office:  [phone]  01/24/2016, 8:36 AM

## 2016-01-24 NOTE — Discharge Instructions (Signed)
Already has discharge instructions from Dr. Zella Richer.  Follow up with Dr. Zella Richer.

## 2016-01-25 ENCOUNTER — Encounter (HOSPITAL_COMMUNITY): Payer: Self-pay | Admitting: General Surgery

## 2016-03-18 ENCOUNTER — Other Ambulatory Visit: Payer: Self-pay | Admitting: Internal Medicine

## 2016-03-18 ENCOUNTER — Encounter (HOSPITAL_COMMUNITY): Payer: Self-pay

## 2016-03-18 DIAGNOSIS — J309 Allergic rhinitis, unspecified: Secondary | ICD-10-CM

## 2016-03-18 DIAGNOSIS — J4521 Mild intermittent asthma with (acute) exacerbation: Secondary | ICD-10-CM

## 2016-03-18 DIAGNOSIS — J4540 Moderate persistent asthma, uncomplicated: Secondary | ICD-10-CM

## 2016-06-15 ENCOUNTER — Emergency Department (HOSPITAL_COMMUNITY)
Admission: EM | Admit: 2016-06-15 | Discharge: 2016-06-15 | Disposition: A | Discharge status: Home / Self Care | Payer: BLUE CROSS/BLUE SHIELD | Attending: Emergency Medicine | Admitting: Emergency Medicine

## 2016-06-15 ENCOUNTER — Encounter (HOSPITAL_COMMUNITY): Payer: Self-pay | Admitting: *Deleted

## 2016-06-15 DIAGNOSIS — I10 Essential (primary) hypertension: Secondary | ICD-10-CM | POA: Insufficient documentation

## 2016-06-15 DIAGNOSIS — Y939 Activity, unspecified: Secondary | ICD-10-CM | POA: Diagnosis not present

## 2016-06-15 DIAGNOSIS — S76112A Strain of left quadriceps muscle, fascia and tendon, initial encounter: Secondary | ICD-10-CM | POA: Diagnosis not present

## 2016-06-15 DIAGNOSIS — Y92129 Unspecified place in nursing home as the place of occurrence of the external cause: Secondary | ICD-10-CM | POA: Diagnosis not present

## 2016-06-15 DIAGNOSIS — X509XXA Other and unspecified overexertion or strenuous movements or postures, initial encounter: Secondary | ICD-10-CM | POA: Insufficient documentation

## 2016-06-15 DIAGNOSIS — Y9289 Other specified places as the place of occurrence of the external cause: Secondary | ICD-10-CM | POA: Insufficient documentation

## 2016-06-15 DIAGNOSIS — Y990 Civilian activity done for income or pay: Secondary | ICD-10-CM | POA: Insufficient documentation

## 2016-06-15 DIAGNOSIS — Z87891 Personal history of nicotine dependence: Secondary | ICD-10-CM | POA: Insufficient documentation

## 2016-06-15 DIAGNOSIS — J45909 Unspecified asthma, uncomplicated: Secondary | ICD-10-CM | POA: Insufficient documentation

## 2016-06-15 DIAGNOSIS — X501XXA Overexertion from prolonged static or awkward postures, initial encounter: Secondary | ICD-10-CM | POA: Diagnosis not present

## 2016-06-15 DIAGNOSIS — Y9389 Activity, other specified: Secondary | ICD-10-CM | POA: Diagnosis not present

## 2016-06-15 DIAGNOSIS — S79922A Unspecified injury of left thigh, initial encounter: Secondary | ICD-10-CM | POA: Diagnosis not present

## 2016-06-15 MED ORDER — METHOCARBAMOL 500 MG PO TABS
500.0000 mg | ORAL_TABLET | Freq: Two times a day (BID) | ORAL | 0 refills | Status: DC
Start: 2016-06-15 — End: 2017-05-30

## 2016-06-15 MED ORDER — METHOCARBAMOL 500 MG PO TABS
500.0000 mg | ORAL_TABLET | Freq: Once | ORAL | Status: AC
  Administered 2016-06-15: 500 mg via ORAL
  Filled 2016-06-15: qty 1

## 2016-06-15 MED ORDER — IBUPROFEN 600 MG PO TABS: 600.0000 mg | ORAL_TABLET | Freq: Four times a day (QID) | ORAL | 0 refills | Status: DC | PRN

## 2016-06-15 NOTE — ED Provider Notes (Signed)
[facility] DEPT Provider Note   CSN: [id number] Arrival date & time: 06/15/16  [id number]  By signing my name below, I, Rayna Sexton, attest that this documentation has been prepared under the direction and in the presence of Shary Decamp, PA-C. Electronically Signed: Rayna Sexton, ED Scribe. 06/15/16. 9:53 AM.   History   Chief Complaint Chief Complaint  Patient presents with  . Leg Pain    HPI HPI Comments: [name] is a 50 y.o. female who presents to the Emergency Department complaining of worsening, moderate, atraumatic, cramping/burning, bilateral thigh pain x 3 days. 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 Her pain presents intermittently and has been present mainly in her left thigh the past 2 days. She has taken 800 mg ibuprofen prn which provides mild short term relief. Her pain worsens with movement and ambulation. She reports associated difficulty sleeping due to her pain. No numbness, tingling, back pain or other associated symptoms at this time.   The history is provided by the patient and medical records. No language interpreter was used.    Past Medical History:  Diagnosis Date  . Anemia   . Asthma   . Bursitis of left hip   . Decreased appetite   . GERD (gastroesophageal reflux disease)   . Headache(784.0)    "1-2 times/month; comes from stress & allergies"  . History of hiatal hernia    repaired  . History of stomach ulcers   . Hypertension   . Migraine    "maybe twice in the last 10 yrs" (10/16/2013)  . Shortness of breath dyspnea     Patient Active Problem List   Diagnosis Date Noted  . Nausea & vomiting 01/23/2016  . 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 Fibrocystic changes of left breast 11/24/2015  . Chronic neck pain 06/07/2015  . GERD (gastroesophageal reflux disease) 03/08/2015  . Allergic rhinitis 03/08/2015  . Healthcare maintenance 03/08/2015  . History of cholecystectomy  03/08/2015  . Prediabetes 08/05/2014  . Hyperlipidemia 08/05/2014  . Asthma, chronic 08/04/2014  . Hypertension 08/04/2014  . Iron deficiency anemia 08/04/2014  . Obesity 08/04/2014  . History of diverticulitis 10/16/2013    Past Surgical History:  Procedure Laterality Date  . BARTHOLIN GLAND CYST EXCISION    . BREAST LUMPECTOMY WITH RADIOACTIVE SEED LOCALIZATION Left 01/23/2016   Procedure: BREAST LUMPECTOMY WITH RADIOACTIVE SEED LOCALIZATION;  Surgeon: Jackolyn Confer, MD;  Location: [facility];  Service: General;  Laterality: Left;  . CESAREAN SECTION  1989  . CHOLECYSTECTOMY  ~ 2000  . EPIGASTRIC HERNIA REPAIR  07/2003  . HERNIA REPAIR    . INCISION AND DRAINAGE ABSCESS  10/2005   Bartholin abscess.  . TONSILLECTOMY AND ADENOIDECTOMY  1983  . TUBAL LIGATION  1994    OB History    No data available       Home Medications    Prior to Admission medications   Medication Sig Start Date End Date Taking? Authorizing Provider  amLODipine (NORVASC) 10 MG tablet TAKE ONE TABLET BY MOUTH ONCE DAILY 03/19/16   Alphonzo Grieve, MD  cetirizine (ZYRTEC) 10 MG tablet Take 10 mg by mouth daily.    Historical Provider, MD  Cyanocobalamin (VITAMIN B-12 PO) Take 1 tablet by mouth daily.    Historical Provider, MD  fluticasone (FLONASE) 50 MCG/ACT nasal spray PLACE 2 SPRAYS INTO BOTH NOSTRILS DAILY 03/19/16   Alphonzo Grieve, MD  hydrochlorothiazide (HYDRODIURIL) 25  MG tablet Take 1 tablet (25 mg total) by mouth daily. 03/07/15   Juluis Mire, MD  HYDROcodone-acetaminophen (NORCO/VICODIN) 5-325 MG tablet Take 1-2 tablets by mouth every 4 (four) hours as needed for moderate pain or severe pain. 01/23/16   Jackolyn Confer, MD  magnesium hydroxide (PHILLIPS CHEWS) 311 MG CHEW chewable tablet Chew 311 mg by mouth every 4 (four) hours as needed (supplement).    Historical Provider, MD  SYMBICORT 80-4.5 MCG/ACT inhaler INHALE TWO PUFFS BY MOUTH INTO LUNGS TWICE DAILY 03/19/16   Alphonzo Grieve, MD    Family  History Family History  Problem Relation Age of Onset  . Cancer Mother     unknown  . Heart attack Father   . Heart attack Sister     Social History Social History  Substance Use Topics  . Smoking status: Former Smoker    Years: 1.00    Types: Cigarettes  . Smokeless tobacco: Never Used  . Alcohol use 0.0 oz/week     Comment: 10/16/2013 "glass of wine q 6 months or so"     Allergies   Amoxicillin; Ampicillin; Mobic [meloxicam]; and Montelukast sodium   Review of Systems Review of Systems  Musculoskeletal: Positive for myalgias. Negative for back pain.  Skin: Negative for color change.  Neurological: Negative for weakness and numbness.   Physical Exam Updated Vital Signs BP 148/84 (BP Location: Left Arm)   Pulse 96   Temp 97.8 F (36.6 C) (Oral)   Resp 22   LMP 05/28/2016   SpO2 100%   Physical Exam  Constitutional: She is oriented to person, place, and time. She appears well-developed and well-nourished.  HENT:  Head: Normocephalic and atraumatic.  Eyes: EOM are normal.  Neck: Normal range of motion.  Cardiovascular: Normal rate and intact distal pulses.   Pulmonary/Chest: Effort normal. No respiratory distress.  Musculoskeletal: Normal range of motion. She exhibits tenderness.  LLE: TTP to the left anterior quadriceps muscle. No palpable or visible deformities.   Neurological: She is alert and oriented to person, place, and time.  LLE: neurovascularly intact. Distal pulses appreciated.   Skin: Skin is warm and dry.  Psychiatric: She has a normal mood and affect.  Nursing note and vitals reviewed.  ED Treatments / Results  Labs (all labs ordered are listed, but only abnormal results are displayed) Labs Reviewed - No data to display  EKG  EKG Interpretation None       Radiology No results found.  Procedures Procedures  DIAGNOSTIC STUDIES: Oxygen Saturation is 100% on RA, normal by my interpretation.    COORDINATION OF CARE: 9:53 AM Discussed  next steps with pt. Pt verbalized understanding and is agreeable with the plan.    Medications Ordered in ED Medications - No data to display   Initial Impression / Assessment and Plan / ED Course  I have reviewed the triage vital signs and the nursing notes.  Pertinent labs & imaging results that were available during my care of the patient were reviewed by me and considered in my medical decision making (see chart for details).  Clinical Course    Final Clinical Impressions(s) / ED Diagnoses     {I have reviewed the relevant previous healthcare records.  {I obtained HPI from historian.   ED Course:  Assessment: Pt is a 50yF who presents with left quadriceps muscle pain. Pt works and is on her feet all day. NVI/ Distal pulses appreciated.. On exam, pt in NAD. Nontoxic/nonseptic appearing. VSS. Afebrile. Given robaxin in  ED. Plan is to DC home with follow up to PCP. At time of discharge, Patient is in no acute distress. Vital Signs are stable. Patient is able to ambulate. Patient able to tolerate PO.    Disposition/Plan:  DC Home Additional Verbal discharge instructions given and discussed with patient.  Pt Instructed to f/u with PCP in the next week for evaluation and treatment of symptoms. Return precautions given Pt acknowledges and agrees with plan  Supervising Physician Quintella Reichert, MD  Final diagnoses:  Strain of left quadriceps muscle, initial encounter    New Prescriptions New Prescriptions   No medications on file   I personally performed the services described in this documentation, which was scribed in my presence. The recorded information has been reviewed and is accurate.    Shary Decamp, PA-C 06/15/16 [place], MD 06/16/16 [phone]

## 2016-06-15 NOTE — Discharge Instructions (Signed)
Please read and follow all provided instructions.  Your diagnoses today include:  1. Strain of left quadriceps muscle, initial encounter     Tests performed today include: Vital signs. See below for your results today.   Medications prescribed:  Take as prescribed   Home care instructions:  Follow any educational materials contained in this packet.  Follow-up instructions: Please follow-up with your primary care provider for further evaluation of symptoms and treatment   Return instructions:  Please return to the Emergency Department if you do not get better, if you get worse, or new symptoms OR  - Fever (temperature greater than 101.31F)  - Bleeding that does not stop with holding pressure to the area    -Severe pain (please note that you may be more sore the day after your accident)  - Chest Pain  - Difficulty breathing  - Severe nausea or vomiting  - Inability to tolerate food and liquids  - Passing out  - Skin becoming red around your wounds  - Change in mental status (confusion or lethargy)  - New numbness or weakness    Please return if you have any other emergent concerns.  Additional Information:  Your vital signs today were: BP 148/84 (BP Location: Left Arm)    Pulse 96    Temp 97.8 F (36.6 C) (Oral)    Resp 22    LMP 05/28/2016    SpO2 100%  If your blood pressure (BP) was elevated above 135/85 this visit, please have this repeated by your doctor within one month. ---------------

## 2016-06-15 NOTE — ED Triage Notes (Signed)
Pt reports having bilateral hip pain on Saturday. Now its more left hip and radiates down left leg. Denies any injury. Ambulatory at triage.

## 2016-08-04 ENCOUNTER — Other Ambulatory Visit: Payer: Self-pay | Admitting: Internal Medicine

## 2016-08-04 DIAGNOSIS — J4540 Moderate persistent asthma, uncomplicated: Secondary | ICD-10-CM

## 2016-08-04 DIAGNOSIS — J309 Allergic rhinitis, unspecified: Secondary | ICD-10-CM

## 2016-08-10 NOTE — Progress Notes (Deleted)
   CC: ***  HPI:  [name] is a 51 y.o. with a PMH of HTN, Asthma, GERD, Fe-deficiency anemia, HLD, Prediabetes.  HTN: Patient is on HCTZ 25mg  daily and amlodipine 10mg  daily  Iron deficiency anemia: Fe of 17 on 08/04/2014. Was prescried FeSO4 325mg  daily. Menorrhagia? Fibroids?>pelvic ultrasound?  Neck pain: referred to ortho  Please see problem based Assessment and Plan for status of patients chronic conditions.  Past Medical History:  Diagnosis Date  . Anemia   . Asthma   . Bursitis of left hip   . Decreased appetite   . GERD (gastroesophageal reflux disease)   . Headache(784.0)    "1-2 times/month; comes from stress & allergies"  . History of hiatal hernia    repaired  . History of stomach ulcers   . Hypertension   . Migraine    "maybe twice in the last 10 yrs" (10/16/2013)  . Shortness of breath dyspnea     Review of Systems:   ROS  Physical Exam:  There were no vitals filed for this visit. Physical Exam  Assessment & Plan:   See Encounters Tab for problem based charting.   Patient {GC/GE:[id number]::"discussed with","seen with"} Dr. {NAMES:[id number]::"Butcher","Granfortuna","E. Hoffman","Klima","Mullen","Narendra","Vincent"}   Alphonzo Grieve, MD Internal Medicine PGY1

## 2016-08-11 ENCOUNTER — Encounter: Payer: Self-pay | Admitting: Internal Medicine

## 2016-08-11 ENCOUNTER — Ambulatory Visit (INDEPENDENT_AMBULATORY_CARE_PROVIDER_SITE_OTHER): Payer: BLUE CROSS/BLUE SHIELD | Admitting: Internal Medicine

## 2016-08-11 ENCOUNTER — Encounter (INDEPENDENT_AMBULATORY_CARE_PROVIDER_SITE_OTHER): Payer: Self-pay

## 2016-08-11 VITALS — BP 163/80 | HR 83 | Temp 98.8°F | Ht 61.0 in | Wt 179.2 lb

## 2016-08-11 DIAGNOSIS — D509 Iron deficiency anemia, unspecified: Secondary | ICD-10-CM

## 2016-08-11 DIAGNOSIS — Z0000 Encounter for general adult medical examination without abnormal findings: Secondary | ICD-10-CM

## 2016-08-11 DIAGNOSIS — R937 Abnormal findings on diagnostic imaging of other parts of musculoskeletal system: Secondary | ICD-10-CM

## 2016-08-11 DIAGNOSIS — J4520 Mild intermittent asthma, uncomplicated: Secondary | ICD-10-CM

## 2016-08-11 DIAGNOSIS — M79605 Pain in left leg: Secondary | ICD-10-CM

## 2016-08-11 DIAGNOSIS — M79604 Pain in right leg: Secondary | ICD-10-CM | POA: Diagnosis not present

## 2016-08-11 DIAGNOSIS — Z87891 Personal history of nicotine dependence: Secondary | ICD-10-CM

## 2016-08-11 DIAGNOSIS — I10 Essential (primary) hypertension: Secondary | ICD-10-CM

## 2016-08-11 MED ORDER — DICLOFENAC SODIUM 1 % TD GEL: 4.0000 g | Freq: Four times a day (QID) | TRANSDERMAL | 0 refills | Status: DC

## 2016-08-11 MED ORDER — FERROUS SULFATE 325 (65 FE) MG PO TABS: 325.0000 mg | ORAL_TABLET | Freq: Every day | ORAL | 3 refills | Status: DC

## 2016-08-11 MED ORDER — ALBUTEROL SULFATE HFA 108 (90 BASE) MCG/ACT IN AERS: 2.0000 | INHALATION_SPRAY | Freq: Four times a day (QID) | RESPIRATORY_TRACT | 2 refills | Status: DC | PRN

## 2016-08-11 NOTE — Patient Instructions (Addendum)
It was a pleasure to meet you today [name],   START taking iron supplementation  START taking voltaren gel  START taking albuterol inhaler when you have difficulty breathing   Come back sometime next week to have labwork done   Please schedule a follow up appointment in 2 months      Back Exercises Introduction If you have pain in your back, do these exercises 2-3 times each day or as told by your doctor. When the pain goes away, do the exercises once each day, but repeat the steps more times for each exercise (do more repetitions). If you do not have pain in your back, do these exercises once each day or as told by your doctor. Exercises Single Knee to Chest  Do these steps 3-5 times in a row for each leg: 1. Lie on your back on a firm bed or the floor with your legs stretched out. 2. Bring one knee to your chest. 3. Hold your knee to your chest by grabbing your knee or thigh. 4. Pull on your knee until you feel a gentle stretch in your lower back. 5. Keep doing the stretch for 10-30 seconds. 6. Slowly let go of your leg and straighten it. Pelvic Tilt  Do these steps 5-10 times in a row: 1. Lie on your back on a firm bed or the floor with your legs stretched out. 2. Bend your knees so they point up to the ceiling. Your feet should be flat on the floor. 3. Tighten your lower belly (abdomen) muscles to press your lower back against the floor. This will make your tailbone point up to the ceiling instead of pointing down to your feet or the floor. 4. Stay in this position for 5-10 seconds while you gently tighten your muscles and breathe evenly. Cat-Cow  Do these steps until your lower back bends more easily: 1. Get on your hands and knees on a firm surface. Keep your hands under your shoulders, and keep your knees under your hips. You may put padding under your knees. 2. Let your head hang down, and make your tailbone point down to the floor so your lower back is round like the  back of a cat. 3. Stay in this position for 5 seconds. 4. Slowly lift your head and make your tailbone point up to the ceiling so your back hangs low (sags) like the back of a cow. 5. Stay in this position for 5 seconds. Press-Ups  Do these steps 5-10 times in a row: 1. Lie on your belly (face-down) on the floor. 2. Place your hands near your head, about shoulder-width apart. 3. While you keep your back relaxed and keep your hips on the floor, slowly straighten your arms to raise the top half of your body and lift your shoulders. Do not use your back muscles. To make yourself more comfortable, you may change where you place your hands. 4. Stay in this position for 5 seconds. 5. Slowly return to lying flat on the floor. Bridges  Do these steps 10 times in a row: 1. Lie on your back on a firm surface. 2. Bend your knees so they point up to the ceiling. Your feet should be flat on the floor. 3. Tighten your butt muscles and lift your butt off of the floor until your waist is almost as high as your knees. If you do not feel the muscles working in your butt and the back of your thighs, slide your feet  1-2 inches farther away from your butt. 4. Stay in this position for 3-5 seconds. 5. Slowly lower your butt to the floor, and let your butt muscles relax. If this exercise is too easy, try doing it with your arms crossed over your chest. Belly Crunches  Do these steps 5-10 times in a row: 1. Lie on your back on a firm bed or the floor with your legs stretched out. 2. Bend your knees so they point up to the ceiling. Your feet should be flat on the floor. 3. Cross your arms over your chest. 4. Tip your chin a little bit toward your chest but do not bend your neck. 5. Tighten your belly muscles and slowly raise your chest just enough to lift your shoulder blades a tiny bit off of the floor. 6. Slowly lower your chest and your head to the floor. Back Lifts  Do these steps 5-10 times in a row: 1. Lie  on your belly (face-down) with your arms at your sides, and rest your forehead on the floor. 2. Tighten the muscles in your legs and your butt. 3. Slowly lift your chest off of the floor while you keep your hips on the floor. Keep the back of your head in line with the curve in your back. Look at the floor while you do this. 4. Stay in this position for 3-5 seconds. 5. Slowly lower your chest and your face to the floor. Contact a doctor if:  Your back pain gets a lot worse when you do an exercise.  Your back pain does not lessen 2 hours after you exercise. If you have any of these problems, stop doing the exercises. Do not do them again unless your doctor says it is okay. Get help right away if:  You have sudden, very bad back pain. If this happens, stop doing the exercises. Do not do them again unless your doctor says it is okay. This information is not intended to replace advice given to you by your health care provider. Make sure you discuss any questions you have with your health care provider. Document Released: 07/24/2010 Document Revised: 11/27/2015 Document Reviewed: 08/15/2014  2017 [employer]

## 2016-08-11 NOTE — Progress Notes (Signed)
CC: leg pain   HPI: [name] is a 51 y.o. with past medical history as outlined below who presents to clinic for follow up of asthma, leg pain, and iron deficiency anemia.   Leg pain  She describes bilateral leg pain that has been ongoing for years. Its a shooting pain that moves between her legs and affects different areas of her legs. Most recently it has affected her left posterior thigh, and sometimes her left calf and ankle. It is worse when she is sitting for extended periods of time and it disrupts her sleep at night. She denies a restless feeling or an urge to get up and walk around while she is resting at night. Tylenol helps the pain at times. She has tried her mothers Voltaren prescription in the past and that helped to relieve the pain. Review of her chart reveals a lumbar spine xray in 2017 which showed possible scoliosis but no lumbar spine deformities.   Asthma Reports episodes of chest heaviness and heavy breathing in which she has to stop what she's doing and consciously control her breathing. She feels that this is similar to asthma attacks that she has had in the past but it has been happening more frequently recently. She has been using old albuterol inhalers and does not feel like these have helped to relieve the symptoms.   Hypertension  Blood pressure today is 163/80, she forgot to take her medications today and has had BP well controlled when she took her medications in the past.   Iron deficiency anemia  Reports stopped taking iron supplementation when she ran out of tablets after last prescription. Menses last 5 days, she uses about 2 pads per day. Denies bleeding between menses. Denies bright red blood per rectum or black stools.   Please see problem list for status of the pt's chronic medical problems.  Past Medical History:  Diagnosis Date  . Anemia   . Asthma   . Bursitis of left hip   . Decreased appetite   . GERD (gastroesophageal reflux disease)    . Headache(784.0)    "1-2 times/month; comes from stress & allergies"  . History of hiatal hernia    repaired  . History of stomach ulcers   . Hypertension   . Migraine    "maybe twice in the last 10 yrs" (10/16/2013)  . Shortness of breath dyspnea     Review of Systems:  Please see each problem below for a pertinent review of systems.  Physical Exam:  Vitals:   08/11/16 1551  BP: (!) 163/80  Pulse: 83  Temp: 98.8 F (37.1 C)  TempSrc: Oral  SpO2: 100%  Weight: 179 lb 3.2 oz (81.3 kg)  Height: 5\' 1"  (1.549 m)   Physical Exam  Constitutional: She appears well-developed and well-nourished. No distress.  HENT:  Head: Normocephalic and atraumatic.  Cardiovascular: Normal rate and regular rhythm.   No murmur heard. Pulmonary/Chest: Effort normal and breath sounds normal. No respiratory distress. She has no wheezes.  Musculoskeletal:  B/l hip, knee, and ankle strength intact.  Worsening of burning upper leg pain with left hip strength testing.  Tenderness to palpation over quadriceps   Skin: She is not diaphoretic.   Assessment & Plan:   See Encounters Tab for problem based charting.  Leg pain  Cause of this leg pain is unclear, differentials include electrolyte abnormality, vitamin deficiency or musculoskeletal or related to sciatica. Has a hx of iron deficiency which has gone untreated  and could be another cause. Will need further testing with lab work for magnesium, BMP ( to assess for K), vitamin D level and ferritin. The lab is closed now but pt has agreed to come back sometime next week for this labwork. She has requested an rx for voltaren gel.   - prescribed trial of volataren gel  -follow up mag, BMP, vit D, ferritin   Asthma  Worsening of her asthma may be related to seasonal allergies and congestion. Have advised her to continue taking zyrtec and furosemide. Have also prescribed a new rx for albuterol inhaler and will place orders to try and get her an albuterol  nebulizer.  - prescribed albuterol nebs  -order for nebulizer machine placed - prescribed albuterol inhaler  -continue zyrtec and flonase   Iron deficiency anemia  Cause of this iron deficiency is unclear, she denies heavy menstrual flow, blood per rectum, or black stools. Most likely cause is related to her menstrual periods. Will restart iron supplementation today. If anemia has not improved at follow up may consider further testing for celiac disease.  - follow up CBC at next office visit  - prescribed ferrous sulfate 325 mg daily   Patient discussed with Dr. Angelia Mould

## 2016-08-11 NOTE — Assessment & Plan Note (Signed)
Got flu shot through work in December 2017

## 2016-08-12 ENCOUNTER — Encounter: Payer: Self-pay | Admitting: Internal Medicine

## 2016-08-12 DIAGNOSIS — M79606 Pain in leg, unspecified: Secondary | ICD-10-CM | POA: Insufficient documentation

## 2016-08-12 MED ORDER — ALBUTEROL SULFATE (2.5 MG/3ML) 0.083% IN NEBU: 2.5000 mg | INHALATION_SOLUTION | Freq: Four times a day (QID) | RESPIRATORY_TRACT | 12 refills | Status: DC | PRN

## 2016-08-12 NOTE — Assessment & Plan Note (Signed)
Cause of this leg pain is unclear, differentials include electrolyte abnormality, vitamin deficiency or musculoskeletal or related to sciatica. Has a hx of iron deficiency which has gone untreated and could be another cause. Will need further testing with lab work for magnesium, BMP ( to assess for K), vitamin D level and ferritin. The lab is closed now but pt has agreed to come back sometime next week for this labwork. She has requested an rx for voltaren gel.   - prescribed trial of volataren gel  -follow up mag, BMP, vit D, ferritin

## 2016-08-12 NOTE — Assessment & Plan Note (Signed)
Worsening of her asthma may be related to seasonal allergies and congestion. Have advised her to continue taking zyrtec and furosemide. Have also prescribed a new rx for albuterol inhaler and will place orders to try and get her an albuterol nebulizer.  - prescribed albuterol nebs  -order for nebulizer machine placed - prescribed albuterol inhaler  -continue zyrtec and flonase

## 2016-08-12 NOTE — Assessment & Plan Note (Addendum)
Cause of this iron deficiency is unclear, she denies heavy menstrual flow, blood per rectum, or black stools. Most likely cause is related to menses. Will restart iron supplementation today. If anemia has not improved at follow up may consider further testing for celiac disease.  - follow up CBC at next office visit  - prescribed ferrous sulfate 325 mg daily

## 2016-08-13 ENCOUNTER — Telehealth: Payer: Self-pay | Admitting: *Deleted

## 2016-08-13 NOTE — Telephone Encounter (Addendum)
Information sent through CoverMyMeds for PA for Voltaren Gel1%.Sander Nephew, RN 08/13/2016 11:57 AM  PA for Voltaren Gel was approved 08/23/2016 thru 07/04/2038. Patient stated when called that she has Osteoarthritis of the knees.  Sander Nephew, RN 08/23/2016 2:14 PM

## 2016-08-16 NOTE — Progress Notes (Signed)
Internal Medicine Clinic Attending  Case discussed with Dr. Blum at the time of the visit.  We reviewed the resident's history and exam and pertinent patient test results.  I agree with the assessment, diagnosis, and plan of care documented in the resident's note. 

## 2016-08-19 NOTE — Telephone Encounter (Signed)
Calling back about PA for Voltaren gel. Please call back. i

## 2016-09-28 ENCOUNTER — Encounter (INDEPENDENT_AMBULATORY_CARE_PROVIDER_SITE_OTHER): Payer: Self-pay

## 2016-09-28 ENCOUNTER — Other Ambulatory Visit (INDEPENDENT_AMBULATORY_CARE_PROVIDER_SITE_OTHER): Payer: BLUE CROSS/BLUE SHIELD

## 2016-09-28 DIAGNOSIS — M79605 Pain in left leg: Secondary | ICD-10-CM

## 2016-09-28 DIAGNOSIS — D509 Iron deficiency anemia, unspecified: Secondary | ICD-10-CM

## 2016-09-28 DIAGNOSIS — M79604 Pain in right leg: Secondary | ICD-10-CM | POA: Diagnosis not present

## 2016-09-29 LAB — BMP8+ANION GAP
ANION GAP: 15 mmol/L (ref 10.0–18.0)
BUN/Creatinine Ratio: 23 (ref 9–23)
BUN: 12 mg/dL (ref 6–24)
CALCIUM: 10 mg/dL (ref 8.7–10.2)
CHLORIDE: 100 mmol/L (ref 96–106)
CO2: 26 mmol/L (ref 18–29)
Creatinine, Ser: 0.52 mg/dL — ABNORMAL LOW (ref 0.57–1.00)
GFR calc non Af Amer: 113 mL/min/{1.73_m2} (ref >59)
GFR, EST AFRICAN AMERICAN: 131 mL/min/{1.73_m2} (ref >59)
GLUCOSE: 146 mg/dL — AB (ref 65–99)
POTASSIUM: 3.9 mmol/L (ref 3.5–5.2)
Sodium: 141 mmol/L (ref 134–144)

## 2016-09-29 LAB — MAGNESIUM: MAGNESIUM: 1.8 mg/dL (ref 1.6–2.3)

## 2016-09-29 LAB — FERRITIN: Ferritin: 17 ng/mL (ref 15–150)

## 2016-09-29 LAB — VITAMIN D 25 HYDROXY (VIT D DEFICIENCY, FRACTURES): VIT D 25 HYDROXY: 16.9 ng/mL — AB (ref 30.0–100.0)

## 2016-09-30 ENCOUNTER — Telehealth: Payer: Self-pay | Admitting: Internal Medicine

## 2016-09-30 MED ORDER — CHOLECALCIFEROL 25 MCG (1000 UT) PO CHEW: 1000.0000 [IU] | CHEWABLE_TABLET | Freq: Every day | ORAL | 2 refills | Status: DC

## 2016-09-30 NOTE — Telephone Encounter (Signed)
Pt evaluated in clinic by Dr. Hetty Ely in February for leg pain and discomfort and labs were ordered to check electrolytes, vit D and ferritin as deficiencies could be cause of this discomfort. Patient had her labs drawn earlier this week.  BMet unremarkable Ferritin low normal Magnesium normal VitD3 16 (low)  Will supplement with 1000U cholecalciferol daily.  Called patient to discuss results and advise of vit D supplementation. She requested if other form rather than pill would be available. No further questions.  Plan: --cholecalciferol 1000 units daily chewable tabs --check vit D level in 3 months\  Alphonzo Grieve, MD [facility] - PGY1 Pager [phone]

## 2016-10-29 ENCOUNTER — Telehealth: Payer: Self-pay

## 2016-10-29 NOTE — Telephone Encounter (Signed)
Need to speak with a nurse about immunization. Please call back.

## 2016-11-09 NOTE — Telephone Encounter (Signed)
No answer, I think another staff member addressed this

## 2016-11-17 ENCOUNTER — Encounter (INDEPENDENT_AMBULATORY_CARE_PROVIDER_SITE_OTHER): Payer: Self-pay

## 2016-11-17 ENCOUNTER — Ambulatory Visit (INDEPENDENT_AMBULATORY_CARE_PROVIDER_SITE_OTHER): Payer: BLUE CROSS/BLUE SHIELD | Admitting: Internal Medicine

## 2016-11-17 ENCOUNTER — Encounter: Payer: Self-pay | Admitting: Internal Medicine

## 2016-11-17 VITALS — BP 140/79 | HR 79 | Temp 98.5°F | Ht 61.0 in | Wt 176.5 lb

## 2016-11-17 DIAGNOSIS — J45909 Unspecified asthma, uncomplicated: Secondary | ICD-10-CM

## 2016-11-17 DIAGNOSIS — M79604 Pain in right leg: Secondary | ICD-10-CM

## 2016-11-17 DIAGNOSIS — N6012 Diffuse cystic mastopathy of left breast: Secondary | ICD-10-CM

## 2016-11-17 DIAGNOSIS — D509 Iron deficiency anemia, unspecified: Secondary | ICD-10-CM | POA: Diagnosis not present

## 2016-11-17 DIAGNOSIS — I10 Essential (primary) hypertension: Secondary | ICD-10-CM

## 2016-11-17 DIAGNOSIS — J4521 Mild intermittent asthma with (acute) exacerbation: Secondary | ICD-10-CM

## 2016-11-17 DIAGNOSIS — R7303 Prediabetes: Secondary | ICD-10-CM

## 2016-11-17 DIAGNOSIS — J4520 Mild intermittent asthma, uncomplicated: Secondary | ICD-10-CM

## 2016-11-17 DIAGNOSIS — Z87891 Personal history of nicotine dependence: Secondary | ICD-10-CM

## 2016-11-17 DIAGNOSIS — M79605 Pain in left leg: Secondary | ICD-10-CM

## 2016-11-17 DIAGNOSIS — Z7251 High risk heterosexual behavior: Secondary | ICD-10-CM | POA: Diagnosis not present

## 2016-11-17 MED ORDER — NAPROXEN 500 MG PO TABS: 500.0000 mg | ORAL_TABLET | Freq: Two times a day (BID) | ORAL | 1 refills | Status: DC

## 2016-11-17 MED ORDER — AMLODIPINE BESYLATE 10 MG PO TABS: 10.0000 mg | ORAL_TABLET | Freq: Every day | ORAL | 2 refills | Status: DC

## 2016-11-17 MED ORDER — BUDESONIDE-FORMOTEROL FUMARATE 80-4.5 MCG/ACT IN AERO: INHALATION_SPRAY | RESPIRATORY_TRACT | 11 refills | Status: DC

## 2016-11-17 NOTE — Progress Notes (Signed)
CC: leg pain  HPI:  [name] is a 51 y.o. with a PMH of iron deficiency anemia, vitamin D deficiency, asthma, hypertension, presenting to clinic for leg pain.   Patient was last seen in February for bilateral leg pain that had been present for years. At that time, a work up including Bmet, Vit D, Ferritin, and magnesium were checked - she was found to have continued anemia and vitamin D deficiency; she was encouraged to take her iron supplementation regularly and was placed on Vit D supplementation. Today she returns to clinic for continued leg pain, that is more bothersome in her right leg. She states it had localized in the right leg a few weeks ago and is mainly bothersome in her lateral hip and anterior thigh. She has been taking OTC ibuprofen and tylenol with some relief for a couple of hours but the pain and discomfort always return. This keeps her from sleeping at night; she has found that lying on a heating pad will help some. She denies prior trauma; her job allows her to both sit and walk about an equal period of time; she denies any particular activity or position that alleviates or worsens her pain. 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   Please see problem based Assessment and Plan for status of patients chronic conditions.  Past Medical History:  Diagnosis Date  . Anemia   . Asthma   . Bursitis of left hip   . Decreased appetite   . GERD (gastroesophageal reflux disease)   . Headache(784.0)    "1-2 times/month; comes from stress & allergies"  . History of hiatal hernia    repaired  . History of stomach ulcers   . Hypertension   . Migraine    "maybe twice in the last 10 yrs" (10/16/2013)  . Shortness of breath dyspnea     Review of Systems:   Review of Systems  Constitutional: Negative for chills, fever and malaise/fatigue.  Cardiovascular: Negative for chest pain, claudication and leg swelling.  Gastrointestinal: Negative for  abdominal pain, blood in stool, constipation, diarrhea, heartburn, melena, nausea and vomiting.  Musculoskeletal: Positive for myalgias. Negative for back pain and joint pain.  Neurological: Negative for sensory change, focal weakness and weakness.    Physical Exam:  Vitals:   11/17/16 1316  BP: 140/79  Pulse: 79  Temp: 98.5 F (36.9 C)  TempSrc: Oral  SpO2: 100%  Weight: 176 lb 8 oz (80.1 kg)  Height: 5\' 1"  (1.549 m)   Physical Exam  Constitutional: She is oriented to person, place, and time. She appears well-developed and well-nourished. No distress.  HENT:  Head: Normocephalic and atraumatic.  Eyes: EOM are normal.  Neck: Normal range of motion.  Cardiovascular: Normal rate, regular rhythm, normal heart sounds and intact distal pulses.   Pulmonary/Chest: Effort normal and breath sounds normal.  Abdominal: Soft. Bowel sounds are normal. She exhibits no distension and no mass. There is no tenderness. There is no guarding.  Musculoskeletal: Normal range of motion. She exhibits no edema, tenderness or deformity.  Passive ROM, strength, and sensation intact in bil LEs. No spinal, paraspinal, or SI joint tenderness. No muscle tenderness in hip or thigh. Pain elicited with flexion of bil hips and internal rotation of right LE.  Neurological: She is alert and oriented to person, place, and time. No cranial nerve deficit or sensory deficit. She exhibits normal muscle tone.  Skin: Skin is warm and dry. Capillary refill takes less  than 2 seconds. No rash noted. No erythema. No pallor.  Psychiatric: She has a normal mood and affect. Her behavior is normal.    Assessment & Plan:   See Encounters Tab for problem based charting.   Patient discussed with Dr. Angelia Mould   Alphonzo Grieve, MD Internal Medicine PGY1

## 2016-11-17 NOTE — Patient Instructions (Signed)
Please take Naproxen every 12 hours. Please take your vitamin D and Iron supplemets daily as these will help with your pain.   I have placed a referral for Physical therapy.  I have also placed an order for your yearly mamogram.

## 2016-11-18 ENCOUNTER — Encounter: Payer: Self-pay | Admitting: Internal Medicine

## 2016-11-18 LAB — HEMOGLOBIN A1C
ESTIMATED AVERAGE GLUCOSE: 131 mg/dL
Hgb A1c MFr Bld: 6.2 % — ABNORMAL HIGH (ref 4.8–5.6)

## 2016-11-18 LAB — HIV ANTIBODY (ROUTINE TESTING W REFLEX): HIV SCREEN 4TH GENERATION: NONREACTIVE

## 2016-11-18 LAB — CBC
HEMOGLOBIN: 10.8 g/dL — AB (ref 11.1–15.9)
Hematocrit: 33.7 % — ABNORMAL LOW (ref 34.0–46.6)
MCH: 26.5 pg — ABNORMAL LOW (ref 26.6–33.0)
MCHC: 32 g/dL (ref 31.5–35.7)
MCV: 83 fL (ref 79–97)
Platelets: 340 10*3/uL (ref 150–379)
RBC: 4.08 x10E6/uL (ref 3.77–5.28)
RDW: 15 % (ref 12.3–15.4)
WBC: 9.3 10*3/uL (ref 3.4–10.8)

## 2016-11-18 LAB — FERRITIN: FERRITIN: 15 ng/mL (ref 15–150)

## 2016-11-18 MED ORDER — VITAMIN D 1000 UNITS PO TABS: 1000.0000 [IU] | ORAL_TABLET | Freq: Every day | ORAL | 2 refills | Status: DC

## 2016-11-18 NOTE — Assessment & Plan Note (Addendum)
Patient with long history of vague, nonfocal, bilateral leg pain that is now more localized to her R LE. Patient states it had localized in the right leg a few weeks ago and is mainly bothersome in her lateral hip and anterior thigh. She has been taking OTC ibuprofen and tylenol with some relief for a couple of hours but the pain and discomfort always return. This keeps her from sleeping at night; she has found that lying on a heating pad will help some. She denies prior trauma; her job allows her to both sit and walk about an equal period of time; she denies any particular activity or position that alleviates or worsens her pain. 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 She has been inconsistent with taking her iron and vit D supplementation.  Differential diagnosis included sciatica (does not match her complaints or exam), bursitis (does not match location of discomfort or exam), meralgia paresthetica, or associated with her developmental hip dysplasia. She does not indicate symptoms of claudication. Due to the nature of her discomfort, and nonfocal exam, I think making sure she is adequately supplementing her iron and vit D is important before further diagnostic testing is undertaken.   Plan: --encouraged regular iron and vit D supplementation --Naproxen 500mg  BID with meals --referral to physical therapy

## 2016-11-18 NOTE — Assessment & Plan Note (Signed)
Ordered mammogram. Patient denies breast skin changes, lumps, or nipple discharge.

## 2016-11-18 NOTE — Assessment & Plan Note (Addendum)
Patient with iron deficiency anemia who reports inconsistent use of iron supplementation. I think that some of her leg pain, which is nonfocal, may be due in part to her iron deficiency.   Plan: --check ferritin and CBC today - Ferritin 15 (borderline low); Hgb 10.8 --advised on consistent use of iron supplemenatation --if still low with adequate supplementation at next visit, may need workup for celiac disease.

## 2016-11-18 NOTE — Assessment & Plan Note (Signed)
Patient hypertensive today, even at recheck, and at previous visit. She is only on amlodipine 10mg  daily and reports taking her medicine this morning. She denies chest pain, shortness of breath, palpitations. She is not interested in changing her antihypertensive therapy at this time; she states it is elevated because she is tired. She is in agreement with discussing management again at f/u appointment if still hypertensive.

## 2016-11-18 NOTE — Assessment & Plan Note (Signed)
Refilled Symbicort

## 2016-11-18 NOTE — Assessment & Plan Note (Signed)
Overweight patient with last A1c of 6.1.   Plan: --check A1c - 6.2 - stable --counsel of weight loss and dietary changes --recheck in about 1 year

## 2016-11-19 NOTE — Progress Notes (Signed)
Internal Medicine Clinic Attending  Case discussed with Dr. Svalina  at the time of the visit.  We reviewed the resident's history and exam and pertinent patient test results.  I agree with the assessment, diagnosis, and plan of care documented in the resident's note.  

## 2016-11-22 ENCOUNTER — Encounter: Payer: Self-pay | Admitting: Physical Therapy

## 2016-11-22 ENCOUNTER — Ambulatory Visit: Payer: BLUE CROSS/BLUE SHIELD | Attending: Internal Medicine | Admitting: Physical Therapy

## 2016-11-22 DIAGNOSIS — M79605 Pain in left leg: Secondary | ICD-10-CM | POA: Insufficient documentation

## 2016-11-22 DIAGNOSIS — M79604 Pain in right leg: Secondary | ICD-10-CM | POA: Insufficient documentation

## 2016-11-22 DIAGNOSIS — R262 Difficulty in walking, not elsewhere classified: Secondary | ICD-10-CM | POA: Insufficient documentation

## 2016-11-22 NOTE — Therapy (Signed)
[place], [place], [address] Phone: [phone]   Fax:  [phone]  Physical Therapy Evaluation  Patient Details  Name: [name] MRN: [id number] Date of Birth: 25-Mar-1966 Referring Provider: Alphonzo Grieve, MD  Encounter Date: 11/22/2016      PT End of Session - 11/22/16 1636    Visit Number 1   Number of Visits 17   Date for PT Re-Evaluation 01/21/17   Authorization Type [employer] 30 visit limit   PT Start Time 1630   PT Stop Time 1723   PT Time Calculation (min) 53 min   Activity Tolerance Patient tolerated treatment well   Behavior During Therapy [facility] for tasks assessed/performed      Past Medical History:  Diagnosis Date  . Anemia   . Asthma   . Bursitis of left hip   . Decreased appetite   . GERD (gastroesophageal reflux disease)   . History of hiatal hernia    repaired  . History of stomach ulcers   . Hypertension   . Migraine    "maybe twice in the last 10 yrs" (10/16/2013)  . Shortness of breath dyspnea     Past Surgical History:  Procedure Laterality Date  . BARTHOLIN GLAND CYST EXCISION    . BREAST LUMPECTOMY WITH RADIOACTIVE SEED LOCALIZATION Left 01/23/2016   Procedure: BREAST LUMPECTOMY WITH RADIOACTIVE SEED LOCALIZATION;  Surgeon: Jackolyn Confer, MD;  Location: [place];  Service: General;  Laterality: Left;  . CESAREAN SECTION  1989  . CHOLECYSTECTOMY  ~ 2000  . EPIGASTRIC HERNIA REPAIR  07/2003  . HERNIA REPAIR    . INCISION AND DRAINAGE ABSCESS  10/2005   Bartholin abscess.  . TONSILLECTOMY AND ADENOIDECTOMY  1983  . TUBAL LIGATION  1994    There were no vitals filed for this visit.       Subjective Assessment - 11/22/16 1637    Subjective Tried muscle relaxers but just put her to sleep and would wake in pain. Pain began in L thigh a couple of years ago and began moving to both legs. Recently pain is concentrated in R hip. Feels like being stuck with pins and  needles. Rubs in anterior groin which helps after pressing really hard for a while. Whirlpool was helpful in the past. Recently has been losing some weight (about 10lb down now). Difficult to fall asleep due to pain. Sitting and standing are both bothersome. Flares every day. R side SIJ pain with flare ups.   Patient Stated Goals work, get into/out of car, sleep, decrease pin   Currently in Pain? Yes   Pain Score 7    Pain Location Hip   Pain Orientation Right   Pain Descriptors / Indicators Spasm;Burning;Pins and needles   Aggravating Factors  everything when in pain    Pain Relieving Factors whirlpool, exercise            [facility] PT Assessment - 11/22/16 0001      Assessment   Medical Diagnosis pain in both lower extremities   Referring Provider Alphonzo Grieve, MD   Onset Date/Surgical Date --  a few years ago, recently focused to R   Hand Dominance Left   Next MD Visit 5/30   Prior Therapy not this year     Precautions   Precautions None     Restrictions   Weight Bearing Restrictions No     Balance Screen   Has the patient fallen in the past 6 months No  Home Environment   Living Environment Private residence   Living Arrangements Other relatives   Additional Comments 4 steps to enter home     Prior Function   Vocation Full time employment  CNA     Cognition   Overall Cognitive Status Within Functional Limits for tasks assessed     Observation/Other Assessments   Focus on Therapeutic Outcomes (FOTO)  25% ability (goal 50%)     Sensation   Additional Comments [facility]     Posture/Postural Control   Posture Comments anterior pelvic tilt, lacking extension bilateral knees     ROM / Strength   AROM / PROM / Strength --  not tested at eval due to notable pelvic rotation     Palpation   Palpation comment TTP R SIJ>L; no TTP bilat G troch bursa; R post/L ant innom rot                   [facility] Adult PT Treatment/Exercise - 11/22/16 0001       Exercises   Exercises Knee/Hip     Knee/Hip Exercises: Stretches   Other Knee/Hip Stretches thomas test stretch     Knee/Hip Exercises: Standing   Other Standing Knee Exercises glut sets     Knee/Hip Exercises: Supine   Other Supine Knee/Hip Exercises hooklying iso ADD     Modalities   Modalities Moist Heat     Moist Heat Therapy   Number Minutes Moist Heat 10 Minutes  concurrent with HEP and POC education   Moist Heat Location Hip  R, anterior     Manual Therapy   Manual Therapy Muscle Energy Technique   Muscle Energy Technique R quads-L HS to iso add                PT Education - 11/22/16 1717    Education provided Yes   Education Details anatomy of condition, POC, HEP, exercise form/rationale   Person(s) Educated Patient   Methods Explanation;Demonstration;Tactile cues;Verbal cues;Handout   Comprehension Verbalized understanding;Returned demonstration;Verbal cues required;Tactile cues required;Need further instruction          PT Short Term Goals - 11/22/16 1731      PT SHORT TERM GOAL #1   Title Pt will verbalize average concordant pain <=5/10 with daily activities by 6/15   Baseline 7/10 today- not flared up today   Time 3   Period Weeks   Status New     PT SHORT TERM GOAL #2   Title Pt will verbalize ability to maintain neutral posture and utilize stretches throughout the day to manage pain and reduce risk of rotation   Baseline began educating at eval   Time 3   Period Weeks   Status New           PT Long Term Goals - 11/22/16 1732      PT LONG TERM GOAL #1   Title Gross hip MMT 5/5 to indicate appropraite support to lumbopelvic region by surrounding soft tissue by 7/20   Baseline NT at eval due to pain and notable innominate rotation   Time 8   Period Weeks   Status New     PT LONG TERM GOAL #2   Title Pt will be able to perform work duties with pin <=3/10   Baseline severe, off the charts at eval   Time 8   Period Weeks    Status New     PT LONG TERM GOAL #3   Title FOTO to 50%  ability to indicate significant improvement in functional ability   Baseline  25% ability at eval   Time 8   Period Weeks   Status New     PT LONG TERM GOAL #4   Title Pt will be able to get into and out of cars without pain   Baseline creates compensatory patterns due to pain at eval   Time 8   Period Weeks   Status New               Plan - 11/22/16 1728    Clinical Impression Statement Pt presents to PT with complaints of bilateral leg pain that began in just the L and has progressed through both legs and is now concentrated on R. Notable innominate rotation was corrected with MET today and pt verbalized decrease in concordant pain. Pt will benefit from skilled PT in order to improve lumbopelvic stability and decrease spasm as result of change in length-tension relationship. Pt was educated on importance of perofrming HEP, not sleeping on stomach and avoiding crossing legs to avoid rotation.    Rehab Potential Good   PT Frequency 2x / week   PT Duration 8 weeks   PT Treatment/Interventions ADLs/Self Care Home Management;Cryotherapy;Electrical Stimulation;Iontophoresis '4mg'$ /ml Dexamethasone;Functional mobility training;Stair training;Gait training;Ultrasound;Traction;Moist Heat;Therapeutic activities;Therapeutic exercise;Balance training;Neuromuscular re-education;Patient/family education;Passive range of motion;Manual techniques;Dry needling;Taping   PT Next Visit Plan check pelvic rotation, pelvic stability   PT Home Exercise Plan thomas test stretch, standing glut sets, iso ADD;    Consulted and Agree with Plan of Care Patient      Patient will benefit from skilled therapeutic intervention in order to improve the following deficits and impairments:  Difficulty walking, Increased muscle spasms, Decreased activity tolerance, Pain, Improper body mechanics, Impaired flexibility, Decreased strength, Postural  dysfunction  Visit Diagnosis: Pain in right leg - Plan: PT plan of care cert/re-cert  Pain in left leg - Plan: PT plan of care cert/re-cert  Difficulty in walking, not elsewhere classified - Plan: PT plan of care cert/re-cert     Problem List Patient Active Problem List   Diagnosis Date Noted  . Leg pain 08/12/2016  . 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 Fibrocystic changes of left breast 11/24/2015  . Chronic neck pain 06/07/2015  . GERD (gastroesophageal reflux disease) 03/08/2015  . Allergic rhinitis 03/08/2015  . Healthcare maintenance 03/08/2015  . History of cholecystectomy 03/08/2015  . Prediabetes 08/05/2014  . Hyperlipidemia 08/05/2014  . Asthma, chronic 08/04/2014  . Hypertension 08/04/2014  . Iron deficiency anemia 08/04/2014  . Obesity 08/04/2014  . History of diverticulitis 10/16/2013    Vesna Kable C. Nasiyah Laverdiere PT, DPT 11/22/16 5:37 PM   [facility] [address] [place], [place], [address] Phone: [phone]   Fax:  [phone]  Name: [name] MRN: [id number] Date of Birth: 11/27/1965

## 2016-11-30 ENCOUNTER — Ambulatory Visit: Payer: BLUE CROSS/BLUE SHIELD | Admitting: Physical Therapy

## 2016-11-30 DIAGNOSIS — R262 Difficulty in walking, not elsewhere classified: Secondary | ICD-10-CM | POA: Diagnosis not present

## 2016-11-30 DIAGNOSIS — M79605 Pain in left leg: Secondary | ICD-10-CM | POA: Diagnosis not present

## 2016-11-30 DIAGNOSIS — M79604 Pain in right leg: Secondary | ICD-10-CM

## 2016-11-30 NOTE — Therapy (Signed)
[place], [place], [address] Phone: [phone]   Fax:  [phone]  Physical Therapy Treatment  Patient Details  Name: [name] MRN: [id number] Date of Birth: Dec 02, 1965 Referring Provider: Alphonzo Grieve, MD  Encounter Date: 11/30/2016      PT End of Session - 11/30/16 1538    Visit Number 2   Number of Visits 17   Date for PT Re-Evaluation 01/21/17   Authorization Type [employer] 30 visit limit   PT Start Time 0337   PT Stop Time 0420   PT Time Calculation (min) 43 min      Past Medical History:  Diagnosis Date  . Anemia   . Asthma   . Bursitis of left hip   . Decreased appetite   . GERD (gastroesophageal reflux disease)   . History of hiatal hernia    repaired  . History of stomach ulcers   . Hypertension   . Migraine    "maybe twice in the last 10 yrs" (10/16/2013)  . Shortness of breath dyspnea     Past Surgical History:  Procedure Laterality Date  . BARTHOLIN GLAND CYST EXCISION    . BREAST LUMPECTOMY WITH RADIOACTIVE SEED LOCALIZATION Left 01/23/2016   Procedure: BREAST LUMPECTOMY WITH RADIOACTIVE SEED LOCALIZATION;  Surgeon: Jackolyn Confer, MD;  Location: [place];  Service: General;  Laterality: Left;  . CESAREAN SECTION  1989  . CHOLECYSTECTOMY  ~ 2000  . EPIGASTRIC HERNIA REPAIR  07/2003  . HERNIA REPAIR    . INCISION AND DRAINAGE ABSCESS  10/2005   Bartholin abscess.  . TONSILLECTOMY AND ADENOIDECTOMY  1983  . TUBAL LIGATION  1994    There were no vitals filed for this visit.      Subjective Assessment - 11/30/16 1539    Subjective My pain was so bad yesterday for 6 hours. If felt life a knife was stabbing me.    Currently in Pain? Yes   Pain Score 7    Pain Location Hip   Pain Orientation Right   Aggravating Factors  everything when in pain, prolonged standing.    Pain Relieving Factors whirlpool, exercise                          [facility] Adult PT  Treatment/Exercise - 11/30/16 0001      Knee/Hip Exercises: Stretches   Hip Flexor Stretch Limitations leg hang off side of table      Knee/Hip Exercises: Supine   Heel Slides Limitations pain    Bridges Limitations x10   Other Supine Knee/Hip Exercises hooklying iso ADD, with abdominal draw in , glut sets x 10    Other Supine Knee/Hip Exercises clam shell with Abdominal draw in , pelvic tilits with 5 sec holds. , bent knee raises with ab set      Modalities   Modalities Ultrasound     Ultrasound   Ultrasound Location Right proximal hip flexor    Ultrasound Parameters 100% 1.2 w/cm2, 1 mhz   Ultrasound Goals Pain     Manual Therapy   Manual Therapy Soft tissue mobilization   Soft tissue mobilization right hip flexor                   PT Short Term Goals - 11/22/16 1731      PT SHORT TERM GOAL #1   Title Pt will verbalize average concordant pain <=5/10 with daily activities by 6/15  Baseline 7/10 today- not flared up today   Time 3   Period Weeks   Status New     PT SHORT TERM GOAL #2   Title Pt will verbalize ability to maintain neutral posture and utilize stretches throughout the day to manage pain and reduce risk of rotation   Baseline began educating at eval   Time 3   Period Weeks   Status New           PT Long Term Goals - 11/22/16 1732      PT LONG TERM GOAL #1   Title Gross hip MMT 5/5 to indicate appropraite support to lumbopelvic region by surrounding soft tissue by 7/20   Baseline NT at eval due to pain and notable innominate rotation   Time 8   Period Weeks   Status New     PT LONG TERM GOAL #2   Title Pt will be able to perform work duties with pin <=3/10   Baseline severe, off the charts at eval   Time 8   Period Weeks   Status New     PT LONG TERM GOAL #3   Title FOTO to 50% ability to indicate significant improvement in functional ability   Baseline  25% ability at eval   Time 8   Period Weeks   Status New     PT LONG TERM  GOAL #4   Title Pt will be able to get into and out of cars without pain   Baseline creates compensatory patterns due to pain at eval   Time 8   Period Weeks   Status New               Plan - 11/30/16 1636    Clinical Impression Statement Pelvis appears level today. C/o right anterior hip pain. Worked on lumbopelvic mobility with good tolerance. Instructed her in a gentle hip flexor stretch. Trial of Ultrasound to right proximal hip flexor followed by IASTM to right hip flexor. After treatment patient reports 0/10 hip flexor pain and notes soreness in both quads.    PT Next Visit Plan check pelvic rotation, pelvic stability   PT Home Exercise Plan thomas test stretch, standing glut sets, iso ADD;    Consulted and Agree with Plan of Care Patient      Patient will benefit from skilled therapeutic intervention in order to improve the following deficits and impairments:  Difficulty walking, Increased muscle spasms, Decreased activity tolerance, Pain, Improper body mechanics, Impaired flexibility, Decreased strength, Postural dysfunction  Visit Diagnosis: Pain in right leg  Pain in left leg  Difficulty in walking, not elsewhere classified     Problem List Patient Active Problem List   Diagnosis Date Noted  . Leg pain 08/12/2016  . 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 Fibrocystic changes of left breast 11/24/2015  . Chronic neck pain 06/07/2015  . GERD (gastroesophageal reflux disease) 03/08/2015  . Allergic rhinitis 03/08/2015  . Healthcare maintenance 03/08/2015  . History of cholecystectomy 03/08/2015  . Prediabetes 08/05/2014  . Hyperlipidemia 08/05/2014  . Asthma, chronic 08/04/2014  . Hypertension 08/04/2014  . Iron deficiency anemia 08/04/2014  . Obesity 08/04/2014  . History of diverticulitis 10/16/2013    Dorene Ar, PTA 11/30/2016, 4:43 PM  [place], [place],  [address] Phone: [phone]   Fax:  [phone]  Name: [name] MRN: [id number] Date of Birth: 12/12/1965

## 2016-12-01 ENCOUNTER — Encounter: Payer: BLUE CROSS/BLUE SHIELD | Admitting: Internal Medicine

## 2016-12-02 ENCOUNTER — Encounter: Payer: Self-pay | Admitting: Physical Therapy

## 2016-12-02 ENCOUNTER — Ambulatory Visit: Payer: BLUE CROSS/BLUE SHIELD | Admitting: Physical Therapy

## 2016-12-02 DIAGNOSIS — R262 Difficulty in walking, not elsewhere classified: Secondary | ICD-10-CM | POA: Diagnosis not present

## 2016-12-02 DIAGNOSIS — M79604 Pain in right leg: Secondary | ICD-10-CM | POA: Diagnosis not present

## 2016-12-02 DIAGNOSIS — M79605 Pain in left leg: Secondary | ICD-10-CM | POA: Diagnosis not present

## 2016-12-02 NOTE — Therapy (Signed)
[place], [place], [address] Phone: [phone]   Fax:  [phone]  Physical Therapy Treatment  Patient Details  Name: [name] MRN: [id number] Date of Birth: 1965/11/16 Referring Provider: Alphonzo Grieve, MD  Encounter Date: 12/02/2016      PT End of Session - 12/02/16 0758    Visit Number 3   Number of Visits 17   Date for PT Re-Evaluation 01/21/17   Authorization Type [employer] 30 visit limit   PT Start Time 0800   PT Stop Time 0850   PT Time Calculation (min) 50 min   Activity Tolerance Patient tolerated treatment well   Behavior During Therapy [facility] for tasks assessed/performed      Past Medical History:  Diagnosis Date  . Anemia   . Asthma   . Bursitis of left hip   . Decreased appetite   . GERD (gastroesophageal reflux disease)   . History of hiatal hernia    repaired  . History of stomach ulcers   . Hypertension   . Migraine    "maybe twice in the last 10 yrs" (10/16/2013)  . Shortness of breath dyspnea     Past Surgical History:  Procedure Laterality Date  . BARTHOLIN GLAND CYST EXCISION    . BREAST LUMPECTOMY WITH RADIOACTIVE SEED LOCALIZATION Left 01/23/2016   Procedure: BREAST LUMPECTOMY WITH RADIOACTIVE SEED LOCALIZATION;  Surgeon: Jackolyn Confer, MD;  Location: [place];  Service: General;  Laterality: Left;  . CESAREAN SECTION  1989  . CHOLECYSTECTOMY  ~ 2000  . EPIGASTRIC HERNIA REPAIR  07/2003  . HERNIA REPAIR    . INCISION AND DRAINAGE ABSCESS  10/2005   Bartholin abscess.  . TONSILLECTOMY AND ADENOIDECTOMY  1983  . TUBAL LIGATION  1994    There were no vitals filed for this visit.      Subjective Assessment - 12/02/16 0800    Subjective My legs don't want to work this morning. Did some of the stretches and massage yesterday which was helpful. Spa got to her house yesterday. Tried doing the eliptical yesterday which was difficult. Did her walk yesterday.    Patient  Stated Goals work, get into/out of car, sleep, decrease pin   Currently in Pain? Yes   Pain Score 8    Pain Location --  Thigh   Pain Orientation Right;Left   Pain Descriptors / Indicators Sharp   Pain Frequency Constant                         [facility] Adult PT Treatment/Exercise - 12/02/16 0001      Exercises   Exercises Other Exercises   Other Exercises  prone pelvic tilt     Knee/Hip Exercises: Stretches   Passive Hamstring Stretch Limitations supine with green strap   Hip Flexor Stretch Limitations prone with strap   Gastroc Stretch Both;2 reps;30 seconds   Other Knee/Hip Stretches figure 4     Knee/Hip Exercises: Aerobic   Nustep L3 5 min     Knee/Hip Exercises: Sidelying   Hip ABduction 20 reps;Both     Knee/Hip Exercises: Prone   Hip Extension Both;15 reps   Hip Extension Limitations cues to reduce use of low back     Moist Heat Therapy   Number Minutes Moist Heat 10 Minutes   Moist Heat Location Other (comment)  bilateral thighs     Manual Therapy   Manual therapy comments trigger point release R adductors  Soft tissue mobilization roller R quads & adductors                PT Education - 12/02/16 0825    Education provided Yes   Education Details exercise form/rationale, DOMS, importance of stretching, HEP, heel strike   Person(s) Educated Patient   Methods Explanation;Demonstration;Tactile cues;Verbal cues;Handout   Comprehension Verbalized understanding;Returned demonstration;Verbal cues required;Tactile cues required;Need further instruction          PT Short Term Goals - 11/22/16 1731      PT SHORT TERM GOAL #1   Title Pt will verbalize average concordant pain <=5/10 with daily activities by 6/15   Baseline 7/10 today- not flared up today   Time 3   Period Weeks   Status New     PT SHORT TERM GOAL #2   Title Pt will verbalize ability to maintain neutral posture and utilize stretches throughout the day to manage pain  and reduce risk of rotation   Baseline began educating at eval   Time 3   Period Weeks   Status New           PT Long Term Goals - 11/22/16 1732      PT LONG TERM GOAL #1   Title Gross hip MMT 5/5 to indicate appropraite support to lumbopelvic region by surrounding soft tissue by 7/20   Baseline NT at eval due to pain and notable innominate rotation   Time 8   Period Weeks   Status New     PT LONG TERM GOAL #2   Title Pt will be able to perform work duties with pin <=3/10   Baseline severe, off the charts at eval   Time 8   Period Weeks   Status New     PT LONG TERM GOAL #3   Title FOTO to 50% ability to indicate significant improvement in functional ability   Baseline  25% ability at eval   Time 8   Period Weeks   Status New     PT LONG TERM GOAL #4   Title Pt will be able to get into and out of cars without pain   Baseline creates compensatory patterns due to pain at eval   Time 8   Period Weeks   Status New               Plan - 12/02/16 0825    Clinical Impression Statement pt with concordant pain found in knots of quads and R adductors, reduced with treatment today. pt reported feeling better. Limited heel strike and quick to raise onto her toes in gait resulting in overuse of quads   PT Treatment/Interventions ADLs/Self Care Home Management;Cryotherapy;Electrical Stimulation;Iontophoresis 4mg /ml Dexamethasone;Functional mobility training;Stair training;Gait training;Ultrasound;Traction;Moist Heat;Therapeutic activities;Therapeutic exercise;Balance training;Neuromuscular re-education;Patient/family education;Passive range of motion;Manual techniques;Dry needling;Taping   PT Next Visit Plan review heel strike, try stepper   PT Home Exercise Plan thomas test stretch, standing glut sets, iso ADD; prone quad stretch with strap, supine hamstring stretch, figure 4 stretch, gastroc stretch;    Consulted and Agree with Plan of Care Patient      Patient will  benefit from skilled therapeutic intervention in order to improve the following deficits and impairments:  Difficulty walking, Increased muscle spasms, Decreased activity tolerance, Pain, Improper body mechanics, Impaired flexibility, Decreased strength, Postural dysfunction  Visit Diagnosis: Pain in right leg  Pain in left leg  Difficulty in walking, not elsewhere classified     Problem List Patient Active Problem List  Diagnosis Date Noted  . Leg pain 08/12/2016  . 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 Fibrocystic changes of left breast 11/24/2015  . Chronic neck pain 06/07/2015  . GERD (gastroesophageal reflux disease) 03/08/2015  . Allergic rhinitis 03/08/2015  . Healthcare maintenance 03/08/2015  . History of cholecystectomy 03/08/2015  . Prediabetes 08/05/2014  . Hyperlipidemia 08/05/2014  . Asthma, chronic 08/04/2014  . Hypertension 08/04/2014  . Iron deficiency anemia 08/04/2014  . Obesity 08/04/2014  . History of diverticulitis 10/16/2013    Lissandra Keil C. Ellyce Lafevers PT, DPT 12/02/16 8:44 AM   [facility] [address] [place], [place], [address] Phone: [phone]   Fax:  [phone]  Name: [name] MRN: [id number] Date of Birth: 09/13/1965

## 2016-12-06 ENCOUNTER — Ambulatory Visit: Payer: BLUE CROSS/BLUE SHIELD | Attending: Internal Medicine | Admitting: Physical Therapy

## 2016-12-06 ENCOUNTER — Encounter: Payer: Self-pay | Admitting: Physical Therapy

## 2016-12-06 DIAGNOSIS — M79604 Pain in right leg: Secondary | ICD-10-CM | POA: Diagnosis not present

## 2016-12-06 DIAGNOSIS — R262 Difficulty in walking, not elsewhere classified: Secondary | ICD-10-CM | POA: Diagnosis not present

## 2016-12-06 DIAGNOSIS — M79605 Pain in left leg: Secondary | ICD-10-CM

## 2016-12-06 NOTE — Therapy (Signed)
[place], [place], [address] Phone: [phone]   Fax:  [phone]  Physical Therapy Treatment  Patient Details  Name: [name] MRN: [id number] Date of Birth: 04-17-1966 Referring Provider: Alphonzo Grieve, MD  Encounter Date: 12/06/2016      PT End of Session - 12/06/16 0932    Visit Number 4   Number of Visits 17   Date for PT Re-Evaluation 01/21/17   Authorization Type BCBS 30 visit limit   PT Start Time 0932   PT Stop Time 1019   PT Time Calculation (min) 47 min   Activity Tolerance Patient tolerated treatment well   Behavior During Therapy [facility] for tasks assessed/performed      Past Medical History:  Diagnosis Date  . Anemia   . Asthma   . Bursitis of left hip   . Decreased appetite   . GERD (gastroesophageal reflux disease)   . History of hiatal hernia    repaired  . History of stomach ulcers   . Hypertension   . Migraine    "maybe twice in the last 10 yrs" (10/16/2013)  . Shortness of breath dyspnea     Past Surgical History:  Procedure Laterality Date  . BARTHOLIN GLAND CYST EXCISION    . BREAST LUMPECTOMY WITH RADIOACTIVE SEED LOCALIZATION Left 01/23/2016   Procedure: BREAST LUMPECTOMY WITH RADIOACTIVE SEED LOCALIZATION;  Surgeon: Jackolyn Confer, MD;  Location: [place];  Service: General;  Laterality: Left;  . CESAREAN SECTION  1989  . CHOLECYSTECTOMY  ~ 2000  . EPIGASTRIC HERNIA REPAIR  07/2003  . HERNIA REPAIR    . INCISION AND DRAINAGE ABSCESS  10/2005   Bartholin abscess.  . TONSILLECTOMY AND ADENOIDECTOMY  1983  . TUBAL LIGATION  1994    There were no vitals filed for this visit.      Subjective Assessment - 12/06/16 0932    Subjective It is hurting so bad. Reports a sharp/pinching as she rubs bilateral thigs. Was rubbing anterior right hip at 3/4 this morning. Began having pain after working Friday, was in bed all weekend. 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     Patient Stated Goals work, get into/out of car, sleep, decrease pin   Currently in Pain? Yes   Pain Score 8    Pain Location Leg  bilateral thighs   Pain Orientation Right;Left   Pain Descriptors / Indicators Sharp;Shooting   Aggravating Factors  standing, laying flat with flare up   Pain Relieving Factors sitting and rubbing thighs                         [facility] Adult PT Treatment/Exercise - 12/06/16 0001      Knee/Hip Exercises: Stretches   Gastroc Stretch Both;2 reps;30 seconds   Other Knee/Hip Stretches prone hip flexor stretch-PT assist     Knee/Hip Exercises: Standing   Other Standing Knee Exercises PT resisted hip extension     Knee/Hip Exercises: Prone   Hamstring Curl 20 reps  both     Moist Heat Therapy   Number Minutes Moist Heat 5 Minutes  unable to tolerate further   Moist Heat Location Other (comment)  R thigh     Manual Therapy   Soft tissue mobilization R Vastus lateralis, roller R quads          Trigger Point Dry Needling - 12/06/16 0948    Consent Given? Yes   Education Handout  Provided --  verbal education              PT Education - 12/06/16 1120    Education provided Yes   Education Details exercise form/rationale, DN & expected soreness, HEP, positioning at work   [employer]) Educated Patient   Methods Explanation;Demonstration;Tactile cues;Verbal cues   Comprehension Verbalized understanding;Returned demonstration;Verbal cues required;Tactile cues required;Need further instruction          PT Short Term Goals - 11/22/16 1731      PT SHORT TERM GOAL #1   Title Pt will verbalize average concordant pain <=5/10 with daily activities by 6/15   Baseline 7/10 today- not flared up today   Time 3   Period Weeks   Status New     PT SHORT TERM GOAL #2   Title Pt will verbalize ability to maintain neutral posture and utilize stretches throughout the day to manage pain and reduce risk of rotation   Baseline began  educating at eval   Time 3   Period Weeks   Status New           PT Long Term Goals - 11/22/16 1732      PT LONG TERM GOAL #1   Title Gross hip MMT 5/5 to indicate appropraite support to lumbopelvic region by surrounding soft tissue by 7/20   Baseline NT at eval due to pain and notable innominate rotation   Time 8   Period Weeks   Status New     PT LONG TERM GOAL #2   Title Pt will be able to perform work duties with pin <=3/10   Baseline severe, off the charts at eval   Time 8   Period Weeks   Status New     PT LONG TERM GOAL #3   Title FOTO to 50% ability to indicate significant improvement in functional ability   Baseline  25% ability at eval   Time 8   Period Weeks   Status New     PT LONG TERM GOAL #4   Title Pt will be able to get into and out of cars without pain   Baseline creates compensatory patterns due to pain at eval   Time 8   Period Weeks   Status New               Plan - 12/06/16 1019    Clinical Impression Statement ended heat early to roll and stretch more due to feeling of spasm. reported feeling loose and better upon leaving. Educated on importance of maintaining a stretch on quadriceps rather than sitting, allowing them to shorten and cramp and educated on positioning/stretching options at work. I asked pt to avoid sitting as much as she could today and ride her bike a couple of times.    PT Treatment/Interventions ADLs/Self Care Home Management;Cryotherapy;Electrical Stimulation;Iontophoresis 4mg /ml Dexamethasone;Functional mobility training;Stair training;Gait training;Ultrasound;Traction;Moist Heat;Therapeutic activities;Therapeutic exercise;Balance training;Neuromuscular re-education;Patient/family education;Passive range of motion;Manual techniques;Dry needling;Taping   PT Next Visit Plan nu step, DN effectiveness?   PT Home Exercise Plan thomas test stretch, standing glut sets, iso ADD; prone quad stretch with strap, supine hamstring  stretch, figure 4 stretch, gastroc stretch;    Consulted and Agree with Plan of Care Patient      Patient will benefit from skilled therapeutic intervention in order to improve the following deficits and impairments:  Difficulty walking, Increased muscle spasms, Decreased activity tolerance, Pain, Improper body mechanics, Impaired flexibility, Decreased strength, Postural dysfunction  Visit Diagnosis: Pain in right  leg  Pain in left leg  Difficulty in walking, not elsewhere classified     Problem List Patient Active Problem List   Diagnosis Date Noted  . Leg pain 08/12/2016  . 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 Fibrocystic changes of left breast 11/24/2015  . Chronic neck pain 06/07/2015  . GERD (gastroesophageal reflux disease) 03/08/2015  . Allergic rhinitis 03/08/2015  . Healthcare maintenance 03/08/2015  . History of cholecystectomy 03/08/2015  . Prediabetes 08/05/2014  . Hyperlipidemia 08/05/2014  . Asthma, chronic 08/04/2014  . Hypertension 08/04/2014  . Iron deficiency anemia 08/04/2014  . Obesity 08/04/2014  . History of diverticulitis 10/16/2013    Aundrey Elahi C. Justus Duerr PT, DPT 12/06/16 11:21 AM   [facility] [address] [place], [place], [address] Phone: [phone]   Fax:  [phone]  Name: Martiza L Furgeson MRN: [id number] Date of Birth: 07-Jun-1966

## 2016-12-07 ENCOUNTER — Encounter: Payer: BLUE CROSS/BLUE SHIELD | Admitting: Physical Therapy

## 2016-12-09 ENCOUNTER — Ambulatory Visit: Payer: BLUE CROSS/BLUE SHIELD | Admitting: Physical Therapy

## 2016-12-09 ENCOUNTER — Encounter: Payer: Self-pay | Admitting: Physical Therapy

## 2016-12-09 DIAGNOSIS — R262 Difficulty in walking, not elsewhere classified: Secondary | ICD-10-CM

## 2016-12-09 DIAGNOSIS — M79604 Pain in right leg: Secondary | ICD-10-CM | POA: Diagnosis not present

## 2016-12-09 DIAGNOSIS — M79605 Pain in left leg: Secondary | ICD-10-CM

## 2016-12-10 NOTE — Therapy (Signed)
[place], [place], [address] Phone: [phone]   Fax:  [phone]  Physical Therapy Treatment  Patient Details  Name: [name] MRN: [id number] Date of Birth: June 29, 1966 Referring Provider: Alphonzo Grieve, MD  Encounter Date: 12/09/2016      PT End of Session - 12/09/16 1419    Visit Number 5   Number of Visits 17   Date for PT Re-Evaluation 01/21/17   Authorization Type BCBS 30 visit limit   PT Start Time 1419   PT Stop Time 1502   PT Time Calculation (min) 43 min   Activity Tolerance Patient tolerated treatment well   Behavior During Therapy [facility] for tasks assessed/performed      Past Medical History:  Diagnosis Date  . Anemia   . Asthma   . Bursitis of left hip   . Decreased appetite   . GERD (gastroesophageal reflux disease)   . History of hiatal hernia    repaired  . History of stomach ulcers   . Hypertension   . Migraine    "maybe twice in the last 10 yrs" (10/16/2013)  . Shortness of breath dyspnea     Past Surgical History:  Procedure Laterality Date  . BARTHOLIN GLAND CYST EXCISION    . BREAST LUMPECTOMY WITH RADIOACTIVE SEED LOCALIZATION Left 01/23/2016   Procedure: BREAST LUMPECTOMY WITH RADIOACTIVE SEED LOCALIZATION;  Surgeon: Jackolyn Confer, MD;  Location: [place];  Service: General;  Laterality: Left;  . CESAREAN SECTION  1989  . CHOLECYSTECTOMY  ~ 2000  . EPIGASTRIC HERNIA REPAIR  07/2003  . HERNIA REPAIR    . INCISION AND DRAINAGE ABSCESS  10/2005   Bartholin abscess.  . TONSILLECTOMY AND ADENOIDECTOMY  1983  . TUBAL LIGATION  1994    There were no vitals filed for this visit.      Subjective Assessment - 12/10/16 0750    Subjective Charlie horse in L hamstring today   Currently in Pain? Yes                           Trigger Point Dry Needling - 12/10/16 0758    Muscles Treated Lower Body Quadriceps  psoas, illiacus   Quadriceps  Response Palpable increased muscle length              PT Education - 12/10/16 0752    Education provided Yes   Education Details anatomy of condition, response to TPDN, rationale for manual therapy, cause of cramping   Person(s) Educated Patient   Methods Explanation   Comprehension Verbalized understanding          PT Short Term Goals - 11/22/16 1731      PT SHORT TERM GOAL #1   Title Pt will verbalize average concordant pain <=5/10 with daily activities by 6/15   Baseline 7/10 today- not flared up today   Time 3   Period Weeks   Status New     PT SHORT TERM GOAL #2   Title Pt will verbalize ability to maintain neutral posture and utilize stretches throughout the day to manage pain and reduce risk of rotation   Baseline began educating at eval   Time 3   Period Weeks   Status New           PT Long Term Goals - 11/22/16 1732      PT LONG TERM GOAL #1   Title Gross hip MMT 5/5 to  indicate appropraite support to lumbopelvic region by surrounding soft tissue by 7/20   Baseline NT at eval due to pain and notable innominate rotation   Time 8   Period Weeks   Status New     PT LONG TERM GOAL #2   Title Pt will be able to perform work duties with pin <=3/10   Baseline severe, off the charts at eval   Time 8   Period Weeks   Status New     PT LONG TERM GOAL #3   Title FOTO to 50% ability to indicate significant improvement in functional ability   Baseline  25% ability at eval   Time 8   Period Weeks   Status New     PT LONG TERM GOAL #4   Title Pt will be able to get into and out of cars without pain   Baseline creates compensatory patterns due to pain at eval   Time 8   Period Weeks   Status New               Plan - 12/10/16 0759    Clinical Impression Statement Pt reported feeling "much better" after treatment today. Upslip due to spasm of illiospsoas and QL creating spasm into quads and hamstrings with functional leg length discrepancy.  Discrepancy corrected today following treatent.    PT Treatment/Interventions ADLs/Self Care Home Management;Cryotherapy;Electrical Stimulation;Iontophoresis 4mg /ml Dexamethasone;Functional mobility training;Stair training;Gait training;Ultrasound;Traction;Moist Heat;Therapeutic activities;Therapeutic exercise;Balance training;Neuromuscular re-education;Patient/family education;Passive range of motion;Manual techniques;Dry needling;Taping   PT Next Visit Plan nu step, DN effectiveness?   PT Home Exercise Plan thomas test stretch, standing glut sets, iso ADD; prone quad stretch with strap, supine hamstring stretch, figure 4 stretch, gastroc stretch;    Consulted and Agree with Plan of Care Patient      Patient will benefit from skilled therapeutic intervention in order to improve the following deficits and impairments:  Difficulty walking, Increased muscle spasms, Decreased activity tolerance, Pain, Improper body mechanics, Impaired flexibility, Decreased strength, Postural dysfunction  Visit Diagnosis: Pain in right leg  Pain in left leg  Difficulty in walking, not elsewhere classified     Problem List Patient Active Problem List   Diagnosis Date Noted  . Leg pain 08/12/2016  . 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 Fibrocystic changes of left breast 11/24/2015  . Chronic neck pain 06/07/2015  . GERD (gastroesophageal reflux disease) 03/08/2015  . Allergic rhinitis 03/08/2015  . Healthcare maintenance 03/08/2015  . History of cholecystectomy 03/08/2015  . Prediabetes 08/05/2014  . Hyperlipidemia 08/05/2014  . Asthma, chronic 08/04/2014  . Hypertension 08/04/2014  . Iron deficiency anemia 08/04/2014  . Obesity 08/04/2014  . History of diverticulitis 10/16/2013   Dimples Probus C. Broden Holt PT, DPT 12/10/16 8:16 AM   [facility] [address] [place], [place], [address] Phone: [phone]   Fax:  [phone]  Name: [name] MRN: [id number] Date of Birth: 08-05-1965

## 2016-12-14 ENCOUNTER — Ambulatory Visit: Payer: BLUE CROSS/BLUE SHIELD | Admitting: Physical Therapy

## 2016-12-21 DIAGNOSIS — J45909 Unspecified asthma, uncomplicated: Secondary | ICD-10-CM | POA: Diagnosis not present

## 2016-12-21 DIAGNOSIS — I10 Essential (primary) hypertension: Secondary | ICD-10-CM | POA: Diagnosis not present

## 2016-12-21 DIAGNOSIS — R252 Cramp and spasm: Secondary | ICD-10-CM | POA: Diagnosis not present

## 2016-12-21 DIAGNOSIS — M62838 Other muscle spasm: Secondary | ICD-10-CM | POA: Diagnosis not present

## 2016-12-21 DIAGNOSIS — M25551 Pain in right hip: Secondary | ICD-10-CM | POA: Diagnosis not present

## 2016-12-21 DIAGNOSIS — G8929 Other chronic pain: Secondary | ICD-10-CM | POA: Diagnosis not present

## 2016-12-21 DIAGNOSIS — Z79899 Other long term (current) drug therapy: Secondary | ICD-10-CM | POA: Diagnosis not present

## 2016-12-21 DIAGNOSIS — Z888 Allergy status to other drugs, medicaments and biological substances status: Secondary | ICD-10-CM | POA: Diagnosis not present

## 2016-12-21 DIAGNOSIS — M79669 Pain in unspecified lower leg: Secondary | ICD-10-CM | POA: Diagnosis not present

## 2016-12-21 DIAGNOSIS — R262 Difficulty in walking, not elsewhere classified: Secondary | ICD-10-CM | POA: Diagnosis not present

## 2016-12-21 DIAGNOSIS — Z880 Allergy status to penicillin: Secondary | ICD-10-CM | POA: Diagnosis not present

## 2016-12-23 ENCOUNTER — Encounter: Payer: Self-pay | Admitting: Internal Medicine

## 2016-12-23 ENCOUNTER — Ambulatory Visit (HOSPITAL_COMMUNITY)
Admission: RE | Admit: 2016-12-23 | Discharge: 2016-12-23 | Disposition: A | Discharge status: Home / Self Care | Payer: BLUE CROSS/BLUE SHIELD | Source: Ambulatory Visit | Attending: Internal Medicine | Admitting: Internal Medicine

## 2016-12-23 ENCOUNTER — Other Ambulatory Visit: Payer: Self-pay | Admitting: Internal Medicine

## 2016-12-23 ENCOUNTER — Ambulatory Visit (INDEPENDENT_AMBULATORY_CARE_PROVIDER_SITE_OTHER): Payer: BLUE CROSS/BLUE SHIELD | Admitting: Internal Medicine

## 2016-12-23 VITALS — BP 127/83 | HR 91 | Temp 98.3°F | Ht 61.0 in | Wt 174.7 lb

## 2016-12-23 DIAGNOSIS — Z79899 Other long term (current) drug therapy: Secondary | ICD-10-CM | POA: Diagnosis not present

## 2016-12-23 DIAGNOSIS — M160 Bilateral primary osteoarthritis of hip: Secondary | ICD-10-CM | POA: Insufficient documentation

## 2016-12-23 DIAGNOSIS — I10 Essential (primary) hypertension: Secondary | ICD-10-CM | POA: Diagnosis not present

## 2016-12-23 DIAGNOSIS — M1611 Unilateral primary osteoarthritis, right hip: Secondary | ICD-10-CM | POA: Diagnosis not present

## 2016-12-23 DIAGNOSIS — M25551 Pain in right hip: Secondary | ICD-10-CM

## 2016-12-23 DIAGNOSIS — Z87891 Personal history of nicotine dependence: Secondary | ICD-10-CM

## 2016-12-23 IMAGING — CT CT ABD-PELV W/ CM
2 of 5 series · 16 of 46 positions shown, 18 images · IV contrast (Omni 300)
Comparison: CT scan of October 15, 2013.

CLINICAL DATA: Left lower quadrant abdominal pain.

EXAM:
CT ABDOMEN AND PELVIS WITH CONTRAST
TECHNIQUE: Multidetector CT imaging of the abdomen and pelvis was performed
using the standard protocol following bolus administration of
intravenous contrast.
CONTRAST:  100mL OMNIPAQUE IOHEXOL 300 MG/ML  SOLN

[Series 2: abd/ pelvis 5.0 i30f 1 · axial · 0.78mm/px · z∈[+795,+1215]mm · 13 of 94 slices shown, 15 images]
[im 5/94  soft-tissue]
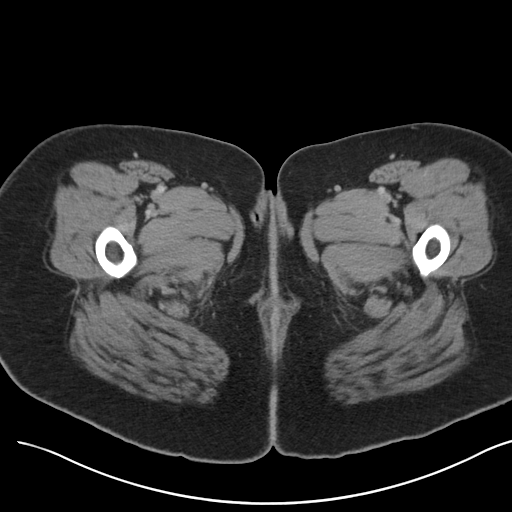
[im 5/94  bone]
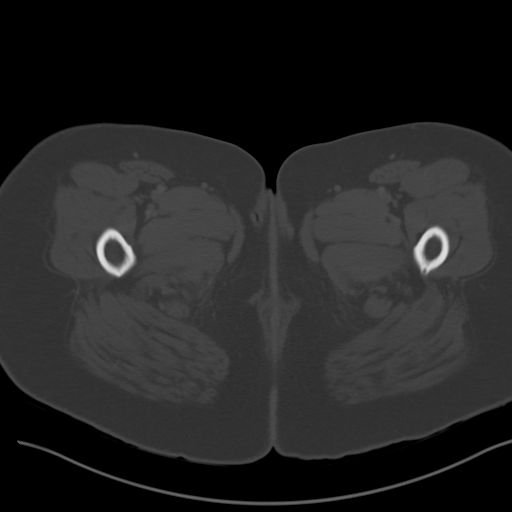
[im 14/94  soft-tissue]
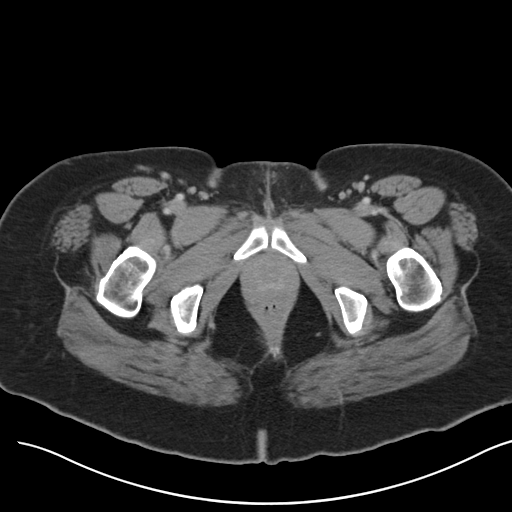
[im 19/94  soft-tissue]
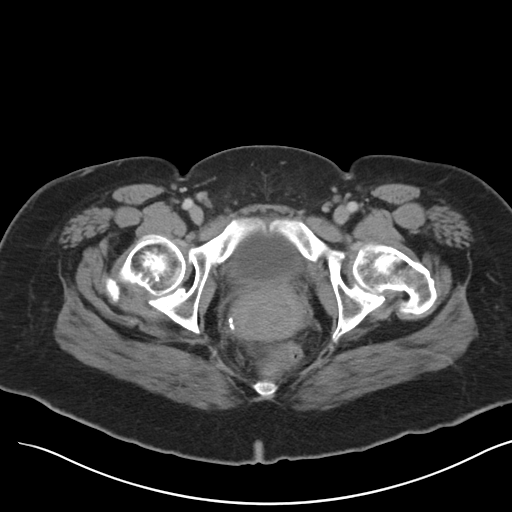
[im 28/94  soft-tissue]
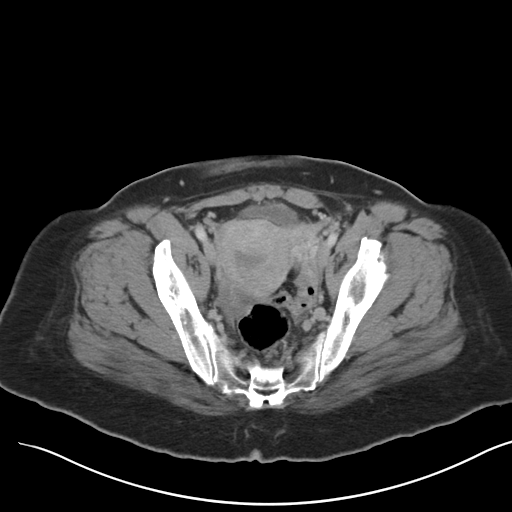
[im 33/94  soft-tissue]
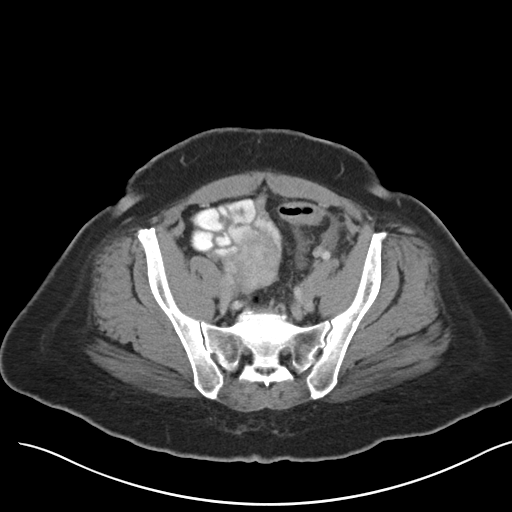
[im 42/94  soft-tissue]
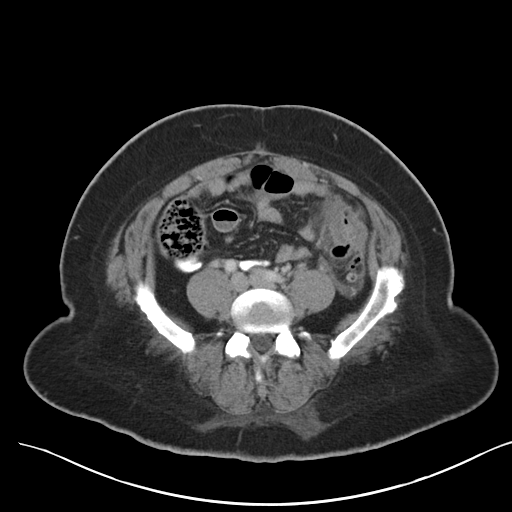
[im 47/94  soft-tissue]
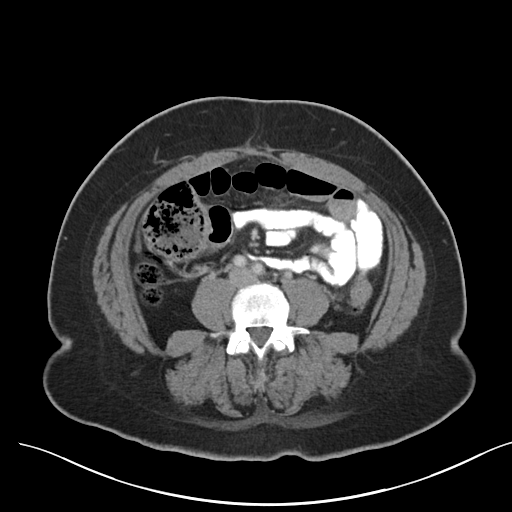
[im 52/94  soft-tissue]
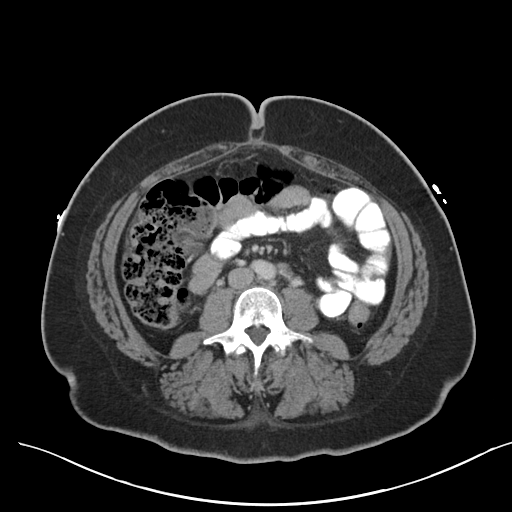
[im 61/94  soft-tissue]
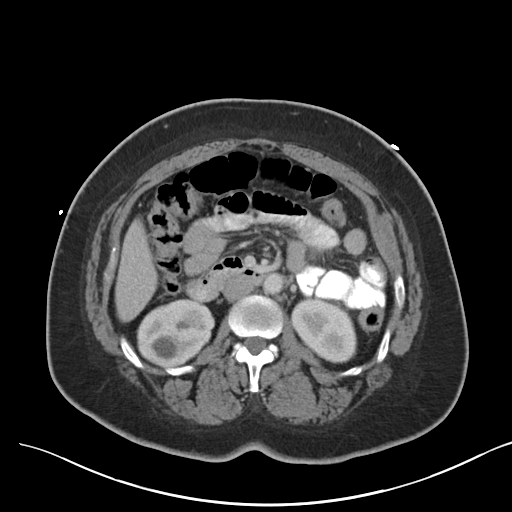
[im 61/94  bone]
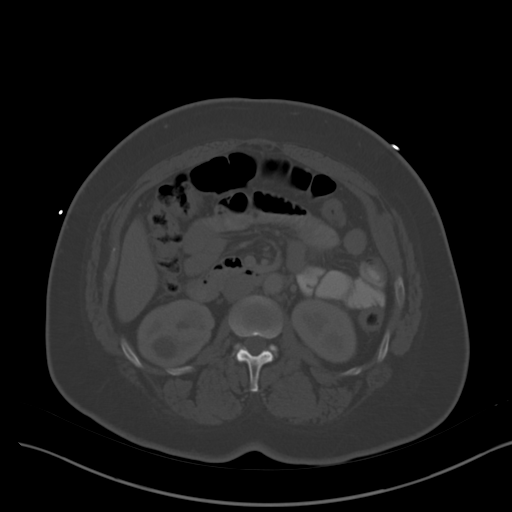
[im 66/94  soft-tissue]
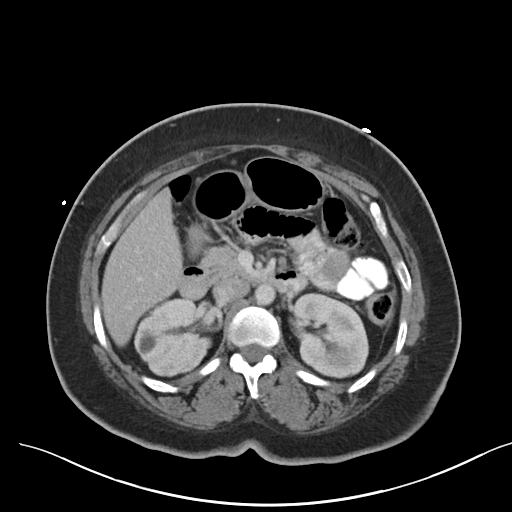
[im 75/94  soft-tissue]
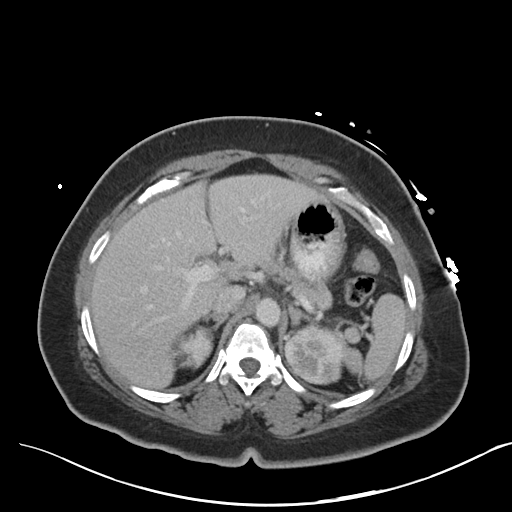
[im 80/94  soft-tissue]
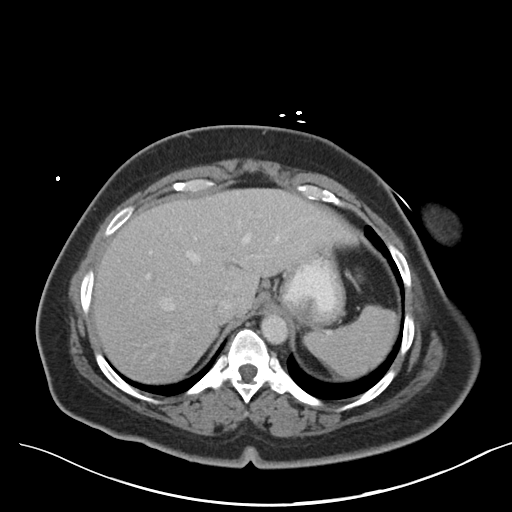
[im 89/94  soft-tissue]
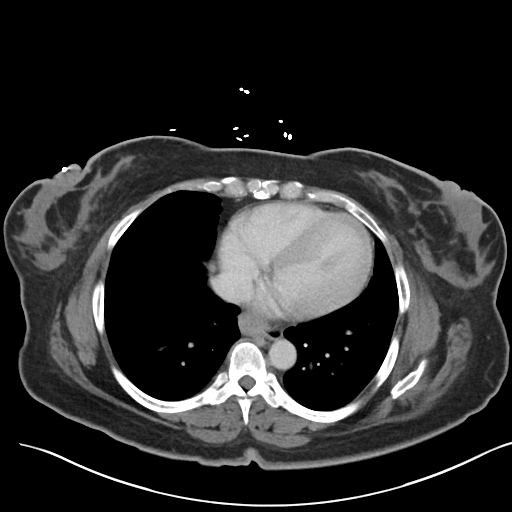

[Series 5: coronals · coronal · 0.70mm/px · 3 of 141 slices shown]
[im 47/141  soft-tissue]
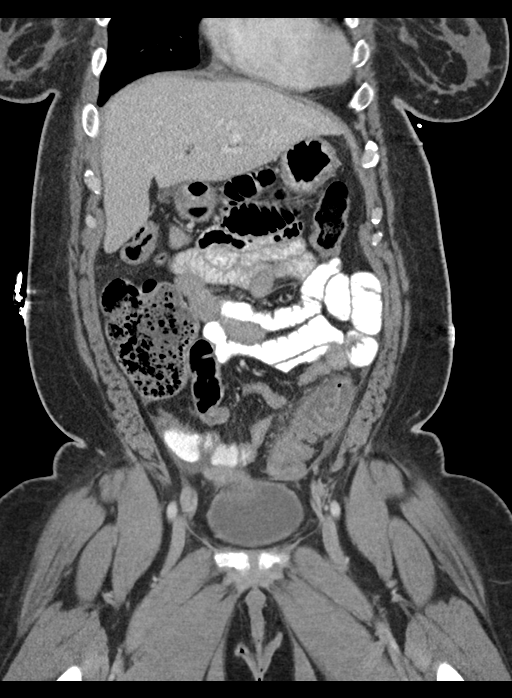
[im 63/141  soft-tissue]
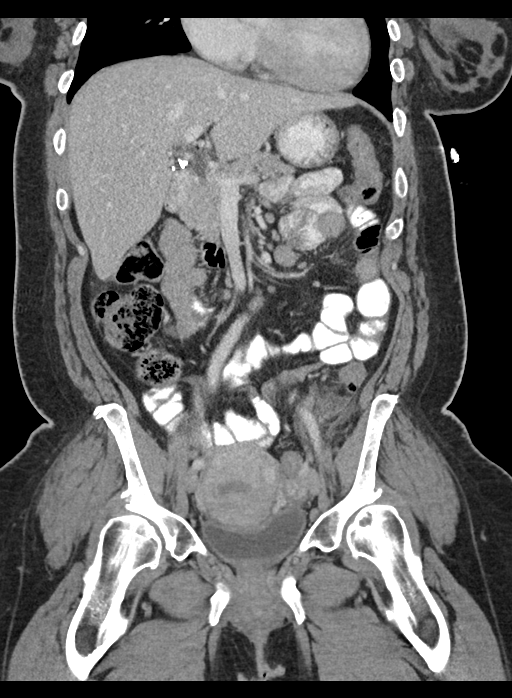
[im 78/141  soft-tissue]
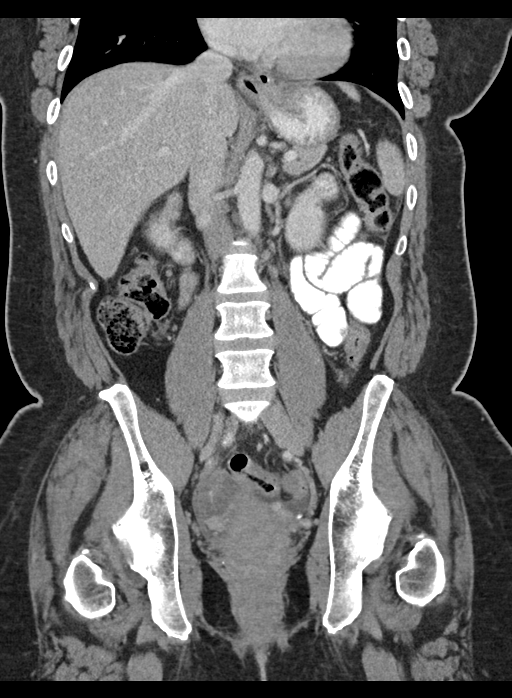

[16 of 46 positions shown; findings below may reference images not displayed]

FINDINGS: Visualized lung bases appear normal. No significant osseous
abnormality is noted.

Status post cholecystectomy. Small sliding-type hiatal hernia is
noted. The liver, spleen and pancreas appear normal. Adrenal glands
appear normal. No hydronephrosis or renal obstruction is noted.
Stable right renal cysts are noted. Cortical scarring of upper pole
of right kidney is again noted. No hydronephrosis or renal
obstruction is noted.

There is no evidence of bowel obstruction. Appendix appears normal.
Acute diverticulitis is noted at the junction of the descending and
sigmoid colon. No definite abscess is noted at this time. Uterus and
urinary bladder appear normal. No significant adenopathy is noted.
IMPRESSION: Focal acute diverticulitis is noted at the junction of the
descending and sigmoid colon. No abscess is seen at this time.

## 2016-12-23 MED ORDER — KETOROLAC TROMETHAMINE 60 MG/2ML IM SOLN
60.0000 mg | Freq: Once | INTRAMUSCULAR | Status: AC
  Administered 2016-12-23: 60 mg via INTRAMUSCULAR

## 2016-12-23 MED ORDER — TRAMADOL HCL 50 MG PO TABS: 50.0000 mg | ORAL_TABLET | Freq: Four times a day (QID) | ORAL | 0 refills | Status: DC | PRN

## 2016-12-23 NOTE — Assessment & Plan Note (Signed)
came to the clinic with worsening right hip pain for the past few days. Patient was seen at [facility] 2 days ago for the same complaint, was given a shot in her right hip with no relief. According to patient she is having 10 out of 10 pain, mostly radiating to anterior thigh, she is unable to sleep because of the pain. She is also unable to bear any weight on her right hip. She denies any erythema or swelling, no fever or chills. She has no urinary complaints.  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 She was also diagnosed with vitamin D and iron deficiency and is on replacement currently.  Repeat x-ray done today shows some degenerative changes with no fracture or femoral head collapse. Film was reviewed with Dr. Evette Doffing, no official report yet.  On exam. She was having restricted range of motion at right hip because of pain, no neurologic deficit. There was tenderness along the right groin area and along the lateral side of hip. There was not much tenderness at the greater trochanteric area.  -There was a concern of hip necrosis because of her pain and tenderness. Hip x-ray was negative for any femoral head collapse. X-rays are not very sensitive way to rule out such pathology.  -She was given a dose of Toradol 60 mg IM, which helped ease off some of her pain. -She was given a prescription for tramadol. -We ordered MRI, follow-up after MRI for further evaluation and management.

## 2016-12-23 NOTE — Progress Notes (Signed)
   CC: Worsening right hip pain for last few days.  HPI:  [name] is a 51 y.o. with past medical history of chronic bilateral leg pain came to the clinic with worsening right hip pain for the past few days. Patient was seen at [facility] 2 days ago for the same complaint, was given a shot in her right hip with no relief. According to patient she is having 10 out of 10 pain, mostly radiating to anterior thigh, she is unable to sleep because of the pain. She is also unable to bear any weight on her right hip. She denies any erythema or swelling, no fever or chills. She has no urinary complaints.  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 She was also diagnosed with vitamin D and iron deficiency and is on replacement currently.  Repeat x-ray done today shows some degenerative changes with no fracture or femoral head collapse. Film was reviewed with Dr. Evette Doffing, no official report yet.  Past Medical History:  Diagnosis Date  . Anemia   . Asthma   . Bursitis of left hip   . Decreased appetite   . GERD (gastroesophageal reflux disease)   . History of hiatal hernia    repaired  . History of stomach ulcers   . Hypertension   . Migraine    "maybe twice in the last 10 yrs" (10/16/2013)  . Shortness of breath dyspnea     Review of Systems:  As per HPI.  Physical Exam:  Vitals:   12/23/16 1430  BP: 127/83  Pulse: 91  Temp: 98.3 F (36.8 C)  TempSrc: Oral  SpO2: 100%  Weight: 174 lb 11.2 oz (79.2 kg)  Height: 5\' 1"  (1.549 m)   General: Vital signs reviewed.  Patient is well-developed and well-nourished, in  distress Because of pain and cooperative with exam.  Cardiovascular: RRR, S1 normal, S2 normal, no murmurs, gallops, or rubs. Pulmonary/Chest: Clear to auscultation bilaterally, no wheezes, rales, or  rhonchi. Abdominal: Soft, non-tender, non-distended, BS +, no masses, organomegaly, or guarding present.  Musculoskeletal: No joint deformities, erythema, or odema, Restricted ROM at right hip because of pain, no neurologic deficit. There was point tenderness at the deep right groin area and along the lateral side of hip. Extremities: No lower extremity edema bilaterally,  pulses symmetric and intact bilaterally. No cyanosis or clubbing. Skin: Warm, dry and intact. Couple of blistering on right anterior thigh because of heating pad. Psychiatric: Normal mood and affect. speech and behavior is normal. Cognition and memory are normal.  Assessment & Plan:   See Encounters Tab for problem based charting.  Patient seen with Dr. Evette Doffing

## 2016-12-23 NOTE — Patient Instructions (Signed)
Thank you for visiting clinic today. Your x-ray does not show any fracture, it looks like you're having arthritis. We will do an MRI of your right hip for further evaluation. I'm giving you a pain medicine called tramadol, you can take it every 6 hourly if needed for pain. Please follow-up with [place] after your MRI.

## 2016-12-23 NOTE — Assessment & Plan Note (Signed)
BP Readings from Last 3 Encounters:  12/23/16 127/83  11/17/16 140/79  08/11/16 (!) 163/80   She was normotensive today.  -Continue with current management.

## 2016-12-24 ENCOUNTER — Ambulatory Visit: Payer: BLUE CROSS/BLUE SHIELD | Admitting: Physical Therapy

## 2016-12-24 ENCOUNTER — Encounter: Payer: Self-pay | Admitting: Physical Therapy

## 2016-12-24 DIAGNOSIS — M79604 Pain in right leg: Secondary | ICD-10-CM

## 2016-12-24 DIAGNOSIS — R262 Difficulty in walking, not elsewhere classified: Secondary | ICD-10-CM

## 2016-12-24 DIAGNOSIS — M79605 Pain in left leg: Secondary | ICD-10-CM

## 2016-12-24 NOTE — Therapy (Signed)
[place], [place], [address] Phone: [phone]   Fax:  [phone]  Physical Therapy Treatment  Patient Details  Name: [name] MRN: [id number] Date of Birth: 1965-12-07 Referring Provider: Alphonzo Grieve, MD  Encounter Date: 12/24/2016      PT End of Session - 12/24/16 0800    Visit Number 6   Number of Visits 17   Date for PT Re-Evaluation 01/21/17   Authorization Type [employer] 30 visit limit   PT Start Time 0800   PT Stop Time 0850   PT Time Calculation (min) 50 min   Activity Tolerance Patient limited by pain   Behavior During Therapy [facility] for tasks assessed/performed      Past Medical History:  Diagnosis Date  . Anemia   . Asthma   . Bursitis of left hip   . Decreased appetite   . GERD (gastroesophageal reflux disease)   . History of hiatal hernia    repaired  . History of stomach ulcers   . Hypertension   . Migraine    "maybe twice in the last 10 yrs" (10/16/2013)  . Shortness of breath dyspnea     Past Surgical History:  Procedure Laterality Date  . BARTHOLIN GLAND CYST EXCISION    . BREAST LUMPECTOMY WITH RADIOACTIVE SEED LOCALIZATION Left 01/23/2016   Procedure: BREAST LUMPECTOMY WITH RADIOACTIVE SEED LOCALIZATION;  Surgeon: Jackolyn Confer, MD;  Location: [place];  Service: General;  Laterality: Left;  . CESAREAN SECTION  1989  . CHOLECYSTECTOMY  ~ 2000  . EPIGASTRIC HERNIA REPAIR  07/2003  . HERNIA REPAIR    . INCISION AND DRAINAGE ABSCESS  10/2005   Bartholin abscess.  . TONSILLECTOMY AND ADENOIDECTOMY  1983  . TUBAL LIGATION  1994    There were no vitals filed for this visit.      Subjective Assessment - 12/24/16 0800    Subjective Was in so much pain last tuesday that she went to the ED where they gave her an injection which helped for a couple of hours. Injection yesterday around 2:00 which helped her get up and walk.    Patient Stated Goals work, get into/out of  car, sleep, decrease pin                         [facility] Adult PT Treatment/Exercise - 12/24/16 0001      Modalities   Modalities Traction;Electrical Stimulation     Electrical Stimulation   Electrical Stimulation Location lumbar   Electrical Stimulation Action IFC   Electrical Stimulation Parameters to tolerance 15 min   Electrical Stimulation Goals Pain     Traction   Type of Traction Lumbar   Min (lbs) 15   Max (lbs) 70   Hold Time 60s   Rest Time 10s   Time 10 min                PT Education - 12/24/16 0846    Education provided Yes   Education Details rationale for traction & ESTIM, movement creating/alleviating pain, plan of care, possible outcomes from findings on MRI- surgical v conservative and combo of the two, injections aid to improve movement   Person(s) Educated Patient   Methods Explanation   Comprehension Verbalized understanding          PT Short Term Goals - 12/24/16 6378      PT SHORT TERM GOAL #1   Title Pt will verbalize average  concordant pain <=5/10 with daily activities by 6/15   Baseline Last tuesday, it was 20x worse than before   Status On-going     PT SHORT TERM GOAL #2   Title Pt will verbalize ability to maintain neutral posture and utilize stretches throughout the day to manage pain and reduce risk of rotation   Baseline inconsistent   Status On-going           PT Long Term Goals - 11/22/16 1732      PT LONG TERM GOAL #1   Title Gross hip MMT 5/5 to indicate appropraite support to lumbopelvic region by surrounding soft tissue by 7/20   Baseline NT at eval due to pain and notable innominate rotation   Time 8   Period Weeks   Status New     PT LONG TERM GOAL #2   Title Pt will be able to perform work duties with pin <=3/10   Baseline severe, off the charts at eval   Time 8   Period Weeks   Status New     PT LONG TERM GOAL #3   Title FOTO to 50% ability to indicate significant improvement in  functional ability   Baseline  25% ability at eval   Time 8   Period Weeks   Status New     PT LONG TERM GOAL #4   Title Pt will be able to get into and out of cars without pain   Baseline creates compensatory patterns due to pain at eval   Time 8   Period Weeks   Status New               Plan - 12/24/16 0820    Clinical Impression Statement Pt walked into the clinic today with slow gait pattern and verbalized high levels of pain. today feeling most of her pain in lower back (pointed to SIJ bilat). Increased concordant pain in R sidebend and extension with no change in L sidebend and flexion indicating nerve root irritation in lumbar spine. Came off of traction and reported feeling "lighter" with less notable sensation down her leg, mostly noted in bilateral anterior thighs. placed on ESTIM for pain control. I told the pt to contact us on Monday if she felt like treatment today was helpful, if not, then put PT on hold until MRI is read to determine best course of action.    PT Treatment/Interventions ADLs/Self Care Home Management;Cryotherapy;Electrical Stimulation;Iontophoresis 4mg /ml Dexamethasone;Functional mobility training;Stair training;Gait training;Ultrasound;Traction;Moist Heat;Therapeutic activities;Therapeutic exercise;Balance training;Neuromuscular re-education;Patient/family education;Passive range of motion;Manual techniques;Dry needling;Taping   PT Next Visit Plan re-evaluate   PT Home Exercise Plan thomas test stretch, standing glut sets, iso ADD; prone quad stretch with strap, supine hamstring stretch, figure 4 stretch, gastroc stretch;    Consulted and Agree with Plan of Care Patient      Patient will benefit from skilled therapeutic intervention in order to improve the following deficits and impairments:  Difficulty walking, Increased muscle spasms, Decreased activity tolerance, Pain, Improper body mechanics, Impaired flexibility, Decreased strength, Postural  dysfunction  Visit Diagnosis: Pain in right leg  Pain in left leg  Difficulty in walking, not elsewhere classified     Problem List Patient Active Problem List   Diagnosis Date Noted  . Right hip pain 12/23/2016  . Leg pain 08/12/2016  . 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 Fibrocystic changes of left breast 11/24/2015  . Chronic neck pain 06/07/2015  . GERD (gastroesophageal reflux disease) 03/08/2015  . Allergic rhinitis  03/08/2015  . Healthcare maintenance 03/08/2015  . History of cholecystectomy 03/08/2015  . Prediabetes 08/05/2014  . Hyperlipidemia 08/05/2014  . Asthma, chronic 08/04/2014  . Hypertension 08/04/2014  . Iron deficiency anemia 08/04/2014  . Obesity 08/04/2014  . History of diverticulitis 10/16/2013    Montell Leopard C. Anees Vanecek PT, DPT 12/24/16 8:47 AM   [facility] [address] [place], [place], [address] Phone: [phone]   Fax:  [phone]  Name: Willamae L Cislo MRN: [id number] Date of Birth: 19-Nov-1965

## 2016-12-24 NOTE — Progress Notes (Signed)
Internal Medicine Clinic Attending  I saw and evaluated the patient.  I personally confirmed the key portions of the history and exam documented by Dr. Reesa Chew and I reviewed pertinent patient test results.  The assessment, diagnosis, and plan were formulated together and I agree with the documentation in the resident's note.  Progressively worsening right hip pain over the last month. The pain does seem to be coming from the articular joint space. She points to her deep groin space as the source of the pain. Internal rotation of the right leg worsens the pain, and has limited range of motion of the right hip. Gait is intact, but limps due to pain. I doubt trochanteric bursitis. No back pain, and the pain only radiates to the knee, so I doubt this is radicular pain from lumbar nerve root impingement. 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 Her pain is out of proportion to the OA seen on these films. I think there is risk here for an idiopathic femoral head avascular necrosis. With normal plain films, I think we will have to do an MRI of the right hip to better rule out osteonecrosis. We will provide her with supportive care for now with Tramadol and close follow up.

## 2017-01-03 ENCOUNTER — Encounter: Payer: Self-pay | Admitting: Physical Therapy

## 2017-01-03 ENCOUNTER — Ambulatory Visit: Payer: BLUE CROSS/BLUE SHIELD | Attending: Internal Medicine | Admitting: Physical Therapy

## 2017-01-03 DIAGNOSIS — M79605 Pain in left leg: Secondary | ICD-10-CM | POA: Diagnosis not present

## 2017-01-03 DIAGNOSIS — M79604 Pain in right leg: Secondary | ICD-10-CM | POA: Insufficient documentation

## 2017-01-03 DIAGNOSIS — R262 Difficulty in walking, not elsewhere classified: Secondary | ICD-10-CM

## 2017-01-03 NOTE — Therapy (Signed)
[place], [place], [address] Phone: [phone]   Fax:  [phone]  Physical Therapy Treatment  Patient Details  Name: [name] MRN: [id number] Date of Birth: Dec 13, 1965 Referring Provider: Alphonzo Grieve, MD  Encounter Date: 01/03/2017      PT End of Session - 01/03/17 1527    Visit Number 7   Number of Visits 17   Date for PT Re-Evaluation 01/21/17   Authorization Type [employer] 30 visit limit   PT Start Time 1501   PT Stop Time 1555   PT Time Calculation (min) 54 min   Activity Tolerance Patient tolerated treatment well   Behavior During Therapy [facility] for tasks assessed/performed      Past Medical History:  Diagnosis Date  . Anemia   . Asthma   . Bursitis of left hip   . Decreased appetite   . GERD (gastroesophageal reflux disease)   . History of hiatal hernia    repaired  . History of stomach ulcers   . Hypertension   . Migraine    "maybe twice in the last 10 yrs" (10/16/2013)  . Shortness of breath dyspnea     Past Surgical History:  Procedure Laterality Date  . BARTHOLIN GLAND CYST EXCISION    . BREAST LUMPECTOMY WITH RADIOACTIVE SEED LOCALIZATION Left 01/23/2016   Procedure: BREAST LUMPECTOMY WITH RADIOACTIVE SEED LOCALIZATION;  Surgeon: Jackolyn Confer, MD;  Location: [place];  Service: General;  Laterality: Left;  . CESAREAN SECTION  1989  . CHOLECYSTECTOMY  ~ 2000  . EPIGASTRIC HERNIA REPAIR  07/2003  . HERNIA REPAIR    . INCISION AND DRAINAGE ABSCESS  10/2005   Bartholin abscess.  . TONSILLECTOMY AND ADENOIDECTOMY  1983  . TUBAL LIGATION  1994    There were no vitals filed for this visit.      Subjective Assessment - 01/03/17 1524    Subjective Pt believes she has a pinched nerve. Feels better when she stretches in a hot tub. Can feel concordant pain when she rubs the vertebrae (rubbing at L4/5). Had a bad leg cramp about 1 hour ago. Traction was helpful.    Currently in  Pain? Yes                         [facility] Adult PT Treatment/Exercise - 01/03/17 0001      Electrical Stimulation   Electrical Stimulation Location lumbar   Electrical Stimulation Action IFC   Electrical Stimulation Parameters to tolerance 15 min   Electrical Stimulation Goals Pain     Traction   Type of Traction Lumbar   Min (lbs) 20   Max (lbs) 70   Hold Time 60s   Rest Time 10s   Time 15 min                PT Education - 01/03/17 1530    Education provided Yes   Education Details "pinched nerve"   traction and move to other exercises, postural alignment, peripheralization v centralization   Person(s) Educated Patient   Methods Explanation   Comprehension Verbalized understanding;Need further instruction          PT Short Term Goals - 12/24/16 0822      PT SHORT TERM GOAL #1   Title Pt will verbalize average concordant pain <=5/10 with daily activities by 6/15   Baseline Last tuesday, it was 20x worse than before   Status On-going  PT SHORT TERM GOAL #2   Title Pt will verbalize ability to maintain neutral posture and utilize stretches throughout the day to manage pain and reduce risk of rotation   Baseline inconsistent   Status On-going           PT Long Term Goals - 11/22/16 1732      PT LONG TERM GOAL #1   Title Gross hip MMT 5/5 to indicate appropraite support to lumbopelvic region by surrounding soft tissue by 7/20   Baseline NT at eval due to pain and notable innominate rotation   Time 8   Period Weeks   Status New     PT LONG TERM GOAL #2   Title Pt will be able to perform work duties with pin <=3/10   Baseline severe, off the charts at eval   Time 8   Period Weeks   Status New     PT LONG TERM GOAL #3   Title FOTO to 50% ability to indicate significant improvement in functional ability   Baseline  25% ability at eval   Time 8   Period Weeks   Status New     PT LONG TERM GOAL #4   Title Pt will be able to get  into and out of cars without pain   Baseline creates compensatory patterns due to pain at eval   Time 8   Period Weeks   Status New               Plan - 01/03/17 1528    Clinical Impression Statement Pt pain is centralizing toward hip/low back with pain mostly on R side. We discussed causes of a "pinched nerve" as well as importance of posture to reduce strain on lumbar spine. Pt reported feeling "much better" following treatment today.    PT Treatment/Interventions ADLs/Self Care Home Management;Cryotherapy;Electrical Stimulation;Iontophoresis 4mg /ml Dexamethasone;Functional mobility training;Stair training;Gait training;Ultrasound;Traction;Moist Heat;Therapeutic activities;Therapeutic exercise;Balance training;Neuromuscular re-education;Patient/family education;Passive range of motion;Manual techniques;Dry needling;Taping   PT Next Visit Plan determine if move to flx/ext bias   PT Home Exercise Plan thomas test stretch, standing glut sets, iso ADD; prone quad stretch with strap, supine hamstring stretch, figure 4 stretch, gastroc stretch;    Consulted and Agree with Plan of Care Patient      Patient will benefit from skilled therapeutic intervention in order to improve the following deficits and impairments:  Difficulty walking, Increased muscle spasms, Decreased activity tolerance, Pain, Improper body mechanics, Impaired flexibility, Decreased strength, Postural dysfunction  Visit Diagnosis: Pain in right leg  Pain in left leg  Difficulty in walking, not elsewhere classified     Problem List Patient Active Problem List   Diagnosis Date Noted  . Right hip pain 12/23/2016  . Leg pain 08/12/2016  . 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 Fibrocystic changes of left breast 11/24/2015  . Chronic neck pain 06/07/2015  . GERD (gastroesophageal reflux disease) 03/08/2015  . Allergic rhinitis 03/08/2015  . Healthcare maintenance 03/08/2015  . History of cholecystectomy  03/08/2015  . Prediabetes 08/05/2014  . Hyperlipidemia 08/05/2014  . Asthma, chronic 08/04/2014  . Hypertension 08/04/2014  . Iron deficiency anemia 08/04/2014  . Obesity 08/04/2014  . History of diverticulitis 10/16/2013   Kalyse Meharg C. Rafeef Lau PT, DPT 01/03/17 4:44 PM   [facility] [address] [place], [place], [address] Phone: [phone]   Fax:  [phone]  Name: [name] MRN: [id number] Date of Birth: 25-Jul-1965

## 2017-01-11 ENCOUNTER — Other Ambulatory Visit: Payer: Self-pay | Admitting: *Deleted

## 2017-01-11 DIAGNOSIS — M25551 Pain in right hip: Secondary | ICD-10-CM

## 2017-01-13 ENCOUNTER — Encounter: Payer: Self-pay | Admitting: Physical Therapy

## 2017-01-13 ENCOUNTER — Ambulatory Visit: Payer: BLUE CROSS/BLUE SHIELD | Admitting: Physical Therapy

## 2017-01-13 DIAGNOSIS — R262 Difficulty in walking, not elsewhere classified: Secondary | ICD-10-CM

## 2017-01-13 DIAGNOSIS — M79605 Pain in left leg: Secondary | ICD-10-CM

## 2017-01-13 DIAGNOSIS — M79604 Pain in right leg: Secondary | ICD-10-CM

## 2017-01-13 NOTE — Therapy (Signed)
[place], [place], [address] Phone: [phone]   Fax:  [phone]  Physical Therapy Treatment  Patient Details  Name: [name] MRN: [id number] Date of Birth: Dec 22, 1965 Referring Provider: Alphonzo Grieve, MD  Encounter Date: 01/13/2017      PT End of Session - 01/13/17 0845    Visit Number 8   Number of Visits 17   Date for PT Re-Evaluation 01/21/17   Authorization Type [employer] 30 visit limit   PT Start Time 0845   PT Stop Time 0942   PT Time Calculation (min) 57 min   Activity Tolerance Patient tolerated treatment well   Behavior During Therapy [facility] for tasks assessed/performed      Past Medical History:  Diagnosis Date  . Anemia   . Asthma   . Bursitis of left hip   . Decreased appetite   . GERD (gastroesophageal reflux disease)   . History of hiatal hernia    repaired  . History of stomach ulcers   . Hypertension   . Migraine    "maybe twice in the last 10 yrs" (10/16/2013)  . Shortness of breath dyspnea     Past Surgical History:  Procedure Laterality Date  . BARTHOLIN GLAND CYST EXCISION    . BREAST LUMPECTOMY WITH RADIOACTIVE SEED LOCALIZATION Left 01/23/2016   Procedure: BREAST LUMPECTOMY WITH RADIOACTIVE SEED LOCALIZATION;  Surgeon: Jackolyn Confer, MD;  Location: [place];  Service: General;  Laterality: Left;  . CESAREAN SECTION  1989  . CHOLECYSTECTOMY  ~ 2000  . EPIGASTRIC HERNIA REPAIR  07/2003  . HERNIA REPAIR    . INCISION AND DRAINAGE ABSCESS  10/2005   Bartholin abscess.  . TONSILLECTOMY AND ADENOIDECTOMY  1983  . TUBAL LIGATION  1994    There were no vitals filed for this visit.      Subjective Assessment - 01/13/17 0845    Subjective reports traction only helped that day. has been taking a lot of medication and nothing is making it go down. is working but is in pain. pain is in R low back by SIJ, R hip (lateral). Resolution of thigh pain.    Patient Stated Goals  work, get into/out of car, sleep, decrease pin   Currently in Pain? Yes   Pain Location Hip   Pain Orientation Right;Lateral   Pain Descriptors / Indicators --  stiff                         [facility] Adult PT Treatment/Exercise - 01/13/17 0001      Moist Heat Therapy   Number Minutes Moist Heat 15 Minutes   Moist Heat Location Hip  R     Manual Therapy   Manual therapy comments manual hamstring and hip IR/ER stretch   Soft tissue mobilization IASTM R gluts & piriformis   Manual Traction long axis RLE          Trigger Point Dry Needling - 01/13/17 0930    Muscles Treated Lower Body Piriformis  glut med    Piriformis Response Twitch response elicited;Palpable increased muscle length              PT Education - 01/13/17 0930    Education provided Yes   Education Details nerves creating pain vs muscles creating pain and how they go together, centralization v peripheralization, sleeping posture, sciatica & causes/symptoms   Person(s) Educated Patient   Methods Explanation   Comprehension Verbalized understanding  PT Short Term Goals - 12/24/16 0822      PT SHORT TERM GOAL #1   Title Pt will verbalize average concordant pain <=5/10 with daily activities by 6/15   Baseline Last tuesday, it was 20x worse than before   Status On-going     PT SHORT TERM GOAL #2   Title Pt will verbalize ability to maintain neutral posture and utilize stretches throughout the day to manage pain and reduce risk of rotation   Baseline inconsistent   Status On-going           PT Long Term Goals - 11/22/16 1732      PT LONG TERM GOAL #1   Title Gross hip MMT 5/5 to indicate appropraite support to lumbopelvic region by surrounding soft tissue by 7/20   Baseline NT at eval due to pain and notable innominate rotation   Time 8   Period Weeks   Status New     PT LONG TERM GOAL #2   Title Pt will be able to perform work duties with pin <=3/10   Baseline  severe, off the charts at eval   Time 8   Period Weeks   Status New     PT LONG TERM GOAL #3   Title FOTO to 50% ability to indicate significant improvement in functional ability   Baseline  25% ability at eval   Time 8   Period Weeks   Status New     PT LONG TERM GOAL #4   Title Pt will be able to get into and out of cars without pain   Baseline creates compensatory patterns due to pain at eval   Time 8   Period Weeks   Status New               Plan - 01/13/17 9381    Clinical Impression Statement concordant pain in trigger points of piriformis and glut med today. Pt reported feeling "a lot better" after treatment. Pt is going to [place] on Monday which is about a 10 hour drive. I encouraged her to use a tennis ball for trigger point release, stretching and regular gentle movement.    PT Treatment/Interventions ADLs/Self Care Home Management;Cryotherapy;Electrical Stimulation;Iontophoresis 4mg /ml Dexamethasone;Functional mobility training;Stair training;Gait training;Ultrasound;Traction;Moist Heat;Therapeutic activities;Therapeutic exercise;Balance training;Neuromuscular re-education;Patient/family education;Passive range of motion;Manual techniques;Dry needling;Taping   PT Next Visit Plan re-eval   PT Home Exercise Plan thomas test stretch, standing glut sets, iso ADD; prone quad stretch with strap, supine hamstring stretch, figure 4 stretch, gastroc stretch;    Consulted and Agree with Plan of Care Patient      Patient will benefit from skilled therapeutic intervention in order to improve the following deficits and impairments:  Difficulty walking, Increased muscle spasms, Decreased activity tolerance, Pain, Improper body mechanics, Impaired flexibility, Decreased strength, Postural dysfunction  Visit Diagnosis: Pain in right leg  Pain in left leg  Difficulty in walking, not elsewhere classified     Problem List Patient Active Problem List   Diagnosis  Date Noted  . Right hip pain 12/23/2016  . Leg pain 08/12/2016  . 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 Fibrocystic changes of left breast 11/24/2015  . Chronic neck pain 06/07/2015  . GERD (gastroesophageal reflux disease) 03/08/2015  . Allergic rhinitis 03/08/2015  . Healthcare maintenance 03/08/2015  . History of cholecystectomy 03/08/2015  . Prediabetes 08/05/2014  . Hyperlipidemia 08/05/2014  . Asthma, chronic 08/04/2014  . Hypertension 08/04/2014  . Iron deficiency anemia 08/04/2014  .  Obesity 08/04/2014  . History of diverticulitis 10/16/2013    Jazzlyn Huizenga C. Calix Heinbaugh PT, DPT 01/13/17 9:34 AM   [facility] [address] [place], [place], [address] Phone: [phone]   Fax:  [phone]  Name: [name] MRN: [id number] Date of Birth: Aug 22, 1965

## 2017-01-14 ENCOUNTER — Ambulatory Visit (HOSPITAL_COMMUNITY): Payer: BLUE CROSS/BLUE SHIELD

## 2017-01-26 ENCOUNTER — Ambulatory Visit
Admission: RE | Admit: 2017-01-26 | Discharge: 2017-01-26 | Disposition: A | Discharge status: Home / Self Care | Payer: BLUE CROSS/BLUE SHIELD | Source: Ambulatory Visit | Attending: Internal Medicine | Admitting: Internal Medicine

## 2017-01-26 DIAGNOSIS — M25551 Pain in right hip: Secondary | ICD-10-CM

## 2017-01-26 DIAGNOSIS — M1611 Unilateral primary osteoarthritis, right hip: Secondary | ICD-10-CM | POA: Diagnosis not present

## 2017-01-26 MED ORDER — GADOBENATE DIMEGLUMINE 529 MG/ML IV SOLN
17.0000 mL | Freq: Once | INTRAVENOUS | Status: AC | PRN
  Administered 2017-01-26: 17 mL via INTRAVENOUS

## 2017-01-27 ENCOUNTER — Ambulatory Visit: Payer: BLUE CROSS/BLUE SHIELD | Admitting: Physical Therapy

## 2017-01-27 ENCOUNTER — Encounter: Payer: Self-pay | Admitting: Physical Therapy

## 2017-01-27 ENCOUNTER — Telehealth: Payer: Self-pay | Admitting: Student in an Organized Health Care Education/Training Program

## 2017-01-27 DIAGNOSIS — M79605 Pain in left leg: Secondary | ICD-10-CM

## 2017-01-27 DIAGNOSIS — M1611 Unilateral primary osteoarthritis, right hip: Secondary | ICD-10-CM

## 2017-01-27 DIAGNOSIS — R262 Difficulty in walking, not elsewhere classified: Secondary | ICD-10-CM

## 2017-01-27 DIAGNOSIS — M79604 Pain in right leg: Secondary | ICD-10-CM

## 2017-01-27 NOTE — Telephone Encounter (Signed)
I reviewed the MRI images of her right hip that we ordered on 6/21 and called the patient. She is still having function-limiting pain. Her degenerative arthritis seems advanced for her age. There was no sign of femoral head osteonecrosis. She is having some benefit with PT, but the pain overall is progressing. Plan is to refer her to orthopedics for consideration of steroid injection into the hip joint, and possibly will need to discuss hip replacement surgery. The patient understands, will call us if her symptoms change or worsen.

## 2017-01-27 NOTE — Therapy (Signed)
[place], [place], [address] Phone: [phone]   Fax:  [phone]  Physical Therapy Treatment/Discharge Summary  Patient Details  Name: [name] MRN: [id number] Date of Birth: 1965-07-14 Referring Provider: Ronny Bacon, MD  Encounter Date: 01/27/2017      PT End of Session - 01/27/17 0935    Visit Number 9   Number of Visits 17   Date for PT Re-Evaluation 01/27/17   Authorization Type BC[employer] 30 visit limit   PT Start Time 0935   PT Stop Time 1015   PT Time Calculation (min) 40 min   Activity Tolerance Patient limited by pain   Behavior During Therapy [facility] for tasks assessed/performed      Past Medical History:  Diagnosis Date  . Anemia   . Asthma   . Bursitis of left hip   . Decreased appetite   . GERD (gastroesophageal reflux disease)   . History of hiatal hernia    repaired  . History of stomach ulcers   . Hypertension   . Migraine    "maybe twice in the last 10 yrs" (10/16/2013)  . Shortness of breath dyspnea     Past Surgical History:  Procedure Laterality Date  . BARTHOLIN GLAND CYST EXCISION    . BREAST LUMPECTOMY WITH RADIOACTIVE SEED LOCALIZATION Left 01/23/2016   Procedure: BREAST LUMPECTOMY WITH RADIOACTIVE SEED LOCALIZATION;  Surgeon: Jackolyn Confer, MD;  Location: [facility];  Service: General;  Laterality: Left;  . CESAREAN SECTION  1989  . CHOLECYSTECTOMY  ~ 2000  . EPIGASTRIC HERNIA REPAIR  07/2003  . HERNIA REPAIR    . INCISION AND DRAINAGE ABSCESS  10/2005   Bartholin abscess.  . TONSILLECTOMY AND ADENOIDECTOMY  1983  . TUBAL LIGATION  1994    There were no vitals filed for this visit.      Subjective Assessment - 01/27/17 0935    Subjective Reports PT is helping a little bit. Has to use arms sometimes to lift leg into and out of car. pain focused on posterior hip joint. Difficulty taking tramadol due to stomach issues. Was in bed almost all day Tuesday after I  got back. Thinks she just needs to getting back to stretgthening   Patient Stated Goals work, get into/out of car, sleep, decrease pin   Currently in Pain? Yes   Pain Score 8    Pain Location Hip   Pain Orientation Right;Lateral   Pain Descriptors / Indicators Burning  pain   Aggravating Factors  weight bearing   Pain Relieving Factors rest            [facility] PT Assessment - 01/27/17 0001      Assessment   Medical Diagnosis pain in both lower extremities   Referring Provider Ronny Bacon, MD     Observation/Other Assessments   Focus on Therapeutic Outcomes (FOTO)  30% ability     ROM / Strength   AROM / PROM / Strength Strength     Strength   Strength Assessment Site Hip   Right/Left Hip Right   Right Hip Flexion 3-/5   Right Hip Extension 4+/5   Right Hip ABduction 3+/5                     [facility] Adult PT Treatment/Exercise - 01/27/17 0001      Exercises   Other Exercises  see exercises scanned into "patient instructions  PT Education - 01/27/17 1244    Education provided Yes   Education Details lack of progress, options for POC, HEP, exercise form/rationale, OA and reaction of surrounding musculature   Person(s) Educated Patient   Methods Explanation;Demonstration;Tactile cues;Verbal cues;Handout   Comprehension Verbalized understanding;Returned demonstration;Verbal cues required;Tactile cues required          PT Short Term Goals - 12/24/16 0822      PT SHORT TERM GOAL #1   Title Pt will verbalize average concordant pain <=5/10 with daily activities by 6/15   Baseline Last tuesday, it was 20x worse than before   Status On-going     PT SHORT TERM GOAL #2   Title Pt will verbalize ability to maintain neutral posture and utilize stretches throughout the day to manage pain and reduce risk of rotation   Baseline inconsistent   Status On-going           PT Long Term Goals - 01/27/17 0945      PT LONG TERM GOAL #1    Title Gross hip MMT 5/5 to indicate appropraite support to lumbopelvic region by surrounding soft tissue by 7/20   Baseline see flowsheet   Status Not Met     PT LONG TERM GOAL #2   Title Pt will be able to perform work duties with pin <=3/10   Baseline severe pain 10/10   Status Not Met     PT LONG TERM GOAL #3   Title FOTO to 50% ability to indicate significant improvement in functional ability   Baseline 30%   Status Not Met     PT LONG TERM GOAL #4   Title Pt will be able to get into and out of cars without pain   Baseline has to lift leg with arms   Status Not Met               Plan - 01/27/17 1015    Clinical Impression Statement Pt is being d/c from physical therapy at this time due to lack of progress toward goals. Pt was provided with HEP printout to continue stretches and exercises at home to manage pain until she sees a surgeon regarding further options for hip. Pt was agreeable and was instructed to contact me with any questions.    PT Treatment/Interventions ADLs/Self Care Home Management;Cryotherapy;Electrical Stimulation;Iontophoresis 50m/ml Dexamethasone;Functional mobility training;Stair training;Gait training;Ultrasound;Traction;Moist Heat;Therapeutic activities;Therapeutic exercise;Balance training;Neuromuscular re-education;Patient/family education;Passive range of motion;Manual techniques;Dry needling;Taping   Consulted and Agree with Plan of Care Patient      Patient will benefit from skilled therapeutic intervention in order to improve the following deficits and impairments:  Difficulty walking, Increased muscle spasms, Decreased activity tolerance, Pain, Improper body mechanics, Impaired flexibility, Decreased strength, Postural dysfunction  Visit Diagnosis: Pain in right leg - Plan: PT plan of care cert/re-cert  Pain in left leg - Plan: PT plan of care cert/re-cert  Difficulty in walking, not elsewhere classified - Plan: PT plan of care  cert/re-cert     Problem List Patient Active Problem List   Diagnosis Date Noted  . Osteoarthritis of right hip 12/23/2016  . Leg pain 08/12/2016  . 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 Fibrocystic changes of left breast 11/24/2015  . Chronic neck pain 06/07/2015  . GERD (gastroesophageal reflux disease) 03/08/2015  . Allergic rhinitis 03/08/2015  . Healthcare maintenance 03/08/2015  . History of cholecystectomy 03/08/2015  . Prediabetes 08/05/2014  . Hyperlipidemia 08/05/2014  . Asthma, chronic 08/04/2014  . Hypertension 08/04/2014  . Iron  deficiency anemia 08/04/2014  . Obesity 08/04/2014  . History of diverticulitis 10/16/2013   PHYSICAL THERAPY DISCHARGE SUMMARY  Visits from Start of Care: 9  Current functional level related to goals / functional outcomes: See above   Remaining deficits: See above   Education / Equipment: Anatomy of condition, POC, HEP, exercise form/rationae  Plan: Patient agrees to discharge.  Patient goals were not met. Patient is being discharged due to lack of progress.  ?????    Holley Wirt C. Denise Bramblett PT, DPT 01/27/17 12:53 PM    [facility] [address] [place], [place], [address] Phone: [phone]   Fax:  [phone]  Name: Ngina L Sitts MRN: [id number] Date of Birth: Apr 20, 1966

## 2017-01-31 ENCOUNTER — Ambulatory Visit: Payer: BLUE CROSS/BLUE SHIELD | Admitting: Physical Therapy

## 2017-02-02 ENCOUNTER — Encounter: Payer: BLUE CROSS/BLUE SHIELD | Admitting: Physical Therapy

## 2017-03-01 ENCOUNTER — Ambulatory Visit (INDEPENDENT_AMBULATORY_CARE_PROVIDER_SITE_OTHER): Payer: BLUE CROSS/BLUE SHIELD | Admitting: Internal Medicine

## 2017-03-01 VITALS — BP 140/65 | HR 93 | Temp 98.6°F | Ht 61.0 in | Wt 171.3 lb

## 2017-03-01 DIAGNOSIS — Z87891 Personal history of nicotine dependence: Secondary | ICD-10-CM | POA: Diagnosis not present

## 2017-03-01 DIAGNOSIS — R300 Dysuria: Secondary | ICD-10-CM | POA: Diagnosis not present

## 2017-03-01 DIAGNOSIS — R7303 Prediabetes: Secondary | ICD-10-CM

## 2017-03-01 DIAGNOSIS — I10 Essential (primary) hypertension: Secondary | ICD-10-CM | POA: Diagnosis not present

## 2017-03-01 DIAGNOSIS — Z79899 Other long term (current) drug therapy: Secondary | ICD-10-CM | POA: Diagnosis not present

## 2017-03-01 DIAGNOSIS — M1611 Unilateral primary osteoarthritis, right hip: Secondary | ICD-10-CM | POA: Diagnosis not present

## 2017-03-01 MED ORDER — OXYCODONE-ACETAMINOPHEN 10-325 MG PO TABS: 1.0000 | ORAL_TABLET | Freq: Four times a day (QID) | ORAL | 0 refills | Status: DC | PRN

## 2017-03-01 NOTE — Progress Notes (Signed)
Internal Medicine Clinic Attending  Case discussed with Dr. Reesa Chew at the time of the visit.  We reviewed the resident's history and exam and pertinent patient test results.  I agree with the assessment, diagnosis, and plan of care documented in the resident's note. I reviewed the [place] with Dr Reesa Chew, in last year she has only had 2 filled Rx one recent for tramadol and one a few months ago by surgery for Hydrocodone-APAP.  She apparently was very demanding with Dr Reesa Chew about reciving percocet, I am concerned she would not be a good candidate for opiate therapy for chronic pain.  She may be a candidate for short term therapy until surgery but we would need to see both clear benefit and estabish clear goals and boundries.  Would recommend going ahead and getting a urine Tox screen today to help assess risk as well as obtain Ortho notes before refills.

## 2017-03-01 NOTE — Patient Instructions (Signed)
Thank you for visiting the clinic today. I'm giving you Percocet- 20 tablets, you can use it up to 3-4 times a day if needed for pain. Meanwhile we will try to contact your surgeon and see if we can expedite your surgery or if they can offer you a injection in your hip to relieve that pain.

## 2017-03-01 NOTE — Progress Notes (Signed)
   CC: Right hip pain.  HPI:  [name] is a 51 y.o.lady with past medical history as listed below came to the clinic with complaint of 10 /10 right hip pain. According to patient she is unable to walk, sleep or do any work because of this continuous pain. She was seen in the clinic in June with similar symptoms and was referred to orthopedic, right hip MRI shows severe osteoarthritis with synovitis and joint effusion. Plan was to do a total hip replacement, according to patient it got delayed as surgeon wants a dental clearance now. They also asked [place] for medical clearance which was provided last week. Patient is now working with a Pharmacist, community and going to need some dental workup before surgery. According to patient she cannot tolerate pain till that time. She has tried Motrin, Aleve, tramadol and physical therapy with no relief. Patient was very agitated and demanding to give Percocet as it is the only medicine which worked for her. She used Percocet during her previous surgeries and now recently one of her friend is giving her some Percocet for this pain which do help somewhat but does not completely relieve her pain.she was even threatening to go to emergency room and if pain medicine is not provided. According to patient her pain radiates to right leg, occasionally gets mild tingling. She denies any focal weakness.  At the and while giving her AVS, she was demanding antibiotics for a presumed UTI. She told me that she knows that she had a UTI as her urine smells bad. She denies any dysuria, lower abdominal pain, increased in urgency or frequency of urine.  Past Medical History:  Diagnosis Date  . Anemia   . Asthma   . Bursitis of left hip   . Decreased appetite   . GERD (gastroesophageal reflux disease)   . History of hiatal hernia    repaired  . History of stomach ulcers   . Hypertension   . Migraine    "maybe twice in the last 10 yrs" (10/16/2013)  . Shortness of breath dyspnea     Review of Systems:  Negative except mentioned in history of present illness.  Physical Exam:  Vitals:   03/01/17 0850  BP: 140/65  Pulse: 93  Temp: 98.6 F (37 C)  TempSrc: Oral  SpO2: 100%  Weight: 171 lb 4.8 oz (77.7 kg)  Height: 5\' 1"  (1.549 m)    General: Vital signs reviewed.  Patient is well-developed and well-nourished, in no acute distress and cooperative with exam.  Cardiovascular: RRR, S1 normal, S2 normal, no murmurs, gallops, or rubs. Pulmonary/Chest: Clear to auscultation bilaterally, no wheezes, rales, or rhonchi. Abdominal: Soft, non-tender, non-distended, BS +, no masses, organomegaly, or guarding present.  Musculoskeletal: Deep right groin tenderness, severely restricted range of motion at right hip because of pain. Unable to check strength as patient was uncooperative stating that it hurts.Sensations grossly intact. Extremities: No lower extremity edema bilaterally,  pulses symmetric and intact bilaterally. No cyanosis or clubbing. Skin: Warm, dry and intact. No rashes or erythema. Psychiatric: Agitated and manipulative.  Assessment & Plan:   See Encounters Tab for problem based charting.  Patient discussed with Dr. Angelia Mould.

## 2017-03-01 NOTE — Assessment & Plan Note (Signed)
Her MRI was positive for severe osteoarthritis of right hip, plan was to have total hip arthroplasty. Her pain was very out of proportion from her imaging. She was very agitated and manipulative and continued to threaten that she will make complaints and make emergency room visits if not provided with Percocet today. She has tried NSAID and physical therapy with no relief. [place] database were checked and it was appropriate.  I discussed with patient about controlled substances. Risk and benefits of opioids. I also told her that I can provide her with only 5 days supply and will talk with her surgeon if a steroid intra-articular injection is a possibility to relieve your pain while waiting for surgery. -I provided her with a prescription for Percocet 10-325 for 20 tablets.

## 2017-03-01 NOTE — Assessment & Plan Note (Signed)
BP Readings from Last 3 Encounters:  03/01/17 140/65  12/23/16 127/83  11/17/16 140/79   Her blood pressure was elevated today. She never took her morning meds. She was also complaining of a lot of pain.  Continue with current management. We will reassess during next follow-up visit.

## 2017-03-01 NOTE — Assessment & Plan Note (Signed)
While giving her AVS she demanded a prescription for antibiotics for her presumed UTI, stating that I am telling you that I have UTI, whenever my urine smells bad that is the indication that I need antibiotics. She denied any urinary symptoms other than foul-smelling urine.  -We will check UA.

## 2017-03-01 NOTE — Addendum Note (Signed)
Addended by: Joni Reining C on: 03/01/2017 01:50 PM   Modules accepted: Orders

## 2017-03-02 LAB — MICROSCOPIC EXAMINATION
Casts: NONE SEEN /lpf
Epithelial Cells (non renal): >10 /hpf — AB (ref 0–10)

## 2017-03-02 LAB — URINALYSIS, ROUTINE W REFLEX MICROSCOPIC
Bilirubin, UA: NEGATIVE
GLUCOSE, UA: NEGATIVE
KETONES UA: NEGATIVE
NITRITE UA: NEGATIVE
SPEC GRAV UA: 1.021 (ref 1.005–1.030)
Urobilinogen, Ur: 1 mg/dL (ref 0.2–1.0)
pH, UA: 6 (ref 5.0–7.5)

## 2017-03-05 LAB — TOXASSURE SELECT,+ANTIDEPR,UR

## 2017-03-14 ENCOUNTER — Encounter: Payer: Self-pay | Admitting: Internal Medicine

## 2017-03-17 ENCOUNTER — Other Ambulatory Visit: Payer: Self-pay | Admitting: Internal Medicine

## 2017-03-17 DIAGNOSIS — Z1231 Encounter for screening mammogram for malignant neoplasm of breast: Secondary | ICD-10-CM

## 2017-03-17 NOTE — Addendum Note (Signed)
Addended by: Hulan Fray on: 03/17/2017 02:21 PM   Modules accepted: Orders

## 2017-05-23 ENCOUNTER — Ambulatory Visit: Payer: Self-pay | Admitting: Orthopedic Surgery

## 2017-05-25 ENCOUNTER — Ambulatory Visit: Payer: Self-pay | Admitting: Orthopedic Surgery

## 2017-05-25 NOTE — H&P (Signed)
TOTAL HIP ADMISSION H&P  Patient is admitted for right total hip arthroplasty.  Subjective:  Chief Complaint: right hip pain  HPI: [name], 51 y.o. female, has a history of pain and functional disability in the right hip(s) due to arthritis and patient has failed non-surgical conservative treatments for greater than 12 weeks to include NSAID's and/or analgesics, flexibility and strengthening excercises, use of assistive devices, weight reduction as appropriate and activity modification.  Onset of symptoms was gradual starting 3 years ago with gradually worsening course since that time.The patient noted no past surgery on the right hip(s).  Patient currently rates pain in the right hip at 10 out of 10 with activity. Patient has night pain, worsening of pain with activity and weight bearing, pain that interfers with activities of daily living, pain with passive range of motion and crepitus. Patient has evidence of subchondral cysts, subchondral sclerosis, periarticular osteophytes and joint space narrowing by imaging studies. This condition presents safety issues increasing the risk of falls.  There is no current active infection.  Patient Active Problem List   Diagnosis Date Noted  . Dysuria 03/01/2017  . Osteoarthritis of right hip 12/23/2016  . Leg pain 08/12/2016  . 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 Fibrocystic changes of left breast 11/24/2015  . Chronic neck pain 06/07/2015  . GERD (gastroesophageal reflux disease) 03/08/2015  . Allergic rhinitis 03/08/2015  . Healthcare maintenance 03/08/2015  . History of cholecystectomy 03/08/2015  . Prediabetes 08/05/2014  . Hyperlipidemia 08/05/2014  . Asthma, chronic 08/04/2014  . Hypertension 08/04/2014  . Iron deficiency anemia 08/04/2014  . Obesity 08/04/2014  . History of diverticulitis 10/16/2013   Past Medical History:  Diagnosis Date  . Anemia   . Asthma   . Bursitis of left hip   . Decreased appetite   . GERD  (gastroesophageal reflux disease)   . History of hiatal hernia    repaired  . History of stomach ulcers   . Hypertension   . Migraine    "maybe twice in the last 10 yrs" (10/16/2013)  . Shortness of breath dyspnea     Past Surgical History:  Procedure Laterality Date  . BARTHOLIN GLAND CYST EXCISION    . BREAST LUMPECTOMY WITH RADIOACTIVE SEED LOCALIZATION Left 01/23/2016   Procedure: BREAST LUMPECTOMY WITH RADIOACTIVE SEED LOCALIZATION;  Surgeon: Jackolyn Confer, MD;  Location: [facility];  Service: General;  Laterality: Left;  . CESAREAN SECTION  1989  . CHOLECYSTECTOMY  ~ 2000  . EPIGASTRIC HERNIA REPAIR  07/2003  . HERNIA REPAIR    . INCISION AND DRAINAGE ABSCESS  10/2005   Bartholin abscess.  . TONSILLECTOMY AND ADENOIDECTOMY  1983  . TUBAL LIGATION  1994    Current Outpatient Medications  Medication Sig Dispense Refill Last Dose  . albuterol (PROVENTIL HFA;VENTOLIN HFA) 108 (90 Base) MCG/ACT inhaler Inhale 2 puffs into the lungs every 6 (six) hours as needed for wheezing or shortness of breath. 1 Inhaler 2 Taking  . albuterol (PROVENTIL) (2.5 MG/3ML) 0.083% nebulizer solution Take 3 mLs (2.5 mg total) by nebulization every 6 (six) hours as needed for wheezing or shortness of breath. 75 mL 12 Taking  . amLODipine (NORVASC) 10 MG tablet Take 1 tablet (10 mg total) by mouth daily. 90 tablet 2 Taking  . budesonide-formoterol (SYMBICORT) 80-4.5 MCG/ACT inhaler INHALE TWO PUFFS BY MOUTH INTO LUNGS TWICE DAILY 1 Inhaler 11 Taking  . cetirizine (ZYRTEC) 10 MG tablet Take 10 mg by mouth daily.   Taking  .  cholecalciferol (VITAMIN D) 1000 units tablet Take 1 tablet (1,000 Units total) by mouth daily. 60 tablet 2 Taking  . Cyanocobalamin (VITAMIN B-12 PO) Take 1 tablet by mouth daily.   Taking  . diclofenac sodium (VOLTAREN) 1 % GEL Apply 4 g topically 4 (four) times daily. 100 g 0 Taking  . ferrous sulfate 325 (65 FE) MG tablet Take 1 tablet (325 mg total) by mouth daily. 30 tablet 3 Taking  .  fluticasone (FLONASE) 50 MCG/ACT nasal spray USE TWO SPRAY(S) IN EACH NOSTRIL ONCE DAILY 16 g 0 Taking  . magnesium hydroxide (PHILLIPS CHEWS) 311 MG CHEW chewable tablet Chew 311 mg by mouth every 4 (four) hours as needed (supplement).   Not Taking  . methocarbamol (ROBAXIN) 500 MG tablet Take 1 tablet (500 mg total) by mouth 2 (two) times daily. (Patient not taking: Reported on 11/22/2016) 20 tablet 0 Not Taking  . oxyCODONE-acetaminophen (PERCOCET) 10-325 MG tablet Take 1 tablet by mouth every 6 (six) hours as needed for pain. 20 tablet 0    No current facility-administered medications for this visit.    Allergies  Allergen Reactions  . Amoxicillin Rash  . Ampicillin Rash  . Mobic [Meloxicam] Rash  . Montelukast Sodium Rash    Social History   Tobacco Use  . Smoking status: Former Smoker    Years: 1.00    Types: Cigarettes  . Smokeless tobacco: Never Used  Substance Use Topics  . Alcohol use: Yes    Alcohol/week: 0.0 oz    Comment: 10/16/2013 "glass of wine q 6 months or so"    Family History  Problem Relation Age of Onset  . Cancer Mother        unknown  . Heart attack Father   . Heart attack Sister      Review of Systems  Constitutional: Negative.   HENT: Negative.   Eyes: Negative.   Respiratory: Positive for shortness of breath.   Cardiovascular: Positive for orthopnea.  Gastrointestinal: Negative.   Genitourinary: Negative.   Musculoskeletal: Positive for joint pain.  Skin: Negative.   Neurological: Negative.   Endo/Heme/Allergies: Negative.   Psychiatric/Behavioral: Negative.     Objective:  Physical Exam  Vitals reviewed. Constitutional: She is oriented to person, place, and time. She appears well-developed and well-nourished.  HENT:  Head: Normocephalic and atraumatic.  Eyes: Conjunctivae and EOM are normal. Pupils are equal, round, and reactive to light.  Neck: Normal range of motion.  Cardiovascular: Normal rate and intact distal pulses.   Respiratory: Effort normal. No respiratory distress.  GI: Soft. She exhibits no distension.  Genitourinary:  Genitourinary Comments: deferred  Musculoskeletal:       Right hip: She exhibits decreased range of motion, decreased strength and crepitus.  Neurological: She is alert and oriented to person, place, and time. She has normal reflexes.  Skin: Skin is warm and dry.  Psychiatric: She has a normal mood and affect. Her behavior is normal. Judgment and thought content normal.    Vital signs in last 24 hours: @VSRANGES @  Labs:   Estimated body mass index is 32.37 kg/m as calculated from the following:   Height as of 03/01/17: 5\' 1"  (1.549 m).   Weight as of 03/01/17: 77.7 kg (171 lb 4.8 oz).   Imaging Review Plain radiographs demonstrate severe degenerative joint disease of the right hip(s). The bone quality appears to be adequate for age and reported activity level.  Assessment/Plan:  End stage arthritis, right hip(s)  The patient history, physical examination,  clinical judgement of the provider and imaging studies are consistent with end stage degenerative joint disease of the right hip(s) and total hip arthroplasty is deemed medically necessary. The treatment options including medical management, injection therapy, arthroscopy and arthroplasty were discussed at length. The risks and benefits of total hip arthroplasty were presented and reviewed. The risks due to aseptic loosening, infection, stiffness, dislocation/subluxation,  thromboembolic complications and other imponderables were discussed.  The patient acknowledged the explanation, agreed to proceed with the plan and consent was signed. Patient is being admitted for inpatient treatment for surgery, pain control, PT, OT, prophylactic antibiotics, VTE prophylaxis, progressive ambulation and ADL's and discharge planning.The patient is planning to be discharged home with HEP. Has DME.

## 2017-05-25 NOTE — H&P (View-Only) (Signed)
TOTAL HIP ADMISSION H&P  Patient is admitted for right total hip arthroplasty.  Subjective:  Chief Complaint: right hip pain  HPI: [name], 51 y.o. female, has a history of pain and functional disability in the right hip(s) due to arthritis and patient has failed non-surgical conservative treatments for greater than 12 weeks to include NSAID's and/or analgesics, flexibility and strengthening excercises, use of assistive devices, weight reduction as appropriate and activity modification.  Onset of symptoms was gradual starting 3 years ago with gradually worsening course since that time.The patient noted no past surgery on the right hip(s).  Patient currently rates pain in the right hip at 10 out of 10 with activity. Patient has night pain, worsening of pain with activity and weight bearing, pain that interfers with activities of daily living, pain with passive range of motion and crepitus. Patient has evidence of subchondral cysts, subchondral sclerosis, periarticular osteophytes and joint space narrowing by imaging studies. This condition presents safety issues increasing the risk of falls.  There is no current active infection.  Patient Active Problem List   Diagnosis Date Noted  . Dysuria 03/01/2017  . Osteoarthritis of right hip 12/23/2016  . Leg pain 08/12/2016  . 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 Fibrocystic changes of left breast 11/24/2015  . Chronic neck pain 06/07/2015  . GERD (gastroesophageal reflux disease) 03/08/2015  . Allergic rhinitis 03/08/2015  . Healthcare maintenance 03/08/2015  . History of cholecystectomy 03/08/2015  . Prediabetes 08/05/2014  . Hyperlipidemia 08/05/2014  . Asthma, chronic 08/04/2014  . Hypertension 08/04/2014  . Iron deficiency anemia 08/04/2014  . Obesity 08/04/2014  . History of diverticulitis 10/16/2013   Past Medical History:  Diagnosis Date  . Anemia   . Asthma   . Bursitis of left hip   . Decreased appetite   . GERD  (gastroesophageal reflux disease)   . History of hiatal hernia    repaired  . History of stomach ulcers   . Hypertension   . Migraine    "maybe twice in the last 10 yrs" (10/16/2013)  . Shortness of breath dyspnea     Past Surgical History:  Procedure Laterality Date  . BARTHOLIN GLAND CYST EXCISION    . BREAST LUMPECTOMY WITH RADIOACTIVE SEED LOCALIZATION Left 01/23/2016   Procedure: BREAST LUMPECTOMY WITH RADIOACTIVE SEED LOCALIZATION;  Surgeon: Jackolyn Confer, MD;  Location: [facility];  Service: General;  Laterality: Left;  . CESAREAN SECTION  1989  . CHOLECYSTECTOMY  ~ 2000  . EPIGASTRIC HERNIA REPAIR  07/2003  . HERNIA REPAIR    . INCISION AND DRAINAGE ABSCESS  10/2005   Bartholin abscess.  . TONSILLECTOMY AND ADENOIDECTOMY  1983  . TUBAL LIGATION  1994    Current Outpatient Medications  Medication Sig Dispense Refill Last Dose  . albuterol (PROVENTIL HFA;VENTOLIN HFA) 108 (90 Base) MCG/ACT inhaler Inhale 2 puffs into the lungs every 6 (six) hours as needed for wheezing or shortness of breath. 1 Inhaler 2 Taking  . albuterol (PROVENTIL) (2.5 MG/3ML) 0.083% nebulizer solution Take 3 mLs (2.5 mg total) by nebulization every 6 (six) hours as needed for wheezing or shortness of breath. 75 mL 12 Taking  . amLODipine (NORVASC) 10 MG tablet Take 1 tablet (10 mg total) by mouth daily. 90 tablet 2 Taking  . budesonide-formoterol (SYMBICORT) 80-4.5 MCG/ACT inhaler INHALE TWO PUFFS BY MOUTH INTO LUNGS TWICE DAILY 1 Inhaler 11 Taking  . cetirizine (ZYRTEC) 10 MG tablet Take 10 mg by mouth daily.   Taking  .  cholecalciferol (VITAMIN D) 1000 units tablet Take 1 tablet (1,000 Units total) by mouth daily. 60 tablet 2 Taking  . Cyanocobalamin (VITAMIN B-12 PO) Take 1 tablet by mouth daily.   Taking  . diclofenac sodium (VOLTAREN) 1 % GEL Apply 4 g topically 4 (four) times daily. 100 g 0 Taking  . ferrous sulfate 325 (65 FE) MG tablet Take 1 tablet (325 mg total) by mouth daily. 30 tablet 3 Taking  .  fluticasone (FLONASE) 50 MCG/ACT nasal spray USE TWO SPRAY(S) IN EACH NOSTRIL ONCE DAILY 16 g 0 Taking  . magnesium hydroxide (PHILLIPS CHEWS) 311 MG CHEW chewable tablet Chew 311 mg by mouth every 4 (four) hours as needed (supplement).   Not Taking  . methocarbamol (ROBAXIN) 500 MG tablet Take 1 tablet (500 mg total) by mouth 2 (two) times daily. (Patient not taking: Reported on 11/22/2016) 20 tablet 0 Not Taking  . oxyCODONE-acetaminophen (PERCOCET) 10-325 MG tablet Take 1 tablet by mouth every 6 (six) hours as needed for pain. 20 tablet 0    No current facility-administered medications for this visit.    Allergies  Allergen Reactions  . Amoxicillin Rash  . Ampicillin Rash  . Mobic [Meloxicam] Rash  . Montelukast Sodium Rash    Social History   Tobacco Use  . Smoking status: Former Smoker    Years: 1.00    Types: Cigarettes  . Smokeless tobacco: Never Used  Substance Use Topics  . Alcohol use: Yes    Alcohol/week: 0.0 oz    Comment: 10/16/2013 "glass of wine q 6 months or so"    Family History  Problem Relation Age of Onset  . Cancer Mother        unknown  . Heart attack Father   . Heart attack Sister      Review of Systems  Constitutional: Negative.   HENT: Negative.   Eyes: Negative.   Respiratory: Positive for shortness of breath.   Cardiovascular: Positive for orthopnea.  Gastrointestinal: Negative.   Genitourinary: Negative.   Musculoskeletal: Positive for joint pain.  Skin: Negative.   Neurological: Negative.   Endo/Heme/Allergies: Negative.   Psychiatric/Behavioral: Negative.     Objective:  Physical Exam  Vitals reviewed. Constitutional: She is oriented to person, place, and time. She appears well-developed and well-nourished.  HENT:  Head: Normocephalic and atraumatic.  Eyes: Conjunctivae and EOM are normal. Pupils are equal, round, and reactive to light.  Neck: Normal range of motion.  Cardiovascular: Normal rate and intact distal pulses.   Respiratory: Effort normal. No respiratory distress.  GI: Soft. She exhibits no distension.  Genitourinary:  Genitourinary Comments: deferred  Musculoskeletal:       Right hip: She exhibits decreased range of motion, decreased strength and crepitus.  Neurological: She is alert and oriented to person, place, and time. She has normal reflexes.  Skin: Skin is warm and dry.  Psychiatric: She has a normal mood and affect. Her behavior is normal. Judgment and thought content normal.    Vital signs in last 24 hours: @VSRANGES @  Labs:   Estimated body mass index is 32.37 kg/m as calculated from the following:   Height as of 03/01/17: 5\' 1"  (1.549 m).   Weight as of 03/01/17: 77.7 kg (171 lb 4.8 oz).   Imaging Review Plain radiographs demonstrate severe degenerative joint disease of the right hip(s). The bone quality appears to be adequate for age and reported activity level.  Assessment/Plan:  End stage arthritis, right hip(s)  The patient history, physical examination,  clinical judgement of the provider and imaging studies are consistent with end stage degenerative joint disease of the right hip(s) and total hip arthroplasty is deemed medically necessary. The treatment options including medical management, injection therapy, arthroscopy and arthroplasty were discussed at length. The risks and benefits of total hip arthroplasty were presented and reviewed. The risks due to aseptic loosening, infection, stiffness, dislocation/subluxation,  thromboembolic complications and other imponderables were discussed.  The patient acknowledged the explanation, agreed to proceed with the plan and consent was signed. Patient is being admitted for inpatient treatment for surgery, pain control, PT, OT, prophylactic antibiotics, VTE prophylaxis, progressive ambulation and ADL's and discharge planning.The patient is planning to be discharged home with HEP. Has DME.

## 2017-05-31 NOTE — Pre-Procedure Instructions (Signed)
[name]  05/31/2017      [facility] - [place], [place] [address] Phone: [phone] Fax: [phone]  [id number] - [place], [place] [address] [place] [address] Phone: [phone] Fax: [phone]  [place], [place] - [name] [place] [address] Phone: [phone] Fax: [phone]    Your procedure is scheduled on December 3  Report to [facility] at 1230 P.M.  Call this number if you have problems the morning of surgery:  [phone]   Remember:  Do not eat food or drink liquids after midnight.  Continue all medications as directed by your physician except follow these medication instructions before surgery below   Take these medicines the morning of surgery with A SIP OF WATER  acetaminophen (TYLENOL)  albuterol (PROVENTIL HFA;VENTOLIN HFA)  albuterol (PROVENTIL) (2.5 MG/3ML) amLODipine (NORVASC) budesonide-formoterol (SYMBICORT) cetirizine (ZYRTEC)   7 days prior to surgery STOP taking any Aspirin(unless otherwise instructed by your surgeon), Aleve, Naproxen, Ibuprofen, Motrin, Advil, Goody's, BC's, all herbal medications, fish oil, and all vitamins    Do not wear jewelry, make-up or nail polish.  Do not wear lotions, powders, or perfumes, or deoderant.  Do not shave 48 hours prior to surgery.    Do not bring valuables to the hospital.  [facility] is not responsible for any belongings or valuables.  Contacts, dentures or bridgework may not be worn into surgery.  Leave your suitcase in the car.  After surgery it may be brought to your room.  For patients admitted to the hospital, discharge time will be determined by your treatment team.  Patients discharged the day of surgery will not be allowed  to drive home.    Special instructions:   [facility]- Preparing For Surgery  Before surgery, you can play an important role. Because skin is not sterile, your skin needs to be as free of germs as possible. You can reduce the number of germs on your skin by washing with CHG (chlorahexidine gluconate) Soap before surgery.  CHG is an antiseptic cleaner which kills germs and bonds with the skin to continue killing germs even after washing.  Please do not use if you have an allergy to CHG or antibacterial soaps. If your skin becomes reddened/irritated stop using the CHG.  Do not shave (including legs and underarms) for at least 48 hours prior to first CHG shower. It is OK to shave your face.  Please follow these instructions carefully.   1. Shower the NIGHT BEFORE SURGERY and the MORNING OF SURGERY with CHG.   2. If you chose to wash your hair, wash your hair first as usual with your normal shampoo.  3. After you shampoo, rinse your hair and body thoroughly to remove the shampoo.  4. Use CHG as you would any other liquid soap. You can apply CHG directly to the skin and wash gently with a scrungie or a clean washcloth.   5. Apply the CHG Soap to your body ONLY FROM THE NECK DOWN.  Do not use on open wounds or open sores. Avoid contact with your eyes, ears, mouth and genitals (private parts). Wash Face and genitals (private parts)  with your normal soap.  6. Wash thoroughly, paying special attention to the area where your surgery will be performed.  7. Thoroughly rinse your body with warm water from the neck down.  8. DO NOT shower/wash with your normal soap after using and rinsing off the CHG Soap.  9. Pat yourself dry with a CLEAN TOWEL.  10. Wear CLEAN PAJAMAS to bed the night before surgery, wear comfortable clothes the morning of surgery  11. Place CLEAN SHEETS on your bed the night of your first shower and DO NOT SLEEP WITH PETS.    Day of Surgery: Do not apply any  deodorants/lotions. Please wear clean clothes to the hospital/surgery center.      Please read over the following fact sheets that you were given.

## 2017-06-01 ENCOUNTER — Encounter (HOSPITAL_COMMUNITY): Payer: Self-pay

## 2017-06-01 ENCOUNTER — Encounter (HOSPITAL_COMMUNITY)
Admission: RE | Admit: 2017-06-01 | Discharge: 2017-06-01 | Disposition: A | Discharge status: Home / Self Care | Payer: No Typology Code available for payment source | Source: Ambulatory Visit | Attending: Orthopedic Surgery | Admitting: Orthopedic Surgery

## 2017-06-01 ENCOUNTER — Other Ambulatory Visit: Payer: Self-pay

## 2017-06-01 DIAGNOSIS — Z01818 Encounter for other preprocedural examination: Secondary | ICD-10-CM | POA: Diagnosis present

## 2017-06-01 DIAGNOSIS — R079 Chest pain, unspecified: Secondary | ICD-10-CM | POA: Insufficient documentation

## 2017-06-01 DIAGNOSIS — R9431 Abnormal electrocardiogram [ECG] [EKG]: Secondary | ICD-10-CM | POA: Diagnosis not present

## 2017-06-01 HISTORY — DX: Nausea with vomiting, unspecified: R11.2

## 2017-06-01 HISTORY — DX: Nausea with vomiting, unspecified: Z98.890

## 2017-06-01 HISTORY — DX: Other specified postprocedural states: Z98.890

## 2017-06-01 LAB — CBC
HCT: 34.5 % — ABNORMAL LOW (ref 36.0–46.0)
Hemoglobin: 11.5 g/dL — ABNORMAL LOW (ref 12.0–15.0)
MCH: 27.9 pg (ref 26.0–34.0)
MCHC: 33.3 g/dL (ref 30.0–36.0)
MCV: 83.7 fL (ref 78.0–100.0)
PLATELETS: 220 10*3/uL (ref 150–400)
RBC: 4.12 MIL/uL (ref 3.87–5.11)
RDW: 15.8 % — AB (ref 11.5–15.5)
WBC: 5.7 10*3/uL (ref 4.0–10.5)

## 2017-06-01 LAB — BASIC METABOLIC PANEL
Anion gap: 6 (ref 5–15)
BUN: 10 mg/dL (ref 6–20)
CALCIUM: 9.4 mg/dL (ref 8.9–10.3)
CHLORIDE: 105 mmol/L (ref 101–111)
CO2: 24 mmol/L (ref 22–32)
CREATININE: 0.51 mg/dL (ref 0.44–1.00)
GFR calc non Af Amer: >60 mL/min (ref >60)
Glucose, Bld: 132 mg/dL — ABNORMAL HIGH (ref 65–99)
Potassium: 3.6 mmol/L (ref 3.5–5.1)
Sodium: 135 mmol/L (ref 135–145)

## 2017-06-01 LAB — ABO/RH: ABO/RH(D): B POS

## 2017-06-01 LAB — TYPE AND SCREEN
ABO/RH(D): B POS
Antibody Screen: NEGATIVE

## 2017-06-01 LAB — HEMOGLOBIN A1C
HEMOGLOBIN A1C: 5.7 % — AB (ref 4.8–5.6)
Mean Plasma Glucose: 116.89 mg/dL

## 2017-06-01 LAB — SURGICAL PCR SCREEN
MRSA, PCR: NEGATIVE
Staphylococcus aureus: NEGATIVE

## 2017-06-01 LAB — HCG, SERUM, QUALITATIVE: PREG SERUM: NEGATIVE

## 2017-06-01 NOTE — Progress Notes (Signed)
PCP - Lorella Nimrod Cardiologist - denies  Chest x-ray - not needed EKG - 06/01/17 Stress Test - denies ECHO - denies Cardiac Cath - denies  Sleep Study -  CPAP -   Fasting Blood Sugar - doesn't check sugar - pre diabetic according to PCP note Anesthesia review: NO  Patient denies shortness of breath, fever, cough and chest pain at PAT appointment   Patient verbalized understanding of instructions that were given to them at the PAT appointment. Patient was also instructed that they will need to review over the PAT instructions again at home before surgery.

## 2017-06-03 MED ORDER — CLINDAMYCIN PHOSPHATE 900 MG/50ML IV SOLN
900.0000 mg | INTRAVENOUS | Status: AC
  Administered 2017-06-06: 900 mg via INTRAVENOUS
  Filled 2017-06-03: qty 50

## 2017-06-03 MED ORDER — ACETAMINOPHEN 10 MG/ML IV SOLN
1000.0000 mg | INTRAVENOUS | Status: AC
  Administered 2017-06-06: 1000 mg via INTRAVENOUS
  Filled 2017-06-03: qty 100

## 2017-06-03 MED ORDER — TRANEXAMIC ACID 1000 MG/10ML IV SOLN
1000.0000 mg | INTRAVENOUS | Status: AC
  Administered 2017-06-06: 1000 mg via INTRAVENOUS
  Filled 2017-06-03: qty 1100

## 2017-06-05 HISTORY — PX: TOTAL HIP ARTHROPLASTY: SHX124

## 2017-06-06 ENCOUNTER — Inpatient Hospital Stay (HOSPITAL_COMMUNITY)
Admission: RE | Admit: 2017-06-06 | Discharge: 2017-06-09 | DRG: 470 | Disposition: A | Discharge status: Home Health | Payer: No Typology Code available for payment source | Source: Ambulatory Visit | Attending: Orthopedic Surgery | Admitting: Orthopedic Surgery

## 2017-06-06 ENCOUNTER — Inpatient Hospital Stay (HOSPITAL_COMMUNITY): Payer: No Typology Code available for payment source

## 2017-06-06 ENCOUNTER — Encounter (HOSPITAL_COMMUNITY)
Admission: RE | Disposition: A | Discharge status: Home Health | Payer: Self-pay | Source: Ambulatory Visit | Attending: Orthopedic Surgery

## 2017-06-06 ENCOUNTER — Inpatient Hospital Stay (HOSPITAL_COMMUNITY): Payer: No Typology Code available for payment source | Admitting: Anesthesiology

## 2017-06-06 ENCOUNTER — Encounter (HOSPITAL_COMMUNITY): Payer: Self-pay | Admitting: *Deleted

## 2017-06-06 DIAGNOSIS — Z8249 Family history of ischemic heart disease and other diseases of the circulatory system: Secondary | ICD-10-CM | POA: Diagnosis not present

## 2017-06-06 DIAGNOSIS — Z87891 Personal history of nicotine dependence: Secondary | ICD-10-CM

## 2017-06-06 DIAGNOSIS — I361 Nonrheumatic tricuspid (valve) insufficiency: Secondary | ICD-10-CM | POA: Diagnosis not present

## 2017-06-06 DIAGNOSIS — I10 Essential (primary) hypertension: Secondary | ICD-10-CM | POA: Diagnosis present

## 2017-06-06 DIAGNOSIS — Z419 Encounter for procedure for purposes other than remedying health state, unspecified: Secondary | ICD-10-CM

## 2017-06-06 DIAGNOSIS — N920 Excessive and frequent menstruation with regular cycle: Secondary | ICD-10-CM | POA: Diagnosis not present

## 2017-06-06 DIAGNOSIS — Z9049 Acquired absence of other specified parts of digestive tract: Secondary | ICD-10-CM

## 2017-06-06 DIAGNOSIS — M1611 Unilateral primary osteoarthritis, right hip: Secondary | ICD-10-CM | POA: Diagnosis present

## 2017-06-06 DIAGNOSIS — R0789 Other chest pain: Secondary | ICD-10-CM | POA: Diagnosis not present

## 2017-06-06 DIAGNOSIS — J45909 Unspecified asthma, uncomplicated: Secondary | ICD-10-CM | POA: Diagnosis present

## 2017-06-06 DIAGNOSIS — Z833 Family history of diabetes mellitus: Secondary | ICD-10-CM

## 2017-06-06 DIAGNOSIS — Z09 Encounter for follow-up examination after completed treatment for conditions other than malignant neoplasm: Secondary | ICD-10-CM

## 2017-06-06 DIAGNOSIS — K219 Gastro-esophageal reflux disease without esophagitis: Secondary | ICD-10-CM | POA: Diagnosis present

## 2017-06-06 DIAGNOSIS — I447 Left bundle-branch block, unspecified: Secondary | ICD-10-CM | POA: Diagnosis not present

## 2017-06-06 DIAGNOSIS — D62 Acute posthemorrhagic anemia: Secondary | ICD-10-CM | POA: Diagnosis not present

## 2017-06-06 DIAGNOSIS — K5900 Constipation, unspecified: Secondary | ICD-10-CM | POA: Diagnosis not present

## 2017-06-06 DIAGNOSIS — Z886 Allergy status to analgesic agent status: Secondary | ICD-10-CM | POA: Diagnosis not present

## 2017-06-06 DIAGNOSIS — Z96641 Presence of right artificial hip joint: Secondary | ICD-10-CM | POA: Diagnosis not present

## 2017-06-06 DIAGNOSIS — K449 Diaphragmatic hernia without obstruction or gangrene: Secondary | ICD-10-CM | POA: Diagnosis not present

## 2017-06-06 DIAGNOSIS — M25551 Pain in right hip: Secondary | ICD-10-CM | POA: Diagnosis present

## 2017-06-06 DIAGNOSIS — Z7951 Long term (current) use of inhaled steroids: Secondary | ICD-10-CM

## 2017-06-06 DIAGNOSIS — D509 Iron deficiency anemia, unspecified: Secondary | ICD-10-CM | POA: Diagnosis present

## 2017-06-06 DIAGNOSIS — Z79899 Other long term (current) drug therapy: Secondary | ICD-10-CM | POA: Diagnosis not present

## 2017-06-06 DIAGNOSIS — K5792 Diverticulitis of intestine, part unspecified, without perforation or abscess without bleeding: Secondary | ICD-10-CM | POA: Diagnosis not present

## 2017-06-06 DIAGNOSIS — D500 Iron deficiency anemia secondary to blood loss (chronic): Secondary | ICD-10-CM | POA: Diagnosis not present

## 2017-06-06 DIAGNOSIS — Z950 Presence of cardiac pacemaker: Secondary | ICD-10-CM | POA: Diagnosis not present

## 2017-06-06 HISTORY — PX: TOTAL HIP ARTHROPLASTY: SHX124

## 2017-06-06 LAB — TROPONIN I

## 2017-06-06 SURGERY — ARTHROPLASTY, HIP, TOTAL, ANTERIOR APPROACH
Anesthesia: Spinal | Site: Hip | Laterality: Right

## 2017-06-06 MED ORDER — DEXAMETHASONE SODIUM PHOSPHATE 10 MG/ML IJ SOLN
INTRAMUSCULAR | Status: AC
  Filled 2017-06-06: qty 1

## 2017-06-06 MED ORDER — HYDROMORPHONE HCL 1 MG/ML IJ SOLN
INTRAMUSCULAR | Status: AC
  Filled 2017-06-06: qty 1

## 2017-06-06 MED ORDER — MENTHOL 3 MG MT LOZG
1.0000 | LOZENGE | OROMUCOSAL | Status: DC | PRN
  Filled 2017-06-06: qty 9

## 2017-06-06 MED ORDER — AMLODIPINE BESYLATE 10 MG PO TABS
10.0000 mg | ORAL_TABLET | Freq: Every day | ORAL | Status: DC
  Administered 2017-06-07 – 2017-06-09 (×3): 10 mg via ORAL
  Filled 2017-06-06 (×3): qty 1

## 2017-06-06 MED ORDER — METOCLOPRAMIDE HCL 5 MG PO TABS: 5.0000 mg | ORAL_TABLET | Freq: Three times a day (TID) | ORAL | Status: DC | PRN

## 2017-06-06 MED ORDER — PROPOFOL 10 MG/ML IV BOLUS
INTRAVENOUS | Status: DC | PRN
  Administered 2017-06-06: 15 mg via INTRAVENOUS
  Administered 2017-06-06: 10 mg via INTRAVENOUS
  Administered 2017-06-06: 15 mg via INTRAVENOUS

## 2017-06-06 MED ORDER — ONDANSETRON HCL 4 MG/2ML IJ SOLN
INTRAMUSCULAR | Status: AC
  Filled 2017-06-06: qty 2

## 2017-06-06 MED ORDER — PHENYLEPHRINE 40 MCG/ML (10ML) SYRINGE FOR IV PUSH (FOR BLOOD PRESSURE SUPPORT)
PREFILLED_SYRINGE | INTRAVENOUS | Status: AC
  Filled 2017-06-06: qty 10

## 2017-06-06 MED ORDER — SENNA 8.6 MG PO TABS
2.0000 | ORAL_TABLET | Freq: Every day | ORAL | Status: DC
  Administered 2017-06-07 – 2017-06-08 (×2): 17.2 mg via ORAL
  Filled 2017-06-06 (×2): qty 2

## 2017-06-06 MED ORDER — LORATADINE 10 MG PO TABS
10.0000 mg | ORAL_TABLET | Freq: Every day | ORAL | Status: DC
  Administered 2017-06-07 – 2017-06-09 (×3): 10 mg via ORAL
  Filled 2017-06-06 (×3): qty 1

## 2017-06-06 MED ORDER — FENTANYL CITRATE (PF) 250 MCG/5ML IJ SOLN
INTRAMUSCULAR | Status: AC
  Filled 2017-06-06: qty 5

## 2017-06-06 MED ORDER — LIDOCAINE 2% (20 MG/ML) 5 ML SYRINGE
INTRAMUSCULAR | Status: AC
  Filled 2017-06-06: qty 15

## 2017-06-06 MED ORDER — POLYETHYLENE GLYCOL 3350 17 G PO PACK
17.0000 g | PACK | Freq: Every day | ORAL | Status: DC | PRN
  Administered 2017-06-08: 17 g via ORAL
  Filled 2017-06-06: qty 1

## 2017-06-06 MED ORDER — ONDANSETRON HCL 4 MG/2ML IJ SOLN
4.0000 mg | Freq: Four times a day (QID) | INTRAMUSCULAR | Status: DC | PRN
  Administered 2017-06-07 – 2017-06-08 (×3): 4 mg via INTRAVENOUS
  Filled 2017-06-06 (×3): qty 2

## 2017-06-06 MED ORDER — DEXAMETHASONE SODIUM PHOSPHATE 10 MG/ML IJ SOLN
10.0000 mg | Freq: Once | INTRAMUSCULAR | Status: AC
  Administered 2017-06-07: 10 mg via INTRAVENOUS
  Filled 2017-06-06: qty 1

## 2017-06-06 MED ORDER — IPRATROPIUM-ALBUTEROL 0.5-2.5 (3) MG/3ML IN SOLN
3.0000 mL | RESPIRATORY_TRACT | Status: DC | PRN
  Administered 2017-06-06 – 2017-06-09 (×4): 3 mL via RESPIRATORY_TRACT
  Filled 2017-06-06 (×4): qty 3

## 2017-06-06 MED ORDER — HYDROMORPHONE HCL 1 MG/ML IJ SOLN: 1.0000 mg | Freq: Once | INTRAMUSCULAR | Status: DC

## 2017-06-06 MED ORDER — SODIUM CHLORIDE 0.9 % IV SOLN
INTRAVENOUS | Status: DC
  Administered 2017-06-06: 23:00:00 via INTRAVENOUS

## 2017-06-06 MED ORDER — MIDAZOLAM HCL 5 MG/5ML IJ SOLN
INTRAMUSCULAR | Status: DC | PRN
  Administered 2017-06-06: 2 mg via INTRAVENOUS

## 2017-06-06 MED ORDER — ALUM & MAG HYDROXIDE-SIMETH 200-200-20 MG/5ML PO SUSP
30.0000 mL | ORAL | Status: DC | PRN
  Administered 2017-06-07 (×2): 30 mL via ORAL
  Filled 2017-06-06 (×2): qty 30

## 2017-06-06 MED ORDER — SODIUM CHLORIDE 0.9 % IJ SOLN
INTRAMUSCULAR | Status: DC | PRN
  Administered 2017-06-06: 30 mL

## 2017-06-06 MED ORDER — CLINDAMYCIN PHOSPHATE 600 MG/50ML IV SOLN
600.0000 mg | Freq: Three times a day (TID) | INTRAVENOUS | Status: AC
  Administered 2017-06-06 – 2017-06-07 (×2): 600 mg via INTRAVENOUS
  Filled 2017-06-06 (×2): qty 50

## 2017-06-06 MED ORDER — ACETAMINOPHEN 650 MG RE SUPP: 650.0000 mg | RECTAL | Status: DC | PRN

## 2017-06-06 MED ORDER — ONDANSETRON HCL 4 MG/2ML IJ SOLN
INTRAMUSCULAR | Status: DC | PRN
  Administered 2017-06-06: 4 mg via INTRAVENOUS

## 2017-06-06 MED ORDER — ASPIRIN 81 MG PO CHEW
81.0000 mg | CHEWABLE_TABLET | Freq: Two times a day (BID) | ORAL | Status: DC
  Administered 2017-06-06 – 2017-06-09 (×6): 81 mg via ORAL
  Filled 2017-06-06 (×6): qty 1

## 2017-06-06 MED ORDER — BUPIVACAINE IN DEXTROSE 0.75-8.25 % IT SOLN: INTRATHECAL | Status: DC | PRN

## 2017-06-06 MED ORDER — LACTATED RINGERS IV SOLN
INTRAVENOUS | Status: DC
  Administered 2017-06-06 (×2): via INTRAVENOUS

## 2017-06-06 MED ORDER — PHENYLEPHRINE HCL 10 MG/ML IJ SOLN
INTRAMUSCULAR | Status: DC | PRN
  Administered 2017-06-06: 20 ug/min via INTRAVENOUS

## 2017-06-06 MED ORDER — MIDAZOLAM HCL 2 MG/2ML IJ SOLN
INTRAMUSCULAR | Status: AC
Start: 2017-06-06 — End: ?
  Filled 2017-06-06: qty 2

## 2017-06-06 MED ORDER — BUPIVACAINE-EPINEPHRINE (PF) 0.25% -1:200000 IJ SOLN
INTRAMUSCULAR | Status: AC
  Filled 2017-06-06: qty 30

## 2017-06-06 MED ORDER — MOMETASONE FURO-FORMOTEROL FUM 100-5 MCG/ACT IN AERO
2.0000 | INHALATION_SPRAY | Freq: Two times a day (BID) | RESPIRATORY_TRACT | Status: DC
  Administered 2017-06-07 – 2017-06-09 (×5): 2 via RESPIRATORY_TRACT
  Filled 2017-06-06: qty 8.8

## 2017-06-06 MED ORDER — BUPIVACAINE-EPINEPHRINE 0.25% -1:200000 IJ SOLN
INTRAMUSCULAR | Status: DC | PRN
  Administered 2017-06-06: 30 mL

## 2017-06-06 MED ORDER — HYDROCODONE-ACETAMINOPHEN 5-325 MG PO TABS
2.0000 | ORAL_TABLET | ORAL | Status: DC | PRN
  Administered 2017-06-07 – 2017-06-09 (×10): 2 via ORAL
  Filled 2017-06-06 (×10): qty 2

## 2017-06-06 MED ORDER — FENTANYL CITRATE (PF) 100 MCG/2ML IJ SOLN
INTRAMUSCULAR | Status: DC | PRN
  Administered 2017-06-06: 50 ug via INTRAVENOUS

## 2017-06-06 MED ORDER — POVIDONE-IODINE 10 % EX SWAB: 2.0000 "application " | Freq: Once | CUTANEOUS | Status: DC

## 2017-06-06 MED ORDER — PHENYLEPHRINE HCL 10 MG/ML IJ SOLN
INTRAMUSCULAR | Status: DC | PRN
  Administered 2017-06-06: 80 ug via INTRAVENOUS
  Administered 2017-06-06: 120 ug via INTRAVENOUS
  Administered 2017-06-06 (×2): 80 ug via INTRAVENOUS

## 2017-06-06 MED ORDER — EPHEDRINE 5 MG/ML INJ
INTRAVENOUS | Status: AC
  Filled 2017-06-06: qty 10

## 2017-06-06 MED ORDER — ALBUMIN HUMAN 5 % IV SOLN
INTRAVENOUS | Status: DC | PRN
  Administered 2017-06-06: 17:00:00 via INTRAVENOUS

## 2017-06-06 MED ORDER — METHOCARBAMOL 500 MG PO TABS
500.0000 mg | ORAL_TABLET | Freq: Four times a day (QID) | ORAL | Status: DC | PRN
  Administered 2017-06-07 – 2017-06-09 (×4): 500 mg via ORAL
  Filled 2017-06-06 (×4): qty 1

## 2017-06-06 MED ORDER — PHENOL 1.4 % MT LIQD: 1.0000 | OROMUCOSAL | Status: DC | PRN

## 2017-06-06 MED ORDER — DIPHENHYDRAMINE HCL 12.5 MG/5ML PO ELIX
12.5000 mg | ORAL_SOLUTION | ORAL | Status: DC | PRN
  Administered 2017-06-06: 12.5 mg via ORAL
  Administered 2017-06-08 – 2017-06-09 (×4): 25 mg via ORAL
  Filled 2017-06-06 (×5): qty 10

## 2017-06-06 MED ORDER — ONDANSETRON HCL 4 MG/2ML IJ SOLN: 4.0000 mg | Freq: Once | INTRAMUSCULAR | Status: DC | PRN

## 2017-06-06 MED ORDER — HYDROMORPHONE HCL 1 MG/ML IJ SOLN
0.2500 mg | INTRAMUSCULAR | Status: DC | PRN
  Administered 2017-06-06 (×4): 0.5 mg via INTRAVENOUS

## 2017-06-06 MED ORDER — KETOROLAC TROMETHAMINE 30 MG/ML IJ SOLN
INTRAMUSCULAR | Status: DC | PRN
  Administered 2017-06-06: 30 mg via INTRA_ARTICULAR

## 2017-06-06 MED ORDER — SODIUM CHLORIDE 0.9 % IR SOLN
Status: DC | PRN
  Administered 2017-06-06: 1000 mL

## 2017-06-06 MED ORDER — METOCLOPRAMIDE HCL 5 MG/ML IJ SOLN
5.0000 mg | Freq: Three times a day (TID) | INTRAMUSCULAR | Status: DC | PRN
  Administered 2017-06-06: 10 mg via INTRAVENOUS
  Filled 2017-06-06 (×2): qty 2

## 2017-06-06 MED ORDER — ALBUTEROL SULFATE (2.5 MG/3ML) 0.083% IN NEBU
2.5000 mg | INHALATION_SOLUTION | Freq: Four times a day (QID) | RESPIRATORY_TRACT | Status: DC | PRN
  Filled 2017-06-06: qty 3

## 2017-06-06 MED ORDER — CHLORHEXIDINE GLUCONATE 4 % EX LIQD: 60.0000 mL | Freq: Once | CUTANEOUS | Status: DC

## 2017-06-06 MED ORDER — BUPIVACAINE IN DEXTROSE 0.75-8.25 % IT SOLN
INTRATHECAL | Status: DC | PRN
  Administered 2017-06-06: 12 mg via INTRATHECAL

## 2017-06-06 MED ORDER — DOCUSATE SODIUM 100 MG PO CAPS
100.0000 mg | ORAL_CAPSULE | Freq: Two times a day (BID) | ORAL | Status: DC
  Administered 2017-06-07 – 2017-06-09 (×4): 100 mg via ORAL
  Filled 2017-06-06 (×5): qty 1

## 2017-06-06 MED ORDER — LIDOCAINE HCL (CARDIAC) 20 MG/ML IV SOLN
INTRAVENOUS | Status: DC | PRN
  Administered 2017-06-06: 80 mg via INTRATRACHEAL

## 2017-06-06 MED ORDER — KETOROLAC TROMETHAMINE 30 MG/ML IJ SOLN
INTRAMUSCULAR | Status: AC
  Filled 2017-06-06: qty 1

## 2017-06-06 MED ORDER — PROPOFOL 500 MG/50ML IV EMUL
INTRAVENOUS | Status: DC | PRN
  Administered 2017-06-06: 100 ug/kg/min via INTRAVENOUS

## 2017-06-06 MED ORDER — HYDROCODONE-ACETAMINOPHEN 5-325 MG PO TABS: 1.0000 | ORAL_TABLET | ORAL | Status: DC | PRN

## 2017-06-06 MED ORDER — ONDANSETRON HCL 4 MG PO TABS: 4.0000 mg | ORAL_TABLET | Freq: Four times a day (QID) | ORAL | Status: DC | PRN

## 2017-06-06 MED ORDER — SODIUM CHLORIDE 0.9 % IR SOLN
Status: DC | PRN
  Administered 2017-06-06: 3000 mL

## 2017-06-06 MED ORDER — SODIUM CHLORIDE 0.9 % IV SOLN: INTRAVENOUS | Status: DC

## 2017-06-06 MED ORDER — HYDROMORPHONE HCL 1 MG/ML IJ SOLN
0.5000 mg | INTRAMUSCULAR | Status: DC | PRN
  Administered 2017-06-06 – 2017-06-08 (×5): 2 mg via INTRAVENOUS
  Administered 2017-06-08 (×2): 1 mg via INTRAVENOUS
  Administered 2017-06-09: 2 mg via INTRAVENOUS
  Filled 2017-06-06 (×7): qty 2
  Filled 2017-06-06 (×2): qty 1

## 2017-06-06 MED ORDER — CEFAZOLIN SODIUM-DEXTROSE 2-4 GM/100ML-% IV SOLN: 2.0000 g | Freq: Four times a day (QID) | INTRAVENOUS | Status: DC

## 2017-06-06 MED ORDER — HYDROMORPHONE HCL 1 MG/ML IJ SOLN
INTRAMUSCULAR | Status: AC
  Administered 2017-06-06: 1 mg via INTRAVENOUS
  Filled 2017-06-06: qty 1

## 2017-06-06 MED ORDER — MEPERIDINE HCL 25 MG/ML IJ SOLN: 6.2500 mg | INTRAMUSCULAR | Status: DC | PRN

## 2017-06-06 MED ORDER — METHOCARBAMOL 1000 MG/10ML IJ SOLN
500.0000 mg | Freq: Four times a day (QID) | INTRAVENOUS | Status: DC | PRN
  Administered 2017-06-06: 500 mg via INTRAVENOUS
  Filled 2017-06-06: qty 5

## 2017-06-06 MED ORDER — ACETAMINOPHEN 325 MG PO TABS
650.0000 mg | ORAL_TABLET | ORAL | Status: DC | PRN
  Administered 2017-06-09: 650 mg via ORAL
  Filled 2017-06-06: qty 2

## 2017-06-06 MED ORDER — ALBUTEROL SULFATE HFA 108 (90 BASE) MCG/ACT IN AERS: 2.0000 | INHALATION_SPRAY | Freq: Four times a day (QID) | RESPIRATORY_TRACT | Status: DC | PRN

## 2017-06-06 SURGICAL SUPPLY — 54 items
ADH SKN CLS APL DERMABOND .7 (GAUZE/BANDAGES/DRESSINGS) ×1
ALCOHOL ISOPROPYL (RUBBING) (MISCELLANEOUS) ×2 IMPLANT
BLADE CLIPPER SURG (BLADE) IMPLANT
CAPT HIP TOTAL 2 ×1 IMPLANT
CHLORAPREP W/TINT 26ML (MISCELLANEOUS) ×2 IMPLANT
COVER SURGICAL LIGHT HANDLE (MISCELLANEOUS) ×2 IMPLANT
DERMABOND ADVANCED (GAUZE/BANDAGES/DRESSINGS) ×1
DERMABOND ADVANCED .7 DNX12 (GAUZE/BANDAGES/DRESSINGS) ×2 IMPLANT
DRAPE C-ARM 42X72 X-RAY (DRAPES) ×2 IMPLANT
DRAPE STERI IOBAN 125X83 (DRAPES) ×2 IMPLANT
DRAPE U-SHAPE 47X51 STRL (DRAPES) ×6 IMPLANT
DRSG AQUACEL AG ADV 3.5X10 (GAUZE/BANDAGES/DRESSINGS) ×2 IMPLANT
ELECT BLADE 4.0 EZ CLEAN MEGAD (MISCELLANEOUS) ×2
ELECT REM PT RETURN 9FT ADLT (ELECTROSURGICAL) ×2
ELECTRODE BLDE 4.0 EZ CLN MEGD (MISCELLANEOUS) ×1 IMPLANT
ELECTRODE REM PT RTRN 9FT ADLT (ELECTROSURGICAL) ×1 IMPLANT
EVACUATOR 1/8 PVC DRAIN (DRAIN) IMPLANT
GLOVE BIO SURGEON STRL SZ8.5 (GLOVE) ×4 IMPLANT
GLOVE BIOGEL PI IND STRL 8.5 (GLOVE) ×1 IMPLANT
GLOVE BIOGEL PI INDICATOR 8.5 (GLOVE) ×1
GOWN STRL REUS W/ TWL LRG LVL3 (GOWN DISPOSABLE) ×2 IMPLANT
GOWN STRL REUS W/TWL 2XL LVL3 (GOWN DISPOSABLE) ×2 IMPLANT
GOWN STRL REUS W/TWL LRG LVL3 (GOWN DISPOSABLE) ×4
HANDPIECE INTERPULSE COAX TIP (DISPOSABLE) ×2
HOOD PEEL AWAY FACE SHEILD DIS (HOOD) ×4 IMPLANT
KIT BASIN OR (CUSTOM PROCEDURE TRAY) ×2 IMPLANT
KIT ROOM TURNOVER OR (KITS) ×2 IMPLANT
MANIFOLD NEPTUNE II (INSTRUMENTS) ×2 IMPLANT
MARKER SKIN DUAL TIP RULER LAB (MISCELLANEOUS) ×4 IMPLANT
NDL SPNL 18GX3.5 QUINCKE PK (NEEDLE) ×1 IMPLANT
NEEDLE SPNL 18GX3.5 QUINCKE PK (NEEDLE) ×2 IMPLANT
NS IRRIG 1000ML POUR BTL (IV SOLUTION) ×2 IMPLANT
PACK TOTAL JOINT (CUSTOM PROCEDURE TRAY) ×2 IMPLANT
PACK UNIVERSAL I (CUSTOM PROCEDURE TRAY) ×2 IMPLANT
PAD ARMBOARD 7.5X6 YLW CONV (MISCELLANEOUS) ×4 IMPLANT
SAW OSC TIP CART 19.5X105X1.3 (SAW) ×2 IMPLANT
SEALER BIPOLAR AQUA 6.0 (INSTRUMENTS) IMPLANT
SET HNDPC FAN SPRY TIP SCT (DISPOSABLE) ×1 IMPLANT
SOL PREP POV-IOD 4OZ 10% (MISCELLANEOUS) ×2 IMPLANT
SUT ETHIBOND NAB CT1 #1 30IN (SUTURE) ×4 IMPLANT
SUT MNCRL AB 3-0 PS2 18 (SUTURE) ×2 IMPLANT
SUT MON AB 2-0 CT1 36 (SUTURE) ×2 IMPLANT
SUT VIC AB 1 CT1 27 (SUTURE) ×2
SUT VIC AB 1 CT1 27XBRD ANBCTR (SUTURE) ×1 IMPLANT
SUT VIC AB 2-0 CT1 27 (SUTURE) ×2
SUT VIC AB 2-0 CT1 TAPERPNT 27 (SUTURE) ×1 IMPLANT
SUT VLOC 180 0 24IN GS25 (SUTURE) ×2 IMPLANT
SYR 50ML LL SCALE MARK (SYRINGE) ×2 IMPLANT
TOWEL OR 17X24 6PK STRL BLUE (TOWEL DISPOSABLE) ×2 IMPLANT
TOWEL OR 17X26 10 PK STRL BLUE (TOWEL DISPOSABLE) ×2 IMPLANT
TRAY CATH 16FR W/PLASTIC CATH (SET/KITS/TRAYS/PACK) IMPLANT
TRAY FOLEY CATH SILVER 16FR (SET/KITS/TRAYS/PACK) IMPLANT
TRAY URETHRAL FOLEY CATH 14FR (CATHETERS) ×1 IMPLANT
WATER STERILE IRR 1000ML POUR (IV SOLUTION) ×6 IMPLANT

## 2017-06-06 NOTE — Discharge Instructions (Signed)
°Dr. Cassandr Cederberg °Joint Replacement Specialist °[facility] °[address] °[place], [place] [address] °[phone] ° ° °TOTAL HIP REPLACEMENT POSTOPERATIVE DIRECTIONS ° ° ° °Hip Rehabilitation, Guidelines Following Surgery  ° °WEIGHT BEARING °Weight bearing as tolerated with assist device (walker, cane, etc) as directed, use it as long as suggested by your surgeon or therapist, typically at least 4-6 weeks. ° °The results of a hip operation are greatly improved after range of motion and muscle strengthening exercises. Follow all safety measures which are given to protect your hip. If any of these exercises cause increased pain or swelling in your joint, decrease the amount until you are comfortable again. Then slowly increase the exercises. Call your caregiver if you have problems or questions.  ° °HOME CARE INSTRUCTIONS  °Most of the following instructions are designed to prevent the dislocation of your new hip.  °Remove items at home which could result in a fall. This includes throw rugs or furniture in walking pathways.  °Continue medications as instructed at time of discharge. °· You may have some home medications which will be placed on hold until you complete the course of blood thinner medication. °· You may start showering once you are discharged home. Do not remove your dressing. °Do not put on socks or shoes without following the instructions of your caregivers.   °Sit on chairs with arms. Use the chair arms to help push yourself up when arising.  °Arrange for the use of a toilet seat elevator so you are not sitting low.  °· Walk with walker as instructed.  °You may resume a sexual relationship in one month or when given the OK by your caregiver.  °Use walker as long as suggested by your caregivers.  °You may put full weight on your legs and walk as much as is comfortable. °Avoid periods of inactivity such as sitting longer than an hour when not asleep. This helps prevent  blood clots.  °You may return to work once you are cleared by your surgeon.  °Do not drive a car for 6 weeks or until released by your surgeon.  °Do not drive while taking narcotics.  °Wear elastic stockings for two weeks following surgery during the day but you may remove then at night.  °Make sure you keep all of your appointments after your operation with all of your doctors and caregivers. You should call the office at the above phone number and make an appointment for approximately two weeks after the date of your surgery. °Please pick up a stool softener and laxative for home use as long as you are requiring pain medications. °· ICE to the affected hip every three hours for 30 minutes at a time and then as needed for pain and swelling. Continue to use ice on the hip for pain and swelling from surgery. You may notice swelling that will progress down to the foot and ankle.  This is normal after surgery.  Elevate the leg when you are not up walking on it.   °It is important for you to complete the blood thinner medication as prescribed by your doctor. °· Continue to use the breathing machine which will help keep your temperature down.  It is common for your temperature to cycle up and down following surgery, especially at night when you are not up moving around and exerting yourself.  The breathing machine keeps your lungs expanded and your temperature down. ° °RANGE OF MOTION AND STRENGTHENING EXERCISES  °These exercises are   designed to help you keep full movement of your hip joint. Follow your caregiver's or physical therapist's instructions. Perform all exercises about fifteen times, three times per day or as directed. Exercise both hips, even if you have had only one joint replacement. These exercises can be done on a training (exercise) mat, on the floor, on a table or on a bed. Use whatever works the best and is most comfortable for you. Use music or television while you are exercising so that the exercises  are a pleasant break in your day. This will make your life better with the exercises acting as a break in routine you can look forward to.  °Lying on your back, slowly slide your foot toward your buttocks, raising your knee up off the floor. Then slowly slide your foot back down until your leg is straight again.  °Lying on your back spread your legs as far apart as you can without causing discomfort.  °Lying on your side, raise your upper leg and foot straight up from the floor as far as is comfortable. Slowly lower the leg and repeat.  °Lying on your back, tighten up the muscle in the front of your thigh (quadriceps muscles). You can do this by keeping your leg straight and trying to raise your heel off the floor. This helps strengthen the largest muscle supporting your knee.  °Lying on your back, tighten up the muscles of your buttocks both with the legs straight and with the knee bent at a comfortable angle while keeping your heel on the floor.  ° °SKILLED REHAB INSTRUCTIONS: °If the patient is transferred to a skilled rehab facility following release from the hospital, a list of the current medications will be sent to the facility for the patient to continue.  When discharged from the skilled rehab facility, please have the facility set up the patient's Home Health Physical Therapy prior to being released. Also, the skilled facility will be responsible for providing the patient with their medications at time of release from the facility to include their pain medication and their blood thinner medication. If the patient is still at the rehab facility at time of the two week follow up appointment, the skilled rehab facility will also need to assist the patient in arranging follow up appointment in our office and any transportation needs. ° °MAKE SURE YOU:  °Understand these instructions.  °Will watch your condition.  °Will get help right away if you are not doing well or get worse. ° °Pick up stool softner and  laxative for home use following surgery while on pain medications. °Do not remove your dressing. °The dressing is waterproof--it is OK to take showers. °Continue to use ice for pain and swelling after surgery. °Do not use any lotions or creams on the incision until instructed by your surgeon. °Total Hip Protocol. ° ° °

## 2017-06-06 NOTE — Anesthesia Preprocedure Evaluation (Signed)
Anesthesia Evaluation  Patient identified by MRN, date of birth, ID band Patient awake    Reviewed: Allergy & Precautions, NPO status , Patient's Chart, lab work & pertinent test results  History of Anesthesia Complications (+) PONV  Airway Mallampati: II  TM Distance: >3 FB Neck ROM: Full    Dental no notable dental hx.    Pulmonary asthma , former smoker,    breath sounds clear to auscultation       Cardiovascular hypertension,  Rhythm:Regular Rate:Normal     Neuro/Psych    GI/Hepatic Neg liver ROS, hiatal hernia, GERD  ,  Endo/Other  negative endocrine ROS  Renal/GU negative Renal ROS     Musculoskeletal  (+) Arthritis ,   Abdominal   Peds  Hematology   Anesthesia Other Findings   Reproductive/Obstetrics                             Anesthesia Physical Anesthesia Plan  ASA: III  Anesthesia Plan: Spinal   Post-op Pain Management:    Induction: Intravenous  PONV Risk Score and Plan: 4 or greater and Treatment may vary due to age or medical condition, Ondansetron and Dexamethasone  Airway Management Planned: Natural Airway and Simple Face Mask  Additional Equipment:   Intra-op Plan:   Post-operative Plan:   Informed Consent: I have reviewed the patients History and Physical, chart, labs and discussed the procedure including the risks, benefits and alternatives for the proposed anesthesia with the patient or authorized representative who has indicated his/her understanding and acceptance.   Dental advisory given  Plan Discussed with: CRNA  Anesthesia Plan Comments:         Anesthesia Quick Evaluation

## 2017-06-06 NOTE — Anesthesia Procedure Notes (Signed)
Spinal  Patient location during procedure: OR Start time: 06/06/2017 4:15 PM End time: 06/06/2017 4:20 PM Staffing Anesthesiologist: Janeece Riggers, MD Preanesthetic Checklist Completed: patient identified, site marked, surgical consent, pre-op evaluation, timeout performed, IV checked, risks and benefits discussed and monitors and equipment checked Spinal Block Patient position: sitting Prep: DuraPrep Patient monitoring: heart rate, cardiac monitor, continuous pulse ox and blood pressure Approach: midline Location: L4-5 Injection technique: single-shot Needle Needle type: Sprotte  Needle gauge: 24 G Needle length: 9 cm Assessment Sensory level: T4

## 2017-06-06 NOTE — Op Note (Signed)
OPERATIVE REPORT  SURGEON: Rod Can, MD   ASSISTANT: Ky Barban, RNFA.  PREOPERATIVE DIAGNOSIS: Right hip arthritis.   POSTOPERATIVE DIAGNOSIS: Right hip arthritis.   PROCEDURE: Right total hip arthroplasty, anterior approach.   IMPLANTS: DePuy Tri Lock stem, size 5, hi offset. DePuy Pinnacle Cup, size 50 mm. DePuy Altrx liner, size 32 by 50 mm, neutral. DePuy Biolox ceramic head ball, size 32 + 5 mm.  ANESTHESIA:  Spinal  ESTIMATED BLOOD LOSS:-450 mL    ANTIBIOTICS: 2 g Ancef.  DRAINS: None.  COMPLICATIONS: None.   CONDITION: PACU - hemodynamically stable.   BRIEF CLINICAL NOTE: [name] is a 51 y.o. female with a long-standing history of Right hip arthritis. After failing conservative management, the patient was indicated for total hip arthroplasty. The risks, benefits, and alternatives to the procedure were explained, and the patient elected to proceed.  PROCEDURE IN DETAIL: Surgical site was marked by myself in the pre-op holding area. Once inside the operating room, spinal anesthesia was obtained, and a foley catheter was inserted. The patient was then positioned on the Hana table. All bony prominences were well padded. The hip was prepped and draped in the normal sterile surgical fashion. A time-out was called verifying side and site of surgery. The patient received IV antibiotics within 60 minutes of beginning the procedure.  The direct anterior approach to the hip was performed through the Hueter interval. Lateral femoral circumflex vessels were treated with the Auqumantys. The anterior capsule was exposed and an inverted T capsulotomy was made.The femoral neck cut was made to the level of the templated cut. A corkscrew was placed into the head and the head was removed. The femoral head was found to have eburnated bone. The head was passed to the back table and was measured.  Acetabular exposure was achieved, and the pulvinar and labrum were  excised. Sequential reaming of the acetabulum was then performed up to a size 49 mm reamer. A 50 mm cup was then opened and impacted into place at approximately 40 degrees of abduction and 20 degrees of anteversion. The final polyethylene liner was impacted into place and acetabular osteophytes were removed.   I then gained femoral exposure taking care to protect the abductors and greater trochanter. This was performed using standard external rotation, extension, and adduction. The capsule was peeled off the inner aspect of the greater trochanter, taking care to preserve the short external rotators. A cookie cutter was used to enter the femoral canal, and then the femoral canal finder was placed. Sequential broaching was performed up to a size 5. Calcar planer was used on the femoral neck remnant. I placed a hi offset neck and a trial head ball. The hip was reduced. Leg lengths and offset were checked fluoroscopically. The hip was dislocated and trial components were removed. The final implants were placed, and the hip was reduced.  Fluoroscopy was used to confirm component position and leg lengths. At 90 degrees of external rotation and full extension, the hip was stable to an anterior directed force.  The wound was copiously irrigated with normal saline using pulse lavage. Marcaine solution was injected into the periarticular soft tissue. The wound was closed in layers using #1 Vicryl and V-Loc for the fascia, 2-0 Vicryl for the subcutaneous fat, 2-0 Monocryl for the deep dermal layer, 3-0 running Monocryl subcuticular stitch, and Dermabond for the skin. Once the glue was fully dried, an Aquacell Ag dressing was applied. The patient was transported to the recovery room  in stable condition. Sponge, needle, and instrument counts were correct at the end of the case x2. The patient tolerated the procedure well and there were no known complications.

## 2017-06-06 NOTE — Consult Note (Signed)
Primary Physician: Primary Cardiologist:    Reason for Consultation: ECG changes, chest pain  HPI:    [name] is seen today for evaluation of ECG changes and chest pain at the request of Dr. Conrad Shongopovi.   51 yo with history of HTN and asthma had right THR today for severe osteoarthritis of right hip.  Transient LBBB was noted in the OR and she reported chest pain immediately post-op.  ECG in PACU was normal and chest pain had almost resolved. Main complaint now is surgical site pain.   Currently, she reports mild central chest pain primarily when she takes a deep breath.  She says it feels little like an asthma flare but she is not wheezing.  She does feel mildly short of breath.  She has never had heart problems.  She does not smoke.  Father had "heart problems."  Sister had a pacemaker and died from "heart problems" at age 26. In the past, she has had chest pain only with asthma exacerbations.    Review of Systems: All systems reviewed and negative except as per HPI.   Home Medications Prior to Admission medications   Medication Sig Start Date End Date Taking? Authorizing Provider  acetaminophen (TYLENOL) 500 MG tablet Take 1,000 mg by mouth every 6 (six) hours as needed (for pain.).   Yes [provider]  albuterol (PROVENTIL HFA;VENTOLIN HFA) 108 (90 Base) MCG/ACT inhaler Inhale 2 puffs into the lungs every 6 (six) hours as needed for wheezing or shortness of breath. 08/11/16  Yes Ledell Noss, MD  amLODipine (NORVASC) 10 MG tablet Take 1 tablet (10 mg total) by mouth daily. 11/17/16  Yes Alphonzo Grieve, MD  cetirizine (ZYRTEC) 10 MG tablet Take 10 mg by mouth daily.   Yes [provider]  ferrous sulfate 325 (65 FE) MG tablet Take 1 tablet (325 mg total) by mouth daily. 08/11/16 08/11/17 Yes Ledell Noss, MD  ibuprofen (ADVIL,MOTRIN) 200 MG tablet Take 800-1,000 mg by mouth every 6 (six) hours as needed (for pain.).   Yes [provider]  Menthol-Methyl  Salicylate (MUSCLE RUB) 10-15 % CREA Apply 1 application topically 4 (four) times daily as needed for muscle pain.   Yes [provider]  albuterol (PROVENTIL) (2.5 MG/3ML) 0.083% nebulizer solution Take 3 mLs (2.5 mg total) by nebulization every 6 (six) hours as needed for wheezing or shortness of breath. 08/12/16   Ledell Noss, MD  budesonide-formoterol (SYMBICORT) 80-4.5 MCG/ACT inhaler INHALE TWO PUFFS BY MOUTH INTO LUNGS TWICE DAILY Patient taking differently: Inhale 2 puffs into the lungs 2 (two) times daily as needed (for respiratory issues.). INHALE TWO PUFFS BY MOUTH INTO LUNGS TWICE DAILY 11/17/16   Alphonzo Grieve, MD  cholecalciferol (VITAMIN D) 1000 units tablet Take 1 tablet (1,000 Units total) by mouth daily. Patient not taking: Reported on 05/30/2017 11/18/16   Alphonzo Grieve, MD  diclofenac sodium (VOLTAREN) 1 % GEL Apply 4 g topically 4 (four) times daily. Patient not taking: Reported on 05/30/2017 08/11/16   Ledell Noss, MD  fluticasone [facility]) 50 MCG/ACT nasal spray USE TWO SPRAY(S) IN [facility] NOSTRIL ONCE DAILY Patient not taking: Reported on 05/30/2017 08/05/16   Alphonzo Grieve, MD  oxyCODONE-acetaminophen (PERCOCET) 10-325 MG tablet Take 1 tablet by mouth every 6 (six) hours as needed for pain. Patient not taking: Reported on 05/30/2017 03/01/17 03/01/18  Lorella Nimrod, MD    Past Medical History: 1. Asthma 2. GERD with hiatal hernia 3. PUD 4. HTN 5.  OA  Past Surgical History: Past Surgical History:  Procedure Laterality Date  . BARTHOLIN GLAND CYST EXCISION    . BREAST LUMPECTOMY WITH RADIOACTIVE SEED LOCALIZATION Left 01/23/2016   Procedure: BREAST LUMPECTOMY WITH RADIOACTIVE SEED LOCALIZATION;  Surgeon: Jackolyn Confer, MD;  Location: [place];  Service: General;  Laterality: Left;  . CESAREAN SECTION  1989  . CHOLECYSTECTOMY  ~ 2000  . EPIGASTRIC HERNIA REPAIR  07/2003  . HERNIA REPAIR    . INCISION AND DRAINAGE ABSCESS  10/2005   Bartholin abscess.  .  TONSILLECTOMY AND ADENOIDECTOMY  1983  . TUBAL LIGATION  1994    Family History: Family History  Problem Relation Age of Onset  . Cancer Mother        unknown  . Heart attack Father   . Heart attack Sister     Social History: Social History   Socioeconomic History  . Marital status: Single    Spouse name: None  . Number of children: None  . Years of education: None  . Highest education level: None  Social Needs  . Financial resource strain: None  . Food insecurity - worry: None  . Food insecurity - inability: None  . Transportation needs - medical: None  . Transportation needs - non-medical: None  Occupational History  . Occupation: residential worker  Tobacco Use  . Smoking status: Former Smoker    Years: 1.00    Types: Cigarettes    Last attempt to quit: 06/01/2014    Years since quitting: 3.0  . Smokeless tobacco: Never Used  Substance and Sexual Activity  . Alcohol use: Yes    Alcohol/week: 0.0 oz    Comment: socially  . Drug use: No  . Sexual activity: Yes  Other Topics Concern  . None  Social History Narrative  . None    Allergies:  Allergies  Allergen Reactions  . Amoxicillin Rash    PATIENT HAS HAD A PCN REACTION WITH IMMEDIATE RASH, FACIAL/TONGUE/THROAT SWELLING, SOB, OR LIGHTHEADEDNESS WITH HYPOTENSION:  #  #  #  YES  #  #  #   HAS PT DEVELOPED SEVERE RASH INVOLVING MUCUS MEMBRANES or SKIN NECROSIS: #  #  #  YES  #  #  #     "On my face" Has patient had a PCN reaction that required hospitalization: No Has patient had a PCN reaction occurring within the last 10 years:  #  #  #  UNKNOWN  #  #  #  .   Marland Kitchen Ampicillin Rash    PATIENT HAS HAD A PCN REACTION WITH IMMEDIATE RASH, FACIAL/TONGUE/THROAT SWELLING, SOB, OR LIGHTHEADEDNESS WITH HYPOTENSION: # # # YES # # #  HAS PT DEVELOPED SEVERE RASH INVOLVING MUCUS MEMBRANES or SKIN NECROSIS: # # # YES # # # "On my face"e Has patient had a PCN reaction that required hospitalization: No Has patient had a PCN  reaction occurring within the last 10 years:  #  #  #  UNKNOWN  #  #  #      . Mobic [Meloxicam] Rash  . Montelukast Sodium Rash  . Phenergan [Promethazine Hcl] Nausea And Vomiting    Objective:    Vital Signs:   Temp:  [97.5 F (36.4 C)-98.6 F (37 C)] 97.7 F (36.5 C) (12/03 2130) Pulse Rate:  [66-141] 94 (12/03 2130) Resp:  [12-31] 19 (12/03 2130) BP: (119-164)/(71-103) 134/95 (12/03 2130) SpO2:  [100 %] 100 % (12/03 2130) Weight:  [170 lb (77.1 kg)] 170  lb (77.1 kg) (12/03 1245)    Weight change: Filed Weights   06/06/17 1245  Weight: 170 lb (77.1 kg)    Intake/Output:   Intake/Output Summary (Last 24 hours) at 06/06/2017 2144 Last data filed at 06/06/2017 1939 Gross per 24 hour  Intake 1250 ml  Output 1500 ml  Net -250 ml      Physical Exam    General:  Well appearing. No resp difficulty HEENT: normal Neck: supple. JVP not elevated. Carotids 2+ bilat; no bruits. No lymphadenopathy or thyromegaly appreciated. Cor: PMI nondisplaced. Regular rate & rhythm. No rubs, gallops or murmurs. Lungs: clear Abdomen: soft, nontender, nondistended. No hepatosplenomegaly. No bruits or masses. Good bowel sounds. Extremities: no cyanosis, clubbing, rash, edema Neuro: alert & orientedx3, cranial nerves grossly intact. moves all 4 extremities w/o difficulty. Affect pleasant   EKG    NSR, normal (personally reviewed)  Labs   Basic Metabolic Panel: Recent Labs  Lab 06/01/17 0943  NA 135  K 3.6  CL 105  CO2 24  GLUCOSE 132*  BUN 10  CREATININE 0.51  CALCIUM 9.4    Liver Function Tests: No results for input(s): AST, ALT, ALKPHOS, BILITOT, PROT, ALBUMIN in the last 168 hours. No results for input(s): LIPASE, AMYLASE in the last 168 hours. No results for input(s): AMMONIA in the last 168 hours.  CBC: Recent Labs  Lab 06/01/17 0943  WBC 5.7  HGB 11.5*  HCT 34.5*  MCV 83.7  PLT 220    Cardiac Enzymes: No results for input(s): CKTOTAL, CKMB, CKMBINDEX,  TROPONINI in the last 168 hours.  BNP: BNP (last 3 results) No results for input(s): BNP in the last 8760 hours.  ProBNP (last 3 results) No results for input(s): PROBNP in the last 8760 hours.   CBG: No results for input(s): GLUCAP in the last 168 hours.  Coagulation Studies: No results for input(s): LABPROT, INR in the last 72 hours.   Imaging   Dg Pelvis Portable  Result Date: 06/06/2017 CLINICAL DATA:  RIGHT hip arthroplasty. EXAM: PORTABLE PELVIS 1-2 VIEWS COMPARISON:  MRI of the RIGHT hip January 26, 2017 FINDINGS: Status post recent RIGHT hip total arthroplasty with intact well-seated femoral and acetabular non sub cemented hardware. No fracture deformity. No dislocation. Shallow LEFT acetabula with mild osteoarthrosis. RIGHT hip soft tissue gas consistent with recent surgery. IMPRESSION: Status post RIGHT hip total arthroplasty with expected postoperative change. Electronically Signed   By: Elon Alas M.D.   On: 06/06/2017 19:27   Dg C-arm 61-120 Min  Result Date: 06/06/2017 CLINICAL DATA:  Right hip arthroplasty. EXAM: DG C-ARM 61-120 MIN; OPERATIVE RIGHT HIP WITH PELVIS COMPARISON:  None FLUOROSCOPY TIME:  18 seconds FINDINGS: Right total hip arthroplasty without failure or complication. Mild osteoarthritis of the left hip. IMPRESSION: Interval right total hip arthroplasty. Electronically Signed   By: Kathreen Devoid   On: 06/06/2017 19:45   Dg Hip Operative Unilat W Or W/o Pelvis Right  Result Date: 06/06/2017 CLINICAL DATA:  Right hip arthroplasty. EXAM: DG C-ARM 61-120 MIN; OPERATIVE RIGHT HIP WITH PELVIS COMPARISON:  None FLUOROSCOPY TIME:  18 seconds FINDINGS: Right total hip arthroplasty without failure or complication. Mild osteoarthritis of the left hip. IMPRESSION: Interval right total hip arthroplasty. Electronically Signed   By: Kathreen Devoid   On: 06/06/2017 19:45      Medications:     Current Medications: . HYDROmorphone      . HYDROmorphone          Infusions: . methocarbamol (  ROBAXIN)  IV 500 mg (06/06/17 2127)       Patient Profile   51 yo with history of HTN and asthma had transient LBBB and chest pain in OR today after THR.    Assessment/Plan   1. Chest pain: Currently mild and atypical, it is pleuritic.  She says that it feels something like her prior asthma flares.  She is not wheezing on exam.  Nonsmoker, she does have a family history of "heart disease" though she does not know much in the way of details.  Possible asthma flare, think less likely ischemic pain. The one concern I have is the LBBB that she was reported to have transiently in the OR. Post-OR ECG was normal.  - Repeat ECG, keep on telemetry for now.  - I will arrange for echo in the am.  - Cycle troponin.  - I will write her for Duonebs given asthma history.  2. HTN: BP mildly elevated.  3. Asthma: ?Component of asthma flare to symptoms.   4. Post-THR: Per surgery.   Length of Stay: 0  Loralie Champagne, MD  06/06/2017, 9:44 PM  Advanced Heart Failure Team Pager [phone] (M-F; 7a - 4p)  Please contact [facility] for night-coverage after hours (4p -7a ) and weekends on [email or web address]

## 2017-06-06 NOTE — Anesthesia Postprocedure Evaluation (Signed)
Anesthesia Post Note  Patient: [name]  Procedure(s) Performed: RIGHT TOTAL HIP ARTHROPLASTY ANTERIOR APPROACH (Right Hip)     Patient location during evaluation: PACU Anesthesia Type: Spinal Level of consciousness: awake and alert Pain management: pain level controlled Vital Signs Assessment: post-procedure vital signs reviewed and stable Respiratory status: spontaneous breathing, nonlabored ventilation, respiratory function stable and patient connected to nasal cannula oxygen Cardiovascular status: blood pressure returned to baseline and stable Postop Assessment: no apparent nausea or vomiting Anesthetic complications: no Comments: Pt devloped new L BBB while in OR. In PACU EKG normal but pt complained of some chest pain. -> cardiology Consult. Spoke with Dr Marigene Ehlers. Draw troponin and send to telemetry when ready to leave PACU    Last Vitals:  Vitals:   06/06/17 2130 06/06/17 2154  BP: (!) 134/95 (!) 148/73  Pulse: 94 86  Resp: 19 18  Temp: 36.5 C 36.7 C  SpO2: 100% 100%    Last Pain:  Vitals:   06/06/17 2154  TempSrc: Oral  PainSc:                  Temprance Wyre DAVID

## 2017-06-06 NOTE — Transfer of Care (Signed)
Immediate Anesthesia Transfer of Care Note  Patient: [name]  Procedure(s) Performed: RIGHT TOTAL HIP ARTHROPLASTY ANTERIOR APPROACH (Right Hip)  Patient Location: PACU  Anesthesia Type:MAC and Spinal  Level of Consciousness: awake, alert , oriented and patient cooperative  Airway & Oxygen Therapy: Patient Spontanous Breathing  Post-op Assessment: Report given to RN and Post -op Vital signs reviewed and stable  Post vital signs: Reviewed and stable  Last Vitals:  Vitals:   06/06/17 1235  BP: (!) 164/84  Pulse: 77  Resp: 17  Temp: 37 C  SpO2: 100%    Last Pain:  Vitals:   06/06/17 1246  TempSrc:   PainSc: 10-Worst pain ever         Complications: No apparent anesthesia complications

## 2017-06-06 NOTE — Anesthesia Procedure Notes (Signed)
Procedure Name: MAC Date/Time: 06/06/2017 4:20 PM Performed by: White, Amedeo Plenty, CRNA Pre-anesthesia Checklist: Patient identified, Emergency Drugs available, Suction available and Patient being monitored Patient Re-evaluated:Patient Re-evaluated prior to induction Oxygen Delivery Method: Simple face mask

## 2017-06-06 NOTE — Interval H&P Note (Signed)
History and Physical Interval Note:  06/06/2017 3:21 PM  [name]  has presented today for surgery, with the diagnosis of Degenerative joint disease right hip  The various methods of treatment have been discussed with the patient and family. After consideration of risks, benefits and other options for treatment, the patient has consented to  Procedure(s) with comments: RIGHT TOTAL HIP ARTHROPLASTY ANTERIOR APPROACH (Right) - Needs RNFA as a surgical intervention .  The patient's history has been reviewed, patient examined, no change in status, stable for surgery.  I have reviewed the patient's chart and labs.  Questions were answered to the patient's satisfaction.     Hilton Cork [place]

## 2017-06-06 NOTE — Progress Notes (Signed)
D; Dr. Conrad [place] at bedside, pt was crying c/o pain, dilaudid 1mg  IV now and after pain contrrol, can go to floor with telemetry. Troponin lab ordered.  Dilaudid 1mg  IV given. Pt said 7/10 pain level, but fell in sleep.

## 2017-06-06 NOTE — Progress Notes (Signed)
D;assessed bladder, pt stated need bathroom, but she cannot go. Bladder scan done, > 572ml, notified MD, got I&O cath order received. Noticed wet the bed, 618ml clear urine obtained. Changed bed.

## 2017-06-06 NOTE — OR Nursing (Signed)
1820: in&out cath=450cc cyu, per protocol, no trauma

## 2017-06-07 ENCOUNTER — Encounter (HOSPITAL_COMMUNITY): Payer: Self-pay | Admitting: Internal Medicine

## 2017-06-07 ENCOUNTER — Other Ambulatory Visit: Payer: Self-pay

## 2017-06-07 ENCOUNTER — Inpatient Hospital Stay (HOSPITAL_COMMUNITY): Payer: No Typology Code available for payment source

## 2017-06-07 DIAGNOSIS — I361 Nonrheumatic tricuspid (valve) insufficiency: Secondary | ICD-10-CM

## 2017-06-07 DIAGNOSIS — Z96641 Presence of right artificial hip joint: Secondary | ICD-10-CM

## 2017-06-07 DIAGNOSIS — Z886 Allergy status to analgesic agent status: Secondary | ICD-10-CM

## 2017-06-07 DIAGNOSIS — Z87891 Personal history of nicotine dependence: Secondary | ICD-10-CM

## 2017-06-07 DIAGNOSIS — I447 Left bundle-branch block, unspecified: Secondary | ICD-10-CM

## 2017-06-07 DIAGNOSIS — Z833 Family history of diabetes mellitus: Secondary | ICD-10-CM

## 2017-06-07 DIAGNOSIS — K219 Gastro-esophageal reflux disease without esophagitis: Secondary | ICD-10-CM

## 2017-06-07 DIAGNOSIS — Z79899 Other long term (current) drug therapy: Secondary | ICD-10-CM

## 2017-06-07 DIAGNOSIS — Z950 Presence of cardiac pacemaker: Secondary | ICD-10-CM

## 2017-06-07 DIAGNOSIS — K5792 Diverticulitis of intestine, part unspecified, without perforation or abscess without bleeding: Secondary | ICD-10-CM

## 2017-06-07 DIAGNOSIS — Z881 Allergy status to other antibiotic agents status: Secondary | ICD-10-CM

## 2017-06-07 DIAGNOSIS — Z888 Allergy status to other drugs, medicaments and biological substances status: Secondary | ICD-10-CM

## 2017-06-07 DIAGNOSIS — J45909 Unspecified asthma, uncomplicated: Secondary | ICD-10-CM

## 2017-06-07 DIAGNOSIS — K449 Diaphragmatic hernia without obstruction or gangrene: Secondary | ICD-10-CM

## 2017-06-07 DIAGNOSIS — I10 Essential (primary) hypertension: Secondary | ICD-10-CM

## 2017-06-07 DIAGNOSIS — Z8249 Family history of ischemic heart disease and other diseases of the circulatory system: Secondary | ICD-10-CM

## 2017-06-07 LAB — BASIC METABOLIC PANEL
ANION GAP: 13 (ref 5–15)
BUN: 11 mg/dL (ref 6–20)
CALCIUM: 9.1 mg/dL (ref 8.9–10.3)
CO2: 21 mmol/L — ABNORMAL LOW (ref 22–32)
Chloride: 99 mmol/L — ABNORMAL LOW (ref 101–111)
Creatinine, Ser: 0.71 mg/dL (ref 0.44–1.00)
GFR calc Af Amer: >60 mL/min (ref >60)
GLUCOSE: 215 mg/dL — AB (ref 65–99)
POTASSIUM: 3.8 mmol/L (ref 3.5–5.1)
SODIUM: 133 mmol/L — AB (ref 135–145)

## 2017-06-07 LAB — ECHOCARDIOGRAM COMPLETE
AVLVOTPG: 11 mmHg
CHL CUP MV DEC (S): 157
E/e' ratio: 8.18
EWDT: 157 ms
FS: 25 % — AB (ref 28–44)
Height: 61 in
IV/PV OW: 1.01
LA diam end sys: 39 mm
LA diam index: 2.22 cm/m2
LASIZE: 39 mm
LAVOL: 50.1 mL
LAVOLA4C: 59 mL
LAVOLIN: 28.5 mL/m2
LDCA: 3.46 cm2
LV E/e' medial: 8.18
LV PW d: 13.6 mm — AB (ref 0.6–1.1)
LV TDI E'LATERAL: 11.6
LV TDI E'MEDIAL: 8.05
LV e' LATERAL: 11.6 cm/s
LVEEAVG: 8.18
LVOT SV: 101 mL
LVOT VTI: 29.1 cm
LVOTD: 21 mm
LVOTPV: 166 cm/s
Lateral S' vel: 19.1 cm/s
MV Peak grad: 4 mmHg
MV pk E vel: 94.9 m/s
MVPKAVEL: 86.3 m/s
TAPSE: 24.3 mm
WEIGHTICAEL: 2720 [oz_av]

## 2017-06-07 LAB — TROPONIN I
Troponin I: <0.03 ng/mL (ref <0.03)
Troponin I: <0.03 ng/mL (ref <0.03)

## 2017-06-07 LAB — CBC
HCT: 33.5 % — ABNORMAL LOW (ref 36.0–46.0)
Hemoglobin: 10.9 g/dL — ABNORMAL LOW (ref 12.0–15.0)
MCH: 27.3 pg (ref 26.0–34.0)
MCHC: 32.5 g/dL (ref 30.0–36.0)
MCV: 84 fL (ref 78.0–100.0)
PLATELETS: 271 10*3/uL (ref 150–400)
RBC: 3.99 MIL/uL (ref 3.87–5.11)
RDW: 15.8 % — AB (ref 11.5–15.5)
WBC: 14.7 10*3/uL — AB (ref 4.0–10.5)

## 2017-06-07 MED ORDER — FAMOTIDINE 20 MG PO TABS
20.0000 mg | ORAL_TABLET | Freq: Two times a day (BID) | ORAL | Status: DC
  Administered 2017-06-07 – 2017-06-09 (×5): 20 mg via ORAL
  Filled 2017-06-07 (×5): qty 1

## 2017-06-07 MED ORDER — ONDANSETRON HCL 4 MG PO TABS: 4.0000 mg | ORAL_TABLET | Freq: Four times a day (QID) | ORAL | 0 refills | Status: DC | PRN

## 2017-06-07 MED ORDER — DICLOFENAC SODIUM 1 % TD GEL
4.0000 g | Freq: Four times a day (QID) | TRANSDERMAL | Status: DC
  Administered 2017-06-07 – 2017-06-09 (×9): 4 g via TOPICAL
  Filled 2017-06-07: qty 100

## 2017-06-07 MED ORDER — ASPIRIN 81 MG PO CHEW: 81.0000 mg | CHEWABLE_TABLET | Freq: Two times a day (BID) | ORAL | 1 refills | Status: DC

## 2017-06-07 MED ORDER — GI COCKTAIL ~~LOC~~
30.0000 mL | Freq: Three times a day (TID) | ORAL | Status: DC | PRN
  Administered 2017-06-07 – 2017-06-09 (×4): 30 mL via ORAL
  Filled 2017-06-07 (×5): qty 30

## 2017-06-07 MED ORDER — DOCUSATE SODIUM 100 MG PO CAPS
100.0000 mg | ORAL_CAPSULE | Freq: Two times a day (BID) | ORAL | 0 refills | Status: DC
Start: 2017-06-07 — End: 2019-02-04

## 2017-06-07 MED ORDER — HYDROCODONE-ACETAMINOPHEN 5-325 MG PO TABS: 1.0000 | ORAL_TABLET | Freq: Four times a day (QID) | ORAL | 0 refills | Status: DC | PRN

## 2017-06-07 MED ORDER — SENNA 8.6 MG PO TABS: 2.0000 | ORAL_TABLET | Freq: Every day | ORAL | 0 refills | Status: DC

## 2017-06-07 NOTE — Discharge Summary (Signed)
Physician Discharge Summary  Patient ID: [name] MRN: [id number] DOB/AGE: 1965-09-03 51 y.o.  Admit date: 06/06/2017 Discharge date: 06/09/2017  Admission Diagnoses:  Osteoarthritis of right hip  Discharge Diagnoses:  Principal Problem:   Osteoarthritis of right hip Active Problems:   Atypical chest pain   Past Medical History:  Diagnosis Date  . Anemia   . Asthma   . Bursitis of left hip   . Decreased appetite   . GERD (gastroesophageal reflux disease)   . History of hiatal hernia    repaired  . History of stomach ulcers   . Hypertension   . Migraine    "maybe twice in the last 10 yrs" (10/16/2013)  . PONV (postoperative nausea and vomiting)   . Shortness of breath dyspnea     Surgeries: Procedure(s): RIGHT TOTAL HIP ARTHROPLASTY ANTERIOR APPROACH on 06/06/2017   Consultants (if any): Treatment Team:  (Rounding), Imts Vanita Ingles, MD  Discharged Condition: Improved  Hospital Course: [name] is an 51 y.o. female who was admitted 06/06/2017 with a diagnosis of Osteoarthritis of right hip and went to the operating room on 06/06/2017 and underwent the above named procedures.    She was given perioperative antibiotics:  Anti-infectives (From admission, onward)   Start     Dose/Rate Route Frequency Ordered Stop   06/07/17 0000  clindamycin (CLEOCIN) IVPB 600 mg     600 mg 100 mL/hr over 30 Minutes Intravenous Every 8 hours 06/06/17 2226 06/07/17 0609   06/06/17 2215  ceFAZolin (ANCEF) IVPB 2g/100 mL premix  Status:  Discontinued     2 g 200 mL/hr over 30 Minutes Intravenous Every 6 hours 06/06/17 2211 06/06/17 2229   06/06/17 1400  clindamycin (CLEOCIN) IVPB 900 mg     900 mg 100 mL/hr over 30 Minutes Intravenous To [facility] 06/03/17 1302 06/06/17 1620    .  Intraoperatively, she has a transient left bundle branch block. Postoperatively, she some some chest discomfort. She was evaluated by cardiology. Cardiac enzymes were negative. Echocardiogram  was performed. Cardiology felt her pain was coming from the chest wall, as it responded to topical voltaren.  The hospitalist service evaluated the patient for management of asthma.  She was given sequential compression devices, early ambulation, and ASA for DVT prophylaxis.  She benefited maximally from the hospital stay and there were no complications.    Recent vital signs:  Vitals:   06/09/17 1238 06/09/17 1500  BP:  140/63  Pulse: (!) 104 92  Resp: 18 16  Temp:  98.6 F (37 C)  SpO2: 100% 100%    Recent laboratory studies:  Lab Results  Component Value Date   HGB 8.5 (L) 06/09/2017   HGB 8.8 (L) 06/08/2017   HGB 10.9 (L) 06/07/2017   Lab Results  Component Value Date   WBC 7.8 06/09/2017   PLT 215 06/09/2017   Lab Results  Component Value Date   INR 1.09 08/04/2014   Lab Results  Component Value Date   NA 133 (L) 06/07/2017   K 3.8 06/07/2017   CL 99 (L) 06/07/2017   CO2 21 (L) 06/07/2017   BUN 11 06/07/2017   CREATININE 0.71 06/07/2017   GLUCOSE 215 (H) 06/07/2017    Discharge Medications:   Allergies as of 06/09/2017      Reactions   Amoxicillin Rash   PATIENT HAS HAD A PCN REACTION WITH IMMEDIATE RASH, FACIAL/TONGUE/THROAT SWELLING, SOB, OR LIGHTHEADEDNESS WITH HYPOTENSION:  #  #  #  YES  #  #  #  HAS PT DEVELOPED SEVERE RASH INVOLVING MUCUS MEMBRANES or SKIN NECROSIS: #  #  #  YES  #  #  #     "On my face" Has patient had a PCN reaction that required hospitalization: No Has patient had a PCN reaction occurring within the last 10 years:  #  #  #  UNKNOWN  #  #  #  .   Ampicillin Rash   PATIENT HAS HAD A PCN REACTION WITH IMMEDIATE RASH, FACIAL/TONGUE/THROAT SWELLING, SOB, OR LIGHTHEADEDNESS WITH HYPOTENSION: # # # YES # # #  HAS PT DEVELOPED SEVERE RASH INVOLVING MUCUS MEMBRANES or SKIN NECROSIS: # # # YES # # # "On my face"e Has patient had a PCN reaction that required hospitalization: No Has patient had a PCN reaction occurring within the last 10  years:  #  #  #  UNKNOWN  #  #  #    Mobic [meloxicam] Rash   Montelukast Sodium Rash   Phenergan [promethazine Hcl] Nausea And Vomiting      Medication List    STOP taking these medications   acetaminophen 500 MG tablet Commonly known as:  TYLENOL   MUSCLE RUB 10-15 % Crea   oxyCODONE-acetaminophen 10-325 MG tablet Commonly known as:  PERCOCET     TAKE these medications   albuterol 108 (90 Base) MCG/ACT inhaler Commonly known as:  PROVENTIL HFA;VENTOLIN HFA Inhale 2 puffs into the lungs every 6 (six) hours as needed for wheezing or shortness of breath.   albuterol (2.5 MG/3ML) 0.083% nebulizer solution Commonly known as:  PROVENTIL Take 3 mLs (2.5 mg total) by nebulization every 6 (six) hours as needed for wheezing or shortness of breath.   amLODipine 10 MG tablet Commonly known as:  NORVASC Take 1 tablet (10 mg total) by mouth daily.   aspirin 81 MG chewable tablet Chew 1 tablet (81 mg total) by mouth 2 (two) times daily.   budesonide-formoterol 80-4.5 MCG/ACT inhaler Commonly known as:  SYMBICORT INHALE TWO PUFFS BY MOUTH INTO LUNGS TWICE DAILY What changed:    how much to take  how to take this  when to take this  reasons to take this  additional instructions   cetirizine 10 MG tablet Commonly known as:  ZYRTEC Take 10 mg by mouth daily.   cholecalciferol 1000 units tablet Commonly known as:  VITAMIN D Take 1 tablet (1,000 Units total) by mouth daily.   diclofenac sodium 1 % Gel Commonly known as:  VOLTAREN Apply 4 g topically 4 (four) times daily.   docusate sodium 100 MG capsule Commonly known as:  COLACE Take 1 capsule (100 mg total) by mouth 2 (two) times daily.   ferrous sulfate 325 (65 FE) MG tablet Take 1 tablet (325 mg total) by mouth daily.   fluticasone 50 MCG/ACT nasal spray Commonly known as:  FLONASE USE TWO SPRAY(S) IN EACH NOSTRIL ONCE DAILY   HYDROcodone-acetaminophen 5-325 MG tablet Commonly known as:  NORCO/VICODIN Take  1-2 tablets by mouth every 6 (six) hours as needed (hip pain).   ibuprofen 200 MG tablet Commonly known as:  ADVIL,MOTRIN Take 800-1,000 mg by mouth every 6 (six) hours as needed (for pain.).   ondansetron 4 MG tablet Commonly known as:  ZOFRAN Take 1 tablet (4 mg total) by mouth every 6 (six) hours as needed for nausea.   senna 8.6 MG Tabs tablet Commonly known as:  SENOKOT Take 2 tablets (17.2 mg total) by mouth at bedtime.  Diagnostic Studies: Dg Pelvis Portable  Result Date: 06/06/2017 CLINICAL DATA:  RIGHT hip arthroplasty. EXAM: PORTABLE PELVIS 1-2 VIEWS COMPARISON:  MRI of the RIGHT hip January 26, 2017 FINDINGS: Status post recent RIGHT hip total arthroplasty with intact well-seated femoral and acetabular non sub cemented hardware. No fracture deformity. No dislocation. Shallow LEFT acetabula with mild osteoarthrosis. RIGHT hip soft tissue gas consistent with recent surgery. IMPRESSION: Status post RIGHT hip total arthroplasty with expected postoperative change. Electronically Signed   By: Elon Alas M.D.   On: 06/06/2017 19:27   Dg C-arm 61-120 Min  Result Date: 06/06/2017 CLINICAL DATA:  Right hip arthroplasty. EXAM: DG C-ARM 61-120 MIN; OPERATIVE RIGHT HIP WITH PELVIS COMPARISON:  None FLUOROSCOPY TIME:  18 seconds FINDINGS: Right total hip arthroplasty without failure or complication. Mild osteoarthritis of the left hip. IMPRESSION: Interval right total hip arthroplasty. Electronically Signed   By: Kathreen Devoid   On: 06/06/2017 19:45   Dg Hip Operative Unilat W Or W/o Pelvis Right  Result Date: 06/06/2017 CLINICAL DATA:  Right hip arthroplasty. EXAM: DG C-ARM 61-120 MIN; OPERATIVE RIGHT HIP WITH PELVIS COMPARISON:  None FLUOROSCOPY TIME:  18 seconds FINDINGS: Right total hip arthroplasty without failure or complication. Mild osteoarthritis of the left hip. IMPRESSION: Interval right total hip arthroplasty. Electronically Signed   By: Kathreen Devoid   On: 06/06/2017 19:45     Disposition: 06-Home-Health Care Svc  Discharge Instructions    Call MD / Call 911   Complete by:  As directed    If you experience chest pain or shortness of breath, CALL 911 and be transported to the hospital emergency room.  If you develope a fever above 101 F, pus (white drainage) or increased drainage or redness at the wound, or calf pain, call your surgeon's office.   Constipation Prevention   Complete by:  As directed    Drink plenty of fluids.  Prune juice may be helpful.  You may use a stool softener, such as Colace (over the counter) 100 mg twice a day.  Use MiraLax (over the counter) for constipation as needed.   Diet - low sodium heart healthy   Complete by:  As directed    Driving restrictions   Complete by:  As directed    No driving for 6 weeks   Increase activity slowly as tolerated   Complete by:  As directed    Lifting restrictions   Complete by:  As directed    No lifting for 6 weeks   TED hose   Complete by:  As directed    Use stockings (TED hose) for 2 weeks on both leg(s).  You may remove them at night for sleeping.      Follow-up Information    Akeya Ryther, Aaron Edelman, MD. Schedule an appointment as soon as possible for a visit in 2 week(s).   Specialty:  Orthopedic Surgery Why:  For wound re-check Contact information: [address]. [address] [place] [address] [phone]        [facility] Follow up.   Specialty:  [place] Why:  A representative from [facility] will contact you to arrange start date and time for your therapy.  Contact information: [address] [place] [address] [phone]            Signed: Hilton Cork Deontrey Massi 06/10/2017, 9:18 AM

## 2017-06-07 NOTE — Progress Notes (Signed)
   Subjective:  Patient reports pain as mild to moderate.  C/o R hip soreness and mild SOB. Cardiology saw patient for transient LBB and CP.  Objective:   VITALS:   Vitals:   06/06/17 2154 06/07/17 0409 06/07/17 0825 06/07/17 1219  BP: (!) 148/73 (!) 141/79 (!) 144/85 (!) 144/73  Pulse: 86 98 (!) 106 94  Resp: 18 17    Temp: 98 F (36.7 C) 98.1 F (36.7 C) 97.8 F (36.6 C) 98.4 F (36.9 C)  TempSrc: Oral Oral Oral Oral  SpO2: 100% 100% 100% 100%  Weight: 77.1 kg (170 lb)     Height: 5\' 1"  (1.549 m)       NAD ABD soft Sensation intact distally Intact pulses distally Dorsiflexion/Plantar flexion intact Incision: dressing C/D/I Compartment soft   Lab Results  Component Value Date   WBC 14.7 (H) 06/07/2017   HGB 10.9 (L) 06/07/2017   HCT 33.5 (L) 06/07/2017   MCV 84.0 06/07/2017   PLT 271 06/07/2017   BMET    Component Value Date/Time   NA 133 (L) 06/07/2017 0333   NA 141 09/28/2016 1012   K 3.8 06/07/2017 0333   CL 99 (L) 06/07/2017 0333   CO2 21 (L) 06/07/2017 0333   GLUCOSE 215 (H) 06/07/2017 0333   BUN 11 06/07/2017 0333   BUN 12 09/28/2016 1012   CREATININE 0.71 06/07/2017 0333   CREATININE 0.58 08/12/2014 0923   CALCIUM 9.1 06/07/2017 0333   GFRNONAA >60 06/07/2017 0333   GFRAA >60 06/07/2017 0333     Assessment/Plan: 1 Day Post-Op   Principal Problem:   Osteoarthritis of right hip Active Problems:   Atypical chest pain   WBAT with walker  DVT ppx: ASA, SCDs, TEDs PO pain control CP: appreciate cardiology eval, echo pending Asthma: symptoms persist despite home meds, will consult hospitalist  PT/OT Dispo: D/C home with HEP when medical issues resolved, likely tomorrow   Hilton Cork Adric Wrede 06/07/2017, 12:54 PM   Rod Can, MD Cell [phone]

## 2017-06-07 NOTE — Progress Notes (Signed)
MD states to order an EKG and to call Echo to determine if pt can moved to an earlier time.

## 2017-06-07 NOTE — Progress Notes (Signed)
  Echocardiogram 2D Echocardiogram has been performed.  [name] 06/07/2017, 2:37 PM

## 2017-06-07 NOTE — Progress Notes (Signed)
Progress Note  Patient Name: [name] Date of Encounter: 06/07/2017  Primary Cardiologist: Dr Aundra Dubin  Subjective   Mild dyspnea and chest tightness with inspiration  Inpatient Medications    Scheduled Meds: . amLODipine  10 mg Oral Daily  . aspirin  81 mg Oral BID  . docusate sodium  100 mg Oral BID  . loratadine  10 mg Oral Daily  . mometasone-formoterol  2 puff Inhalation BID  . senna  2 tablet Oral QHS   Continuous Infusions: . sodium chloride 150 mL/hr at 06/06/17 2257  . methocarbamol (ROBAXIN)  IV Stopped (06/06/17 2258)   PRN Meds: acetaminophen **OR** acetaminophen, albuterol, alum & mag hydroxide-simeth, diphenhydrAMINE, HYDROcodone-acetaminophen, HYDROcodone-acetaminophen, HYDROmorphone (DILAUDID) injection, ipratropium-albuterol, menthol-cetylpyridinium **OR** phenol, methocarbamol **OR** methocarbamol (ROBAXIN)  IV, metoCLOPramide **OR** metoCLOPramide (REGLAN) injection, ondansetron **OR** ondansetron (ZOFRAN) IV, polyethylene glycol   Vital Signs    Vitals:   06/06/17 2154 06/07/17 0409 06/07/17 0825 06/07/17 1219  BP: (!) 148/73 (!) 141/79 (!) 144/85 (!) 144/73  Pulse: 86 98 (!) 106 94  Resp: 18 17    Temp: 98 F (36.7 C) 98.1 F (36.7 C) 97.8 F (36.6 C) 98.4 F (36.9 C)  TempSrc: Oral Oral Oral Oral  SpO2: 100% 100% 100% 100%  Weight: 170 lb (77.1 kg)     Height: 5\' 1"  (1.549 m)       Intake/Output Summary (Last 24 hours) at 06/07/2017 1239 Last data filed at 06/07/2017 0600 Gross per 24 hour  Intake 2730 ml  Output 1500 ml  Net 1230 ml   Filed Weights   06/06/17 1245 06/06/17 2154  Weight: 170 lb (77.1 kg) 170 lb (77.1 kg)    Telemetry    Sinus tachy- Personally Reviewed   Physical Exam   GEN: No acute distress.   Neck: No JVD Cardiac: RRR, no murmurs, rubs, or gallops.  Respiratory: Minimal expiratory wheeze GI: Soft, nontender, non-distended  MS: s/p hip replacement Neuro:  Nonfocal  Psych: Normal affect   Labs      Chemistry Recent Labs  Lab 06/01/17 0943 06/07/17 0333  NA 135 133*  K 3.6 3.8  CL 105 99*  CO2 24 21*  GLUCOSE 132* 215*  BUN 10 11  CREATININE 0.51 0.71  CALCIUM 9.4 9.1  GFRNONAA >60 >60  GFRAA >60 >60  ANIONGAP 6 13     Hematology Recent Labs  Lab 06/01/17 0943 06/07/17 0333  WBC 5.7 14.7*  RBC 4.12 3.99  HGB 11.5* 10.9*  HCT 34.5* 33.5*  MCV 83.7 84.0  MCH 27.9 27.3  MCHC 33.3 32.5  RDW 15.8* 15.8*  PLT 220 271    Cardiac Enzymes Recent Labs  Lab 06/06/17 2126 06/06/17 2208 06/07/17 0333 06/07/17 1005  TROPONINI <0.03 <0.03 <0.03 <0.03    Radiology    Dg Pelvis Portable  Result Date: 06/06/2017 CLINICAL DATA:  RIGHT hip arthroplasty. EXAM: PORTABLE PELVIS 1-2 VIEWS COMPARISON:  MRI of the RIGHT hip January 26, 2017 FINDINGS: Status post recent RIGHT hip total arthroplasty with intact well-seated femoral and acetabular non sub cemented hardware. No fracture deformity. No dislocation. Shallow LEFT acetabula with mild osteoarthrosis. RIGHT hip soft tissue gas consistent with recent surgery. IMPRESSION: Status post RIGHT hip total arthroplasty with expected postoperative change. Electronically Signed   By: Elon Alas M.D.   On: 06/06/2017 19:27   Dg C-arm 61-120 Min  Result Date: 06/06/2017 CLINICAL DATA:  Right hip arthroplasty. EXAM: DG C-ARM 61-120 MIN; OPERATIVE RIGHT HIP WITH PELVIS COMPARISON:  None FLUOROSCOPY TIME:  18 seconds FINDINGS: Right total hip arthroplasty without failure or complication. Mild osteoarthritis of the left hip. IMPRESSION: Interval right total hip arthroplasty. Electronically Signed   By: Kathreen Devoid   On: 06/06/2017 19:45   Dg Hip Operative Unilat W Or W/o Pelvis Right  Result Date: 06/06/2017 CLINICAL DATA:  Right hip arthroplasty. EXAM: DG C-ARM 61-120 MIN; OPERATIVE RIGHT HIP WITH PELVIS COMPARISON:  None FLUOROSCOPY TIME:  18 seconds FINDINGS: Right total hip arthroplasty without failure or complication. Mild  osteoarthritis of the left hip. IMPRESSION: Interval right total hip arthroplasty. Electronically Signed   By: Kathreen Devoid   On: 06/06/2017 19:45     Patient Profile     51 yo with history of HTN and asthma had transient LBBB and chest pain in OR today after THR.      Assessment & Plan    1. Chest pain: Symptoms atypical; enzymes negative; await echo; if normal, no plans for further cardiac eval. CP seems most c/w asthma (pleuritic).  2. HTN: BP mildly elevated. Continue amlodipine and follow; some of increased BP likely driven by pain.   3. Asthma: continue bronchodilators. 4. Post-THR: Per surgery.     For questions or updates, please contact [facility] Please consult [email or web address] for contact info under Cardiology/STEMI.      Signed, Kirk Ruths, MD  06/07/2017, 12:39 PM

## 2017-06-07 NOTE — Progress Notes (Signed)
Pt states she is having chest pains. Pt's vitals are 144/85,  HR 106, and SpO2 100% on 2L. MD notified.

## 2017-06-07 NOTE — Consult Note (Signed)
Date: 06/07/2017               Patient Name:  [name] MRN: [id number]  DOB: Jan 16, 1966 Age / Sex: 51 y.o., female   PCP: Lorella Nimrod, MD         Requesting Physician: Dr. Rod Can, MD    Consulting Reason:  Chest pain     Chief Complaint: chest pain and soreness  History of Present Illness: 50 year old female with past medical history of asthma, diverticulitis, severe osteoarthritis of right hip, GERD w/hiatal hernia.  We are consulted due to patient having chest pain and mild shortness of breath immediately post right hip replacement surgery on 06/06/17.  Her pain was pleuritic and reproducible, nonradiating and was atypical for ACS.  She was having transient left bundle branch block in the operating room as well and cardiology was consulted.   During our interview today patient reiterates having some shortness of breath, pleuritic chest pain, also mentions some numbness around her jaw just after her hip replacement procedure.  She also felt her heart was beating very fast and she had a headache.  She felt this was better shortly after but still had some pain with deep breaths.  She was given a nebulizer treatment with albuterol and was unsure how much that helped.   Later she felt as if she had more GERD type symptoms she was belching, felt a burning sensation, she asked for some Mylanta which she said didn't help all that much, at home she uses baking soda with water.  She denies any abdominal pain, nausea, vomiting, diarrhea, fever.    Meds: Current Facility-Administered Medications  Medication Dose Route Frequency Provider Last Rate Last Dose  . 0.9 %  sodium chloride infusion   Intravenous Continuous Rod Can, MD 150 mL/hr at 06/06/17 2257    . acetaminophen (TYLENOL) tablet 650 mg  650 mg Oral Q4H PRN Swinteck, Aaron Edelman, MD       Or  . acetaminophen (TYLENOL) suppository 650 mg  650 mg Rectal Q4H PRN Swinteck, Aaron Edelman, MD      . albuterol (PROVENTIL) (2.5 MG/3ML)  0.083% nebulizer solution 2.5 mg  2.5 mg Nebulization Q6H PRN Swinteck, Aaron Edelman, MD      . amLODipine (NORVASC) tablet 10 mg  10 mg Oral Daily Rod Can, MD   10 mg at 06/07/17 7902  . aspirin chewable tablet 81 mg  81 mg Oral BID Rod Can, MD   81 mg at 06/07/17 4097  . diclofenac sodium (VOLTAREN) 1 % transdermal gel 4 g  4 g Topical QID Alphonzo Grieve, MD   4 g at 06/07/17 1554  . diphenhydrAMINE (BENADRYL) 12.5 MG/5ML elixir 12.5-25 mg  12.5-25 mg Oral Q4H PRN Rod Can, MD   12.5 mg at 06/06/17 2320  . docusate sodium (COLACE) capsule 100 mg  100 mg Oral BID Rod Can, MD   100 mg at 06/07/17 3532  . famotidine (PEPCID) tablet 20 mg  20 mg Oral BID Alphonzo Grieve, MD   20 mg at 06/07/17 1512  . gi cocktail (Maalox,Lidocaine,Donnatal)  30 mL Oral TID PRN Alphonzo Grieve, MD   30 mL at 06/07/17 1512  . HYDROcodone-acetaminophen (NORCO/VICODIN) 5-325 MG per tablet 1 tablet  1 tablet Oral Q4H PRN Swinteck, Aaron Edelman, MD      . HYDROcodone-acetaminophen (NORCO/VICODIN) 5-325 MG per tablet 2 tablet  2 tablet Oral Q4H PRN Rod Can, MD   2 tablet at 06/07/17 1304  . HYDROmorphone (DILAUDID) injection 0.5-2 mg  0.5-2 mg Intravenous Q2H PRN Rod Can, MD   2 mg at 06/07/17 0521  . ipratropium-albuterol (DUONEB) 0.5-2.5 (3) MG/3ML nebulizer solution 3 mL  3 mL Nebulization Q4H PRN Larey Dresser, MD   3 mL at 06/07/17 1315  . loratadine (CLARITIN) tablet 10 mg  10 mg Oral Daily Rod Can, MD   10 mg at 06/07/17 8416  . menthol-cetylpyridinium (CEPACOL) lozenge 3 mg  1 lozenge Oral PRN Swinteck, Aaron Edelman, MD       Or  . phenol (CHLORASEPTIC) mouth spray 1 spray  1 spray Mouth/Throat PRN Swinteck, Aaron Edelman, MD      . methocarbamol (ROBAXIN) tablet 500 mg  500 mg Oral Q6H PRN Swinteck, Aaron Edelman, MD       Or  . methocarbamol (ROBAXIN) 500 mg in dextrose 5 % 50 mL IVPB  500 mg Intravenous Q6H PRN Rod Can, MD   Stopped at 06/06/17 2258  . metoCLOPramide (REGLAN) tablet  5-10 mg  5-10 mg Oral Q8H PRN Swinteck, Aaron Edelman, MD       Or  . metoCLOPramide (REGLAN) injection 5-10 mg  5-10 mg Intravenous Q8H PRN Rod Can, MD   10 mg at 06/06/17 2257  . mometasone-formoterol (DULERA) 100-5 MCG/ACT inhaler 2 puff  2 puff Inhalation BID Rod Can, MD   2 puff at 06/07/17 0929  . ondansetron (ZOFRAN) tablet 4 mg  4 mg Oral Q6H PRN Swinteck, Aaron Edelman, MD       Or  . ondansetron (ZOFRAN) injection 4 mg  4 mg Intravenous Q6H PRN Rod Can, MD   4 mg at 06/07/17 0415  . polyethylene glycol (MIRALAX / GLYCOLAX) packet 17 g  17 g Oral Daily PRN Swinteck, Aaron Edelman, MD      . senna (SENOKOT) tablet 17.2 mg  2 tablet Oral QHS Rod Can, MD        Allergies: Allergies as of 05/18/2017 - Review Complete 03/01/2017  Allergen Reaction Noted  . Amoxicillin Rash 10/15/2013  . Ampicillin Rash 05/24/2011  . Mobic [meloxicam] Rash 01/23/2016  . Montelukast sodium Rash 05/24/2011   Past Medical History:  Diagnosis Date  . Anemia   . Asthma   . Bursitis of left hip   . Decreased appetite   . GERD (gastroesophageal reflux disease)   . History of hiatal hernia    repaired  . History of stomach ulcers   . Hypertension   . Migraine    "maybe twice in the last 10 yrs" (10/16/2013)  . PONV (postoperative nausea and vomiting)   . Shortness of breath dyspnea    Past Surgical History:  Procedure Laterality Date  . BARTHOLIN GLAND CYST EXCISION    . BREAST LUMPECTOMY WITH RADIOACTIVE SEED LOCALIZATION Left 01/23/2016   Procedure: BREAST LUMPECTOMY WITH RADIOACTIVE SEED LOCALIZATION;  Surgeon: Jackolyn Confer, MD;  Location: [facility];  Service: General;  Laterality: Left;  . CESAREAN SECTION  1989  . CHOLECYSTECTOMY  ~ 2000  . EPIGASTRIC HERNIA REPAIR  07/2003  . HERNIA REPAIR    . INCISION AND DRAINAGE ABSCESS  10/2005   Bartholin abscess.  . TONSILLECTOMY AND ADENOIDECTOMY  1983  . TUBAL LIGATION  1994   Family History  Problem Relation Age of Onset  . Cancer  Mother 43       unknown  . Heart attack Father 35  . Heart attack Sister 71  . Diabetes Sister    Father: diabetes, died of MI at 5 long standing CAD Mother: Diabetes Sister: pacemaker, died at age  39 of MI   Social History   Socioeconomic History  . Marital status: Single    Spouse name: Not on file  . Number of children: Not on file  . Years of education: Not on file  . Highest education level: Not on file  Social Needs  . Financial resource strain: Not on file  . Food insecurity - worry: Not on file  . Food insecurity - inability: Not on file  . Transportation needs - medical: Not on file  . Transportation needs - non-medical: Not on file  Occupational History  . Occupation: residential worker  Tobacco Use  . Smoking status: Former Smoker    Years: 1.00    Types: Cigarettes    Last attempt to quit: 06/01/2014    Years since quitting: 3.0  . Smokeless tobacco: Never Used  Substance and Sexual Activity  . Alcohol use: Yes    Alcohol/week: 0.0 oz    Comment: socially  . Drug use: No  . Sexual activity: Yes  Other Topics Concern  . Not on file  Social History Narrative  . Not on file   Social history verified, personally reviewed with pt  Review of Systems: Pertinent items noted in HPI and remainder of comprehensive ROS otherwise negative.  Physical Exam: Blood pressure (!) 144/73, pulse 94, temperature 98.4 F (36.9 C), temperature source Oral, resp. rate 17, height 5\' 1"  (1.549 m), weight 170 lb (77.1 kg), last menstrual period 05/09/2017, SpO2 100 %. BP (!) 144/73 (BP Location: Left Arm)   Pulse 94   Temp 98.4 F (36.9 C) (Oral)   Resp 17   Ht 5\' 1"  (1.700 m)   Wt 170 lb (77.1 kg)   LMP 05/09/2017   SpO2 100%   BMI 32.12 kg/m   Physical Exam  Constitutional: She is oriented to person, place, and time. No distress.  HENT:  Head: Normocephalic and atraumatic.  Neck: No JVD present.  Cardiovascular: Normal rate, regular rhythm and normal heart  sounds. PMI is displaced (to right). Exam reveals no gallop and no friction rub.  No murmur heard. Chest pain with deep inspiration, also reproducible on palpation  Pulmonary/Chest: Effort normal and breath sounds normal. No respiratory distress. She has no wheezes. She has no rales. She exhibits no tenderness.  Abdominal: Soft. Bowel sounds are normal. She exhibits no distension and no mass. There is no tenderness. There is no rebound and no guarding.  Musculoskeletal: She exhibits no edema.  Neurological: She is alert and oriented to person, place, and time.  Skin: Skin is warm and dry. She is not diaphoretic.  Psychiatric: She has a normal mood and affect.   Lab results: Lab Results  Component Value Date   TROPONINI <0.03 06/07/2017    Imaging results:  Dg Pelvis Portable  Result Date: 06/06/2017 CLINICAL DATA:  RIGHT hip arthroplasty. EXAM: PORTABLE PELVIS 1-2 VIEWS COMPARISON:  MRI of the RIGHT hip January 26, 2017 FINDINGS: Status post recent RIGHT hip total arthroplasty with intact well-seated femoral and acetabular non sub cemented hardware. No fracture deformity. No dislocation. Shallow LEFT acetabula with mild osteoarthrosis. RIGHT hip soft tissue gas consistent with recent surgery. IMPRESSION: Status post RIGHT hip total arthroplasty with expected postoperative change. Electronically Signed   By: Elon Alas M.D.   On: 06/06/2017 19:27   Dg C-arm 61-120 Min  Result Date: 06/06/2017 CLINICAL DATA:  Right hip arthroplasty. EXAM: DG C-ARM 61-120 MIN; OPERATIVE RIGHT HIP WITH PELVIS COMPARISON:  None FLUOROSCOPY TIME:  18  seconds FINDINGS: Right total hip arthroplasty without failure or complication. Mild osteoarthritis of the left hip. IMPRESSION: Interval right total hip arthroplasty. Electronically Signed   By: Kathreen Devoid   On: 06/06/2017 19:45   Dg Hip Operative Unilat W Or W/o Pelvis Right  Result Date: 06/06/2017 CLINICAL DATA:  Right hip arthroplasty. EXAM: DG C-ARM  61-120 MIN; OPERATIVE RIGHT HIP WITH PELVIS COMPARISON:  None FLUOROSCOPY TIME:  18 seconds FINDINGS: Right total hip arthroplasty without failure or complication. Mild osteoarthritis of the left hip. IMPRESSION: Interval right total hip arthroplasty. Electronically Signed   By: Kathreen Devoid   On: 06/06/2017 19:45    Other results: EKG: normal EKG, normal sinus rhythm, personally reviewed  Assessment, Plan, & Recommendations by Problem: Principal Problem:   Osteoarthritis of right hip Active Problems:   Atypical chest pain   Atypical Chest pain: Agree that chest pain is atypical for angina but new LBBB and family history is concerning. Cardiology following  -Troponins negative 4 -ECG's following procedure have been sinus rhythm, normal -ECHO done today showed EF of 65-70% w/no regional wall motion abnormalities, normal diastolic function, mild-moderate Tricuspid Regurgitation.  Asthma: chronic, mild, stable only using albuterol 1-2x per week at home.    -Lung exam clear but already had one breathing treatment -PRN duonebs ever 4 hours  HTN: chronic, pt on 10mg  of amlodipine  -continue amlodipine  Osteoarthritis of R hip  -successful R total hip replacement -pt working well with PT -pain well controlled   Dispo: Disposition is deferred at this time, awaiting improvement of current medical problems. Anticipated discharge in approximately 2-3 day(s).   The patient does have a current PCP Reesa Chew, Soundra Pilon, MD) and does need an [facility] hospital follow-up appointment after discharge.  The patient does not have transportation limitations that hinder transportation to clinic appointments.  Signed: Katherine Roan, MD 06/07/2017, 5:20 PM  Vickki Muff MD PGY-1 Internal Medicine Pager # [phone]  After Hours (After 5p/  First Contact Pager: [phone]  weekends / holidays): Second Contact Pager: [phone]

## 2017-06-07 NOTE — Progress Notes (Signed)
PT Cancellation Note  Patient Details Name: [name] MRN: [id number] DOB: Apr 03, 1966   Cancelled Treatment:     attempting to evaluate patient at [place]. Per RN, patient c/o of chest pain again and will be notifying MD. Per cardiology documentation, echo being arranged this morning. Will continue to follow and re-attempt today pending results.   Reinaldo Berber, PT, DPT Acute Rehab Services Pager: [phone]     Reinaldo Berber 06/07/2017, 8:34 AM

## 2017-06-07 NOTE — Progress Notes (Signed)
Pt states her jaw is feeling numb. RN notified MD. MD states he is having a hospitalist look at this pt.

## 2017-06-07 NOTE — Evaluation (Signed)
Physical Therapy Evaluation Patient Details Name: [name] MRN: [id number] DOB: [birth year] Today's Date: 06/07/2017   History of Present Illness  51 y.o. female s/p R THA (Anterior) 12/3  PMH includes: Asthma (chronic), Obesity, Anemia, Prediabetes, GERD, HTN, Dyspnea. Currently with c/o of Chest Pain     Clinical Impression  Patient is s/p above surgery resulting in functional limitations due to the deficits listed below (see PT Problem List). PTA, patient was mod I with all mobility, living in a 1 story home with 2 stairs to enter. Pt lives with brother who is avail PRN and has friends to assist first few days after leaving hospital. Upon eval, patient presenting with post op pain and weakness, as well as c/o of chest pain and dyspnea that are limiting her activity at this time.  Currently min guard level with transfers out of bed, session focused on therex and transfers.  Hoping to progress OOB mobility in subsequent visits. Patient will benefit from skilled PT to increase their independence and safety with mobility to allow discharge to the venue listed below.       Follow Up Recommendations Home health PT;DC plan and follow up therapy as arranged by surgeon;Supervision/Assistance - 24 hour    Equipment Recommendations  Rolling walker with 5" wheels;3in1 (PT)    Recommendations for Other Services OT consult     Precautions / Restrictions Precautions Precautions: Fall Precaution Comments: reviewed supine therex Restrictions Weight Bearing Restrictions: Yes RLE Weight Bearing: Weight bearing as tolerated      Mobility  Bed Mobility Overal bed mobility: Needs Assistance Bed Mobility: Supine to Sit;Sit to Supine     Supine to sit: Modified independent (Device/Increase time) Sit to supine: Min assist   General bed mobility comments: Min A to assist RLE into bed.   Transfers Overall transfer level: Needs assistance Equipment used: Rolling walker (2 wheeled) Transfers:  Sit to/from [facility] Sit to Stand: Min guard Stand pivot transfers: Min guard       General transfer comment: Min guard for safety, cues for safe hand placment and sequencing with RW.   Ambulation/Gait                Stairs            Wheelchair Mobility    Modified Rankin (Stroke Patients Only)       Balance Overall balance assessment: Needs assistance Sitting-balance support: Feet unsupported;No upper extremity supported Sitting balance-Leahy Scale: Fair     Standing balance support: During functional activity;Bilateral upper extremity supported Standing balance-Leahy Scale: Poor Standing balance comment: reliant on UE support at this time for standing balance. c/o of dizziness at rest                              Pertinent Vitals/Pain Pain Assessment: 0-10 Pain Score: 7  Pain Location: R hip, Chest  Pain Descriptors / Indicators: Dull;Aching;Operative site guarding;Grimacing Pain Intervention(s): Limited activity within patient's tolerance;Monitored during session    [place] expects to be discharged to:: Private residence Living Arrangements: Other relatives(Brother) Available Help at Discharge: Friend(s);Family;Available PRN/intermittently(Friends staying most of the day. Brother truck Geophysicist/field seismologist in/out) Type of Home: House Home Access: Stairs to enter Entrance Stairs-Rails: None Technical brewer of Steps: 2 Home Layout: One level Home Equipment: Toilet riser      Prior Function Level of Independence: Independent with assistive device(s)         Comments: Mod  I for all mobility.     Hand Dominance        Extremity/Trunk Assessment   Upper Extremity Assessment Upper Extremity Assessment: Overall WFL for tasks assessed;Defer to OT evaluation    Lower Extremity Assessment Lower Extremity Assessment: (RLE 3/5 c/w post op, LLE 4/5 gross)    Cervical / Trunk Assessment Cervical /  Trunk Assessment: Normal  Communication   Communication: No difficulties  Cognition Arousal/Alertness: Awake/alert;Lethargic Behavior During Therapy: WFL for tasks assessed/performed Overall Cognitive Status: Within Functional Limits for tasks assessed                                 General Comments: patient reports feeling far more lethargic today than baseline.      General Comments General comments (skin integrity, edema, etc.): Session limited by fatigue, dizziness, chest pain. No Nystgamus observed with eye testing. SpO2=>95% on 2L of O2, BP: 144/72 at rest. HR-90-110 during session. Patient reports no c/o of numb jaw and lower face, reported to RN who notified MD.     Exercises Total Joint Exercises Ankle Circles/Pumps: AROM;Both;20 reps Quad Sets: AROM;Both;15 reps Hip ABduction/ADduction: AROM;Both;10 reps   Assessment/Plan    PT Assessment Patient needs continued PT services  PT Problem List Decreased strength;Decreased range of motion;Decreased activity tolerance;Decreased balance;Decreased mobility;Decreased coordination;Decreased cognition;Pain       PT Treatment Interventions DME instruction;Gait training;Stair training;Functional mobility training;Therapeutic activities;Therapeutic exercise    PT Goals (Current goals can be found in the Care Plan section)  Acute Rehab PT Goals Patient Stated Goal: return home with HHPT PT Goal Formulation: With patient Time For Goal Achievement: 06/21/17 Potential to Achieve Goals: Good    Frequency 7X/week   Barriers to discharge        Co-evaluation               AM-PAC PT "6 Clicks" Daily Activity  Outcome Measure Difficulty turning over in bed (including adjusting bedclothes, sheets and blankets)?: Unable Difficulty moving from lying on back to sitting on the side of the bed? : Unable Difficulty sitting down on and standing up from a chair with arms (e.g., wheelchair, bedside commode, etc,.)?:  Unable Help needed moving to and from a bed to chair (including a wheelchair)?: A Little Help needed walking in hospital room?: A Little Help needed climbing 3-5 steps with a railing? : A Lot 6 Click Score: 11    End of Session Equipment Utilized During Treatment: Gait belt;Oxygen Activity Tolerance: Patient limited by fatigue;Patient limited by lethargy Patient left: in bed;with call bell/phone within reach Nurse Communication: Mobility status;Other (comment)(new c/o of jaw and face numbness) PT Visit Diagnosis: Unsteadiness on feet (R26.81);Other abnormalities of gait and mobility (R26.89);Repeated falls (R29.6);Muscle weakness (generalized) (M62.81);Pain Pain - Right/Left: Right Pain - part of body: Hip(chest )    Time: 1215-1300 PT Time Calculation (min) (ACUTE ONLY): 45 min   Charges:     PT Treatments $Therapeutic Exercise: 8-22 mins $Therapeutic Activity: 8-22 mins   PT G Codes:        Reinaldo Berber, PT, DPT Acute Rehab Services Pager: [phone]    Reinaldo Berber 06/07/2017, 1:49 PM

## 2017-06-07 NOTE — Progress Notes (Signed)
RN left voice message with Echo regarding pt's scheduled time.

## 2017-06-07 NOTE — Progress Notes (Signed)
RN notified MD that EKG results showed NSR. MD states she will be by to see pt and that she will call Echo too.

## 2017-06-07 NOTE — Progress Notes (Signed)
PT Cancellation Note  Patient Details Name: [name] MRN: [id number] DOB: [birth year]   Cancelled Treatment:     pt refused to work second session today citing exhaustion. Reinforced therex and encouraged her to get OOB with nursing this evening.   Reinaldo Berber, PT, DPT Acute Rehab Services Pager: [phone]     Reinaldo Berber 06/07/2017, 3:57 PM

## 2017-06-07 NOTE — Care Management Note (Signed)
Case Management Note  Patient Details  Name: [name] MRN: [id number] Date of Birth: 1966-01-22  Subjective/Objective:   51 yr old female s/p right total hip arthroplasty.                Action/Plan:  Per MD directions patient will do Home Exercise program at discharge. She has all needed DME, will have support at discharge.    Expected Discharge Date:   06/08/17               Expected Discharge Plan:  Home/Self Care  In-House Referral:     Discharge planning Services  CM Consult  Post Acute Care Choice:  NA Choice offered to:  NA  DME Arranged:  (Has RW and 3in1) DME Agency:  NA  HH Arranged:  NA(Patient will do HEP (Home Exercise Program)) [facility] Agency:  NA  Status of Service:  Completed, signed off  If discussed at [place] of Stay Meetings, dates discussed:    Additional Comments:  Ninfa Meeker, RN 06/07/2017, 12:31 PM

## 2017-06-08 ENCOUNTER — Encounter (HOSPITAL_COMMUNITY): Payer: Self-pay | Admitting: General Practice

## 2017-06-08 LAB — CBC
HCT: 27 % — ABNORMAL LOW (ref 36.0–46.0)
Hemoglobin: 8.8 g/dL — ABNORMAL LOW (ref 12.0–15.0)
MCH: 27.2 pg (ref 26.0–34.0)
MCHC: 32.6 g/dL (ref 30.0–36.0)
MCV: 83.3 fL (ref 78.0–100.0)
PLATELETS: 225 10*3/uL (ref 150–400)
RBC: 3.24 MIL/uL — ABNORMAL LOW (ref 3.87–5.11)
RDW: 15.8 % — AB (ref 11.5–15.5)
WBC: 10.7 10*3/uL — AB (ref 4.0–10.5)

## 2017-06-08 MED ORDER — DICLOFENAC SODIUM 1 % TD GEL: 4.0000 g | Freq: Four times a day (QID) | TRANSDERMAL | 3 refills | Status: DC

## 2017-06-08 NOTE — Progress Notes (Signed)
Physical Therapy Treatment Patient Details Name: [name] MRN: [id number] DOB: 1965-08-14 Today's Date: 06/08/2017    History of Present Illness 51 y.o. female s/p R THA (Anterior) 12/3  PMH includes: Asthma (chronic), Obesity, Anemia, Prediabetes, GERD, HTN, Dyspnea. Currently with c/o of Chest Pain     PT Comments    PM session focused on reinforcing therex and improving quality of transfers and short distance ambulation. Patient reports c/o of chest pain prior to session and was somewhat limited again this evening, RN aware. Discussed importance of progression tomorrow and stair training. Pt agreeable to plan.   Follow Up Recommendations  Home health PT;DC plan and follow up therapy as arranged by surgeon;Supervision/Assistance - 24 hour     Equipment Recommendations  Rolling walker with 5" wheels;3in1 (PT)    Recommendations for Other Services OT consult     Precautions / Restrictions Precautions Precautions: Fall Precaution Comments: reviewed supine therex, chest pain  Restrictions Weight Bearing Restrictions: Yes RLE Weight Bearing: Weight bearing as tolerated    Mobility  Bed Mobility Overal bed mobility: Needs Assistance Bed Mobility: Supine to Sit;Sit to Supine     Supine to sit: Modified independent (Device/Increase time) Sit to supine: Min assist   General bed mobility comments: Min A to assist RLE into bed.   Transfers Overall transfer level: Needs assistance Equipment used: Rolling walker (2 wheeled) Transfers: Sit to/from [facility] Sit to Stand: Min guard Stand pivot transfers: Min guard       General transfer comment: Min guard for safety, cues for safe hand placment and sequencing with RW.   Ambulation/Gait Ambulation/Gait assistance: Min guard Ambulation Distance (Feet): 30 Feet Assistive device: Rolling walker (2 wheeled) Gait Pattern/deviations: Step-to pattern;Decreased stride length;Decreased step length -  right;Antalgic Gait velocity: decreased   General Gait Details: Patient still struggling to advancing RLE at this time. cues for step length. min guard for safety, ambulating to bathroom and back and aroudn hospital room beore returning to room.    Stairs            Wheelchair Mobility    Modified Rankin (Stroke Patients Only)       Balance Overall balance assessment: Needs assistance Sitting-balance support: Feet unsupported;No upper extremity supported Sitting balance-Leahy Scale: Fair     Standing balance support: During functional activity;Bilateral upper extremity supported Standing balance-Leahy Scale: Poor Standing balance comment: reliant on UE support at this time for standing balance. c/o of dizziness at rest                             Cognition Arousal/Alertness: Awake/alert Behavior During Therapy: WFL for tasks assessed/performed Overall Cognitive Status: Within Functional Limits for tasks assessed                                        Exercises Total Joint Exercises Ankle Circles/Pumps: AROM;Both;20 reps Quad Sets: AROM;Both;15 reps Hip ABduction/ADduction: AROM;Both;10 reps Marching in Standing: AROM;Right;10 reps Standing Hip Extension: Right;10 reps;AROM    General Comments        Pertinent Vitals/Pain Pain Assessment: 0-10 Pain Score: 7  Pain Location: R hip, Chest  Pain Descriptors / Indicators: Dull;Aching;Operative site guarding;Grimacing Pain Intervention(s): Limited activity within patient's tolerance;Monitored during session;Premedicated before session    Home Living  Prior Function            PT Goals (current goals can now be found in the care plan section) Acute Rehab PT Goals Patient Stated Goal: return home with HHPT PT Goal Formulation: With patient Time For Goal Achievement: 06/21/17 Potential to Achieve Goals: Good Progress towards PT goals: Progressing toward  goals    Frequency    7X/week      PT Plan Current plan remains appropriate    Co-evaluation              AM-PAC PT "6 Clicks" Daily Activity  Outcome Measure  Difficulty turning over in bed (including adjusting bedclothes, sheets and blankets)?: A Little Difficulty moving from lying on back to sitting on the side of the bed? : A Little Difficulty sitting down on and standing up from a chair with arms (e.g., wheelchair, bedside commode, etc,.)?: A Little Help needed moving to and from a bed to chair (including a wheelchair)?: A Little Help needed walking in hospital room?: A Little Help needed climbing 3-5 steps with a railing? : A Lot 6 Click Score: 17    End of Session Equipment Utilized During Treatment: Gait belt;Oxygen Activity Tolerance: Patient limited by fatigue;Patient limited by lethargy Patient left: in bed;with call bell/phone within reach Nurse Communication: Mobility status;Other (comment) PT Visit Diagnosis: Unsteadiness on feet (R26.81);Other abnormalities of gait and mobility (R26.89);Repeated falls (R29.6);Muscle weakness (generalized) (M62.81);Pain Pain - Right/Left: Right Pain - part of body: Hip     Time: 1645-1730(time spent doing paperwork for insurence company with phone ) PT Time Calculation (min) (ACUTE ONLY): 45 min  Charges:  $Therapeutic Exercise: 8-22 mins $Therapeutic Activity: 8-22 mins                    G Codes:      Reinaldo Berber, PT, DPT Acute Rehab Services Pager: [phone]     Reinaldo Berber 06/08/2017, 5:44 PM

## 2017-06-08 NOTE — Progress Notes (Signed)
   Subjective: Pt reports no chest pain this morning but had just received dilaudid for her hip pain, didn't note any prior.  She continues to work well with physical therapy.  She continues to say she needs oxygen despite continually being at 100% O2 saturation.    Objective:  Vital signs in last 24 hours: Vitals:   06/07/17 2150 06/08/17 0538 06/08/17 0859 06/08/17 1551  BP: (!) 152/98 126/65  138/75  Pulse: 75 75  86  Resp: 18 18    Temp: 98.2 F (36.8 C) 98.6 F (37 C)  98.7 F (37.1 C)  TempSrc: Oral Oral  Oral  SpO2: 100% 100% 100% 100%  Weight:      Height:       Physical Exam  Constitutional: She is oriented to person, place, and time. No distress.  Cardiovascular: Normal rate, regular rhythm and normal heart sounds. PMI is displaced (to right). Exam reveals no gallop and no friction rub.  No murmur heard. Pt with pain on deep inspiration and palpation of anterior chest wall.    Pulmonary/Chest: Effort normal and breath sounds normal. No respiratory distress. She has no wheezes. She has no rales. She exhibits no tenderness.  Abdominal: Soft. Bowel sounds are normal. She exhibits no distension and no mass. There is no tenderness. There is no rebound and no guarding.  Neurological: She is alert and oriented to person, place, and time.  Skin: She is not diaphoretic.    Assessment/Plan:  Principal Problem:   Osteoarthritis of right hip Active Problems:   Atypical chest pain  Atypical Chest pain: Agree that chest pain is atypical for angina and only concerning features are new LBBB and family history . Cardiology signed off   -Troponins negative 4 -ECG's following procedure have been sinus rhythm, normal -ECHO showed EF of 65-70% w/no regional wall motion abnormalities, normal diastolic function, mild-moderate Tricuspid Regurgitation.   Asthma: chronic, mild, stable only using albuterol 1-2x per week at home.     -Discontinue supplemental oxygen as pt continually at  100%, spoke with pt about this issue she was reluctant to stop -Lung exam clear but already had one breathing treatment -PRN duonebs ever 4 hours   HTN: chronic, pt on 10mg  of amlodipine   -continue amlodipine   Osteoarthritis of R hip   -successful R total hip replacement -pt working well with PT -pain well controlled   Dispo: Anticipated discharge in approximately 1-2day(s).   Katherine Roan, MD 06/08/2017, 6:01 PM Vickki Muff MD PGY-1 Internal Medicine Pager # [phone]

## 2017-06-08 NOTE — Progress Notes (Signed)
Physical Therapy Treatment Patient Details Name: [name] MRN: [id number] DOB: 12-Oct-1965 Today's Date: 06/08/2017    History of Present Illness 51 y.o. female s/p R THA (Anterior) 12/3  PMH includes: Asthma (chronic), Obesity, Anemia, Prediabetes, GERD, HTN, Dyspnea. Currently with c/o of Chest Pain     PT Comments    Patient progressing today with therapy. Session focused on OOB mobility. Patient now ambulating in hallway short distances with poor mechanics at this time (see general gait comments below). No reports of chest pain and feeling less lethargic this morning. Will see this afternoon to progress and improve gait and activity tolerance, and likely stair train tomorrow pending continued progress, patient agreeable with plan.    Follow Up Recommendations  Home health PT;DC plan and follow up therapy as arranged by surgeon;Supervision/Assistance - 24 hour     Equipment Recommendations  Rolling walker with 5" wheels;3in1 (PT)    Recommendations for Other Services OT consult     Precautions / Restrictions Precautions Precautions: Fall Precaution Comments: reviewed supine therex, chest pain  Restrictions Weight Bearing Restrictions: Yes RLE Weight Bearing: Weight bearing as tolerated    Mobility  Bed Mobility Overal bed mobility: Needs Assistance Bed Mobility: Supine to Sit;Sit to Supine     Supine to sit: Modified independent (Device/Increase time)        Transfers Overall transfer level: Needs assistance Equipment used: Rolling walker (2 wheeled) Transfers: Sit to/from [facility] Sit to Stand: Min guard Stand pivot transfers: Min guard       General transfer comment: Min guard for safety, cues for safe hand placment and sequencing with RW.   Ambulation/Gait Ambulation/Gait assistance: Min guard Ambulation Distance (Feet): 50 Feet Assistive device: Rolling walker (2 wheeled) Gait Pattern/deviations: Step-to pattern;Decreased stride  length;Decreased step length - right;Antalgic Gait velocity: decreased   General Gait Details: Patient with diffaculty advancing RLE at this time. cues for step length. min guard for safety with chair follow. Patient ambulated 50 feet with 2 rest breaks, then utilized chair to return to room due to Left leg cramping and fatigue.    Stairs            Wheelchair Mobility    Modified Rankin (Stroke Patients Only)       Balance Overall balance assessment: Needs assistance Sitting-balance support: Feet unsupported;No upper extremity supported Sitting balance-Leahy Scale: Fair     Standing balance support: During functional activity;Bilateral upper extremity supported Standing balance-Leahy Scale: Poor Standing balance comment: reliant on UE support at this time for standing balance. c/o of dizziness at rest                             Cognition Arousal/Alertness: Awake/alert;Lethargic Behavior During Therapy: WFL for tasks assessed/performed Overall Cognitive Status: Within Functional Limits for tasks assessed                                        Exercises Total Joint Exercises Ankle Circles/Pumps: AROM;Both;20 reps Quad Sets: AROM;Both;15 reps Hip ABduction/ADduction: AROM;Both;10 reps Standing Hip Extension: PROM;Right;10 reps    General Comments General comments (skin integrity, edema, etc.): SpO2=>90% on 1L with activity. HR=88-115 throughout session. Patient reporting feeling better today than yesterday with less chest pain.      Pertinent Vitals/Pain Pain Assessment: 0-10 Pain Score: 7  Pain Location: R hip, Chest  Pain  Descriptors / Indicators: Dull;Aching;Operative site guarding;Grimacing Pain Intervention(s): Limited activity within patient's tolerance;Monitored during session;Repositioned;Patient requesting pain meds-RN notified    Home Living                      Prior Function            PT Goals (current  goals can now be found in the care plan section) Acute Rehab PT Goals Patient Stated Goal: return home with HHPT PT Goal Formulation: With patient Time For Goal Achievement: 06/21/17 Potential to Achieve Goals: Good Progress towards PT goals: Progressing toward goals    Frequency    7X/week      PT Plan Current plan remains appropriate    Co-evaluation              AM-PAC PT "6 Clicks" Daily Activity  Outcome Measure  Difficulty turning over in bed (including adjusting bedclothes, sheets and blankets)?: A Lot Difficulty moving from lying on back to sitting on the side of the bed? : A Lot Difficulty sitting down on and standing up from a chair with arms (e.g., wheelchair, bedside commode, etc,.)?: A Lot Help needed moving to and from a bed to chair (including a wheelchair)?: A Little Help needed walking in hospital room?: A Little Help needed climbing 3-5 steps with a railing? : A Lot 6 Click Score: 14    End of Session Equipment Utilized During Treatment: Gait belt;Oxygen Activity Tolerance: Patient limited by fatigue;Patient limited by lethargy Patient left: in bed;with call bell/phone within reach Nurse Communication: Mobility status;Other (comment) PT Visit Diagnosis: Unsteadiness on feet (R26.81);Other abnormalities of gait and mobility (R26.89);Repeated falls (R29.6);Muscle weakness (generalized) (M62.81);Pain Pain - Right/Left: Right Pain - part of body: Hip     Time: 1443-1540 PT Time Calculation (min) (ACUTE ONLY): 46 min  Charges:  $Gait Training: 8-22 mins $Therapeutic Exercise: 8-22 mins $Therapeutic Activity: 8-22 mins                    G Codes:       Reinaldo Berber, PT, DPT Acute Rehab Services Pager: [phone]     Reinaldo Berber 06/08/2017, 9:58 AM

## 2017-06-08 NOTE — Progress Notes (Signed)
   Subjective:  Patient reports pain as mild to moderate.  C/o R hip soreness. CP improved with topical voltaren. Slow progress with therapy.  Objective:   VITALS:   Vitals:   06/07/17 2013 06/07/17 2150 06/08/17 0538 06/08/17 0859  BP:  (!) 152/98 126/65   Pulse:  75 75   Resp:  18 18   Temp:  98.2 F (36.8 C) 98.6 F (37 C)   TempSrc:  Oral Oral   SpO2: 99% 100% 100% 100%  Weight:      Height:        NAD ABD soft Sensation intact distally Intact pulses distally Dorsiflexion/Plantar flexion intact Incision: dressing C/D/I Compartment soft   Lab Results  Component Value Date   WBC 10.7 (H) 06/08/2017   HGB 8.8 (L) 06/08/2017   HCT 27.0 (L) 06/08/2017   MCV 83.3 06/08/2017   PLT 225 06/08/2017   BMET    Component Value Date/Time   NA 133 (L) 06/07/2017 0333   NA 141 09/28/2016 1012   K 3.8 06/07/2017 0333   CL 99 (L) 06/07/2017 0333   CO2 21 (L) 06/07/2017 0333   GLUCOSE 215 (H) 06/07/2017 0333   BUN 11 06/07/2017 0333   BUN 12 09/28/2016 1012   CREATININE 0.71 06/07/2017 0333   CREATININE 0.58 08/12/2014 0923   CALCIUM 9.1 06/07/2017 0333   GFRNONAA >60 06/07/2017 0333   GFRAA >60 06/07/2017 0333     Assessment/Plan: 2 Days Post-Op   Principal Problem:   Osteoarthritis of right hip Active Problems:   Atypical chest pain   WBAT with walker  DVT ppx: ASA, SCDs, TEDs PO pain control CP: appreciate cardiology eval Asthma: per hospitalist  ABLA on chronic anemia: asymptomatic, monitor PT/OT Dispo: D/C home with HEP when medical issues resolved, likely tomorrow   [name] 06/08/2017, 12:49 PM   Rod Can, MD Cell [phone]

## 2017-06-08 NOTE — Progress Notes (Signed)
Progress Note  Patient Name: [name] Date of Encounter: 06/08/2017  Primary Cardiologist: Dr. Aundra Dubin  Subjective   Pt continues to have chest pain that is relieved with voltaren cream.  Inpatient Medications    Scheduled Meds: . amLODipine  10 mg Oral Daily  . aspirin  81 mg Oral BID  . diclofenac sodium  4 g Topical QID  . docusate sodium  100 mg Oral BID  . famotidine  20 mg Oral BID  . loratadine  10 mg Oral Daily  . mometasone-formoterol  2 puff Inhalation BID  . senna  2 tablet Oral QHS   Continuous Infusions: . sodium chloride 150 mL/hr at 06/06/17 2257  . methocarbamol (ROBAXIN)  IV Stopped (06/06/17 2258)   PRN Meds: acetaminophen **OR** acetaminophen, albuterol, diphenhydrAMINE, gi cocktail, HYDROcodone-acetaminophen, HYDROcodone-acetaminophen, HYDROmorphone (DILAUDID) injection, ipratropium-albuterol, menthol-cetylpyridinium **OR** phenol, methocarbamol **OR** methocarbamol (ROBAXIN)  IV, metoCLOPramide **OR** metoCLOPramide (REGLAN) injection, ondansetron **OR** ondansetron (ZOFRAN) IV, polyethylene glycol   Vital Signs    Vitals:   06/07/17 1315 06/07/17 2013 06/07/17 2150 06/08/17 0538  BP:   (!) 152/98 126/65  Pulse:   75 75  Resp:   18 18  Temp:   98.2 F (36.8 C) 98.6 F (37 C)  TempSrc:   Oral Oral  SpO2: 100% 99% 100% 100%  Weight:      Height:        Intake/Output Summary (Last 24 hours) at 06/08/2017 0848 Last data filed at 06/07/2017 1500 Gross per 24 hour  Intake 1350 ml  Output -  Net 1350 ml   Filed Weights   06/06/17 1245 06/06/17 2154  Weight: 170 lb (77.1 kg) 170 lb (77.1 kg)     Physical Exam   General: Well developed, well nourished, female appearing in no acute distress. Head: Normocephalic, atraumatic.  Neck: Supple without bruits, no JVD Lungs:  Resp regular and unlabored, CTA. Heart: RRR, S1, S2, no S3, S4, or murmur; no rub. Abdomen: Soft, non-tender, non-distended with normoactive bowel sounds. No  hepatomegaly. No rebound/guarding. No obvious abdominal masses. Extremities: No clubbing, cyanosis, no edema. Distal pedal pulses are 2+ bilaterally. Left leg wrapped Neuro: Alert and oriented X 3. Moves all extremities spontaneously. Psych: Normal affect.  Labs    Chemistry Recent Labs  Lab 06/01/17 0943 06/07/17 0333  NA 135 133*  K 3.6 3.8  CL 105 99*  CO2 24 21*  GLUCOSE 132* 215*  BUN 10 11  CREATININE 0.51 0.71  CALCIUM 9.4 9.1  GFRNONAA >60 >60  GFRAA >60 >60  ANIONGAP 6 13     Hematology Recent Labs  Lab 06/01/17 0943 06/07/17 0333 06/08/17 0523  WBC 5.7 14.7* 10.7*  RBC 4.12 3.99 3.24*  HGB 11.5* 10.9* 8.8*  HCT 34.5* 33.5* 27.0*  MCV 83.7 84.0 83.3  MCH 27.9 27.3 27.2  MCHC 33.3 32.5 32.6  RDW 15.8* 15.8* 15.8*  PLT 220 271 225    Cardiac Enzymes Recent Labs  Lab 06/06/17 2126 06/06/17 2208 06/07/17 0333 06/07/17 1005  TROPONINI <0.03 <0.03 <0.03 <0.03   No results for input(s): TROPIPOC in the last 168 hours.   BNPNo results for input(s): BNP, PROBNP in the last 168 hours.   DDimer No results for input(s): DDIMER in the last 168 hours.   Radiology    Dg Pelvis Portable  Result Date: 06/06/2017 CLINICAL DATA:  RIGHT hip arthroplasty. EXAM: PORTABLE PELVIS 1-2 VIEWS COMPARISON:  MRI of the RIGHT hip January 26, 2017 FINDINGS: Status post recent  RIGHT hip total arthroplasty with intact well-seated femoral and acetabular non sub cemented hardware. No fracture deformity. No dislocation. Shallow LEFT acetabula with mild osteoarthrosis. RIGHT hip soft tissue gas consistent with recent surgery. IMPRESSION: Status post RIGHT hip total arthroplasty with expected postoperative change. Electronically Signed   By: Elon Alas M.D.   On: 06/06/2017 19:27   Dg C-arm 61-120 Min  Result Date: 06/06/2017 CLINICAL DATA:  Right hip arthroplasty. EXAM: DG C-ARM 61-120 MIN; OPERATIVE RIGHT HIP WITH PELVIS COMPARISON:  None FLUOROSCOPY TIME:  18 seconds  FINDINGS: Right total hip arthroplasty without failure or complication. Mild osteoarthritis of the left hip. IMPRESSION: Interval right total hip arthroplasty. Electronically Signed   By: Kathreen Devoid   On: 06/06/2017 19:45   Dg Hip Operative Unilat W Or W/o Pelvis Right  Result Date: 06/06/2017 CLINICAL DATA:  Right hip arthroplasty. EXAM: DG C-ARM 61-120 MIN; OPERATIVE RIGHT HIP WITH PELVIS COMPARISON:  None FLUOROSCOPY TIME:  18 seconds FINDINGS: Right total hip arthroplasty without failure or complication. Mild osteoarthritis of the left hip. IMPRESSION: Interval right total hip arthroplasty. Electronically Signed   By: Kathreen Devoid   On: 06/06/2017 19:45     Telemetry     - Personally Reviewed  ECG    No new tracings - Personally Reviewed   Cardiac Studies   Echo 06/07/17: Study Conclusions - Left ventricle: The cavity size was normal. Wall thickness was   increased in a pattern of mild LVH. Systolic function was   vigorous. The estimated ejection fraction was in the range of 65%   to 70%. Wall motion was normal; there were no regional wall   motion abnormalities. Left ventricular diastolic function   parameters were normal. - Tricuspid valve: There was mild-moderate regurgitation.  Patient Profile     51 y.o. female with history of HTN and asthma had transient LBBB and chest pain in OR today after THR.  Assessment & Plan    1. Chest pain - echo normal with no WMA, LVEF 65-70% and mild LVH - suspect this chest pain is not cardiac in nature - pt is having chest pain this morning that is successfully treated with voltaren cream  2. HTN - recent pressure 126-65, continue to monitor - home medications norvasc continued - HTN likely driven by pain following surgery  3. Asthma - continue inhalers   Signed, Ledora Bottcher , PA-C 8:48 AM 06/08/2017 Pager: [phone]  As above; pt seen and examined; complains of dyspnea and CP with inspiration related to  asthma; enzymes negative; no WMA on echo; no plans for further ischemia eval. Continue therapy for asthma and PT for recent surgery; we will sign off; please call with questions. Kirk Ruths, MD

## 2017-06-09 ENCOUNTER — Encounter (HOSPITAL_COMMUNITY): Payer: Self-pay | Admitting: Orthopedic Surgery

## 2017-06-09 DIAGNOSIS — N920 Excessive and frequent menstruation with regular cycle: Secondary | ICD-10-CM

## 2017-06-09 DIAGNOSIS — D500 Iron deficiency anemia secondary to blood loss (chronic): Secondary | ICD-10-CM

## 2017-06-09 LAB — CBC
HCT: 25.6 % — ABNORMAL LOW (ref 36.0–46.0)
Hemoglobin: 8.5 g/dL — ABNORMAL LOW (ref 12.0–15.0)
MCH: 28.1 pg (ref 26.0–34.0)
MCHC: 33.2 g/dL (ref 30.0–36.0)
MCV: 84.5 fL (ref 78.0–100.0)
Platelets: 215 K/uL (ref 150–400)
RBC: 3.03 MIL/uL — ABNORMAL LOW (ref 3.87–5.11)
RDW: 16.2 % — ABNORMAL HIGH (ref 11.5–15.5)
WBC: 7.8 K/uL (ref 4.0–10.5)

## 2017-06-09 MED ORDER — LACTULOSE 10 GM/15ML PO SOLN
20.0000 g | Freq: Once | ORAL | Status: AC
  Administered 2017-06-09: 20 g via ORAL
  Filled 2017-06-09: qty 30

## 2017-06-09 NOTE — Progress Notes (Signed)
   Subjective: Pt reports no chest pain this morning working well with PT, says that voltaren gel is really helping her chest pain the most.  Denies SOB, dizziness.  Does state has not had a bowel movement the past few days which is unusual for her.       Objective:  Vital signs in last 24 hours: Vitals:   06/08/17 2000 06/08/17 2008 06/09/17 0700 06/09/17 0809  BP:  136/71 122/64   Pulse:  99 84   Resp:   18   Temp:  98.4 F (36.9 C) 98 F (36.7 C)   TempSrc:  Oral Oral   SpO2: 99% 100% 100% 99%  Weight:      Height:       Physical Exam  Constitutional: She is oriented to person, place, and time. No distress.  Cardiovascular: Normal rate, regular rhythm and normal heart sounds. PMI is displaced (to right). Exam reveals no gallop and no friction rub.  No murmur heard. Pt with pain on deep inspiration and palpation of anterior chest wall.    Pulmonary/Chest: Effort normal and breath sounds normal. No respiratory distress. She has no wheezes. She has no rales. She exhibits no tenderness.  Abdominal: Soft. Bowel sounds are normal. She exhibits no distension and no mass. There is no tenderness. There is no rebound and no guarding.  Neurological: She is alert and oriented to person, place, and time.  Skin: She is not diaphoretic.    Assessment/Plan:  Principal Problem:   Osteoarthritis of right hip Active Problems:   Atypical chest pain  Atypical Chest pain: Agree that chest pain is atypical for angina and only concerning features are new LBBB and family history . Cardiology signed off   -Troponins negative 4 -ECG's following procedure have been sinus rhythm, normal -ECHO showed EF of 65-70% w/no regional wall motion abnormalities, normal diastolic function, mild-moderate Tricuspid Regurgitation.   Asthma: chronic, mild, stable only using albuterol 1-2x per week at home.     -not using supplemental O2   -Lung exam continues to be clear  -PRN duonebs ever 4 hours   HTN:  chronic, pt on 10mg  of amlodipine   -continue amlodipine  Iron Deficiency anemia: chronic, has heavy menstrual periods, on ferrous sulfate at home.  -also with blood loss during surgery -continue ferrous sulfate, told pt with pain medicines for hip and iron will  need to continue bowel regimen at home    Osteoarthritis of R hip   -successful R total hip replacement -pt working with PT recommending home with home health -pain well controlled -constipation-doesn't take pills is on miralax but will give one time dose of lactulose  Dispo: Pt is medically stable and anticipated discharge home today.   Katherine Roan, MD 06/09/2017, 9:13 AM Vickki Muff MD PGY-1 Internal Medicine Pager # [phone]

## 2017-06-09 NOTE — Progress Notes (Signed)
Physical Therapy Treatment Patient Details Name: [name] MRN: [id number] DOB: Nov 22, 1965 Today's Date: 06/09/2017    History of Present Illness 51 y.o. female s/p R THA (Anterior) 12/3  PMH includes: Asthma (chronic), Obesity, Anemia, Prediabetes, GERD, HTN, Dyspnea. Currently with c/o of Chest Pain     PT Comments    Patient progressing well. Session focused extensively on gait and stair training. Patient demonstrating improved activity tolerance with increased walking distance and and use of AD on stairs this session. Will plan to see patient again to reinforce technique before safe return home when medically cleared for d/c.     Follow Up Recommendations  Home health PT;DC plan and follow up therapy as arranged by surgeon;Supervision/Assistance - 24 hour     Equipment Recommendations  Rolling walker with 5" wheels;3in1 (PT)    Recommendations for Other Services       Precautions / Restrictions Precautions Precautions: Fall Precaution Comments: reviewed supine therex, chest pain  Restrictions Weight Bearing Restrictions: Yes RLE Weight Bearing: Weight bearing as tolerated    Mobility  Bed Mobility Overal bed mobility: Needs Assistance Bed Mobility: Supine to Sit;Sit to Supine     Supine to sit: Modified independent (Device/Increase time) Sit to supine: Modified independent (Device/Increase time)   General bed mobility comments: mod I for bed mobility now  Transfers Overall transfer level: Needs assistance Equipment used: Rolling walker (2 wheeled) Transfers: Sit to/from [facility] Sit to Stand: Supervision Stand pivot transfers: Supervision       General transfer comment: supevision at this point for safety  Ambulation/Gait Ambulation/Gait assistance: Min guard Ambulation Distance (Feet): 150 Feet Assistive device: Rolling walker (2 wheeled) Gait Pattern/deviations: Step-to pattern;Decreased stride length;Decreased step length -  right;Antalgic Gait velocity: decreased   General Gait Details: patient unable to acheive step through gait despite cues due to hip flexion weakness. step to gait. progressed ambulation today with improved activity tolerence and stable vitals on RA    Stairs Stairs: Yes   Stair Management: No rails;Backwards;Forwards;With walker Number of Stairs: 12 General stair comments: 2x4 stairs backwards with RW, 1x4 platform stairs, cues for sequecning and guarding patient performing with good techinque   Wheelchair Mobility    Modified Rankin (Stroke Patients Only)       Balance Overall balance assessment: Needs assistance Sitting-balance support: Feet unsupported;No upper extremity supported Sitting balance-Leahy Scale: Fair     Standing balance support: During functional activity;Bilateral upper extremity supported Standing balance-Leahy Scale: Poor Standing balance comment: reliant on UE support at this time for standing balance. c/o of dizziness at rest                             Cognition Arousal/Alertness: Awake/alert Behavior During Therapy: WFL for tasks assessed/performed Overall Cognitive Status: Within Functional Limits for tasks assessed                                        Exercises      General Comments General comments (skin integrity, edema, etc.): VSS      Pertinent Vitals/Pain Pain Assessment: 0-10 Pain Score: 7  Pain Location: R hip, Chest  Pain Descriptors / Indicators: Dull;Aching;Operative site guarding;Grimacing Pain Intervention(s): Limited activity within patient's tolerance;Monitored during session;Premedicated before session    Home Living  Prior Function            PT Goals (current goals can now be found in the care plan section) Acute Rehab PT Goals Patient Stated Goal: return home with HHPT PT Goal Formulation: With patient Time For Goal Achievement: 06/21/17 Potential to  Achieve Goals: Good Progress towards PT goals: Progressing toward goals    Frequency    7X/week      PT Plan Current plan remains appropriate    Co-evaluation              AM-PAC PT "6 Clicks" Daily Activity  Outcome Measure  Difficulty turning over in bed (including adjusting bedclothes, sheets and blankets)?: A Little Difficulty moving from lying on back to sitting on the side of the bed? : A Little Difficulty sitting down on and standing up from a chair with arms (e.g., wheelchair, bedside commode, etc,.)?: A Little Help needed moving to and from a bed to chair (including a wheelchair)?: A Little Help needed walking in hospital room?: A Little Help needed climbing 3-5 steps with a railing? : A Little 6 Click Score: 18    End of Session Equipment Utilized During Treatment: Gait belt Activity Tolerance: Patient tolerated treatment well Patient left: in chair;with call bell/phone within reach;with nursing/sitter in room Nurse Communication: Mobility status PT Visit Diagnosis: Unsteadiness on feet (R26.81);Other abnormalities of gait and mobility (R26.89);Repeated falls (R29.6);Muscle weakness (generalized) (M62.81);Pain Pain - Right/Left: Right Pain - part of body: Hip     Time: 1700-1749 PT Time Calculation (min) (ACUTE ONLY): 42 min  Charges:  $Gait Training: 23-37 mins                    G Codes:       Reinaldo Berber, PT, DPT Acute Rehab Services Pager: [phone]    Reinaldo Berber 06/09/2017, 10:25 AM

## 2017-06-09 NOTE — Progress Notes (Signed)
Discharge instructions and prescriptions given and reviewed with patient. Patient verbalized understanding. Walker supplied. Patient states has bedside commode at home. IV removed. Patient dressed in room waiting for ride.

## 2017-06-09 NOTE — Care Management Note (Signed)
Case Management Note  Patient Details  Name: [name] MRN: [id number] Date of Birth: 07/08/1965  Subjective/Objective:                    Action/Plan:  Patient was preoperatively going to do HEP at discharge, unfortunately has not progressed well and will require Home Health therapy. Patient was assigned to [facility] prior to admission, no changes with that.,    Expected Discharge Date:   06/09/17               Expected Discharge Plan:  [place]  In-House Referral:     Discharge planning Services  CM Consult  Post Acute Care Choice:  Home Health Choice offered to:  (S) Patient  DME Arranged:  (Has RW and 3in1) DME Agency:  NA  HH Arranged:  PT(Patient will do HEP (Home Exercise Program)) [place] Agency:  [facility] ([facility])  Status of Service:  Completed, signed off  If discussed at [employer] of Stay Meetings, dates discussed:    Additional Comments:  Ninfa Meeker, RN 06/09/2017, 8:43 AM

## 2017-07-18 ENCOUNTER — Ambulatory Visit: Payer: BLUE CROSS/BLUE SHIELD | Attending: Orthopedic Surgery | Admitting: Physical Therapy

## 2017-07-18 ENCOUNTER — Encounter: Payer: Self-pay | Admitting: Physical Therapy

## 2017-07-18 DIAGNOSIS — M25651 Stiffness of right hip, not elsewhere classified: Secondary | ICD-10-CM | POA: Diagnosis not present

## 2017-07-18 DIAGNOSIS — M25551 Pain in right hip: Secondary | ICD-10-CM | POA: Diagnosis not present

## 2017-07-18 DIAGNOSIS — R262 Difficulty in walking, not elsewhere classified: Secondary | ICD-10-CM

## 2017-07-18 DIAGNOSIS — R600 Localized edema: Secondary | ICD-10-CM | POA: Diagnosis not present

## 2017-07-18 DIAGNOSIS — Z96641 Presence of right artificial hip joint: Secondary | ICD-10-CM | POA: Diagnosis not present

## 2017-07-18 DIAGNOSIS — M6281 Muscle weakness (generalized): Secondary | ICD-10-CM

## 2017-07-18 NOTE — Therapy (Signed)
[place], [place], [address] Phone: [phone]   Fax:  [phone]  Physical Therapy Evaluation  Patient Details  Name: [name] MRN: [id number] Date of Birth: 03/16/1966 Referring Provider: Dr. Rod Can    Encounter Date: 07/18/2017  PT End of Session - 07/18/17 1305    Visit Number  1    Number of Visits  16    Date for PT Re-Evaluation  09/12/17    PT Start Time  1150    PT Stop Time  1245    PT Time Calculation (min)  55 min    Activity Tolerance  Patient tolerated treatment well    Behavior During Therapy  [facility] for tasks assessed/performed       Past Medical History:  Diagnosis Date  . Anemia   . Asthma   . Bursitis of left hip   . Decreased appetite   . GERD (gastroesophageal reflux disease)   . History of hiatal hernia    repaired  . History of stomach ulcers   . Hypertension   . Migraine    "maybe twice in the last 10 yrs" (10/16/2013)  . PONV (postoperative nausea and vomiting)   . Shortness of breath dyspnea     Past Surgical History:  Procedure Laterality Date  . BARTHOLIN GLAND CYST EXCISION    . BREAST LUMPECTOMY WITH RADIOACTIVE SEED LOCALIZATION Left 01/23/2016   Procedure: BREAST LUMPECTOMY WITH RADIOACTIVE SEED LOCALIZATION;  Surgeon: Jackolyn Confer, MD;  Location: [place];  Service: General;  Laterality: Left;  . CESAREAN SECTION  1989  . CHOLECYSTECTOMY  ~ 2000  . EPIGASTRIC HERNIA REPAIR  07/2003  . HERNIA REPAIR    . INCISION AND DRAINAGE ABSCESS  10/2005   Bartholin abscess.  . TONSILLECTOMY AND ADENOIDECTOMY  1983  . 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 TOTAL HIP ARTHROPLASTY Right 06/06/2017   Procedure: RIGHT TOTAL HIP ARTHROPLASTY ANTERIOR APPROACH;  Surgeon: Rod Can, MD;  Location: [place];  Service: Orthopedics;  Laterality: Right;  Needs RNFA  . TUBAL LIGATION  1994    There were no vitals filed for this visit.   Subjective  Assessment - 07/18/17 1155    Subjective  Pt underwent Rt. THA on 06/06/17.  She used the walker initially, now uses rarely.  She did not have HHPT.  She has pain with standing and walking longer distances.  She cannot bend down well for lower body ADLs.  She feels her Rt. leg is longer than her Lt. since the surgery. Sees MD Wed.  She has Rt. prox.  hip numbness, swelling, "pinching.  She is unable to work right now, taking 12 weeks off to recover.      Limitations  Sitting;Lifting;Standing;Walking;House hold activities;Other (comment) ADLs, sleeping , work     How long can you sit comfortably?  2 1/2 hours uncomfortable     How long can you stand comfortably?  20 min max.  Has to walk and stand for her job     How long can you walk comfortably?  20 min     Diagnostic tests  XR pre and post surgery    Patient Stated Goals  Pt would like to return to work and move without difficulty     Currently in Pain?  Yes    Pain Score  5     Pain Location  Hip    Pain Orientation  Right    Pain Descriptors / Indicators  Aching;Other (Comment);Numbness pinching     Pain Type  Surgical pain    Pain Onset  More than a month ago    Pain Frequency  Intermittent    Aggravating Factors   activity     Pain Relieving Factors  OTC, used heat/ice, rest    Effect of Pain on Daily Activities  limits activity     Multiple Pain Sites  Yes         [facility] PT Assessment - 07/18/17 0001      Assessment   Medical Diagnosis  Rt. THA    Referring Provider  Dr. Rod Can     Onset Date/Surgical Date  06/06/17    Next MD Visit  this week    Prior Therapy  Prior to surgery       Precautions   Precautions  Anterior Hip      Restrictions   Weight Bearing Restrictions  Yes    Other Position/Activity Restrictions  WBAT, NO SLR, NO passive hip extension       Balance Screen   Has the patient fallen in the past 6 months  No      [place] residence    Living Arrangements   Other relatives    Additional Comments  4 Steps       Prior Function   Level of Independence  Independent with basic ADLs;Independent with household mobility without device;Independent with community mobility with device    Vocation  Full time employment    Pensions consultant, 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       [employer] Status  Within Functional Limits for tasks assessed      Observation/Other Assessments   Focus on Therapeutic Outcomes (FOTO)   67%      Sensation   Light Touch  Impaired by gross assessment    Additional Comments  numb R prox hip       Coordination   Gross Motor Movements are Fluid and Coordinated  Not tested      Functional Tests   Functional tests  Squat;Single leg stance      Squat   Comments  mini ROM       Single Leg Stance   Comments  needs UE assist bilateral       Posture/Postural Control   Posture/Postural Control  Postural limitations    Postural Limitations  Anterior pelvic tilt;Right pelvic obliquity;Flexed trunk    Posture Comments  hyperlordosis in lumbar spine, Rt. leg 1/4 inch longer than L , high Rt. hip in standing       AROM   Right Hip External Rotation   25    Right Hip Internal Rotation   20    Left Hip External Rotation   38    Left Hip Internal Rotation   32      PROM   Overall PROM Comments  Rt. hip flexion 90 deg, min to no pain, light mid range ER and IR       Strength   Right Hip Flexion  2+/5    Right Hip External Rotation   4/5    Right Hip Internal Rotation  4/5    Right Hip ABduction  3+/5 tol light pressure     Left Hip Flexion  4+/5    Left Hip External Rotation  5/5    Left Hip Internal Rotation  5/5    Left Hip ABduction  4/5  Right Knee Flexion  4+/5    Right Knee Extension  5/5    Left Knee Flexion  5/5    Left Knee Extension  5/5      Palpation   Palpation comment  Sore and tender prox Rt hip, edema      Bed Mobility   Bed Mobility  -- Independent       Ambulation/Gait    Ambulation Distance (Feet)  150 Feet    Assistive device  None    Gait Pattern  Decreased step length - right;Decreased weight shift to right;Antalgic        Objective measurements completed on examination: See above findings.      [facility] Adult PT Treatment/Exercise - 07/18/17 0001      Self-Care   Self-Care  Posture;Heat/Ice Application;Other Self-Care Comments    Posture  tight ant hip     Heat/Ice Application  ice post     Other Self-Care Comments   many questions about precautions       Knee/Hip Exercises: Stretches   Hip Flexor Stretch  Right;2 reps;30 seconds    Hip Flexor Stretch Limitations  off table supine       Knee/Hip Exercises: Sidelying   Clams  x 10 and reverse clam  10              PT Education - 07/18/17 1305    Education provided  Yes    Education Details  PT/POC, HEP and anterior hip precautions , leg length discrepancy    Person(s) Educated  Patient    Methods  Explanation;Demonstration;Tactile cues;Verbal cues;Handout    Comprehension  Verbalized understanding;Returned demonstration       PT Short Term Goals - 07/18/17 1306      PT SHORT TERM GOAL #1   Title  Pt will be I with HEP for ROM and strength of LE     Time  4    Period  Weeks    Status  New    Target Date  08/15/17      PT SHORT TERM GOAL #2   Title  Pt will verbalize ability to maintain neutral posture and utilize stretches throughout the day to manage pain     Time  4    Period  Weeks    Status  New    Target Date  08/15/17      PT SHORT TERM GOAL #3   Title  Pt will understand precautions and be able to verbalize limitations.     Time  4    Period  Weeks    Status  New    Target Date  08/15/17        PT Long Term Goals - 07/18/17 1330      PT LONG TERM GOAL #1   Title  Pt will demo strength in Rt. hip to 4+/5 all planes for maximal gait mechanics and efficiency    Time  8    Period  Weeks    Status  New    Target Date  09/12/17      PT LONG TERM GOAL #2    Title  Pt will be able to perform work duties with pain <=3/10 as allowed by MD.     Time  8    Period  Weeks    Status  New    Target Date  09/12/17      PT LONG TERM GOAL #3   Title  FOTO to less than 50%  ability to indicate significant improvement in functional ability    Time  8    Period  Weeks    Status  New    Target Date  09/12/17      PT LONG TERM GOAL #4   Title  Pt will be able to get into and out of cars without pain    Time  8    Period  Weeks    Status  New    Target Date  09/12/17             Plan - 07/18/17 1336    Clinical Impression Statement  Pt presents for low complexity eval of Rt. hip s/p total hip arthroplasty.  She is WBAT and has anterior approach precautions.  She is 6 weeks post.- op today. She has a 1/4 inch leg length discrepancy.  She is very tight anteriorly, swelling.      Clinical Presentation  Stable    Clinical Decision Making  Low    Rehab Potential  Excellent    PT Frequency  2x / week unable to do 3 x     PT Duration  8 weeks    PT Treatment/Interventions  ADLs/Self Care Home Management;Cryotherapy;Moist Heat;Electrical Stimulation;Functional mobility training;Therapeutic activities;DME Instruction;Gait training;Stair training;Balance training;Therapeutic exercise;Neuromuscular re-education;Patient/family education;Manual techniques;Passive range of motion;Taping    PT Next Visit Plan  check HEP, manual for Rt. hip swelling, consider tape, gait, Nustep     PT Home Exercise Plan  ant hip stretching , clam and reverse clam with pillows     Consulted and Agree with Plan of Care  Patient       Patient will benefit from skilled therapeutic intervention in order to improve the following deficits and impairments:  Abnormal gait, Decreased balance, Decreased mobility, Difficulty walking, Hypomobility, Impaired sensation, Decreased knowledge of precautions, Decreased range of motion, Decreased strength, Increased fascial restricitons, Impaired  flexibility, Postural dysfunction, Pain  Visit Diagnosis: Difficulty in walking, not elsewhere classified  Pain in right hip  Status post total replacement of right hip  Localized edema  Muscle weakness (generalized)  Stiffness of right hip, not elsewhere classified     Problem List Patient Active Problem List   Diagnosis Date Noted  . Atypical chest pain   . Dysuria 03/01/2017  . Osteoarthritis of right hip 12/23/2016  . Leg pain 08/12/2016  . 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 Fibrocystic changes of left breast 11/24/2015  . Chronic neck pain 06/07/2015  . GERD (gastroesophageal reflux disease) 03/08/2015  . Allergic rhinitis 03/08/2015  . Healthcare maintenance 03/08/2015  . History of cholecystectomy 03/08/2015  . Prediabetes 08/05/2014  . Hyperlipidemia 08/05/2014  . Asthma, chronic 08/04/2014  . Hypertension 08/04/2014  . Iron deficiency anemia 08/04/2014  . Obesity 08/04/2014  . History of diverticulitis 10/16/2013    PAA,JENNIFER 07/18/2017, 3:33 PM  [facility] [address] [place], [place], [address] Phone: [phone]   Fax:  [phone]  Name: [name] MRN: [id number] Date of Birth: 1965-10-24   Raeford Razor, PT 07/18/17 3:33 PM Phone: [phone] Fax: [phone]

## 2017-07-20 DIAGNOSIS — Z96641 Presence of right artificial hip joint: Secondary | ICD-10-CM | POA: Diagnosis not present

## 2017-07-25 ENCOUNTER — Ambulatory Visit: Payer: BLUE CROSS/BLUE SHIELD | Admitting: Physical Therapy

## 2017-07-25 ENCOUNTER — Encounter: Payer: Self-pay | Admitting: Physical Therapy

## 2017-07-25 DIAGNOSIS — M25651 Stiffness of right hip, not elsewhere classified: Secondary | ICD-10-CM

## 2017-07-25 DIAGNOSIS — R600 Localized edema: Secondary | ICD-10-CM

## 2017-07-25 DIAGNOSIS — R262 Difficulty in walking, not elsewhere classified: Secondary | ICD-10-CM

## 2017-07-25 DIAGNOSIS — M6281 Muscle weakness (generalized): Secondary | ICD-10-CM | POA: Diagnosis not present

## 2017-07-25 DIAGNOSIS — M25551 Pain in right hip: Secondary | ICD-10-CM

## 2017-07-25 DIAGNOSIS — Z96641 Presence of right artificial hip joint: Secondary | ICD-10-CM

## 2017-07-25 NOTE — Therapy (Signed)
[place], [place], [address] Phone: [phone]   Fax:  [phone]  Physical Therapy Treatment  Patient Details  Name: [name] MRN: [id number] Date of Birth: Mar 25, 1966 Referring Provider: Dr. Rod Can    Encounter Date: 07/25/2017  PT End of Session - 07/25/17 1506    Visit Number  2    Number of Visits  16    Date for PT Re-Evaluation  09/12/17    PT Start Time  1421    PT Stop Time  1514    PT Time Calculation (min)  53 min    Activity Tolerance  Patient tolerated treatment well    Behavior During Therapy  [facility] for tasks assessed/performed       Past Medical History:  Diagnosis Date  . Anemia   . Asthma   . Bursitis of left hip   . Decreased appetite   . GERD (gastroesophageal reflux disease)   . History of hiatal hernia    repaired  . History of stomach ulcers   . Hypertension   . Migraine    "maybe twice in the last 10 yrs" (10/16/2013)  . PONV (postoperative nausea and vomiting)   . Shortness of breath dyspnea     Past Surgical History:  Procedure Laterality Date  . BARTHOLIN GLAND CYST EXCISION    . BREAST LUMPECTOMY WITH RADIOACTIVE SEED LOCALIZATION Left 01/23/2016   Procedure: BREAST LUMPECTOMY WITH RADIOACTIVE SEED LOCALIZATION;  Surgeon: Jackolyn Confer, MD;  Location: [place];  Service: General;  Laterality: Left;  . CESAREAN SECTION  1989  . CHOLECYSTECTOMY  ~ 2000  . EPIGASTRIC HERNIA REPAIR  07/2003  . HERNIA REPAIR    . INCISION AND DRAINAGE ABSCESS  10/2005   Bartholin abscess.  . TONSILLECTOMY AND ADENOIDECTOMY  1983  . 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 TOTAL HIP ARTHROPLASTY Right 06/06/2017   Procedure: RIGHT TOTAL HIP ARTHROPLASTY ANTERIOR APPROACH;  Surgeon: Rod Can, MD;  Location: [place];  Service: Orthopedics;  Laterality: Right;  Needs RNFA  . TUBAL LIGATION  1994    There were no vitals filed for this visit.  Subjective Assessment -  07/25/17 1425    Subjective  Has been doing her exercises.  Pain comes and goes.  Can i walk around the track?     Currently in Pain?  Yes    Pain Score  4          [facility] Adult PT Treatment/Exercise - 07/25/17 0001      Knee/Hip Exercises: Stretches   Active Hamstring Stretch  Right;3 reps;30 seconds    Hip Flexor Stretch  Right;2 reps;30 seconds    Hip Flexor Stretch Limitations  off table supine cues to PPT        Knee/Hip Exercises: Aerobic   Nustep  6 min L5 UE and LE       Knee/Hip Exercises: Supine   Bridges  Both;1 set;10 reps    Straight Leg Raises  Strengthening;Right;1 set;10 reps    Straight Leg Raises Limitations  partial ROM, focus more on quadset     Other Supine Knee/Hip Exercises  clam green band x 10      Knee/Hip Exercises: Sidelying   Clams  x 10     Other Sidelying Knee/Hip Exercises  reverse clam x 10       Modalities   Modalities  Cryotherapy      Cryotherapy   Number Minutes Cryotherapy  10 Minutes  Cryotherapy Location  Hip    Type of Cryotherapy  Ice pack      Manual Therapy   Manual Therapy  Taping    Kinesiotex  Edema      Kinesiotix   Edema  2 fans Rt ant hip              PT Education - 07/25/17 1512    Education provided  Yes    Education Details  hip precautions     Person(s) Educated  Patient    Methods  Explanation    Comprehension  Verbalized understanding       PT Short Term Goals - 07/18/17 1306      PT SHORT TERM GOAL #1   Title  Pt will be I with HEP for ROM and strength of LE     Time  4    Period  Weeks    Status  New    Target Date  08/15/17      PT SHORT TERM GOAL #2   Title  Pt will verbalize ability to maintain neutral posture and utilize stretches throughout the day to manage pain     Time  4    Period  Weeks    Status  New    Target Date  08/15/17      PT SHORT TERM GOAL #3   Title  Pt will understand precautions and be able to verbalize limitations.     Time  4    Period  Weeks    Status   New    Target Date  08/15/17        PT Long Term Goals - 07/18/17 1330      PT LONG TERM GOAL #1   Title  Pt will demo strength in Rt. hip to 4+/5 all planes for maximal gait mechanics and efficiency    Time  8    Period  Weeks    Status  New    Target Date  09/12/17      PT LONG TERM GOAL #2   Title  Pt will be able to perform work duties with pain <=3/10 as allowed by MD.     Time  8    Period  Weeks    Status  New    Target Date  09/12/17      PT LONG TERM GOAL #3   Title  FOTO to less than 50% ability to indicate significant improvement in functional ability    Time  8    Period  Weeks    Status  New    Target Date  09/12/17      PT LONG TERM GOAL #4   Title  Pt will be able to get into and out of cars without pain    Time  8    Period  Weeks    Status  New    Target Date  09/12/17            Plan - 07/25/17 1436    Clinical Impression Statement  Pt doing well, cues to increase anterior hip tightness.  Trial of tape for swelling.     PT Treatment/Interventions  ADLs/Self Care Home Management;Cryotherapy;Moist Heat;Electrical Stimulation;Functional mobility training;Therapeutic activities;DME Instruction;Gait training;Stair training;Balance training;Therapeutic exercise;Neuromuscular re-education;Patient/family education;Manual techniques;Passive range of motion;Taping    PT Next Visit Plan  check HEP, manual for Rt. hip swelling, consider tape, gait, Nustep     PT Home Exercise Plan  ant hip stretching , clam  and reverse clam with pillows in sidelying     Consulted and Agree with Plan of Care  Patient       Patient will benefit from skilled therapeutic intervention in order to improve the following deficits and impairments:  Abnormal gait, Decreased balance, Decreased mobility, Difficulty walking, Hypomobility, Impaired sensation, Decreased knowledge of precautions, Decreased range of motion, Decreased strength, Increased fascial restricitons, Impaired  flexibility, Postural dysfunction, Pain  Visit Diagnosis: Difficulty in walking, not elsewhere classified  Pain in right hip  Status post total replacement of right hip  Localized edema  Muscle weakness (generalized)  Stiffness of right hip, not elsewhere classified     Problem List Patient Active Problem List   Diagnosis Date Noted  . Atypical chest pain   . Dysuria 03/01/2017  . Osteoarthritis of right hip 12/23/2016  . Leg pain 08/12/2016  . 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 Fibrocystic changes of left breast 11/24/2015  . Chronic neck pain 06/07/2015  . GERD (gastroesophageal reflux disease) 03/08/2015  . Allergic rhinitis 03/08/2015  . Healthcare maintenance 03/08/2015  . History of cholecystectomy 03/08/2015  . Prediabetes 08/05/2014  . Hyperlipidemia 08/05/2014  . Asthma, chronic 08/04/2014  . Hypertension 08/04/2014  . Iron deficiency anemia 08/04/2014  . Obesity 08/04/2014  . History of diverticulitis 10/16/2013    PAA,JENNIFER 07/25/2017, 3:16 PM  [facility] [address] [place], [place], [address] Phone: [phone]   Fax:  [phone]  Name: Zyriah L Ange MRN: [id number] Date of Birth: [birth year]  Raeford Razor, PT 07/25/17 3:17 PM Phone: [phone] Fax: [phone]

## 2017-07-27 ENCOUNTER — Ambulatory Visit: Payer: BLUE CROSS/BLUE SHIELD | Admitting: Physical Therapy

## 2017-07-27 ENCOUNTER — Encounter: Payer: Self-pay | Admitting: Physical Therapy

## 2017-07-27 DIAGNOSIS — M6281 Muscle weakness (generalized): Secondary | ICD-10-CM | POA: Diagnosis not present

## 2017-07-27 DIAGNOSIS — M25551 Pain in right hip: Secondary | ICD-10-CM | POA: Diagnosis not present

## 2017-07-27 DIAGNOSIS — R600 Localized edema: Secondary | ICD-10-CM

## 2017-07-27 DIAGNOSIS — Z96641 Presence of right artificial hip joint: Secondary | ICD-10-CM

## 2017-07-27 DIAGNOSIS — M25651 Stiffness of right hip, not elsewhere classified: Secondary | ICD-10-CM | POA: Diagnosis not present

## 2017-07-27 DIAGNOSIS — R262 Difficulty in walking, not elsewhere classified: Secondary | ICD-10-CM | POA: Diagnosis not present

## 2017-07-27 NOTE — Therapy (Signed)
[place], [place], [address] Phone: [phone]   Fax:  [phone]  Physical Therapy Treatment  Patient Details  Name: [name] MRN: [id number] Date of Birth: [birth year] Referring Provider: Dr. Rod Can    Encounter Date: 07/27/2017  PT End of Session - 07/27/17 1305    Visit Number  3    Number of Visits  16    Date for PT Re-Evaluation  09/12/17    PT Start Time  1013    PT Stop Time  1108    PT Time Calculation (min)  55 min    Activity Tolerance  Patient tolerated treatment well    Behavior During Therapy  [facility] for tasks assessed/performed       Past Medical History:  Diagnosis Date  . Anemia   . Asthma   . Bursitis of left hip   . Decreased appetite   . GERD (gastroesophageal reflux disease)   . History of hiatal hernia    repaired  . History of stomach ulcers   . Hypertension   . Migraine    "maybe twice in the last 10 yrs" (10/16/2013)  . PONV (postoperative nausea and vomiting)   . Shortness of breath dyspnea     Past Surgical History:  Procedure Laterality Date  . BARTHOLIN GLAND CYST EXCISION    . BREAST LUMPECTOMY WITH RADIOACTIVE SEED LOCALIZATION Left 01/23/2016   Procedure: BREAST LUMPECTOMY WITH RADIOACTIVE SEED LOCALIZATION;  Surgeon: Jackolyn Confer, MD;  Location: [place];  Service: General;  Laterality: Left;  . CESAREAN SECTION  1989  . CHOLECYSTECTOMY  ~ 2000  . EPIGASTRIC HERNIA REPAIR  07/2003  . HERNIA REPAIR    . INCISION AND DRAINAGE ABSCESS  10/2005   Bartholin abscess.  . TONSILLECTOMY AND ADENOIDECTOMY  1983  . 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 TOTAL HIP ARTHROPLASTY Right 06/06/2017   Procedure: RIGHT TOTAL HIP ARTHROPLASTY ANTERIOR APPROACH;  Surgeon: Rod Can, MD;  Location: [place];  Service: Orthopedics;  Laterality: Right;  Needs RNFA  . TUBAL LIGATION  1994    There were no vitals filed for this visit.  Subjective Assessment -  07/27/17 1013    Subjective  Legs are just so stiff.  Didnt sleep real good last night.     Currently in Pain?  No/denies stiff                      [facility] Adult PT Treatment/Exercise - 07/27/17 0001      Knee/Hip Exercises: Stretches   Active Hamstring Stretch  Both;3 reps    Hip Flexor Stretch  Right;2 reps Used a step to support foot and abide by hip precautions     Knee/Hip Exercises: Aerobic   Nustep  6 min L5 UE and LE       Knee/Hip Exercises: Standing   Functional Squat  3 sets sit to stand     Functional Squat Limitations  add, abd and without band , no UE assist       Knee/Hip Exercises: Supine   Bridges  Strengthening;Both;1 set;10 reps    Other Supine Knee/Hip Exercises  clam green band x 10 alternating       Knee/Hip Exercises: Sidelying   Hip ABduction  Strengthening    Clams  green band x 20     Other Sidelying Knee/Hip Exercises  reverse clam x 10  2 1/2 lbs       Modalities  Modalities  Cryotherapy      Cryotherapy   Number Minutes Cryotherapy  10 Minutes    Cryotherapy Location  Hip    Type of Cryotherapy  Ice pack              PT Education - 07/27/17 1304    Education provided  No       PT Short Term Goals - 07/27/17 1305      PT SHORT TERM GOAL #1   Title  Pt will be I with HEP for ROM and strength of LE     Status  On-going      PT SHORT TERM GOAL #2   Title  Pt will verbalize ability to maintain neutral posture and utilize stretches throughout the day to manage pain     Status  On-going      PT SHORT TERM GOAL #3   Title  Pt will understand precautions and be able to verbalize limitations.     Status  Achieved        PT Long Term Goals - 07/27/17 1306      PT LONG TERM GOAL #1   Title  Pt will demo strength in Rt. hip to 4+/5 all planes for maximal gait mechanics and efficiency    Status  On-going      PT LONG TERM GOAL #2   Title  Pt will be able to perform work duties with pain <=3/10 as allowed by MD.      Status  On-going      PT LONG TERM GOAL #3   Title  FOTO to less than 50% ability to indicate significant improvement in functional ability    Status  On-going      PT LONG TERM GOAL #4   Title  Pt will be able to get into and out of cars without pain    Status  On-going            Plan - 07/27/17 1307    Clinical Impression Statement  Tape did not make a difference to her as far as swelling goes.  She is aware of precautions and can identify what she should avoid doing functionally.  She tolerates exercises without pain increase.  Goals in progress.     PT Treatment/Interventions  ADLs/Self Care Home Management;Cryotherapy;Moist Heat;Electrical Stimulation;Functional mobility training;Therapeutic activities;DME Instruction;Gait training;Stair training;Balance training;Therapeutic exercise;Neuromuscular re-education;Patient/family education;Manual techniques;Passive range of motion;Taping    PT Next Visit Plan  check HEP, manual for Rt. hip swelling,  gait, Nustep     PT Home Exercise Plan  ant hip stretching , clam and reverse clam with pillows in sidelying     Consulted and Agree with Plan of Care  Patient       Patient will benefit from skilled therapeutic intervention in order to improve the following deficits and impairments:  Abnormal gait, Decreased balance, Decreased mobility, Difficulty walking, Hypomobility, Impaired sensation, Decreased knowledge of precautions, Decreased range of motion, Decreased strength, Increased fascial restricitons, Impaired flexibility, Postural dysfunction, Pain  Visit Diagnosis: Difficulty in walking, not elsewhere classified  Pain in right hip  Status post total replacement of right hip  Muscle weakness (generalized)  Localized edema  Stiffness of right hip, not elsewhere classified     Problem List Patient Active Problem List   Diagnosis Date Noted  . Atypical chest pain   . Dysuria 03/01/2017  . Osteoarthritis of right hip  12/23/2016  . Leg pain 08/12/2016  . Papilloma of left breast  11/26/2015  . Fibrocystic changes of left breast 11/24/2015  . Chronic neck pain 06/07/2015  . GERD (gastroesophageal reflux disease) 03/08/2015  . Allergic rhinitis 03/08/2015  . Healthcare maintenance 03/08/2015  . History of cholecystectomy 03/08/2015  . Prediabetes 08/05/2014  . Hyperlipidemia 08/05/2014  . Asthma, chronic 08/04/2014  . Hypertension 08/04/2014  . Iron deficiency anemia 08/04/2014  . Obesity 08/04/2014  . History of diverticulitis 10/16/2013    PAA,JENNIFER 07/27/2017, 1:16 PM  [facility] [address] [place], [place], [address] Phone: [phone]   Fax:  [phone]  Name: Gizella L Greenhalgh MRN: [id number] Date of Birth: 1965/07/19  Raeford Razor, PT 07/27/17 1:17 PM Phone: [phone] Fax: [phone]

## 2017-08-02 ENCOUNTER — Ambulatory Visit: Payer: BLUE CROSS/BLUE SHIELD | Admitting: Physical Therapy

## 2017-08-02 ENCOUNTER — Encounter: Payer: Self-pay | Admitting: Physical Therapy

## 2017-08-02 DIAGNOSIS — R600 Localized edema: Secondary | ICD-10-CM | POA: Diagnosis not present

## 2017-08-02 DIAGNOSIS — R262 Difficulty in walking, not elsewhere classified: Secondary | ICD-10-CM

## 2017-08-02 DIAGNOSIS — M6281 Muscle weakness (generalized): Secondary | ICD-10-CM | POA: Diagnosis not present

## 2017-08-02 DIAGNOSIS — M25551 Pain in right hip: Secondary | ICD-10-CM

## 2017-08-02 DIAGNOSIS — Z96641 Presence of right artificial hip joint: Secondary | ICD-10-CM

## 2017-08-02 DIAGNOSIS — M25651 Stiffness of right hip, not elsewhere classified: Secondary | ICD-10-CM | POA: Diagnosis not present

## 2017-08-02 NOTE — Therapy (Signed)
[place], [place], [address] Phone: [phone]   Fax:  [phone]  Physical Therapy Treatment  Patient Details  Name: [name] MRN: [id number] Date of Birth: 1965-07-31 Referring Provider: Dr. Rod Can    Encounter Date: 08/02/2017  PT End of Session - 08/02/17 1030    Visit Number  4    Number of Visits  16    Date for PT Re-Evaluation  09/12/17    PT Start Time  1017    PT Stop Time  1110    PT Time Calculation (min)  53 min       Past Medical History:  Diagnosis Date  . Anemia   . Asthma   . Bursitis of left hip   . Decreased appetite   . GERD (gastroesophageal reflux disease)   . History of hiatal hernia    repaired  . History of stomach ulcers   . Hypertension   . Migraine    "maybe twice in the last 10 yrs" (10/16/2013)  . PONV (postoperative nausea and vomiting)   . Shortness of breath dyspnea     Past Surgical History:  Procedure Laterality Date  . BARTHOLIN GLAND CYST EXCISION    . BREAST LUMPECTOMY WITH RADIOACTIVE SEED LOCALIZATION Left 01/23/2016   Procedure: BREAST LUMPECTOMY WITH RADIOACTIVE SEED LOCALIZATION;  Surgeon: Jackolyn Confer, MD;  Location: [place];  Service: General;  Laterality: Left;  . CESAREAN SECTION  1989  . CHOLECYSTECTOMY  ~ 2000  . EPIGASTRIC HERNIA REPAIR  07/2003  . HERNIA REPAIR    . INCISION AND DRAINAGE ABSCESS  10/2005   Bartholin abscess.  . TONSILLECTOMY AND ADENOIDECTOMY  1983  . 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 TOTAL HIP ARTHROPLASTY Right 06/06/2017   Procedure: RIGHT TOTAL HIP ARTHROPLASTY ANTERIOR APPROACH;  Surgeon: Rod Can, MD;  Location: [place];  Service: Orthopedics;  Laterality: Right;  Needs RNFA  . TUBAL LIGATION  1994    There were no vitals filed for this visit.  Subjective Assessment - 08/02/17 1028    Subjective  Had some pain the day after last visit. Better with ice and heat. Been doing HEP and  thighs are sore.     Currently in Pain?  No/denies         [facility] PT Assessment - 08/02/17 0001      AROM   AROM Assessment Site  Hip    Right/Left Hip  Right    Right Hip Flexion  112                  [facility] Adult PT Treatment/Exercise - 08/02/17 0001      Self-Care   Other Self-Care Comments   heel lift added to left shoe- 1 layer       Knee/Hip Exercises: Stretches   Active Hamstring Stretch  Both;3 reps    Hip Flexor Stretch  Right;2 reps    Other Knee/Hip Stretches  single knee to chest 3 x 30 sec     Other Knee/Hip Stretches  butterfly stretch 3 x 30 sec      Knee/Hip Exercises: Aerobic   Nustep  6 min L5 UE and LE       Knee/Hip Exercises: Supine   Bridges  Strengthening;Both;1 set;10 reps    Single Leg Bridge  10 reps;Right      Cryotherapy   Number Minutes Cryotherapy  10 Minutes    Cryotherapy Location  Hip  Type of Cryotherapy  Ice pack      Manual Therapy   Manual Therapy  Passive ROM    Manual therapy comments  massage roller anterior thigh    Passive ROM  Gently to hip avoiding              PT Education - 08/02/17 1059    Education provided  Yes    Education Details  HEP    Person(s) Educated  Patient    Methods  Explanation;Handout    Comprehension  Verbalized understanding       PT Short Term Goals - 07/27/17 1305      PT SHORT TERM GOAL #1   Title  Pt will be I with HEP for ROM and strength of LE     Status  On-going      PT SHORT TERM GOAL #2   Title  Pt will verbalize ability to maintain neutral posture and utilize stretches throughout the day to manage pain     Status  On-going      PT SHORT TERM GOAL #3   Title  Pt will understand precautions and be able to verbalize limitations.     Status  Achieved        PT Long Term Goals - 07/27/17 1306      PT LONG TERM GOAL #1   Title  Pt will demo strength in Rt. hip to 4+/5 all planes for maximal gait mechanics and efficiency    Status  On-going      PT LONG  TERM GOAL #2   Title  Pt will be able to perform work duties with pain <=3/10 as allowed by MD.     Status  On-going      PT LONG TERM GOAL #3   Title  FOTO to less than 50% ability to indicate significant improvement in functional ability    Status  On-going      PT LONG TERM GOAL #4   Title  Pt will be able to get into and out of cars without pain    Status  On-going            Plan - 08/02/17 1055    Clinical Impression Statement  Increased hip flexion AROM to 112 degrees. She still has difficulty getting RLE in and out of car. Began single knee to chest stretching and encouraged bent knee hip flexion in standing to avoid SLR restriction. Added heel lift to left shoe, 1 layer; pt feels much better with heel lift and demonstrates decreased gait deviations.     PT Next Visit Plan  check HEP, manual for Rt. hip swelling,  gait, Nustep , review new stretches, how is heel lift?    PT Home Exercise Plan  ant hip stretching , clam and reverse clam with pillows in sidelying, single knee to chest, butterfly       Patient will benefit from skilled therapeutic intervention in order to improve the following deficits and impairments:  Abnormal gait, Decreased balance, Decreased mobility, Difficulty walking, Hypomobility, Impaired sensation, Decreased knowledge of precautions, Decreased range of motion, Decreased strength, Increased fascial restricitons, Impaired flexibility, Postural dysfunction, Pain  Visit Diagnosis: Difficulty in walking, not elsewhere classified  Pain in right hip  Status post total replacement of right hip  Muscle weakness (generalized)     Problem List Patient Active Problem List   Diagnosis Date Noted  . Atypical chest pain   . Dysuria 03/01/2017  . Osteoarthritis of right  hip 12/23/2016  . Leg pain 08/12/2016  . 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 Fibrocystic changes of left breast 11/24/2015  . Chronic neck pain 06/07/2015  . GERD (gastroesophageal  reflux disease) 03/08/2015  . Allergic rhinitis 03/08/2015  . Healthcare maintenance 03/08/2015  . History of cholecystectomy 03/08/2015  . Prediabetes 08/05/2014  . Hyperlipidemia 08/05/2014  . Asthma, chronic 08/04/2014  . Hypertension 08/04/2014  . Iron deficiency anemia 08/04/2014  . Obesity 08/04/2014  . History of diverticulitis 10/16/2013    Dorene Ar, PTA 08/02/2017, 11:21 AM  [facility] [address] [place], [place], [address] Phone: [phone]   Fax:  [phone]  Name: [name] MRN: [id number] Date of Birth: June 18, 1966

## 2017-08-02 NOTE — Patient Instructions (Signed)
Butterfly, Supine    Lie on back, feet together. Lower knees toward floor. Hold _30__ seconds. Repeat __3_ times per session. Do _2__ sessions per day.  Copyright  VHI. All rights reserved.  Knee to Chest (Flexion)    Pull knee toward chest. Feel stretch in lower back or buttock area. Breathing deeply, Hold _30___ seconds. Repeat with other knee. Repeat __3__ times. Do __2__ sessions per day.  http://gt2.exer.us/226   Copyright  VHI. All rights reserved.

## 2017-08-04 ENCOUNTER — Ambulatory Visit: Payer: BLUE CROSS/BLUE SHIELD | Admitting: Physical Therapy

## 2017-08-04 DIAGNOSIS — Z96641 Presence of right artificial hip joint: Secondary | ICD-10-CM

## 2017-08-04 DIAGNOSIS — R262 Difficulty in walking, not elsewhere classified: Secondary | ICD-10-CM

## 2017-08-04 DIAGNOSIS — R600 Localized edema: Secondary | ICD-10-CM | POA: Diagnosis not present

## 2017-08-04 DIAGNOSIS — M25651 Stiffness of right hip, not elsewhere classified: Secondary | ICD-10-CM | POA: Diagnosis not present

## 2017-08-04 DIAGNOSIS — M25551 Pain in right hip: Secondary | ICD-10-CM | POA: Diagnosis not present

## 2017-08-04 DIAGNOSIS — M6281 Muscle weakness (generalized): Secondary | ICD-10-CM

## 2017-08-04 NOTE — Therapy (Signed)
[place], [place], [address] Phone: [phone]   Fax:  [phone]  Physical Therapy Treatment  Patient Details  Name: [name] MRN: [id number] Date of Birth: 11-Oct-1965 Referring Provider: Dr. Rod Can    Encounter Date: 08/04/2017  PT End of Session - 08/04/17 1152    Visit Number  5    Number of Visits  16    Date for PT Re-Evaluation  09/12/17    PT Start Time  1751    PT Stop Time  1225    PT Time Calculation (min)  40 min       Past Medical History:  Diagnosis Date  . Anemia   . Asthma   . Bursitis of left hip   . Decreased appetite   . GERD (gastroesophageal reflux disease)   . History of hiatal hernia    repaired  . History of stomach ulcers   . Hypertension   . Migraine    "maybe twice in the last 10 yrs" (10/16/2013)  . PONV (postoperative nausea and vomiting)   . Shortness of breath dyspnea     Past Surgical History:  Procedure Laterality Date  . BARTHOLIN GLAND CYST EXCISION    . BREAST LUMPECTOMY WITH RADIOACTIVE SEED LOCALIZATION Left 01/23/2016   Procedure: BREAST LUMPECTOMY WITH RADIOACTIVE SEED LOCALIZATION;  Surgeon: Jackolyn Confer, MD;  Location: [facility];  Service: General;  Laterality: Left;  . CESAREAN SECTION  1989  . CHOLECYSTECTOMY  ~ 2000  . EPIGASTRIC HERNIA REPAIR  07/2003  . HERNIA REPAIR    . INCISION AND DRAINAGE ABSCESS  10/2005   Bartholin abscess.  . TONSILLECTOMY AND ADENOIDECTOMY  1983  . 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 TOTAL HIP ARTHROPLASTY Right 06/06/2017   Procedure: RIGHT TOTAL HIP ARTHROPLASTY ANTERIOR APPROACH;  Surgeon: Rod Can, MD;  Location: [facility];  Service: Orthopedics;  Laterality: Right;  Needs RNFA  . TUBAL LIGATION  1994    There were no vitals filed for this visit.  Subjective Assessment - 08/04/17 1150    Subjective  Felt okay after last treatment. Tried some exercises but didnt do much. Back is hurting  today.     Currently in Pain?  Yes    Pain Score  5     Pain Location  Back    Pain Orientation  Lower    Pain Radiating Towards  left thigh    Aggravating Factors   weather    Pain Relieving Factors  heat, OTC rubs     Multiple Pain Sites  No                      [facility] Adult PT Treatment/Exercise - 08/04/17 0001      Knee/Hip Exercises: Stretches   Active Hamstring Stretch  Both;3 reps    Hip Flexor Stretch  Right;2 reps    Other Knee/Hip Stretches  single knee to chest 3 x 30 sec     Other Knee/Hip Stretches  butterfly stretch 3 x 30 sec      Knee/Hip Exercises: Aerobic   Nustep  5 min L5 UE and LE       Knee/Hip Exercises: Standing   Heel Raises  10 reps    Other Standing Knee Exercises  3 way hip at counter bilateral x 10 x 2 each       Knee/Hip Exercises: Supine   Bridges  Strengthening;Both;1 set;10 reps    Single  Leg Bridge  10 reps;Right      Knee/Hip Exercises: Sidelying   Clams  green band x 20     Other Sidelying Knee/Hip Exercises  -- 2 1/2 lbs       Cryotherapy   Number Minutes Cryotherapy  10 Minutes    Cryotherapy Location  Hip    Type of Cryotherapy  Ice pack             PT Education - 08/04/17 1218    Education provided  Yes    Education Details  HEP    Person(s) Educated  Patient    Methods  Explanation;Handout    Comprehension  Verbalized understanding       PT Short Term Goals - 07/27/17 1305      PT SHORT TERM GOAL #1   Title  Pt will be I with HEP for ROM and strength of LE     Status  On-going      PT SHORT TERM GOAL #2   Title  Pt will verbalize ability to maintain neutral posture and utilize stretches throughout the day to manage pain     Status  On-going      PT SHORT TERM GOAL #3   Title  Pt will understand precautions and be able to verbalize limitations.     Status  Achieved        PT Long Term Goals - 07/27/17 1306      PT LONG TERM GOAL #1   Title  Pt will demo strength in Rt. hip to 4+/5 all  planes for maximal gait mechanics and efficiency    Status  On-going      PT LONG TERM GOAL #2   Title  Pt will be able to perform work duties with pain <=3/10 as allowed by MD.     Status  On-going      PT LONG TERM GOAL #3   Title  FOTO to less than 50% ability to indicate significant improvement in functional ability    Status  On-going      PT LONG TERM GOAL #4   Title  Pt will be able to get into and out of cars without pain    Status  On-going            Plan - 08/04/17 1153    Clinical Impression Statement  Heel lift seems to help per patient. She has not walked much. Back to work in 3 weeks. Encouragement given to increase daily activity to return to 8 hour work days. Added side clam with band to HEP. Pt reports fatigue at end of session and that she will likely sleep the rest of the day.     PT Next Visit Plan  check HEP, manual for Rt. hip swelling,  gait, Nustep , review new stretches, how is heel lift?    PT Home Exercise Plan  ant hip stretching , clam and reverse clam with pillows in sidelying, single knee to chest, butterfly, side clam green band     Consulted and Agree with Plan of Care  Patient       Patient will benefit from skilled therapeutic intervention in order to improve the following deficits and impairments:  Abnormal gait, Decreased balance, Decreased mobility, Difficulty walking, Hypomobility, Impaired sensation, Decreased knowledge of precautions, Decreased range of motion, Decreased strength, Increased fascial restricitons, Impaired flexibility, Postural dysfunction, Pain  Visit Diagnosis: Difficulty in walking, not elsewhere classified  Pain in right hip  Status  post total replacement of right hip  Muscle weakness (generalized)     Problem List Patient Active Problem List   Diagnosis Date Noted  . Atypical chest pain   . Dysuria 03/01/2017  . Osteoarthritis of right hip 12/23/2016  . Leg pain 08/12/2016  . Papilloma of left breast  11/26/2015  . Fibrocystic changes of left breast 11/24/2015  . Chronic neck pain 06/07/2015  . GERD (gastroesophageal reflux disease) 03/08/2015  . Allergic rhinitis 03/08/2015  . Healthcare maintenance 03/08/2015  . History of cholecystectomy 03/08/2015  . Prediabetes 08/05/2014  . Hyperlipidemia 08/05/2014  . Asthma, chronic 08/04/2014  . Hypertension 08/04/2014  . Iron deficiency anemia 08/04/2014  . Obesity 08/04/2014  . History of diverticulitis 10/16/2013    Dorene Ar, PTA 08/04/2017, 12:44 PM  [place], [place], [address] Phone: [phone]   Fax:  [phone]  Name: Sheryl L Base MRN: [id number] Date of Birth: [birth year]

## 2017-08-04 NOTE — Patient Instructions (Signed)
Abduction: Clam (Eccentric) - Side-Lying   Place green band around knees  Lie on side with knees bent. Lift top knee, keeping feet together. Keep trunk steady. Slowly lower for 3-5 seconds. __10_ reps per set, _2__ sets per day, __7_ days per week.

## 2017-08-08 ENCOUNTER — Encounter: Payer: Self-pay | Admitting: Physical Therapy

## 2017-08-08 ENCOUNTER — Ambulatory Visit: Payer: BLUE CROSS/BLUE SHIELD | Attending: Orthopedic Surgery | Admitting: Physical Therapy

## 2017-08-08 DIAGNOSIS — M25651 Stiffness of right hip, not elsewhere classified: Secondary | ICD-10-CM | POA: Insufficient documentation

## 2017-08-08 DIAGNOSIS — M25551 Pain in right hip: Secondary | ICD-10-CM | POA: Diagnosis not present

## 2017-08-08 DIAGNOSIS — Z96641 Presence of right artificial hip joint: Secondary | ICD-10-CM | POA: Diagnosis not present

## 2017-08-08 DIAGNOSIS — R262 Difficulty in walking, not elsewhere classified: Secondary | ICD-10-CM | POA: Diagnosis not present

## 2017-08-08 DIAGNOSIS — R600 Localized edema: Secondary | ICD-10-CM | POA: Diagnosis not present

## 2017-08-08 DIAGNOSIS — M6281 Muscle weakness (generalized): Secondary | ICD-10-CM | POA: Insufficient documentation

## 2017-08-08 NOTE — Therapy (Signed)
[place], [place], [address] Phone: [phone]   Fax:  [phone]  Physical Therapy Treatment  Patient Details  Name: [name] MRN: [id number] Date of Birth: Nov 04, 1965 Referring Provider: Dr. Rod Can    Encounter Date: 08/08/2017  PT End of Session - 08/08/17 1029    Visit Number  6    Number of Visits  16    Date for PT Re-Evaluation  09/12/17    PT Start Time  1018    PT Stop Time  1110    PT Time Calculation (min)  52 min       Past Medical History:  Diagnosis Date  . Anemia   . Asthma   . Bursitis of left hip   . Decreased appetite   . GERD (gastroesophageal reflux disease)   . History of hiatal hernia    repaired  . History of stomach ulcers   . Hypertension   . Migraine    "maybe twice in the last 10 yrs" (10/16/2013)  . PONV (postoperative nausea and vomiting)   . Shortness of breath dyspnea     Past Surgical History:  Procedure Laterality Date  . BARTHOLIN GLAND CYST EXCISION    . BREAST LUMPECTOMY WITH RADIOACTIVE SEED LOCALIZATION Left 01/23/2016   Procedure: BREAST LUMPECTOMY WITH RADIOACTIVE SEED LOCALIZATION;  Surgeon: Jackolyn Confer, MD;  Location: [employer] [facility];  Service: General;  Laterality: Left;  . CESAREAN SECTION  1989  . CHOLECYSTECTOMY  ~ 2000  . EPIGASTRIC HERNIA REPAIR  07/2003  . HERNIA REPAIR    . INCISION AND DRAINAGE ABSCESS  10/2005   Bartholin abscess.  . TONSILLECTOMY AND ADENOIDECTOMY  1983  . 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 TOTAL HIP ARTHROPLASTY Right 06/06/2017   Procedure: RIGHT TOTAL HIP ARTHROPLASTY ANTERIOR APPROACH;  Surgeon: Rod Can, MD;  Location: [facility];  Service: Orthopedics;  Laterality: Right;  Needs RNFA  . TUBAL LIGATION  1994    There were no vitals filed for this visit.  Subjective Assessment - 08/08/17 1022    Currently in Pain?  Yes    Pain Score  5     Pain Location  Back    Pain Orientation  Lower;Mid     Pain Descriptors / Indicators  Aching    Aggravating Factors   walking greater than 5 minutes     Pain Relieving Factors  heat, OTC rubs     Pain Score  0    Pain Location  Hip         [facility] PT Assessment - 08/08/17 0001      Strength   Right Hip Flexion  4-/5    Right Hip ABduction  4/5                  [facility] Adult PT Treatment/Exercise - 08/08/17 0001      Lumbar Exercises: Supine   Ab Set  10 reps    Clam  10 reps    Clam Limitations  unilateral and bilateral     Bent Knee Raise  10 reps      Knee/Hip Exercises: Stretches   Hip Flexor Stretch  Right;2 reps    Other Knee/Hip Stretches  single knee to chest 3 x 30 sec       Knee/Hip Exercises: Aerobic   Nustep  5 min L5 UE and LE       Knee/Hip Exercises: Supine   Bridges  Strengthening;Both;1 set;10 reps  Bridges with Clamshell  10 reps    Single Leg Bridge  10 reps;Right    Other Supine Knee/Hip Exercises  pelvic tilt x 10 anterior and posterior , anterior and posterior       Knee/Hip Exercises: Sidelying   Clams  green band x 20       Cryotherapy   Number Minutes Cryotherapy  10 Minutes    Cryotherapy Location  Hip    Type of Cryotherapy  Ice pack             PT Education - 08/08/17 1141    Education provided  Yes    Education Details  HEP    Person(s) Educated  Patient    Methods  Explanation;Handout    Comprehension  Verbalized understanding       PT Short Term Goals - 08/08/17 1119      PT SHORT TERM GOAL #1   Title  Pt will be I with HEP for ROM and strength of LE     Status  Achieved      PT SHORT TERM GOAL #2   Title  Pt will verbalize ability to maintain neutral posture and utilize stretches throughout the day to manage pain     Status  Achieved      PT SHORT TERM GOAL #3   Title  Pt will understand precautions and be able to verbalize limitations.     Status  Achieved        PT Long Term Goals - 07/27/17 1306      PT LONG TERM GOAL #1   Title  Pt will demo  strength in Rt. hip to 4+/5 all planes for maximal gait mechanics and efficiency    Status  On-going      PT LONG TERM GOAL #2   Title  Pt will be able to perform work duties with pain <=3/10 as allowed by MD.     Status  On-going      PT LONG TERM GOAL #3   Title  FOTO to less than 50% ability to indicate significant improvement in functional ability    Status  On-going      PT LONG TERM GOAL #4   Title  Pt will be able to get into and out of cars without pain    Status  On-going            Plan - 08/08/17 1045    Clinical Impression Statement  Pt reports she has attempted to walk to increase activity prior to RTW. She notes lower back pain after 5 minutes of walking. Began abdominal activation exercises and updated HEP. Hip abduction and flexion MMT improved. Progressing toward LTGS, all STGs met. Less pain with getting in and out of car, still a little sore and stiff.     PT Next Visit Plan  check HEP- review and continue core, , manual for Rt. hip swelling,  REVIEW GAIT, Nustep , review new stretches, how is heel lift?    PT Home Exercise Plan  ant hip stretching , clam and reverse clam with pillows in sidelying, single knee to chest, butterfly, side clam green band, prepilates neutral, clam and bent knee raise    Consulted and Agree with Plan of Care  Patient       Patient will benefit from skilled therapeutic intervention in order to improve the following deficits and impairments:  Abnormal gait, Decreased balance, Decreased mobility, Difficulty walking, Hypomobility, Impaired sensation, Decreased knowledge of precautions,  Decreased range of motion, Decreased strength, Increased fascial restricitons, Impaired flexibility, Postural dysfunction, Pain  Visit Diagnosis: Difficulty in walking, not elsewhere classified  Pain in right hip  Status post total replacement of right hip  Muscle weakness (generalized)  Localized edema     Problem List Patient Active Problem  List   Diagnosis Date Noted  . Atypical chest pain   . Dysuria 03/01/2017  . Osteoarthritis of right hip 12/23/2016  . Leg pain 08/12/2016  . 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 Fibrocystic changes of left breast 11/24/2015  . Chronic neck pain 06/07/2015  . GERD (gastroesophageal reflux disease) 03/08/2015  . Allergic rhinitis 03/08/2015  . Healthcare maintenance 03/08/2015  . History of cholecystectomy 03/08/2015  . Prediabetes 08/05/2014  . Hyperlipidemia 08/05/2014  . Asthma, chronic 08/04/2014  . Hypertension 08/04/2014  . Iron deficiency anemia 08/04/2014  . Obesity 08/04/2014  . History of diverticulitis 10/16/2013    Dorene Ar, PTA 08/08/2017, 11:42 AM  [facility] [address] [place], [place], [address] Phone: [phone]   Fax:  [phone]  Name: [name] MRN: [id number] Date of Birth: 12-29-1965

## 2017-08-10 ENCOUNTER — Encounter: Payer: Self-pay | Admitting: Physical Therapy

## 2017-08-10 ENCOUNTER — Ambulatory Visit: Payer: BLUE CROSS/BLUE SHIELD | Admitting: Physical Therapy

## 2017-08-10 DIAGNOSIS — M25551 Pain in right hip: Secondary | ICD-10-CM

## 2017-08-10 DIAGNOSIS — M6281 Muscle weakness (generalized): Secondary | ICD-10-CM

## 2017-08-10 DIAGNOSIS — R600 Localized edema: Secondary | ICD-10-CM | POA: Diagnosis not present

## 2017-08-10 DIAGNOSIS — M25651 Stiffness of right hip, not elsewhere classified: Secondary | ICD-10-CM

## 2017-08-10 DIAGNOSIS — Z96641 Presence of right artificial hip joint: Secondary | ICD-10-CM | POA: Diagnosis not present

## 2017-08-10 DIAGNOSIS — R262 Difficulty in walking, not elsewhere classified: Secondary | ICD-10-CM | POA: Diagnosis not present

## 2017-08-10 NOTE — Therapy (Signed)
[place], [place], [address] Phone: [phone]   Fax:  [phone]  Physical Therapy Treatment  Patient Details  Name: [name] MRN: [id number] Date of Birth: [birth year] Referring Provider: Dr. Rod Can    Encounter Date: 08/10/2017  PT End of Session - 08/10/17 1036    Visit Number  7    Number of Visits  16    Date for PT Re-Evaluation  09/12/17    PT Start Time  1018    PT Stop Time  1110    PT Time Calculation (min)  52 min       Past Medical History:  Diagnosis Date  . Anemia   . Asthma   . Bursitis of left hip   . Decreased appetite   . GERD (gastroesophageal reflux disease)   . History of hiatal hernia    repaired  . History of stomach ulcers   . Hypertension   . Migraine    "maybe twice in the last 10 yrs" (10/16/2013)  . PONV (postoperative nausea and vomiting)   . Shortness of breath dyspnea     Past Surgical History:  Procedure Laterality Date  . BARTHOLIN GLAND CYST EXCISION    . BREAST LUMPECTOMY WITH RADIOACTIVE SEED LOCALIZATION Left 01/23/2016   Procedure: BREAST LUMPECTOMY WITH RADIOACTIVE SEED LOCALIZATION;  Surgeon: Jackolyn Confer, MD;  Location: [facility];  Service: General;  Laterality: Left;  . CESAREAN SECTION  1989  . CHOLECYSTECTOMY  ~ 2000  . EPIGASTRIC HERNIA REPAIR  07/2003  . HERNIA REPAIR    . INCISION AND DRAINAGE ABSCESS  10/2005   Bartholin abscess.  . TONSILLECTOMY AND ADENOIDECTOMY  1983  . 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 TOTAL HIP ARTHROPLASTY Right 06/06/2017   Procedure: RIGHT TOTAL HIP ARTHROPLASTY ANTERIOR APPROACH;  Surgeon: Rod Can, MD;  Location: [facility];  Service: Orthopedics;  Laterality: Right;  Needs RNFA  . TUBAL LIGATION  1994    There were no vitals filed for this visit.  Subjective Assessment - 08/10/17 1022    Subjective  Return to work Feb 25th. No pain today. Back and hip feeling good. Had a fish fry outside  yesterday, lots of up and down, did well.     Currently in Pain?  No/denies                      [facility] Adult PT Treatment/Exercise - 08/10/17 0001      Knee/Hip Exercises: Aerobic   Nustep  6 min L5 UE and LE       Knee/Hip Exercises: Standing   Hip Flexion  2 sets;10 reps 2#    Lateral Step Up  2 sets;10 reps;Step Height: 6"    Forward Step Up  2 sets;10 reps;Step Height: 6"    Functional Squat  1 set sit to stand     Functional Squat Limitations  green band into ER     Gait Training  Reciprocal stairs up and down- 1 hand rail needed for descending     Other Standing Knee Exercises  3 way hip at counter bilateral x 10 x 2 each  2#       Knee/Hip Exercises: Supine   Bridges with Clamshell  10 reps      Knee/Hip Exercises: Sidelying   Clams  green band x 20                PT Short Term Goals -  08/08/17 1119      PT SHORT TERM GOAL #1   Title  Pt will be I with HEP for ROM and strength of LE     Status  Achieved      PT SHORT TERM GOAL #2   Title  Pt will verbalize ability to maintain neutral posture and utilize stretches throughout the day to manage pain     Status  Achieved      PT SHORT TERM GOAL #3   Title  Pt will understand precautions and be able to verbalize limitations.     Status  Achieved        PT Long Term Goals - 07/27/17 1306      PT LONG TERM GOAL #1   Title  Pt will demo strength in Rt. hip to 4+/5 all planes for maximal gait mechanics and efficiency    Status  On-going      PT LONG TERM GOAL #2   Title  Pt will be able to perform work duties with pain <=3/10 as allowed by MD.     Status  On-going      PT LONG TERM GOAL #3   Title  FOTO to less than 50% ability to indicate significant improvement in functional ability    Status  On-going      PT LONG TERM GOAL #4   Title  Pt will be able to get into and out of cars without pain    Status  On-going            Plan - 08/10/17 1024    Clinical Impression Statement   Pt with no pain today. Continued core, hip exercises and functional strengthening. Able to climb reciprocal stairs without handrails. Needs one handrail to descend. Began forward and lateral step ups without increased pain. Can feel muscle in anterior thigh working.     PT Next Visit Plan  FOTO ; check HEP-continue step ups/  review and continue core, gait    PT Home Exercise Plan  ant hip stretching , clam and reverse clam with pillows in sidelying, single knee to chest, butterfly, side clam green band, prepilates neutral, clam and bent knee raise    Consulted and Agree with Plan of Care  Patient       Patient will benefit from skilled therapeutic intervention in order to improve the following deficits and impairments:  Abnormal gait, Decreased balance, Decreased mobility, Difficulty walking, Hypomobility, Impaired sensation, Decreased knowledge of precautions, Decreased range of motion, Decreased strength, Increased fascial restricitons, Impaired flexibility, Postural dysfunction, Pain  Visit Diagnosis: Difficulty in walking, not elsewhere classified  Pain in right hip  Status post total replacement of right hip  Muscle weakness (generalized)  Localized edema  Stiffness of right hip, not elsewhere classified     Problem List Patient Active Problem List   Diagnosis Date Noted  . Atypical chest pain   . Dysuria 03/01/2017  . Osteoarthritis of right hip 12/23/2016  . Leg pain 08/12/2016  . 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 Fibrocystic changes of left breast 11/24/2015  . Chronic neck pain 06/07/2015  . GERD (gastroesophageal reflux disease) 03/08/2015  . Allergic rhinitis 03/08/2015  . Healthcare maintenance 03/08/2015  . History of cholecystectomy 03/08/2015  . Prediabetes 08/05/2014  . Hyperlipidemia 08/05/2014  . Asthma, chronic 08/04/2014  . Hypertension 08/04/2014  . Iron deficiency anemia 08/04/2014  . Obesity 08/04/2014  . History of diverticulitis 10/16/2013     Dorene Ar, PTA  08/10/2017, 1:13 PM  [place], [place], [address] Phone: [phone]   Fax:  [phone]  Name: [name] MRN: [id number] Date of Birth: 1966/06/05

## 2017-08-15 ENCOUNTER — Encounter: Payer: BLUE CROSS/BLUE SHIELD | Admitting: Physical Therapy

## 2017-08-17 ENCOUNTER — Encounter: Payer: Self-pay | Admitting: Physical Therapy

## 2017-08-17 ENCOUNTER — Ambulatory Visit: Payer: BLUE CROSS/BLUE SHIELD | Admitting: Physical Therapy

## 2017-08-17 DIAGNOSIS — M25651 Stiffness of right hip, not elsewhere classified: Secondary | ICD-10-CM | POA: Diagnosis not present

## 2017-08-17 DIAGNOSIS — R262 Difficulty in walking, not elsewhere classified: Secondary | ICD-10-CM

## 2017-08-17 DIAGNOSIS — M25551 Pain in right hip: Secondary | ICD-10-CM | POA: Diagnosis not present

## 2017-08-17 DIAGNOSIS — Z96641 Presence of right artificial hip joint: Secondary | ICD-10-CM | POA: Diagnosis not present

## 2017-08-17 DIAGNOSIS — M6281 Muscle weakness (generalized): Secondary | ICD-10-CM | POA: Diagnosis not present

## 2017-08-17 DIAGNOSIS — R600 Localized edema: Secondary | ICD-10-CM | POA: Diagnosis not present

## 2017-08-17 NOTE — Therapy (Addendum)
[place], [place], [address] Phone: [phone]   Fax:  [phone]  Physical Therapy Treatment/Discharge Summary  Patient Details  Name: [name] MRN: [id number] Date of Birth: [birth year] Referring Provider: Dr. Rod Can    Encounter Date: 08/17/2017  PT End of Session - 08/17/17 1016    Visit Number  8    Number of Visits  16    Date for PT Re-Evaluation  09/12/17    PT Start Time  1015    PT Stop Time  1055    PT Time Calculation (min)  40 min    Activity Tolerance  Patient tolerated treatment well    Behavior During Therapy  [facility] for tasks assessed/performed       Past Medical History:  Diagnosis Date  . Anemia   . Asthma   . Bursitis of left hip   . Decreased appetite   . GERD (gastroesophageal reflux disease)   . History of hiatal hernia    repaired  . History of stomach ulcers   . Hypertension   . Migraine    "maybe twice in the last 10 yrs" (10/16/2013)  . PONV (postoperative nausea and vomiting)   . Shortness of breath dyspnea     Past Surgical History:  Procedure Laterality Date  . BARTHOLIN GLAND CYST EXCISION    . BREAST LUMPECTOMY WITH RADIOACTIVE SEED LOCALIZATION Left 01/23/2016   Procedure: BREAST LUMPECTOMY WITH RADIOACTIVE SEED LOCALIZATION;  Surgeon: Jackolyn Confer, MD;  Location: [place];  Service: General;  Laterality: Left;  . CESAREAN SECTION  1989  . CHOLECYSTECTOMY  ~ 2000  . EPIGASTRIC HERNIA REPAIR  07/2003  . HERNIA REPAIR    . INCISION AND DRAINAGE ABSCESS  10/2005   Bartholin abscess.  . TONSILLECTOMY AND ADENOIDECTOMY  1983  . 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 TOTAL HIP ARTHROPLASTY Right 06/06/2017   Procedure: RIGHT TOTAL HIP ARTHROPLASTY ANTERIOR APPROACH;  Surgeon: Rod Can, MD;  Location: [facility];  Service: Orthopedics;  Laterality: Right;  Needs RNFA  . TUBAL LIGATION  1994    There were no vitals filed for this  visit.  Subjective Assessment - 08/17/17 1016    Subjective  Going back to work Monday. Not really having any pain, just a little muscle soreness. Walked around a lot yesterday, sometimes it is a little rough but I take breaks. Denies sharp pain with activities.     How long can you sit comfortably?  1 hour    How long can you stand comfortably?  1 hour    How long can you walk comfortably?  30 min, starts to feel LBP    Patient Stated Goals  Pt would like to return to work and move without difficulty     Currently in Pain?  No/denies    Aggravating Factors   walking 30+ min hurts low back    Pain Relieving Factors  rest breaks, heat         [facility] PT Assessment - 08/17/17 0001      Observation/Other Assessments   Focus on Therapeutic Outcomes (FOTO)   42% limited      AROM   Right Hip Flexion  112    Right Hip External Rotation   40    Right Hip Internal Rotation   30      Strength   Right Hip Flexion  4/5    Right Hip External Rotation   4+/5  Right Hip Internal Rotation  4+/5    Right Hip ABduction  4/5    Right Knee Flexion  5/5    Right Knee Extension  5/5                  [facility] Adult PT Treatment/Exercise - 08/17/17 0001      Knee/Hip Exercises: Aerobic   Nustep  5 min L5 LE only      Knee/Hip Exercises: Standing   Heel Raises  20 reps facing wall, arms OH    Wall Squat  15 reps      Knee/Hip Exercises: Supine   Other Supine Knee/Hip Exercises  abdominal isometrics physioball, x10 center, x10 each single arm    Other Supine Knee/Hip Exercises  dead bug             PT Education - 08/17/17 1058    Education provided  Yes    Education Details  goals, FOTO, exercise form/rationale, HEP, POC    Person(s) Educated  Patient    Methods  Explanation;Demonstration;Tactile cues;Verbal cues;Handout    Comprehension  Verbalized understanding;Need further instruction;Returned demonstration;Verbal cues required;Tactile cues required       PT Short  Term Goals - 08/08/17 1119      PT SHORT TERM GOAL #1   Title  Pt will be I with HEP for ROM and strength of LE     Status  Achieved      PT SHORT TERM GOAL #2   Title  Pt will verbalize ability to maintain neutral posture and utilize stretches throughout the day to manage pain     Status  Achieved      PT SHORT TERM GOAL #3   Title  Pt will understand precautions and be able to verbalize limitations.     Status  Achieved        PT Long Term Goals - 08/17/17 1022      PT LONG TERM GOAL #1   Title  Pt will demo strength in Rt. hip to 4+/5 all planes for maximal gait mechanics and efficiency    Baseline  see flowsheet    Status  On-going      PT LONG TERM GOAL #2   Title  Pt will be able to perform work duties with pain <=3/10 as allowed by MD.     Baseline  has not returned to work at this time, probably to 8/10 with activities that mimick work at home    Status  On-going      PT [place] #3   Title  FOTO to less than 50% ability to indicate significant improvement in functional ability    Baseline  42% limitation    Status  Achieved      PT LONG TERM GOAL #4   Title  Pt will be able to get into and out of cars without pain    Baseline  no longer needs to use hands, just muscle soreness to lift leg    Status  Partially Met            Plan - 08/17/17 1055    Clinical Impression Statement  Pt making good progress toward goals with some deficits still to be addressed. Is scheduled to return to work on 2/25 but thinks she is going back on Monday because she is tired of not working. Avised her to take rest breaks after about 30 min of work at a time to avoid reaching severe levels of  pain during the day. Pt will continue to benefit from functional strength and endurance training to return to PLOF and reach long term functional goals.     PT Treatment/Interventions  ADLs/Self Care Home Management;Cryotherapy;Moist Heat;Electrical Stimulation;Functional mobility  training;Therapeutic activities;DME Instruction;Gait training;Stair training;Balance training;Therapeutic exercise;Neuromuscular re-education;Patient/family education;Manual techniques;Passive range of motion;Taping    PT Next Visit Plan  core endurance, continue step ups, reduce antalgic gait pattern    PT Home Exercise Plan  ant hip stretching , clam and reverse clam with pillows in sidelying, single knee to chest, butterfly, side clam green band, prepilates neutral, clam and bent knee raise; dead bug    Consulted and Agree with Plan of Care  Patient       Patient will benefit from skilled therapeutic intervention in order to improve the following deficits and impairments:  Abnormal gait, Decreased balance, Decreased mobility, Difficulty walking, Hypomobility, Impaired sensation, Decreased knowledge of precautions, Decreased range of motion, Decreased strength, Increased fascial restricitons, Impaired flexibility, Postural dysfunction, Pain  Visit Diagnosis: Difficulty in walking, not elsewhere classified  Pain in right hip     Problem List Patient Active Problem List   Diagnosis Date Noted  . Atypical chest pain   . Dysuria 03/01/2017  . Osteoarthritis of right hip 12/23/2016  . Leg pain 08/12/2016  . 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 Fibrocystic changes of left breast 11/24/2015  . Chronic neck pain 06/07/2015  . GERD (gastroesophageal reflux disease) 03/08/2015  . Allergic rhinitis 03/08/2015  . Healthcare maintenance 03/08/2015  . History of cholecystectomy 03/08/2015  . Prediabetes 08/05/2014  . Hyperlipidemia 08/05/2014  . Asthma, chronic 08/04/2014  . Hypertension 08/04/2014  . Iron deficiency anemia 08/04/2014  . Obesity 08/04/2014  . History of diverticulitis 10/16/2013    Quaniyah Bugh C. Kazaria Gaertner PT, DPT 08/17/17 10:59 AM   [facility] [address] [place], [place], [address] Phone: [phone]   Fax:   [phone]  Name: [name] MRN: [id number] Date of Birth: June 11, 1966  PHYSICAL THERAPY DISCHARGE SUMMARY  Visits from Start of Care: 8  Current functional level related to goals / functional outcomes: See above   Remaining deficits: See above   Education / Equipment: Anatomy of condition, POC, HEP, exercise form//rationale  Plan: Patient agrees to discharge.  Patient goals were partially met. Patient is being discharged due to not returning since the last visit.  ?????     Abri Vacca C. Luwanda Starr PT, DPT 10/10/17 10:42 AM

## 2017-09-29 ENCOUNTER — Other Ambulatory Visit: Payer: Self-pay

## 2017-09-29 MED ORDER — ALBUTEROL SULFATE (2.5 MG/3ML) 0.083% IN NEBU: 2.5000 mg | INHALATION_SOLUTION | Freq: Four times a day (QID) | RESPIRATORY_TRACT | 2 refills | Status: DC | PRN

## 2017-09-29 NOTE — Telephone Encounter (Signed)
albuterol (PROVENTIL) (2.5 MG/3ML) 0.083% nebulizer solution, REFILL REQUEST @ [facility].

## 2017-12-03 ENCOUNTER — Other Ambulatory Visit: Payer: Self-pay | Admitting: Internal Medicine

## 2018-01-09 ENCOUNTER — Encounter: Payer: No Typology Code available for payment source | Admitting: Internal Medicine

## 2018-01-18 DIAGNOSIS — Z471 Aftercare following joint replacement surgery: Secondary | ICD-10-CM | POA: Diagnosis not present

## 2018-01-18 DIAGNOSIS — M1611 Unilateral primary osteoarthritis, right hip: Secondary | ICD-10-CM | POA: Diagnosis not present

## 2018-01-18 DIAGNOSIS — Z96641 Presence of right artificial hip joint: Secondary | ICD-10-CM | POA: Diagnosis not present

## 2018-02-13 DIAGNOSIS — M25551 Pain in right hip: Secondary | ICD-10-CM | POA: Diagnosis not present

## 2018-02-17 DIAGNOSIS — M25559 Pain in unspecified hip: Secondary | ICD-10-CM | POA: Diagnosis not present

## 2018-02-20 ENCOUNTER — Other Ambulatory Visit: Payer: Self-pay

## 2018-02-20 ENCOUNTER — Encounter: Payer: Self-pay | Admitting: Internal Medicine

## 2018-02-20 ENCOUNTER — Ambulatory Visit (INDEPENDENT_AMBULATORY_CARE_PROVIDER_SITE_OTHER): Payer: No Typology Code available for payment source | Admitting: Internal Medicine

## 2018-02-20 VITALS — BP 139/72 | HR 86 | Temp 98.5°F | Ht 61.0 in | Wt 177.1 lb

## 2018-02-20 DIAGNOSIS — Z96641 Presence of right artificial hip joint: Secondary | ICD-10-CM

## 2018-02-20 DIAGNOSIS — M1611 Unilateral primary osteoarthritis, right hip: Secondary | ICD-10-CM

## 2018-02-20 DIAGNOSIS — R519 Headache, unspecified: Secondary | ICD-10-CM

## 2018-02-20 DIAGNOSIS — M25559 Pain in unspecified hip: Secondary | ICD-10-CM | POA: Diagnosis not present

## 2018-02-20 DIAGNOSIS — R21 Rash and other nonspecific skin eruption: Secondary | ICD-10-CM | POA: Insufficient documentation

## 2018-02-20 DIAGNOSIS — J4520 Mild intermittent asthma, uncomplicated: Secondary | ICD-10-CM

## 2018-02-20 DIAGNOSIS — Z79899 Other long term (current) drug therapy: Secondary | ICD-10-CM

## 2018-02-20 DIAGNOSIS — J4521 Mild intermittent asthma with (acute) exacerbation: Secondary | ICD-10-CM

## 2018-02-20 DIAGNOSIS — I10 Essential (primary) hypertension: Secondary | ICD-10-CM

## 2018-02-20 DIAGNOSIS — L819 Disorder of pigmentation, unspecified: Secondary | ICD-10-CM

## 2018-02-20 DIAGNOSIS — Z23 Encounter for immunization: Secondary | ICD-10-CM

## 2018-02-20 DIAGNOSIS — J45909 Unspecified asthma, uncomplicated: Secondary | ICD-10-CM | POA: Insufficient documentation

## 2018-02-20 DIAGNOSIS — R51 Headache: Secondary | ICD-10-CM

## 2018-02-20 MED ORDER — BUDESONIDE-FORMOTEROL FUMARATE 80-4.5 MCG/ACT IN AERO: INHALATION_SPRAY | RESPIRATORY_TRACT | 11 refills | Status: DC

## 2018-02-20 MED ORDER — AMLODIPINE BESYLATE 10 MG PO TABS: 10.0000 mg | ORAL_TABLET | Freq: Every day | ORAL | 6 refills | Status: DC

## 2018-02-20 MED ORDER — ALBUTEROL SULFATE HFA 108 (90 BASE) MCG/ACT IN AERS: 2.0000 | INHALATION_SPRAY | Freq: Four times a day (QID) | RESPIRATORY_TRACT | 2 refills | Status: DC | PRN

## 2018-02-20 MED ORDER — KETOCONAZOLE 2 % EX CREA: 1.0000 "application " | TOPICAL_CREAM | Freq: Every day | CUTANEOUS | 0 refills | Status: DC

## 2018-02-20 NOTE — Assessment & Plan Note (Signed)
She has no red flags. Symptoms are more consistent with disturbance in her daily sleep pattern.  She was advised to follow a proper sleep pattern, try to get 7 to 8 hours of sleep and can use Tylenol as needed.

## 2018-02-20 NOTE — Progress Notes (Signed)
   CC: Dryness and hyperpigmentation around nasal folds.  HPI:  [name] is a 52 y.o. with past medical history as listed below came to the clinic with complaint of worsening dryness and hyperpigmentation around the nasal fold.  Her symptoms started approximately 2 to 3 weeks ago after staying in sun at the beach, initially patient thought it is a sunburn and was treating with aloe vera with no relief.  Her lesions continue to spread, now multiple patches on the bridge of her nose, nasal fold and on her forehead.  She was complaining of burning sensations in her lesions, denies itching.  She denies any exudate.  She was also complaining of almost daily headaches.  Headaches are mostly bandlike, associated with mild photophobia occasionally, no nausea, vomiting, blurry vision or sinus congestion.  Headache started after taking an another job, now she works with 2 jobs and 1 of that requiring fluctuating shifts during days and nights.  According to patient her sleep cycle is very disturbed.  Getting good sleep and Tylenol help with her headaches.  Please see assessment and plan for her chronic conditions.  Past Medical History:  Diagnosis Date  . Anemia   . Asthma   . Bursitis of left hip   . Decreased appetite   . GERD (gastroesophageal reflux disease)   . History of hiatal hernia    repaired  . History of stomach ulcers   . Hypertension   . Migraine    "maybe twice in the last 10 yrs" (10/16/2013)  . PONV (postoperative nausea and vomiting)   . Shortness of breath dyspnea    Review of Systems: Negative except mentioned in HPI.  Physical Exam:  Vitals:   02/20/18 1508  BP: 139/72  Pulse: 86  Temp: 98.5 F (36.9 C)  TempSrc: Oral  SpO2: 100%  Weight: 177 lb 1.6 oz (80.3 kg)  Height: 5\' 1"  (1.549 m)   General: Vital signs reviewed.  Patient is well-developed and well-nourished, in no acute distress and cooperative with exam.  Head: Normocephalic and atraumatic. Eyes:  EOMI, conjunctivae normal, no scleral icterus.  Neck: Supple, trachea midline, normal ROM, no JVD, masses, thyromegaly, or carotid bruit present.  Cardiovascular: RRR, S1 normal, S2 normal, no murmurs, gallops, or rubs. Pulmonary/Chest: Clear to auscultation bilaterally, no wheezes, rales, or rhonchi. Abdominal: Soft, non-tender, non-distended, BS +, no masses, organomegaly, or guarding present.  Extremities: No lower extremity edema bilaterally,  pulses symmetric and intact bilaterally. No cyanosis or clubbing. Neurological: A&O x3, Strength is normal and symmetric bilaterally, cranial nerve II-XII are grossly intact, no focal motor deficit, sensory intact to light touch bilaterally.  Skin: Warm, dry and intact.  Multiple hypopigmented patches over the bridge of nose and nasal fold.  2 patches over the forehead on medial side of eyebrows which appears scaly. Psychiatric: Normal mood and affect. speech and behavior is normal. Cognition and memory are normal.      Assessment & Plan:   See Encounters Tab for problem based charting.  Patient discussed with Dr. Dareen Piano.

## 2018-02-20 NOTE — Assessment & Plan Note (Signed)
Flu shot was provided. 

## 2018-02-20 NOTE — Patient Instructions (Addendum)
Thank you for visiting clinic today. I am giving you a cream to put on your lesions on your face, you will use it once daily, you should be seen some difference in 1 to 2 weeks, if your symptoms get worse or fail to resolve in the next few weeks please follow-up in the clinic. Continue taking your blood pressure medicine as directed. Please follow-up in 32-month.

## 2018-02-20 NOTE — Assessment & Plan Note (Signed)
BP Readings from Last 3 Encounters:  02/20/18 139/72  06/09/17 140/63  06/01/17 (!) 142/72   She is compliant with amlodipine.  Denies any dizziness. Her blood pressure was mildly elevated.  Continue current management with amlodipine 10 mg daily, will reassess during next follow-up visit.

## 2018-02-20 NOTE — Assessment & Plan Note (Signed)
Her hip pain has been improved after right hip replacement surgery. She continued to get mild discomfort and still getting physical therapy which helped with her symptoms.  She was advised to continue physical therapy and follow-up with her orthopedic surgeon if needed.

## 2018-02-20 NOTE — Assessment & Plan Note (Signed)
According to patient she had one acute exacerbation approximately a week ago, she is to get asthma exacerbation with heat and humidity. She ran out of her albuterol and asking for a refill which was provided.  No shortness of breath or difficulty breathing today. Chest was clear, no wheezing.

## 2018-02-20 NOTE — Assessment & Plan Note (Signed)
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  Ketoconazole 2% cream-to be applied daily on those lesions. If her symptoms get worse or fail to resolve we will send her to see a  dermatologist.

## 2018-02-21 NOTE — Progress Notes (Signed)
Internal Medicine Clinic Attending  Case discussed with Dr. Amin at the time of the visit.  We reviewed the resident's history and exam and pertinent patient test results.  I agree with the assessment, diagnosis, and plan of care documented in the resident's note.    

## 2018-02-24 DIAGNOSIS — M25551 Pain in right hip: Secondary | ICD-10-CM | POA: Diagnosis not present

## 2018-03-01 DIAGNOSIS — M25551 Pain in right hip: Secondary | ICD-10-CM | POA: Diagnosis not present

## 2018-04-05 DIAGNOSIS — M25551 Pain in right hip: Secondary | ICD-10-CM | POA: Diagnosis not present

## 2018-04-06 ENCOUNTER — Telehealth: Payer: Self-pay | Admitting: *Deleted

## 2018-04-06 NOTE — Telephone Encounter (Signed)
Pt called for proof of flu vaccine, printed, placed in envelope and taken to front desk, she will pick up at appr 1530 today

## 2018-04-12 DIAGNOSIS — M25551 Pain in right hip: Secondary | ICD-10-CM | POA: Diagnosis not present

## 2018-04-18 DIAGNOSIS — M25551 Pain in right hip: Secondary | ICD-10-CM | POA: Diagnosis not present

## 2018-04-26 DIAGNOSIS — M25551 Pain in right hip: Secondary | ICD-10-CM | POA: Diagnosis not present

## 2018-05-08 ENCOUNTER — Telehealth: Payer: Self-pay

## 2018-05-08 NOTE — Telephone Encounter (Signed)
She ask for her "titers" she had done at [facility] in [place], ask her to call their office and make request

## 2018-05-08 NOTE — Telephone Encounter (Signed)
Needs to speak with a nurse about immunizations. Please call back.

## 2018-07-18 ENCOUNTER — Encounter: Payer: Self-pay | Admitting: Internal Medicine

## 2018-08-18 ENCOUNTER — Encounter: Payer: Self-pay | Admitting: Internal Medicine

## 2018-08-18 ENCOUNTER — Other Ambulatory Visit: Payer: Self-pay

## 2018-08-18 ENCOUNTER — Ambulatory Visit (INDEPENDENT_AMBULATORY_CARE_PROVIDER_SITE_OTHER): Payer: Self-pay | Admitting: Internal Medicine

## 2018-08-18 VITALS — BP 148/74 | HR 90 | Temp 98.4°F | Ht 61.0 in | Wt 182.7 lb

## 2018-08-18 DIAGNOSIS — I10 Essential (primary) hypertension: Secondary | ICD-10-CM

## 2018-08-18 DIAGNOSIS — J4521 Mild intermittent asthma with (acute) exacerbation: Secondary | ICD-10-CM

## 2018-08-18 DIAGNOSIS — D509 Iron deficiency anemia, unspecified: Secondary | ICD-10-CM

## 2018-08-18 MED ORDER — ALBUTEROL SULFATE HFA 108 (90 BASE) MCG/ACT IN AERS: 2.0000 | INHALATION_SPRAY | Freq: Four times a day (QID) | RESPIRATORY_TRACT | 2 refills | Status: DC | PRN

## 2018-08-18 MED ORDER — BUDESONIDE-FORMOTEROL FUMARATE 80-4.5 MCG/ACT IN AERO: INHALATION_SPRAY | RESPIRATORY_TRACT | 11 refills | Status: DC

## 2018-08-18 MED ORDER — ALBUTEROL SULFATE (2.5 MG/3ML) 0.083% IN NEBU
2.5000 mg | INHALATION_SOLUTION | Freq: Once | RESPIRATORY_TRACT | Status: AC
  Administered 2018-08-18: 2.5 mg via RESPIRATORY_TRACT

## 2018-08-18 NOTE — Progress Notes (Signed)
Internal Medicine Clinic Attending  Case discussed with Dr. Guilloud at the time of the visit.  We reviewed the resident's history and exam and pertinent patient test results.  I agree with the assessment, diagnosis, and plan of care documented in the resident's note.  

## 2018-08-18 NOTE — Assessment & Plan Note (Addendum)
Patient has iron deficiency anemia prescribed iron supplementation.  She reports inconsistent compliance with her iron tablets.  We will recheck CBC and ferritin today.  ADDENDUM: Hemoglobin improved 8.5 -> 11.1 and ferritin 15 -> 33. Called patient, left VM with results. Overall improving, instruction patient to continue iron supplementation.

## 2018-08-18 NOTE — Assessment & Plan Note (Signed)
Patient is here for follow-up of her asthma.  She ran out of her Symbicort and albuterol inhalers 1 month ago.  She has been symptomatic with wheezing and chest tightness.  On exam today she has bilateral expiratory wheezing, oxygenating 100% on room air.  --Albuterol neb x1 in clinic -Refilled Symbicort 80-4.5, 2 puffs twice daily -Refilled albuterol as needed

## 2018-08-18 NOTE — Patient Instructions (Addendum)
[name],  It was a pleasure to meet you. I have sent refills of your inhalers to your pharmacy. I will call you with the results of your blood work. Please keep your scheduled appointment with Dr. Reesa Chew in [name]. If you have any questions or concerns, call our clinic at [phone] or after hours call [phone] and ask for the internal medicine resident on call. Thank you!  Dr. Philipp Ovens

## 2018-08-18 NOTE — Progress Notes (Signed)
   CC: Asthma follow up  HPI:  [name] is a 53 y.o. female with past medical history outlined below here for asthma follow up. For the details of today's visit, please refer to the assessment and plan.  Past Medical History:  Diagnosis Date  . Anemia   . Asthma   . Bursitis of left hip   . Decreased appetite   . GERD (gastroesophageal reflux disease)   . History of hiatal hernia    repaired  . History of stomach ulcers   . Hypertension   . Migraine    "maybe twice in the last 10 yrs" (10/16/2013)  . PONV (postoperative nausea and vomiting)   . Shortness of breath dyspnea     Review of Systems  Respiratory: Positive for shortness of breath and wheezing. Negative for cough.     Physical Exam:  Vitals:   08/18/18 1049  BP: (!) 148/74  Pulse: 90  Temp: 98.4 F (36.9 C)  TempSrc: Oral  SpO2: 100%  Weight: 182 lb 11.2 oz (82.9 kg)  Height: 5\' 1"  (1.549 m)    Constitutional: NAD, appears comfortable Cardiovascular: RRR, no m/r/g Pulmonary/Chest: Mild bilateral expiratory wheezing Extremities: Warm and well perfused. No edema.  Psychiatric: Normal mood and affect  Assessment & Plan:   See Encounters Tab for problem based charting.  Patient discussed with Dr. Angelia Mould

## 2018-08-19 LAB — CBC
HEMATOCRIT: 34.1 % (ref 34.0–46.6)
HEMOGLOBIN: 11.1 g/dL (ref 11.1–15.9)
MCH: 27.8 pg (ref 26.6–33.0)
MCHC: 32.6 g/dL (ref 31.5–35.7)
MCV: 86 fL (ref 79–97)
Platelets: 282 10*3/uL (ref 150–450)
RBC: 3.99 x10E6/uL (ref 3.77–5.28)
RDW: 13.7 % (ref 11.7–15.4)
WBC: 7.4 10*3/uL (ref 3.4–10.8)

## 2018-08-19 LAB — BMP8+ANION GAP
ANION GAP: 16 mmol/L (ref 10.0–18.0)
BUN / CREAT RATIO: 15 (ref 9–23)
BUN: 11 mg/dL (ref 6–24)
CALCIUM: 9.7 mg/dL (ref 8.7–10.2)
CHLORIDE: 100 mmol/L (ref 96–106)
CO2: 22 mmol/L (ref 20–29)
CREATININE: 0.73 mg/dL (ref 0.57–1.00)
GFR calc non Af Amer: 96 mL/min/{1.73_m2} (ref >59)
GFR, EST AFRICAN AMERICAN: 111 mL/min/{1.73_m2} (ref >59)
Glucose: 100 mg/dL — ABNORMAL HIGH (ref 65–99)
Potassium: 3.6 mmol/L (ref 3.5–5.2)
Sodium: 138 mmol/L (ref 134–144)

## 2018-08-19 LAB — FERRITIN: Ferritin: 33 ng/mL (ref 15–150)

## 2018-08-21 ENCOUNTER — Telehealth: Payer: Self-pay

## 2018-08-21 DIAGNOSIS — J4521 Mild intermittent asthma with (acute) exacerbation: Secondary | ICD-10-CM

## 2018-08-21 MED ORDER — ALBUTEROL SULFATE HFA 108 (90 BASE) MCG/ACT IN AERS: 2.0000 | INHALATION_SPRAY | Freq: Four times a day (QID) | RESPIRATORY_TRACT | 2 refills | Status: DC | PRN

## 2018-08-21 MED ORDER — BUDESONIDE-FORMOTEROL FUMARATE 80-4.5 MCG/ACT IN AERO: INHALATION_SPRAY | RESPIRATORY_TRACT | 11 refills | Status: DC

## 2018-08-21 NOTE — Progress Notes (Signed)
Sent copay card to pharmacy for Symbicort, and requested least expensive albuterol option.

## 2018-08-21 NOTE — Telephone Encounter (Signed)
Pt calls and states she cannot afford her inhalers, is there anything she can do? Sending to dr Maudie Mercury for advice [phone]

## 2018-08-21 NOTE — Telephone Encounter (Signed)
Needs to speak with a nurse about med. Please call back.  

## 2018-10-16 ENCOUNTER — Encounter: Payer: No Typology Code available for payment source | Admitting: Internal Medicine

## 2018-11-04 ENCOUNTER — Other Ambulatory Visit: Payer: Self-pay | Admitting: Internal Medicine

## 2018-11-20 ENCOUNTER — Ambulatory Visit (INDEPENDENT_AMBULATORY_CARE_PROVIDER_SITE_OTHER): Payer: Self-pay | Admitting: Internal Medicine

## 2018-11-20 ENCOUNTER — Encounter: Payer: Self-pay | Admitting: Internal Medicine

## 2018-11-20 ENCOUNTER — Other Ambulatory Visit: Payer: Self-pay

## 2018-11-20 VITALS — BP 131/63 | HR 99 | Temp 97.7°F | Wt 180.6 lb

## 2018-11-20 DIAGNOSIS — Z111 Encounter for screening for respiratory tuberculosis: Secondary | ICD-10-CM | POA: Insufficient documentation

## 2018-11-20 DIAGNOSIS — Z79899 Other long term (current) drug therapy: Secondary | ICD-10-CM

## 2018-11-20 DIAGNOSIS — R7303 Prediabetes: Secondary | ICD-10-CM

## 2018-11-20 DIAGNOSIS — I10 Essential (primary) hypertension: Secondary | ICD-10-CM

## 2018-11-20 DIAGNOSIS — J4521 Mild intermittent asthma with (acute) exacerbation: Secondary | ICD-10-CM

## 2018-11-20 DIAGNOSIS — Z0000 Encounter for general adult medical examination without abnormal findings: Secondary | ICD-10-CM

## 2018-11-20 DIAGNOSIS — Z7951 Long term (current) use of inhaled steroids: Secondary | ICD-10-CM

## 2018-11-20 DIAGNOSIS — J4520 Mild intermittent asthma, uncomplicated: Secondary | ICD-10-CM

## 2018-11-20 DIAGNOSIS — R202 Paresthesia of skin: Secondary | ICD-10-CM

## 2018-11-20 DIAGNOSIS — E559 Vitamin D deficiency, unspecified: Secondary | ICD-10-CM

## 2018-11-20 DIAGNOSIS — R200 Anesthesia of skin: Secondary | ICD-10-CM | POA: Insufficient documentation

## 2018-11-20 LAB — POCT GLYCOSYLATED HEMOGLOBIN (HGB A1C): Hemoglobin A1C: 5.8 % — AB (ref 4.0–5.6)

## 2018-11-20 LAB — GLUCOSE, CAPILLARY: Glucose-Capillary: 147 mg/dL — ABNORMAL HIGH (ref 70–99)

## 2018-11-20 NOTE — Assessment & Plan Note (Signed)
BP Readings from Last 3 Encounters:  11/20/18 131/63  08/18/18 (!) 148/74  02/20/18 139/72   Blood pressure is controlled with amlodipine.  Denies any dizziness.  -Continue current management.

## 2018-11-20 NOTE — Assessment & Plan Note (Signed)
Denies any acute exacerbation at this time. Per patient she was compliant with her Symbicort but when she gets to get her refill they were asking her dollar 180 which she cannot afford.  Initially she was using some coupons.  -We will asked Dr. Maudie Mercury to help get her Symbicort. -Continue Symbicort and albuterol as directed.

## 2018-11-20 NOTE — Assessment & Plan Note (Signed)
A1c done today was 5.8. She remains in prediabetic state.  -Discuss lifestyle modifications including low carbohydrate diet and exercising regularly to avoid becoming diabetic.

## 2018-11-20 NOTE — Progress Notes (Signed)
   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  HPI:  [name] is a 53 y.o. 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  Patient was provided with skin TB testing today-we will come back in 2 days for further read.  She was complaining of intermittently losing right hand grip and some occasional right hand cramps.  Occasionally gets some tingling and numbness but denies any pain.  She has no other complaint.  Please see assessment and plan for her chronic conditions.  Past Medical History:  Diagnosis Date  . Anemia   . Asthma   . Bursitis of left hip   . Decreased appetite   . GERD (gastroesophageal reflux disease)   . History of hiatal hernia    repaired  . History of stomach ulcers   . Hypertension   . Migraine    "maybe twice in the last 10 yrs" (10/16/2013)  . PONV (postoperative nausea and vomiting)   . Shortness of breath dyspnea    Review of Systems: Negative except mentioned in HPI.  Physical Exam:  Vitals:   11/20/18 1351  BP: 131/63  Pulse: 99  Temp: 97.7 F (36.5 C)  TempSrc: Axillary  SpO2: 99%  Weight: 180 lb 9.6 oz (81.9 kg)   General: Vital signs reviewed.  Patient is well-developed and well-nourished, in no acute distress and cooperative with exam.  Head: Normocephalic and atraumatic. Eyes: EOMI, conjunctivae normal, no scleral icterus.  Neck: Supple, trachea midline, normal ROM, no JVD, masses, thyromegaly, or carotid bruit present.  Cardiovascular: RRR, S1 normal, S2 normal, no murmurs, gallops, or rubs. Pulmonary/Chest: Clear to auscultation bilaterally, no wheezes, rales, or rhonchi. Abdominal: Soft, non-tender, non-distended, BS +, no masses, organomegaly, or guarding present.  Musculoskeletal: No joint deformities, erythema, or stiffness, ROM full and nontender. Extremities: No lower extremity  edema bilaterally,  pulses symmetric and intact bilaterally. No cyanosis or clubbing. Neurological: A&O x3, Strength is normal and symmetric bilaterally, cranial nerve II-XII are grossly intact, no focal motor deficit, sensory intact to light touch bilaterally.  Skin: Warm, dry and intact. No rashes or erythema. Psychiatric: Normal mood and affect. speech and behavior is normal. Cognition and memory are normal.  Assessment & Plan:   See Encounters Tab for problem based charting.  Patient discussed with Dr. Angelia Mould.

## 2018-11-20 NOTE — Patient Instructions (Signed)
Thank you for visiting clinic today. We will be checking some labs which include vitamin B12 and vitamin D levels today due to your complaint of losing grip of right hand.  We will call you with any abnormal results.  If those levels are normal we might send you for nerve conduction studies. We are also testing you for TB as requested. I also placed an order for mammogram and colonoscopy which are due at this time. Continue taking your blood pressure medication as directed. Your A1c is in prediabetes range-follow your diet and exercise regularly so you can avoid becoming diabetic. Please follow-up in 57-month.

## 2018-11-20 NOTE — Assessment & Plan Note (Addendum)
She had very nonspecific complaints with a normal neurologic exam.  -We will check vitamin B12 and vitamin D levels today. -If normal she might need nerve conduction studies.  Addendum.  B12 levels are within normal range, vitamin D levels are low.  Sent a new prescription of vitamin D 50,000 units/week for 8 weeks.

## 2018-11-20 NOTE — Assessment & Plan Note (Signed)
We ordered mammography and colonoscopy today.

## 2018-11-21 ENCOUNTER — Telehealth: Payer: Self-pay | Admitting: Internal Medicine

## 2018-11-21 ENCOUNTER — Other Ambulatory Visit: Payer: Self-pay | Admitting: Internal Medicine

## 2018-11-21 DIAGNOSIS — J4521 Mild intermittent asthma with (acute) exacerbation: Secondary | ICD-10-CM

## 2018-11-21 LAB — VITAMIN D 25 HYDROXY (VIT D DEFICIENCY, FRACTURES): Vit D, 25-Hydroxy: 11.5 ng/mL — ABNORMAL LOW (ref 30.0–100.0)

## 2018-11-21 LAB — VITAMIN B12: Vitamin B-12: 384 pg/mL (ref 232–1245)

## 2018-11-21 MED ORDER — VITAMIN D (ERGOCALCIFEROL) 1.25 MG (50000 UNIT) PO CAPS: 50000.0000 [IU] | ORAL_CAPSULE | ORAL | 0 refills | Status: DC

## 2018-11-21 NOTE — Addendum Note (Signed)
Addended by: Lorella Nimrod on: 11/21/2018 09:10 AM   Modules accepted: Orders

## 2018-11-21 NOTE — Progress Notes (Signed)
Internal Medicine Clinic Attending  Case discussed with Dr. Amin at the time of the visit.  We reviewed the resident's history and exam and pertinent patient test results.  I agree with the assessment, diagnosis, and plan of care documented in the resident's note.    

## 2018-11-21 NOTE — Telephone Encounter (Signed)
Pt is returning call from physician regarding results, pt is currently at work, would like to the physician to call her after 1pm [phone]

## 2018-11-22 LAB — TB SKIN TEST
Induration: <0 mm
TB Skin Test: NEGATIVE

## 2018-11-30 ENCOUNTER — Telehealth: Payer: Self-pay | Admitting: *Deleted

## 2018-11-30 DIAGNOSIS — Z111 Encounter for screening for respiratory tuberculosis: Secondary | ICD-10-CM

## 2018-11-30 NOTE — Telephone Encounter (Signed)
Jolee Ewing and Darlina Guys at [facility] [employer] notified via [employer] that order has been placed in [employer] for Nebulizer/compressor. Hubbard Hartshorn, RN, BSN

## 2018-11-30 NOTE — Telephone Encounter (Signed)
Following messge received from Jolee Ewing at [facility]:  Order needs to have LON on it please. Let me know when revised order is entered.

## 2018-12-01 ENCOUNTER — Other Ambulatory Visit: Payer: Self-pay

## 2018-12-01 ENCOUNTER — Other Ambulatory Visit: Payer: Self-pay | Admitting: Internal Medicine

## 2018-12-01 ENCOUNTER — Ambulatory Visit (INDEPENDENT_AMBULATORY_CARE_PROVIDER_SITE_OTHER): Payer: BLUE CROSS/BLUE SHIELD | Admitting: *Deleted

## 2018-12-01 DIAGNOSIS — J4521 Mild intermittent asthma with (acute) exacerbation: Secondary | ICD-10-CM

## 2018-12-01 DIAGNOSIS — Z111 Encounter for screening for respiratory tuberculosis: Secondary | ICD-10-CM

## 2018-12-01 NOTE — Telephone Encounter (Signed)
I did paced another order.

## 2018-12-04 LAB — TB SKIN TEST
Induration: <0 mm
TB Skin Test: NEGATIVE

## 2018-12-06 NOTE — Telephone Encounter (Signed)
Community message sent to [facility] that new order for nebulizer with LON was placed on 12/01/2018. Hubbard Hartshorn, RN, BSN

## 2018-12-06 NOTE — Telephone Encounter (Signed)
Received community message from [facility] that she has received the order. Hubbard Hartshorn, RN, BSN

## 2018-12-08 DIAGNOSIS — J4540 Moderate persistent asthma, uncomplicated: Secondary | ICD-10-CM | POA: Diagnosis not present

## 2019-01-01 ENCOUNTER — Encounter: Payer: Self-pay | Admitting: *Deleted

## 2019-01-08 ENCOUNTER — Telehealth: Payer: Self-pay

## 2019-01-08 NOTE — Telephone Encounter (Signed)
Pt would like a referral for nerve conduction on the hand. Please call pt back.

## 2019-01-09 NOTE — Telephone Encounter (Signed)
Pt is calling about a nerve referral, pls contact

## 2019-01-11 NOTE — Telephone Encounter (Signed)
Pt called back angry and rude about her referral for nerve conduction, it was talked about with her in may when she was prescribed VITD 8 tabs and the start otc vit d, also she stated that [facility] but at this time per what pt verbally stated to nurse she should be taking otc vit d. She c/o being given a doctor that she does not know, she was ask if she knew a resident that she would like to be assigned to and she answered no. She c/o because she was given an appt in [facility] and she doesn't know the doctors. She c/o because her newly assigned pcp does not have any appointments available. She complained because she wasn't given anything to help with her hand/arm pain even though she stated she had not called clinic back and relayed that fact.  Appointment is of her choosing, she states she is going out of town and want be back til Tuesday of next week, appt [facility] 7/15 at [place] She was ask to use [address] entrance/ main entrance, wear mask, sanitize hands often

## 2019-01-11 NOTE — Telephone Encounter (Signed)
Thank you. We will evaluate her then

## 2019-01-11 NOTE — Telephone Encounter (Signed)
Pt is calling back, pls contact pt [phone]

## 2019-01-17 ENCOUNTER — Ambulatory Visit (INDEPENDENT_AMBULATORY_CARE_PROVIDER_SITE_OTHER): Payer: BC Managed Care – PPO | Admitting: Internal Medicine

## 2019-01-17 ENCOUNTER — Encounter: Payer: Self-pay | Admitting: Internal Medicine

## 2019-01-17 ENCOUNTER — Other Ambulatory Visit: Payer: Self-pay

## 2019-01-17 VITALS — BP 145/65 | HR 88 | Temp 98.3°F | Ht 61.0 in | Wt 184.0 lb

## 2019-01-17 DIAGNOSIS — M5412 Radiculopathy, cervical region: Secondary | ICD-10-CM

## 2019-01-17 DIAGNOSIS — M5010 Cervical disc disorder with radiculopathy, unspecified cervical region: Secondary | ICD-10-CM | POA: Diagnosis not present

## 2019-01-17 DIAGNOSIS — M5410 Radiculopathy, site unspecified: Secondary | ICD-10-CM | POA: Insufficient documentation

## 2019-01-17 NOTE — Patient Instructions (Signed)
I have ordered an MRI of your neck for evaluation of your right arm issues.  Also, please continue to take your vitamin D which can help with bone health along with several other preventative things. It was a pleasure meeting you today!
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
## 2019-01-18 NOTE — Progress Notes (Signed)
Internal Medicine Clinic Attending  I saw and evaluated the patient.  I personally confirmed the key portions of the history and exam documented by Dr. Christian   and I reviewed pertinent patient test results.  The assessment, diagnosis, and plan were formulated together and I agree with the documentation in the resident's note.  

## 2019-01-31 DIAGNOSIS — Z01818 Encounter for other preprocedural examination: Secondary | ICD-10-CM | POA: Diagnosis not present

## 2019-02-02 ENCOUNTER — Emergency Department (HOSPITAL_COMMUNITY): Payer: BC Managed Care – PPO

## 2019-02-02 ENCOUNTER — Emergency Department (HOSPITAL_COMMUNITY)
Admission: EM | Admit: 2019-02-02 | Discharge: 2019-02-03 | Disposition: A | Discharge status: Other Acute Inpatient Hospital | Payer: BC Managed Care – PPO | Source: Home / Self Care | Attending: Emergency Medicine | Admitting: Emergency Medicine

## 2019-02-02 ENCOUNTER — Other Ambulatory Visit: Payer: Self-pay

## 2019-02-02 ENCOUNTER — Encounter (HOSPITAL_COMMUNITY): Payer: Self-pay | Admitting: Emergency Medicine

## 2019-02-02 ENCOUNTER — Ambulatory Visit (HOSPITAL_COMMUNITY): Payer: BC Managed Care – PPO

## 2019-02-02 DIAGNOSIS — Z96641 Presence of right artificial hip joint: Secondary | ICD-10-CM | POA: Insufficient documentation

## 2019-02-02 DIAGNOSIS — R064 Hyperventilation: Secondary | ICD-10-CM | POA: Diagnosis not present

## 2019-02-02 DIAGNOSIS — Z79899 Other long term (current) drug therapy: Secondary | ICD-10-CM | POA: Insufficient documentation

## 2019-02-02 DIAGNOSIS — F410 Panic disorder [episodic paroxysmal anxiety] without agoraphobia: Secondary | ICD-10-CM

## 2019-02-02 DIAGNOSIS — I10 Essential (primary) hypertension: Secondary | ICD-10-CM | POA: Insufficient documentation

## 2019-02-02 DIAGNOSIS — Z008 Encounter for other general examination: Secondary | ICD-10-CM | POA: Insufficient documentation

## 2019-02-02 DIAGNOSIS — F322 Major depressive disorder, single episode, severe without psychotic features: Secondary | ICD-10-CM | POA: Insufficient documentation

## 2019-02-02 DIAGNOSIS — R0789 Other chest pain: Secondary | ICD-10-CM | POA: Diagnosis not present

## 2019-02-02 DIAGNOSIS — Z7982 Long term (current) use of aspirin: Secondary | ICD-10-CM | POA: Insufficient documentation

## 2019-02-02 DIAGNOSIS — R0902 Hypoxemia: Secondary | ICD-10-CM | POA: Diagnosis not present

## 2019-02-02 DIAGNOSIS — F4322 Adjustment disorder with anxiety: Secondary | ICD-10-CM | POA: Insufficient documentation

## 2019-02-02 DIAGNOSIS — R45850 Homicidal ideations: Secondary | ICD-10-CM | POA: Insufficient documentation

## 2019-02-02 DIAGNOSIS — R0689 Other abnormalities of breathing: Secondary | ICD-10-CM | POA: Diagnosis not present

## 2019-02-02 DIAGNOSIS — F439 Reaction to severe stress, unspecified: Secondary | ICD-10-CM | POA: Insufficient documentation

## 2019-02-02 DIAGNOSIS — R079 Chest pain, unspecified: Secondary | ICD-10-CM | POA: Diagnosis not present

## 2019-02-02 DIAGNOSIS — J45909 Unspecified asthma, uncomplicated: Secondary | ICD-10-CM | POA: Insufficient documentation

## 2019-02-02 DIAGNOSIS — Z20828 Contact with and (suspected) exposure to other viral communicable diseases: Secondary | ICD-10-CM | POA: Insufficient documentation

## 2019-02-02 DIAGNOSIS — Z87891 Personal history of nicotine dependence: Secondary | ICD-10-CM | POA: Insufficient documentation

## 2019-02-02 LAB — COMPREHENSIVE METABOLIC PANEL
ALT: 15 U/L (ref 0–44)
AST: 16 U/L (ref 15–41)
Albumin: 3.9 g/dL (ref 3.5–5.0)
Alkaline Phosphatase: 52 U/L (ref 38–126)
Anion gap: 9 (ref 5–15)
BUN: 13 mg/dL (ref 6–20)
CO2: 25 mmol/L (ref 22–32)
Calcium: 9.5 mg/dL (ref 8.9–10.3)
Chloride: 104 mmol/L (ref 98–111)
Creatinine, Ser: 0.62 mg/dL (ref 0.44–1.00)
GFR calc Af Amer: >60 mL/min (ref >60)
GFR calc non Af Amer: >60 mL/min (ref >60)
Glucose, Bld: 143 mg/dL — ABNORMAL HIGH (ref 70–99)
Potassium: 3 mmol/L — ABNORMAL LOW (ref 3.5–5.1)
Sodium: 138 mmol/L (ref 135–145)
Total Bilirubin: 0.3 mg/dL (ref 0.3–1.2)
Total Protein: 7.3 g/dL (ref 6.5–8.1)

## 2019-02-02 LAB — CBC WITH DIFFERENTIAL/PLATELET
Abs Immature Granulocytes: 0.02 10*3/uL (ref 0.00–0.07)
Basophils Absolute: 0.1 10*3/uL (ref 0.0–0.1)
Basophils Relative: 1 %
Eosinophils Absolute: 0.1 10*3/uL (ref 0.0–0.5)
Eosinophils Relative: 1 %
HCT: 36.5 % (ref 36.0–46.0)
Hemoglobin: 11.7 g/dL — ABNORMAL LOW (ref 12.0–15.0)
Immature Granulocytes: 0 %
Lymphocytes Relative: 21 %
Lymphs Abs: 1.8 10*3/uL (ref 0.7–4.0)
MCH: 27.7 pg (ref 26.0–34.0)
MCHC: 32.1 g/dL (ref 30.0–36.0)
MCV: 86.3 fL (ref 80.0–100.0)
Monocytes Absolute: 0.5 10*3/uL (ref 0.1–1.0)
Monocytes Relative: 6 %
Neutro Abs: 6.2 10*3/uL (ref 1.7–7.7)
Neutrophils Relative %: 71 %
Platelets: 298 10*3/uL (ref 150–400)
RBC: 4.23 MIL/uL (ref 3.87–5.11)
RDW: 13.9 % (ref 11.5–15.5)
WBC: 8.7 10*3/uL (ref 4.0–10.5)
nRBC: 0 % (ref 0.0–0.2)

## 2019-02-02 LAB — TROPONIN I (HIGH SENSITIVITY)
Troponin I (High Sensitivity): 52 ng/L — ABNORMAL HIGH (ref <18)
Troponin I (High Sensitivity): 72 ng/L — ABNORMAL HIGH (ref <18)

## 2019-02-02 LAB — RAPID URINE DRUG SCREEN, HOSP PERFORMED
Amphetamines: NOT DETECTED
Barbiturates: NOT DETECTED
Benzodiazepines: NOT DETECTED
Cocaine: NOT DETECTED
Opiates: NOT DETECTED
Tetrahydrocannabinol: NOT DETECTED

## 2019-02-02 LAB — ETHANOL: Alcohol, Ethyl (B): <10 mg/dL (ref <10)

## 2019-02-02 MED ORDER — LORAZEPAM 1 MG PO TABS
1.0000 mg | ORAL_TABLET | Freq: Once | ORAL | Status: AC
  Administered 2019-02-02: 21:00:00 1 mg via ORAL
  Filled 2019-02-02: qty 1

## 2019-02-02 MED ORDER — POTASSIUM CHLORIDE CRYS ER 20 MEQ PO TBCR
40.0000 meq | EXTENDED_RELEASE_TABLET | Freq: Once | ORAL | Status: AC
  Administered 2019-02-02: 23:00:00 40 meq via ORAL
  Filled 2019-02-02: qty 2

## 2019-02-02 MED ORDER — HYDROCODONE-ACETAMINOPHEN 5-325 MG PO TABS
1.0000 | ORAL_TABLET | Freq: Once | ORAL | Status: AC
  Administered 2019-02-02: 23:00:00 1 via ORAL
  Filled 2019-02-02: qty 1
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
## 2019-02-03 ENCOUNTER — Encounter (HOSPITAL_COMMUNITY): Payer: Self-pay

## 2019-02-03 ENCOUNTER — Inpatient Hospital Stay (HOSPITAL_COMMUNITY)
Admission: AD | Admit: 2019-02-03 | Discharge: 2019-02-04 | DRG: 881 | Disposition: A | Discharge status: Home / Self Care | Payer: BC Managed Care – PPO | Source: Intra-hospital | Attending: Psychiatry | Admitting: Psychiatry

## 2019-02-03 DIAGNOSIS — K219 Gastro-esophageal reflux disease without esophagitis: Secondary | ICD-10-CM | POA: Diagnosis present

## 2019-02-03 DIAGNOSIS — R45851 Suicidal ideations: Secondary | ICD-10-CM | POA: Diagnosis present

## 2019-02-03 DIAGNOSIS — G4700 Insomnia, unspecified: Secondary | ICD-10-CM | POA: Diagnosis present

## 2019-02-03 DIAGNOSIS — Z96641 Presence of right artificial hip joint: Secondary | ICD-10-CM | POA: Diagnosis present

## 2019-02-03 DIAGNOSIS — Z8249 Family history of ischemic heart disease and other diseases of the circulatory system: Secondary | ICD-10-CM

## 2019-02-03 DIAGNOSIS — F329 Major depressive disorder, single episode, unspecified: Principal | ICD-10-CM | POA: Diagnosis present

## 2019-02-03 DIAGNOSIS — I10 Essential (primary) hypertension: Secondary | ICD-10-CM | POA: Diagnosis present

## 2019-02-03 DIAGNOSIS — Z79899 Other long term (current) drug therapy: Secondary | ICD-10-CM

## 2019-02-03 DIAGNOSIS — R079 Chest pain, unspecified: Secondary | ICD-10-CM | POA: Diagnosis not present

## 2019-02-03 DIAGNOSIS — Z888 Allergy status to other drugs, medicaments and biological substances status: Secondary | ICD-10-CM | POA: Diagnosis not present

## 2019-02-03 DIAGNOSIS — D649 Anemia, unspecified: Secondary | ICD-10-CM | POA: Diagnosis not present

## 2019-02-03 DIAGNOSIS — Z20828 Contact with and (suspected) exposure to other viral communicable diseases: Secondary | ICD-10-CM | POA: Diagnosis not present

## 2019-02-03 DIAGNOSIS — F4324 Adjustment disorder with disturbance of conduct: Secondary | ICD-10-CM | POA: Diagnosis not present

## 2019-02-03 DIAGNOSIS — R45850 Homicidal ideations: Secondary | ICD-10-CM | POA: Diagnosis not present

## 2019-02-03 DIAGNOSIS — F325 Major depressive disorder, single episode, in full remission: Secondary | ICD-10-CM | POA: Diagnosis present

## 2019-02-03 DIAGNOSIS — Z7982 Long term (current) use of aspirin: Secondary | ICD-10-CM

## 2019-02-03 DIAGNOSIS — F419 Anxiety disorder, unspecified: Secondary | ICD-10-CM | POA: Diagnosis not present

## 2019-02-03 DIAGNOSIS — Z87891 Personal history of nicotine dependence: Secondary | ICD-10-CM | POA: Diagnosis not present

## 2019-02-03 DIAGNOSIS — F410 Panic disorder [episodic paroxysmal anxiety] without agoraphobia: Secondary | ICD-10-CM | POA: Diagnosis not present

## 2019-02-03 DIAGNOSIS — F322 Major depressive disorder, single episode, severe without psychotic features: Secondary | ICD-10-CM | POA: Diagnosis present

## 2019-02-03 DIAGNOSIS — R0789 Other chest pain: Secondary | ICD-10-CM | POA: Diagnosis not present

## 2019-02-03 DIAGNOSIS — J45909 Unspecified asthma, uncomplicated: Secondary | ICD-10-CM | POA: Diagnosis not present

## 2019-02-03 LAB — SARS CORONAVIRUS 2 BY RT PCR (HOSPITAL ORDER, PERFORMED IN ~~LOC~~ HOSPITAL LAB): SARS Coronavirus 2: NEGATIVE

## 2019-02-03 MED ORDER — IBUPROFEN 200 MG PO TABS
600.0000 mg | ORAL_TABLET | Freq: Once | ORAL | Status: AC
  Administered 2019-02-03: 600 mg via ORAL
  Filled 2019-02-03: qty 3

## 2019-02-03 MED ORDER — ALBUTEROL SULFATE HFA 108 (90 BASE) MCG/ACT IN AERS: 1.0000 | INHALATION_SPRAY | Freq: Four times a day (QID) | RESPIRATORY_TRACT | Status: DC | PRN

## 2019-02-03 MED ORDER — ACETAMINOPHEN 500 MG PO TABS: 1000.0000 mg | ORAL_TABLET | Freq: Four times a day (QID) | ORAL | Status: DC | PRN

## 2019-02-03 MED ORDER — FAMOTIDINE 20 MG PO TABS
20.0000 mg | ORAL_TABLET | Freq: Two times a day (BID) | ORAL | Status: DC
  Administered 2019-02-03 – 2019-02-04 (×2): 20 mg via ORAL
  Filled 2019-02-03 (×7): qty 1

## 2019-02-03 MED ORDER — AMLODIPINE BESYLATE 10 MG PO TABS
10.0000 mg | ORAL_TABLET | Freq: Every day | ORAL | Status: DC
  Administered 2019-02-03 – 2019-02-04 (×2): 10 mg via ORAL
  Filled 2019-02-03 (×4): qty 1

## 2019-02-03 MED ORDER — AMLODIPINE BESYLATE 5 MG PO TABS: 10.0000 mg | ORAL_TABLET | Freq: Every day | ORAL | Status: DC

## 2019-02-03 MED ORDER — ALUM & MAG HYDROXIDE-SIMETH 200-200-20 MG/5ML PO SUSP: 30.0000 mL | ORAL | Status: DC | PRN

## 2019-02-03 MED ORDER — VITAMIN D 25 MCG (1000 UNIT) PO TABS: 1000.0000 [IU] | ORAL_TABLET | Freq: Every day | ORAL | Status: DC

## 2019-02-03 MED ORDER — ALBUTEROL SULFATE HFA 108 (90 BASE) MCG/ACT IN AERS: 2.0000 | INHALATION_SPRAY | Freq: Four times a day (QID) | RESPIRATORY_TRACT | Status: DC | PRN

## 2019-02-03 MED ORDER — ALBUTEROL SULFATE HFA 108 (90 BASE) MCG/ACT IN AERS
1.0000 | INHALATION_SPRAY | RESPIRATORY_TRACT | Status: DC
  Administered 2019-02-03 – 2019-02-04 (×5): 2 via RESPIRATORY_TRACT
  Filled 2019-02-03 (×2): qty 6.7

## 2019-02-03 MED ORDER — TRAZODONE HCL 50 MG PO TABS
50.0000 mg | ORAL_TABLET | Freq: Every evening | ORAL | Status: DC | PRN
  Filled 2019-02-03 (×6): qty 1

## 2019-02-03 MED ORDER — ASPIRIN 81 MG PO TBEC
81.0000 mg | DELAYED_RELEASE_TABLET | Freq: Two times a day (BID) | ORAL | Status: DC
  Administered 2019-02-03 – 2019-02-04 (×3): 81 mg via ORAL
  Filled 2019-02-03 (×8): qty 1

## 2019-02-03 MED ORDER — LORATADINE 10 MG PO TABS: 10.0000 mg | ORAL_TABLET | Freq: Every day | ORAL | Status: DC

## 2019-02-03 MED ORDER — ACETAMINOPHEN 325 MG PO TABS
650.0000 mg | ORAL_TABLET | Freq: Four times a day (QID) | ORAL | Status: DC | PRN
  Administered 2019-02-03: 650 mg via ORAL
  Filled 2019-02-03: qty 2

## 2019-02-03 MED ORDER — POTASSIUM CHLORIDE CRYS ER 20 MEQ PO TBCR
20.0000 meq | EXTENDED_RELEASE_TABLET | Freq: Once | ORAL | Status: AC
  Administered 2019-02-03: 20 meq via ORAL
  Filled 2019-02-03: qty 1

## 2019-02-03 MED ORDER — MAGNESIUM HYDROXIDE 400 MG/5ML PO SUSP: 30.0000 mL | Freq: Every day | ORAL | Status: DC | PRN

## 2019-02-03 MED ORDER — HYDROXYZINE HCL 25 MG PO TABS
25.0000 mg | ORAL_TABLET | Freq: Four times a day (QID) | ORAL | Status: DC | PRN
  Administered 2019-02-03: 22:00:00 25 mg via ORAL
  Filled 2019-02-03: qty 1

## 2019-02-03 MED ORDER — FLUTICASONE PROPIONATE HFA 44 MCG/ACT IN AERO
2.0000 | INHALATION_SPRAY | Freq: Two times a day (BID) | RESPIRATORY_TRACT | Status: DC
  Administered 2019-02-03 – 2019-02-04 (×3): 2 via RESPIRATORY_TRACT
  Filled 2019-02-03 (×2): qty 10.6

## 2019-02-03 NOTE — Progress Notes (Signed)
Patient signed 72 hr request for discharge today 02/03/19 at 1817. Patient educated on process.

## 2019-02-03 NOTE — Progress Notes (Signed)
   02/03/19 0855  COVID-19 Daily Checkoff  Have you had a fever (temp > 37.80C/100F)  in the past 24 hours?  No  If you have had runny nose, nasal congestion, sneezing in the past 24 hours, has it worsened? No  COVID-19 EXPOSURE  Have you traveled outside the state in the past 14 days? No  Have you been in contact with someone with a confirmed diagnosis of COVID-19 or PUI in the past 14 days without wearing appropriate PPE? No  Have you been living in the same home as a person with confirmed diagnosis of COVID-19 or a PUI (household contact)? No  Have you been diagnosed with COVID-19? No

## 2019-02-03 NOTE — Progress Notes (Signed)
[place] NOVEL CORONAVIRUS (COVID-19) DAILY CHECK-OFF SYMPTOMS - answer yes or no to each - every day NO YES  Have you had a fever in the past 24 hours?  . Fever (Temp > 37.80C / 100F) X   Have you had any of these symptoms in the past 24 hours? . New Cough .  Sore Throat  .  Shortness of Breath .  Difficulty Breathing .  Unexplained Body Aches   X   Have you had any one of these symptoms in the past 24 hours not related to allergies?   . Runny Nose .  Nasal Congestion .  Sneezing   X   If you have had runny nose, nasal congestion, sneezing in the past 24 hours, has it worsened?  X   EXPOSURES - check yes or no X   Have you traveled outside the state in the past 14 days?  X   Have you been in contact with someone with a confirmed diagnosis of COVID-19 or PUI in the past 14 days without wearing appropriate PPE?  X   Have you been living in the same home as a person with confirmed diagnosis of COVID-19 or a PUI (household contact)?    X   Have you been diagnosed with COVID-19?    X              What to do next: Answered NO to all: Answered YES to anything:   Proceed with unit schedule Follow the [facility] Inpatient Flowsheet.   
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
## 2019-02-03 NOTE — ED Notes (Signed)
[facility] contacted and en route

## 2019-02-03 NOTE — ED Notes (Signed)
Attempted to call report to [facility], [phone], no answer. Will try back shortly.

## 2019-02-03 NOTE — Progress Notes (Signed)
TTS consulted with Patriciaann Clan, PA who recommends inpt tx. TTS to seek placement. EDP Recardo Evangelist, PA-C and pt's nurse Salvatore Marvel, RN have been advised.   Lind Covert, MSW, LCSW Therapeutic Triage Specialist  [phone]
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
## 2019-02-03 NOTE — Progress Notes (Signed)
Pt accepted to [facility] pending negative Covid test. Attending provider will be Dr. Mallie Darting, MD. Call to report [phone]. Pt's nurse Salvatore Marvel, RN has been advised.   Lind Covert, MSW, LCSW Therapeutic Triage Specialist  [phone]

## 2019-02-03 NOTE — [facility] Suicide Risk Assessment (Signed)
[facility] Admission Suicide Risk Assessment   Nursing information obtained from:    Demographic factors:  Low socioeconomic status Current Mental Status:  Plan to harm others, Intention to act on plan to harm others Loss Factors:  Decline in physical health, Financial problems / change in socioeconomic status Historical Factors:  NA Risk Reduction Factors:  Positive social support  Total Time spent with patient: 30 minutes Principal Problem: <principal problem not specified> Diagnosis:  Active Problems:   MDD (major depressive disorder), severe (HCC)  Subjective Data: Patient is seen and examined.  Patient is a 53 year old female with a negative past psychiatric history who presented to the [facility] emergency department on 02/03/2019 with homicidal ideation.  The patient stated that her nephew moved from [place] to live at their home.  At that time the patient's nephew had gotten out of prison, and wanted to get out of the environment in [place].  She stated that since he had been there things in the home and gotten chaotic.  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  He would be on the telephone outside her window, and disturb her sleep.  On the night prior to admission the nephew had gotten into an argument with her brother, and words were said that were very inflammatory.  Later that day or the next she began to feel "a spirit inside of her that was a bad energy".  She called her grandmother and had her pray for her.  She stated that she went outside and attempted to calm down by praying, but was crying and upset.  The patient stated that her nephew is making threats to her that he had been in a gang previously, and that he would kill her brother or her.  She became so angry she began thinking of ways to kill him.  She stated "if I had a gun I would have used it".  Patient has a negative past psychiatric history and denied ever having been on  medications previously.  She stated that her sleep is disrupted because she works the night shift.  She denied any auditory or visual hallucinations.  She does have a past medical history of asthma as well as anemia.  She also has hypertension.  She currently denied suicidal ideation.  She was admitted to the hospital for evaluation and stabilization.  Continued Clinical Symptoms:  Alcohol Use Disorder Identification Test Final Score (AUDIT): 2 The "Alcohol Use Disorders Identification Test", Guidelines for Use in Primary Care, Second Edition.  World Pharmacologist [employer]). Score between 0-7:  no or low risk or alcohol related problems. Score between 8-15:  moderate risk of alcohol related problems. Score between 16-19:  high risk of alcohol related problems. Score 20 or above:  warrants further diagnostic evaluation for alcohol dependence and treatment.   CLINICAL FACTORS:   Severe Anxiety and/or Agitation   Musculoskeletal: Strength & Muscle Tone: within normal limits Gait & Station: normal Patient leans: N/A  Psychiatric Specialty Exam: Physical Exam  Nursing note and vitals reviewed. Constitutional: She is oriented to person, place, and time. She appears well-developed and well-nourished.  HENT:  Head: Normocephalic and atraumatic.  Respiratory: Effort normal.  Neurological: She is alert and oriented to person, place, and time.    ROS  Blood pressure (!) 119/95, pulse 99, temperature 97.8 F (36.6 C), temperature source Oral, resp. rate 20, height 5\' 1"  (1.549 m), weight 80.7 kg, SpO2 100 %.Body mass index  is 33.63 kg/m.  General Appearance: Casual  Eye Contact:  Good  Speech:  Normal Rate  Volume:  Normal  Mood:  Anxious  Affect:  Congruent  Thought Process:  Coherent and Descriptions of Associations: Intact  Orientation:  Full (Time, Place, and Person)  Thought Content:  Logical  Suicidal Thoughts:  No  Homicidal Thoughts:  No  Memory:  Immediate;   Fair Recent;    Fair Remote;   Fair  Judgement:  Intact  Insight:  Present  Psychomotor Activity:  Increased  Concentration:  Concentration: Fair and Attention Span: Fair  Recall:  [employer] of Knowledge:  Fair  Language:  Fair  Akathisia:  Negative  Handed:  Right  AIMS (if indicated):     Assets:  Desire for Improvement Resilience  ADL's:  Intact  Cognition:  WNL  Sleep:         COGNITIVE FEATURES THAT CONTRIBUTE TO RISK:  None    SUICIDE RISK:   Minimal: No identifiable suicidal ideation.  Patients presenting with no risk factors but with morbid ruminations; may be classified as minimal risk based on the severity of the depressive symptoms  PLAN OF CARE: Patient is seen and examined.  Patient is a 53 year old female with the above stated past psychiatric history who presented to the [facility] emergency department with homicidal ideation.  She has a past psychiatric history that is negative, she denied any substances or alcohol.  Her blood alcohol was less than 10 and her drug screen was negative.  She has no previous psychiatric admissions.  She currently denies suicidal or homicidal ideation.  She will be admitted to the hospital.  She will be integrated into the milieu.  She will be encouraged to attend groups.  We will contact her brother and get collateral information.  She plans on moving out, and so we will try and confirm all that information.  Right now we will just use the trazodone and hydroxyzine on the standing orders.  I do not think she needs any psychiatric medicines beyond that at least at this point.  Review of her laboratories showed a mildly low potassium, and elevated blood sugar at 143, and a troponin that was elevated at 72.  Her EKG showed a left bundle branch block.  Her work-up in the emergency department made the physician believe that this was not a cardiac situation.  Her blood pressure this morning is 119/95.  She stated she takes 10 mg of amlodipine, and that  will be restarted.  Her potassium will be supplemented.  I certify that inpatient services furnished can reasonably be expected to improve the patient's condition.   Sharma Covert, MD 02/03/2019, 8:56 AM

## 2019-02-03 NOTE — ED Notes (Signed)
Pt on consult with TTS

## 2019-02-03 NOTE — [facility] Group Notes (Signed)
[facility] Notes:  (Nursing)  Date:  02/03/2019  Time:  115 PM Type of Therapy:  Nurse Education  Participation Level:  Active  Participation Quality:  Appropriate  Affect:  Appropriate  Cognitive:  Appropriate  Insight:  Appropriate  Engagement in Group:  Engaged  Modes of Intervention:  Education  Summary of Progress/Problems: Life Skills Group  [name] 02/03/2019, 2:45 PM

## 2019-02-03 NOTE — H&P (Signed)
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  Patient is a 53 year old female with a negative past psychiatric history who presented to the [facility] emergency department on 02/03/2019 with homicidal ideation.  The patient stated that her nephew moved from [place] to live at their home.  At that time the patient's nephew had gotten out of prison, and wanted to get out of the environment in [place].  She stated that since he had been there things in the home and gotten chaotic.  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  He would be on the telephone outside her window, and disturb her sleep.  On the night prior to admission the nephew had gotten into an argument with her brother, and words were said that were very inflammatory.  Later that day or the next she began to feel "a spirit inside of her that was a bad energy".  She called her grandmother and had her pray for her.  She stated that she went outside and attempted to calm down by praying, but was crying and upset.  The patient stated that her nephew is making threats to her that he had been in a gang previously, and that he would kill her brother or her.  She became so angry she began thinking of ways to kill him.  She stated "if I had a gun I would have used it".  Patient has a negative past psychiatric history and denied ever having been on medications previously.  She stated that her sleep is disrupted because she works the night shift.  She denied any auditory or visual hallucinations.  She does have a past medical history of asthma as well as anemia.  She also  has hypertension.  She currently denied suicidal ideation.  She was admitted to the hospital for evaluation and stabilization.  Associated Signs/Symptoms: Depression Symptoms:  anhedonia, insomnia, psychomotor agitation, fatigue, feelings of worthlessness/guilt, difficulty concentrating, hopelessness, suicidal thoughts without plan, anxiety, loss of energy/fatigue, disturbed sleep, weight loss, (Hypo) Manic Symptoms:  Impulsivity, Irritable Mood, Labiality of Mood, Anxiety Symptoms:  Excessive Worry, Psychotic Symptoms:  Hallucinations: Somatic PTSD Symptoms: Negative Total Time spent with patient: 30 minutes  Past Psychiatric History: She denied any previous psychiatric admissions, any previous psychiatric evaluations or any previous psychiatric medications  Is the patient at risk to self? No.  Has the patient been a risk to self in the past 6 months? No.  Has the patient been a risk to self within the distant past? No.  Is the patient a risk to others? Yes.    Has the patient been a risk to others in the past 6 months? No.  Has the patient been a risk to others within the distant past? No.   Prior Inpatient Therapy:   Prior Outpatient Therapy:    Alcohol Screening: 1. How often do you have a drink containing alcohol?: Monthly or less 2. How many drinks containing alcohol do you have on a typical day when you are drinking?: 1 or 2 3. How often do you have six or more drinks on one occasion?: Never AUDIT-C Score: 1 4. How often during the last year have you found  that you were not able to stop drinking once you had started?: Less than monthly 5. How often during the last year have you failed to do what was normally expected from you becasue of drinking?: Never 6. How often during the last year have you needed a first drink in the morning to get yourself going after a heavy drinking session?: Never 7. How often during the last year have you had a feeling of guilt of remorse  after drinking?: Never 8. How often during the last year have you been unable to remember what happened the night before because you had been drinking?: Never 9. Have you or someone else been injured as a result of your drinking?: No 10. Has a relative or friend or a doctor or another health worker been concerned about your drinking or suggested you cut down?: No Alcohol Use Disorder Identification Test Final Score (AUDIT): 2 Substance Abuse History in the last 12 months:  No. Consequences of Substance Abuse: Negative Previous Psychotropic Medications: No  Psychological Evaluations: No  Past Medical History:  Past Medical History:  Diagnosis Date  . Anemia   . Asthma   . Bursitis of left hip   . Decreased appetite   . GERD (gastroesophageal reflux disease)   . History of hiatal hernia    repaired  . History of stomach ulcers   . Hypertension   . Migraine    "maybe twice in the last 10 yrs" (10/16/2013)  . PONV (postoperative nausea and vomiting)   . Shortness of breath dyspnea     Past Surgical History:  Procedure Laterality Date  . BARTHOLIN GLAND CYST EXCISION    . BREAST LUMPECTOMY WITH RADIOACTIVE SEED LOCALIZATION Left 01/23/2016   Procedure: BREAST LUMPECTOMY WITH RADIOACTIVE SEED LOCALIZATION;  Surgeon: Jackolyn Confer, MD;  Location: [facility];  Service: General;  Laterality: Left;  . CESAREAN SECTION  1989  . CHOLECYSTECTOMY  ~ 2000  . EPIGASTRIC HERNIA REPAIR  07/2003  . HERNIA REPAIR    . INCISION AND DRAINAGE ABSCESS  10/2005   Bartholin abscess.  . TONSILLECTOMY AND ADENOIDECTOMY  1983  . 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 TOTAL HIP ARTHROPLASTY Right 06/06/2017   Procedure: RIGHT TOTAL HIP ARTHROPLASTY ANTERIOR APPROACH;  Surgeon: Rod Can, MD;  Location: [facility];  Service: Orthopedics;  Laterality: Right;  Needs RNFA  . TUBAL LIGATION  1994   Family History:  Family History  Problem Relation Age of Onset  . Cancer Mother 13       unknown  . Heart  attack Father 12  . Heart attack Sister 80  . Diabetes Sister    Family Psychiatric  History: Noncontributory Tobacco Screening: Have you used any form of tobacco in the last 30 days? (Cigarettes, Smokeless Tobacco, Cigars, and/or Pipes): No Social History:  Social History   Substance and Sexual Activity  Alcohol Use Yes  . Alcohol/week: 0.0 standard drinks   Comment: socially     Social History   Substance and Sexual Activity  Drug Use No    Additional Social History:                           Allergies:   Allergies  Allergen Reactions  . Amoxicillin Rash    PATIENT HAS HAD A PCN REACTION WITH IMMEDIATE RASH, FACIAL/TONGUE/THROAT SWELLING, SOB, OR LIGHTHEADEDNESS WITH HYPOTENSION:  #  #  #  YES  #  #  #   HAS  PT DEVELOPED SEVERE RASH INVOLVING MUCUS MEMBRANES or SKIN NECROSIS: #  #  #  YES  #  #  #     "On my face" Has patient had a PCN reaction that required hospitalization: No Has patient had a PCN reaction occurring within the last 10 years:  #  #  #  UNKNOWN  #  #  #  .   Marland Kitchen Ampicillin Rash    PATIENT HAS HAD A PCN REACTION WITH IMMEDIATE RASH, FACIAL/TONGUE/THROAT SWELLING, SOB, OR LIGHTHEADEDNESS WITH HYPOTENSION: # # # YES # # #  HAS PT DEVELOPED SEVERE RASH INVOLVING MUCUS MEMBRANES or SKIN NECROSIS: # # # YES # # # "On my face"e Has patient had a PCN reaction that required hospitalization: No Has patient had a PCN reaction occurring within the last 10 years:  #  #  #  UNKNOWN  #  #  #      . Mobic [Meloxicam] Rash  . Montelukast Sodium Rash  . Phenergan [Promethazine Hcl] Nausea And Vomiting   Lab Results:  Results for orders placed or performed during the hospital encounter of 02/02/19 (from the past 48 hour(s))  Urine rapid drug screen (hosp performed)     Status: None   Collection Time: 02/02/19  8:15 PM  Result Value Ref Range   Opiates NONE DETECTED NONE DETECTED   Cocaine NONE DETECTED NONE DETECTED   Benzodiazepines NONE DETECTED NONE  DETECTED   Amphetamines NONE DETECTED NONE DETECTED   Tetrahydrocannabinol NONE DETECTED NONE DETECTED   Barbiturates NONE DETECTED NONE DETECTED    Comment: (NOTE) DRUG SCREEN FOR MEDICAL PURPOSES ONLY.  IF CONFIRMATION IS NEEDED FOR ANY PURPOSE, NOTIFY LAB WITHIN 5 DAYS. LOWEST DETECTABLE LIMITS FOR URINE DRUG SCREEN Drug Class                     Cutoff (ng/mL) Amphetamine and metabolites    1000 Barbiturate and metabolites    200 Benzodiazepine                 283 Tricyclics and metabolites     300 Opiates and metabolites        300 Cocaine and metabolites        300 THC                            50 Performed at [facility], [place] [address] [place], [place] [address]   Comprehensive metabolic panel     Status: Abnormal   Collection Time: 02/02/19  8:34 PM  Result Value Ref Range   Sodium 138 135 - 145 mmol/L   Potassium 3.0 (L) 3.5 - 5.1 mmol/L   Chloride 104 98 - 111 mmol/L   CO2 25 22 - 32 mmol/L   Glucose, Bld 143 (H) 70 - 99 mg/dL   BUN 13 6 - 20 mg/dL   Creatinine, Ser 0.62 0.44 - 1.00 mg/dL   Calcium 9.5 8.9 - 10.3 mg/dL   Total Protein 7.3 6.5 - 8.1 g/dL   Albumin 3.9 3.5 - 5.0 g/dL   AST 16 15 - 41 U/L   ALT 15 0 - 44 U/L   Alkaline Phosphatase 52 38 - 126 U/L   Total Bilirubin 0.3 0.3 - 1.2 mg/dL   GFR calc non Af Amer >60 >60 mL/min   GFR calc Af Amer >60 >60 mL/min   Anion gap 9 5 - 15  Comment: Performed at [facility], [place] [address], [place], [place] [address]  Ethanol     Status: None   Collection Time: 02/02/19  8:34 PM  Result Value Ref Range   Alcohol, Ethyl (B) <10 <10 mg/dL    Comment: (NOTE) Lowest detectable limit for serum alcohol is 10 mg/dL. For medical purposes only. Performed at [facility], [place] [address] [place], [place] [address]   CBC with Diff     Status: Abnormal   Collection Time: 02/02/19  8:34 PM  Result Value Ref Range   WBC 8.7 4.0 - 10.5 K/uL   RBC 4.23  3.87 - 5.11 MIL/uL   Hemoglobin 11.7 (L) 12.0 - 15.0 g/dL   HCT 36.5 36.0 - 46.0 %   MCV 86.3 80.0 - 100.0 fL   MCH 27.7 26.0 - 34.0 pg   MCHC 32.1 30.0 - 36.0 g/dL   RDW 13.9 11.5 - 15.5 %   Platelets 298 150 - 400 K/uL   nRBC 0.0 0.0 - 0.2 %   Neutrophils Relative % 71 %   Neutro Abs 6.2 1.7 - 7.7 K/uL   Lymphocytes Relative 21 %   Lymphs Abs 1.8 0.7 - 4.0 K/uL   Monocytes Relative 6 %   Monocytes Absolute 0.5 0.1 - 1.0 K/uL   Eosinophils Relative 1 %   Eosinophils Absolute 0.1 0.0 - 0.5 K/uL   Basophils Relative 1 %   Basophils Absolute 0.1 0.0 - 0.1 K/uL   Immature Granulocytes 0 %   Abs Immature Granulocytes 0.02 0.00 - 0.07 K/uL    Comment: Performed at [facility], [place] [address] [place], [place] [address]  Troponin I (High Sensitivity)     Status: Abnormal   Collection Time: 02/02/19  8:34 PM  Result Value Ref Range   Troponin I (High Sensitivity) 52 (H) <18 ng/L    Comment: (NOTE) Elevated high sensitivity troponin I (hsTnI) values and significant  changes across serial measurements may suggest ACS but many other  chronic and acute conditions are known to elevate hsTnI results.  Refer to the "Links" section for chest pain algorithms and additional  guidance. Performed at [facility], [place] [address], [place], [place] [address]   Troponin I (High Sensitivity)     Status: Abnormal   Collection Time: 02/02/19 10:43 PM  Result Value Ref Range   Troponin I (High Sensitivity) 72 (H) <18 ng/L    Comment: DELTA CHECK NOTED (NOTE) Elevated high sensitivity troponin I (hsTnI) values and significant  changes across serial measurements may suggest ACS but many other  chronic and acute conditions are known to elevate hsTnI results.  Refer to the Links section for chest pain algorithms and additional  guidance. Performed at [facility], [place] [address] [place], [place] [address]   SARS Coronavirus 2 [facility]  order, Performed in [facility] hospital lab) Nasopharyngeal Nasopharyngeal Swab     Status: None   Collection Time: 02/03/19  2:09 AM   Specimen: Nasopharyngeal Swab  Result Value Ref Range   SARS Coronavirus 2 NEGATIVE NEGATIVE    Comment: (NOTE) If result is NEGATIVE SARS-CoV-2 target nucleic acids are NOT DETECTED. The SARS-CoV-2 RNA is generally detectable in upper and lower  respiratory specimens during the acute phase of infection. The lowest  concentration of SARS-CoV-2 viral copies this assay can detect is 250  copies / mL. A negative result does not preclude SARS-CoV-2 infection  and should not be  used as the sole basis for treatment or other  patient management decisions.  A negative result may occur with  improper specimen collection / handling, submission of specimen other  than nasopharyngeal swab, presence of viral mutation(s) within the  areas targeted by this assay, and inadequate number of viral copies  (<250 copies / mL). A negative result must be combined with clinical  observations, patient history, and epidemiological information. If result is POSITIVE SARS-CoV-2 target nucleic acids are DETECTED. The SARS-CoV-2 RNA is generally detectable in upper and lower  respiratory specimens dur ing the acute phase of infection.  Positive  results are indicative of active infection with SARS-CoV-2.  Clinical  correlation with patient history and other diagnostic information is  necessary to determine patient infection status.  Positive results do  not rule out bacterial infection or co-infection with other viruses. If result is PRESUMPTIVE POSTIVE SARS-CoV-2 nucleic acids MAY BE PRESENT.   A presumptive positive result was obtained on the submitted specimen  and confirmed on repeat testing.  While 2019 novel coronavirus  (SARS-CoV-2) nucleic acids may be present in the submitted sample  additional confirmatory testing may be necessary for epidemiological  and / or  clinical management purposes  to differentiate between  SARS-CoV-2 and other Sarbecovirus currently known to infect humans.  If clinically indicated additional testing with an alternate test  methodology [phone]) is advised. The SARS-CoV-2 RNA is generally  detectable in upper and lower respiratory sp ecimens during the acute  phase of infection. The expected result is Negative. Fact Sheet for Patients:  StrictlyIdeas.no Fact Sheet for Healthcare Providers: BankingDealers.co.za This test is not yet approved or cleared by the [place] FDA and has been authorized for detection and/or diagnosis of SARS-CoV-2 by FDA under an Emergency Use Authorization (EUA).  This EUA will remain in effect (meaning this test can be used) for the duration of the COVID-19 declaration under Section 564(b)(1) of the Act, 21 U.S.C. section 360bbb-3(b)(1), unless the authorization is terminated or revoked sooner. Performed at [facility], [place] [address] [place], [place] [address]     Blood Alcohol level:  Lab Results  Component Value Date   ETH <10 64/68/0321    Metabolic Disorder Labs:  Lab Results  Component Value Date   HGBA1C 5.8 (A) 11/20/2018   MPG 116.89 06/01/2017   MPG 137 08/04/2014   No results found for: PROLACTIN Lab Results  Component Value Date   CHOL 152 08/05/2014   TRIG 62 08/05/2014   HDL 38 (L) 08/05/2014   CHOLHDL 4.0 08/05/2014   VLDL 12 08/05/2014   LDLCALC 102 (H) 08/05/2014   LDLCALC 123 (H) 04/08/2010    Current Medications: Current Facility-Administered Medications  Medication Dose Route Frequency Provider Last Rate Last Dose  . acetaminophen (TYLENOL) tablet 650 mg  650 mg Oral Q6H PRN Patriciaann Clan E, PA-C      . albuterol (VENTOLIN HFA) 108 (90 Base) MCG/ACT inhaler 1-2 puff  1-2 puff Inhalation Q4H Patriciaann Clan E, PA-C   2 puff at 02/03/19 1210  . alum & mag hydroxide-simeth  (MAALOX/MYLANTA) 200-200-20 MG/5ML suspension 30 mL  30 mL Oral Q4H PRN Patriciaann Clan E, PA-C      . amLODipine (NORVASC) tablet 10 mg  10 mg Oral Daily Sharma Covert, MD   10 mg at 02/03/19 1054  . aspirin EC tablet 81 mg  81 mg Oral BID Sharma Covert, MD   81 mg at 02/03/19 1054  . fluticasone (  FLOVENT HFA) 44 MCG/ACT inhaler 2 puff  2 puff Inhalation BID Sharma Covert, MD   2 puff at 02/03/19 1054  . hydrOXYzine (ATARAX/VISTARIL) tablet 25 mg  25 mg Oral Q6H PRN Patriciaann Clan E, PA-C      . magnesium hydroxide (MILK OF MAGNESIA) suspension 30 mL  30 mL Oral Daily PRN Laverle Hobby, PA-C      . traZODone (DESYREL) tablet 50 mg  50 mg Oral QHS,MR X 1 Simon, Spencer E, PA-C       PTA Medications: Medications Prior to Admission  Medication Sig Dispense Refill Last Dose  . acetaminophen (TYLENOL) 500 MG tablet Take 1,000 mg by mouth every 6 (six) hours as needed for moderate pain.     Marland Kitchen albuterol (PROVENTIL HFA;VENTOLIN HFA) 108 (90 Base) MCG/ACT inhaler Inhale 2 puffs into the lungs every 6 (six) hours as needed for wheezing or shortness of breath. 1 Inhaler 2   . albuterol (PROVENTIL) (2.5 MG/3ML) 0.083% nebulizer solution Take 3 mLs (2.5 mg total) by nebulization every 6 (six) hours as needed for wheezing or shortness of breath. 75 mL 2   . amLODipine (NORVASC) 10 MG tablet TAKE 1 TABLET BY MOUTH EVERY DAY (Patient taking differently: Take 10 mg by mouth daily. ) 90 tablet 2   . aspirin 81 MG chewable tablet Chew 1 tablet (81 mg total) by mouth 2 (two) times daily. (Patient not taking: Reported on 02/02/2019) 60 tablet 1   . budesonide-formoterol (SYMBICORT) 80-4.5 MCG/ACT inhaler INHALE TWO PUFFS BY MOUTH INTO LUNGS TWICE DAILY (Patient not taking: Reported on 02/02/2019) 1 Inhaler 11   . budesonide-formoterol (SYMBICORT) 80-4.5 MCG/ACT inhaler Inhale 2 puffs into the lungs 2 (two) times daily.     . cetirizine (ZYRTEC) 10 MG tablet Take 10 mg by mouth daily.     .  cholecalciferol (VITAMIN D) 1000 units tablet Take 1 tablet (1,000 Units total) by mouth daily. 60 tablet 2   . diclofenac sodium (VOLTAREN) 1 % GEL Apply 4 g topically 4 (four) times daily. (Patient taking differently: Apply 4 g topically 4 (four) times daily as needed (pain). ) 100 g 3   . docusate sodium (COLACE) 100 MG capsule Take 1 capsule (100 mg total) by mouth 2 (two) times daily. (Patient not taking: Reported on 02/02/2019) 60 capsule 0   . ferrous sulfate 325 (65 FE) MG tablet Take 1 tablet (325 mg total) by mouth daily. (Patient not taking: Reported on 02/02/2019) 30 tablet 3   . fluticasone (FLONASE) 50 MCG/ACT nasal spray USE TWO SPRAY(S) IN EACH NOSTRIL ONCE DAILY (Patient taking differently: Place 2 sprays into both nostrils daily as needed for allergies or rhinitis. ) 16 g 0   . ibuprofen (ADVIL,MOTRIN) 200 MG tablet Take 400-600 mg by mouth every 6 (six) hours as needed for moderate pain.      Marland Kitchen ketoconazole (NIZORAL) 2 % cream Apply 1 application topically daily. (Patient taking differently: Apply 1 application topically daily as needed for irritation. ) 15 g 0   . senna (SENOKOT) 8.6 MG TABS tablet Take 2 tablets (17.2 mg total) by mouth at bedtime. (Patient not taking: Reported on 02/02/2019) 120 each 0   . vitamin B-12 (CYANOCOBALAMIN) 1000 MCG tablet Take 1,000 mcg by mouth daily.     . Vitamin D, Ergocalciferol, (DRISDOL) 1.25 MG (50000 UT) CAPS capsule Take 1 capsule (50,000 Units total) by mouth every 7 (seven) days. (Patient not taking: Reported on 02/02/2019) 8 capsule 0  Musculoskeletal: Strength & Muscle Tone: within normal limits Gait & Station: normal Patient leans: N/A  Psychiatric Specialty Exam: Physical Exam  Nursing note and vitals reviewed. Constitutional: She is oriented to person, place, and time. She appears well-developed.  HENT:  Head: Normocephalic and atraumatic.  Respiratory: Effort normal.  Neurological: She is alert and oriented to person, place,  and time.    ROS  Blood pressure (!) 119/95, pulse 99, temperature 97.8 F (36.6 C), temperature source Oral, resp. rate 20, height 5\' 1"  (1.549 m), weight 80.7 kg, SpO2 100 %.Body mass index is 33.63 kg/m.  General Appearance: Casual  Eye Contact:  Fair  Speech:  Normal Rate  Volume:  Normal  Mood:  Anxious  Affect:  Congruent  Thought Process:  Coherent and Descriptions of Associations: Intact  Orientation:  Full (Time, Place, and Person)  Thought Content:  Logical  Suicidal Thoughts:  No  Homicidal Thoughts:  No  Memory:  Immediate;   Fair Recent;   Fair Remote;   Fair  Judgement:  Intact  Insight:  Lacking  Psychomotor Activity:  Increased  Concentration:  Concentration: Fair and Attention Span: Fair  Recall:  [employer] of Knowledge:  Fair  Language:  Good  Akathisia:  Negative  Handed:  Right  AIMS (if indicated):     Assets:  Desire for Improvement Resilience  ADL's:  Intact  Cognition:  WNL  Sleep:       Treatment Plan Summary: Daily contact with patient to assess and evaluate symptoms and progress in treatment, Medication management and Plan : Patient is seen and examined.  Patient is a 53 year old female with the above stated past psychiatric history who presented to the [facility] emergency department with homicidal ideation.  She has a past psychiatric history that is negative, she denied any substances or alcohol.  Her blood alcohol was less than 10 and her drug screen was negative.  She has no previous psychiatric admissions.  She currently denies suicidal or homicidal ideation.  She will be admitted to the hospital.  She will be integrated into the milieu.  She will be encouraged to attend groups.  We will contact her brother and get collateral information.  She plans on moving out, and so we will try and confirm all that information.  Right now we will just use the trazodone and hydroxyzine on the standing orders.  I do not think she needs any  psychiatric medicines beyond that at least at this point.  Review of her laboratories showed a mildly low potassium, and elevated blood sugar at 143, and a troponin that was elevated at 72.  Her EKG showed a left bundle branch block.  Her work-up in the emergency department made the physician believe that this was not a cardiac situation.  Her blood pressure this morning is 119/95.  She stated she takes 10 mg of amlodipine, and that will be restarted.  Her potassium will be supplemented.  Observation Level/Precautions:  15 minute checks  Laboratory:  Chemistry Profile  Psychotherapy:    Medications:    Consultations:    Discharge Concerns:    Estimated LOS:  Other:     Physician Treatment Plan for Primary Diagnosis: <principal problem not specified> Long Term Goal(s): Improvement in symptoms so as ready for discharge  Short Term Goals: Ability to identify changes in lifestyle to reduce recurrence of condition will improve, Ability to verbalize feelings will improve, Ability to demonstrate self-control will improve, Ability to identify and develop  effective coping behaviors will improve and Ability to maintain clinical measurements within normal limits will improve  Physician Treatment Plan for Secondary Diagnosis: Active Problems:   MDD (major depressive disorder), severe ([place])  Long Term Goal(s): Improvement in symptoms so as ready for discharge  Short Term Goals: Ability to identify changes in lifestyle to reduce recurrence of condition will improve, Ability to verbalize feelings will improve, Ability to demonstrate self-control will improve, Ability to identify and develop effective coping behaviors will improve and Ability to maintain clinical measurements within normal limits will improve  I certify that inpatient services furnished can reasonably be expected to improve the patient's condition.    Sharma Covert, MD 8/1/20202:40 PM

## 2019-02-03 NOTE — BH Assessment (Addendum)
Tele Assessment Note   Patient Name: [name] MRN: [id number] Referring Physician: Recardo Evangelist, PA-C Location of Patient: [facility] Location of Provider: [place] Department  [name] is an 53 y.o. female who presents to the ED voluntarily. Pt reports she became upset with her nephew and had thoughts of killing him. Pt states she feels this has been building up for the past several months due to ongoing conflict with her nephew. Pt states she and her brother live together, however her nephew was recently released from jail in [place] and was offered to come live with them. Pt states ever since he has been there, there has been turmoil and chaos. Pt states the nephew also brought his girlfriend down from [place] to live with them as well without asking and she states she does not know who the girlfriend is. Pt states the nephew has been arguing with her brother for the past several weeks and disrupting the home. Pt states today she became so enraged today that she envisioned killing her nephew. Pt states she continued thinking of different ways to do it including stabbing him or "jumping on him." Pt states she keeps knives in her bedroom and she feels that she might snap and kill her nephew. Pt states PTA she became so angry that she "felt like a devil spirit was on her." Pt states she called her Godmother and family member to pray for her because she wanted to kill her nephew. Pt states she went outside and tried to calm herself down by praying and crying but she continued to have thoughts and pictured herself killing her nephew. Pt states her nephew has been triggering her for the past several weeks by being disrespectful, being loud, not cleaning in the home, and agitating people in the house. Pt states her nephew was making threats that "he used to be in a gang and said he would burn everyone alive cause he is from the streets." Pt states she continued to feel herself trembling because  she was so angry and kept thinking of all the ways she wanted to kill him. Pt states "if I had a gun I would have used it."  TTS consulted with Ann Clan, PA who recommends inpt tx. TTS to seek placement. EDP Ann Evangelist, PA-C and pt's nurse Ann Marvel, RN have been advised.   Diagnosis: MDD, single episode, w/o psychosis; Adjustment d/o with anxiety   Past Medical History:  Past Medical History:  Diagnosis Date  . Anemia   . Asthma   . Bursitis of left hip   . Decreased appetite   . GERD (gastroesophageal reflux disease)   . History of hiatal hernia    repaired  . History of stomach ulcers   . Hypertension   . Migraine    "maybe twice in the last 10 yrs" (10/16/2013)  . PONV (postoperative nausea and vomiting)   . Shortness of breath dyspnea     Past Surgical History:  Procedure Laterality Date  . BARTHOLIN GLAND CYST EXCISION    . BREAST LUMPECTOMY WITH RADIOACTIVE SEED LOCALIZATION Left 01/23/2016   Procedure: BREAST LUMPECTOMY WITH RADIOACTIVE SEED LOCALIZATION;  Surgeon: Jackolyn Confer, MD;  Location: [place];  Service: General;  Laterality: Left;  . CESAREAN SECTION  1989  . CHOLECYSTECTOMY  ~ 2000  . EPIGASTRIC HERNIA REPAIR  07/2003  . HERNIA REPAIR    . INCISION AND DRAINAGE ABSCESS  10/2005   Bartholin abscess.  Marland Kitchen  TONSILLECTOMY AND ADENOIDECTOMY  1983  . 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 TOTAL HIP ARTHROPLASTY Right 06/06/2017   Procedure: RIGHT TOTAL HIP ARTHROPLASTY ANTERIOR APPROACH;  Surgeon: Rod Can, MD;  Location: [place];  Service: Orthopedics;  Laterality: Right;  Needs RNFA  . TUBAL LIGATION  1994    Family History:  Family History  Problem Relation Age of Onset  . Cancer Mother 49       unknown  . Heart attack Father 60  . Heart attack Sister 3  . Diabetes Sister     Social History:  reports that she quit smoking about 4 years ago. Her smoking use included cigarettes. She quit after 1.00 year of use. She has never  used smokeless tobacco. She reports current alcohol use. She reports that she does not use drugs.  Additional Social History:  Alcohol / Drug Use Pain Medications: See MAR Prescriptions: See MAR Over the Counter: See MAR History of alcohol / drug use?: No history of alcohol / drug abuse  CIWA: CIWA-Ar BP: 132/66 Pulse Rate: 79 COWS:    Allergies:  Allergies  Allergen Reactions  . Amoxicillin Rash    PATIENT HAS HAD A PCN REACTION WITH IMMEDIATE RASH, FACIAL/TONGUE/THROAT SWELLING, SOB, OR LIGHTHEADEDNESS WITH HYPOTENSION:  #  #  #  YES  #  #  #   HAS PT DEVELOPED SEVERE RASH INVOLVING MUCUS MEMBRANES or SKIN NECROSIS: #  #  #  YES  #  #  #     "On my face" Has patient had a PCN reaction that required hospitalization: No Has patient had a PCN reaction occurring within the last 10 years:  #  #  #  UNKNOWN  #  #  #  .   Marland Kitchen Ampicillin Rash    PATIENT HAS HAD A PCN REACTION WITH IMMEDIATE RASH, FACIAL/TONGUE/THROAT SWELLING, SOB, OR LIGHTHEADEDNESS WITH HYPOTENSION: # # # YES # # #  HAS PT DEVELOPED SEVERE RASH INVOLVING MUCUS MEMBRANES or SKIN NECROSIS: # # # YES # # # "On my face"e Has patient had a PCN reaction that required hospitalization: No Has patient had a PCN reaction occurring within the last 10 years:  #  #  #  UNKNOWN  #  #  #      . Mobic [Meloxicam] Rash  . Montelukast Sodium Rash  . Phenergan [Promethazine Hcl] Nausea And Vomiting    Home Medications: (Not in a hospital admission)   OB/GYN Status:  No LMP recorded.  General Assessment Data Location of Assessment: [facility] TTS Assessment: In system Is this a Tele or Face-to-Face Assessment?: Tele Assessment Is this an Initial Assessment or a Re-assessment for this encounter?: Initial Assessment Patient Accompanied by:: N/A Language Other than English: No Living Arrangements: Other (Comment) What gender do you identify as?: Female Marital status: Single Pregnancy Status: No Living Arrangements: Other  relatives(brother, nephew ) Can pt return to current living arrangement?: Yes Admission Status: Voluntary Is patient capable of signing voluntary admission?: Yes Referral Source: Self/Family/Friend Insurance type: BCBS     Crisis Care Plan Living Arrangements: Other relatives(brother, nephew ) Name of Psychiatrist: none Name of Therapist: none  Education Status Is patient currently in school?: No Is the patient employed, unemployed or receiving disability?: Employed  Risk to self with the past 6 months Suicidal Ideation: No Has patient been a risk to self within the past 6 months prior to admission? : No Suicidal Intent: No Has patient had any suicidal  intent within the past 6 months prior to admission? : No Is patient at risk for suicide?: No Suicidal Plan?: No Has patient had any suicidal plan within the past 6 months prior to admission? : No Access to Means: No What has been your use of drugs/alcohol within the last 12 months?: denies  Previous Attempts/Gestures: No Triggers for Past Attempts: None known Intentional Self Injurious Behavior: None Family Suicide History: No Recent stressful life event(s): Conflict (Comment)(argue with nephew) Persecutory voices/beliefs?: No Depression: Yes Depression Symptoms: Feeling angry/irritable Substance abuse history and/or treatment for substance abuse?: No Suicide prevention information given to non-admitted patients: Not applicable  Risk to Others within the past 6 months Homicidal Ideation: Yes-Currently Present Does patient have any lifetime risk of violence toward others beyond the six months prior to admission? : Yes (comment)(thoughts of hurting nephew) Thoughts of Harm to Others: Yes-Currently Present Comment - Thoughts of Harm to Others: pt says she has thoughts of hurting her nephew  Current Homicidal Intent: No Current Homicidal Plan: No-Not Currently/Within Last 6 Months Access to Homicidal Means: Yes Describe  Access to Homicidal Means: pt says she had thoughts of grabbing a knife or doing something to her nephew  Identified Victim: nephew History of harm to others?: No Assessment of Violence: None Noted Does patient have access to weapons?: Yes (Comment)(has knives in room) Criminal Charges Pending?: No Does patient have a court date: No Is patient on probation?: No  Psychosis Hallucinations: None noted Delusions: None noted  Mental Status Report Appearance/Hygiene: Disheveled Eye Contact: Good Motor Activity: Freedom of movement Speech: Pressured, Tangential Level of Consciousness: Alert Mood: Anxious, Depressed Affect: Anxious, Depressed, Threatening Anxiety Level: Severe Thought Processes: Tangential Judgement: Impaired Orientation: Person, Place, Time, Situation, Appropriate for developmental age Obsessive Compulsive Thoughts/Behaviors: None  Cognitive Functioning Concentration: Normal Memory: Remote Intact, Recent Intact Is patient IDD: No Insight: Poor Impulse Control: Poor Appetite: Good Have you had any weight changes? : No Change Sleep: Decreased Total Hours of Sleep: 4 Vegetative Symptoms: None  ADLScreening [facility] Assessment Services) Patient's cognitive ability adequate to safely complete daily activities?: Yes Patient able to express need for assistance with ADLs?: Yes Independently performs ADLs?: Yes (appropriate for developmental age)  Prior Inpatient Therapy Prior Inpatient Therapy: No  Prior Outpatient Therapy Prior Outpatient Therapy: No Does patient have an ACCT team?: No Does patient have Intensive In-House Services?  : No Does patient have [facility] services? : No Does patient have P4CC services?: No  ADL Screening (condition at time of admission) Patient's cognitive ability adequate to safely complete daily activities?: Yes Is the patient deaf or have difficulty hearing?: No Does the patient have difficulty seeing, even when wearing  glasses/contacts?: No Does the patient have difficulty concentrating, remembering, or making decisions?: No Patient able to express need for assistance with ADLs?: Yes Does the patient have difficulty dressing or bathing?: No Independently performs ADLs?: Yes (appropriate for developmental age) Does the patient have difficulty walking or climbing stairs?: No Weakness of Legs: None Weakness of Arms/Hands: None  Home Assistive Devices/Equipment Home Assistive Devices/Equipment: None    Abuse/Neglect Assessment (Assessment to be complete while patient is alone) Abuse/Neglect Assessment Can Be Completed: Yes Physical Abuse: Denies Verbal Abuse: Denies Sexual Abuse: Denies Exploitation of patient/patient's resources: Denies Self-Neglect: Denies     Regulatory affairs officer (For Healthcare) Does Patient Have a Medical Advance Directive?: No Would patient like information on creating a medical advance directive?: No - Patient declined  Disposition: TTS consulted with Ann Clan, PA who recommends inpt tx. TTS to seek placement. EDP Ann Evangelist, PA-C and pt's nurse Ann Marvel, RN have been advised.   Disposition Initial Assessment Completed for this Encounter: Yes Disposition of Patient: Admit Type of inpatient treatment program: Adult Patient refused recommended treatment: No Mode of transportation if patient is discharged/movement?: [place]  This service was provided via telemedicine using a 2-way, interactive audio and Radiographer, therapeutic.  Names of all persons participating in this telemedicine service and their role in this encounter. Name: [name] Role: Patient  Name: Lind Covert Role: TTS          Lyanne Co 02/03/2019 1:22 AM

## 2019-02-04 DIAGNOSIS — F4324 Adjustment disorder with disturbance of conduct: Secondary | ICD-10-CM

## 2019-02-04 MED ORDER — TRAZODONE HCL 50 MG PO TABS: 50.0000 mg | ORAL_TABLET | Freq: Every evening | ORAL | 0 refills | Status: DC | PRN

## 2019-02-04 NOTE — Plan of Care (Signed)
  Problem: Education: °Goal: Utilization of techniques to improve thought processes will improve °Outcome: Adequate for Discharge °Goal: Knowledge of the prescribed therapeutic regimen will improve °Outcome: Adequate for Discharge °  °Problem: Activity: °Goal: Interest or engagement in leisure activities will improve °Outcome: Adequate for Discharge °Goal: Imbalance in normal sleep/wake cycle will improve °Outcome: Adequate for Discharge °  °Problem: Coping: °Goal: Coping ability will improve °Outcome: Adequate for Discharge °Goal: Will verbalize feelings °Outcome: Adequate for Discharge °  °Problem: Health Behavior/Discharge Planning: °Goal: Ability to make decisions will improve °Outcome: Adequate for Discharge °Goal: Compliance with therapeutic regimen will improve °Outcome: Adequate for Discharge °  °Problem: Role Relationship: °Goal: Will demonstrate positive changes in social behaviors and relationships °Outcome: Adequate for Discharge °  °Problem: Safety: °Goal: Ability to disclose and discuss suicidal ideas will improve °Outcome: Adequate for Discharge °Goal: Ability to identify and utilize support systems that promote safety will improve °Outcome: Adequate for Discharge °  °Problem: Self-Concept: °Goal: Will verbalize positive feelings about self °Outcome: Adequate for Discharge °Goal: Level of anxiety will decrease °Outcome: Adequate for Discharge °  °Problem: Education: °Goal: Knowledge of [facility] Education information/materials will improve °Outcome: Adequate for Discharge °Goal: Emotional status will improve °Outcome: Adequate for Discharge °Goal: Mental status will improve °Outcome: Adequate for Discharge °Goal: Verbalization of understanding the information provided will improve °Outcome: Adequate for Discharge °  °Problem: Activity: °Goal: Interest or engagement in activities will improve °Outcome: Adequate for Discharge °Goal: Sleeping patterns will improve °Outcome: Adequate for  Discharge °  °Problem: Coping: °Goal: Ability to verbalize frustrations and anger appropriately will improve °Outcome: Adequate for Discharge °Goal: Ability to demonstrate self-control will improve °Outcome: Adequate for Discharge °  °Problem: Health Behavior/Discharge Planning: °Goal: Identification of resources available to assist in meeting health care needs will improve °Outcome: Adequate for Discharge °Goal: Compliance with treatment plan for underlying cause of condition will improve °Outcome: Adequate for Discharge °  °Problem: Physical Regulation: °Goal: Ability to maintain clinical measurements within normal limits will improve °Outcome: Adequate for Discharge °  °Problem: Safety: °Goal: Periods of time without injury will increase °Outcome: Adequate for Discharge °  °

## 2019-02-04 NOTE — Progress Notes (Signed)
D: Pt A & O X 3. Denies SI, HI, AVH and pain at this time. D/C home as ordered. Picked up in lobby by family.  A: D/C instructions reviewed with pt including prescriptions and follow up with EAP, compliance encouraged. All belongings from locker #26 given (including all money) to pt at time of departure. Scheduled and PRN medications given with verbal education and effects monitored. Safety checks maintained without incident till time of d/c.  R: Pt receptive to care. Compliant with medications when offered. Denies adverse drug reactions when assessed. Verbalized understanding related to d/c instructions. Signed belonging sheet in agreement with items received from locker. Ambulatory with a steady gait. Appears to be in no physical distress at time of departure.

## 2019-02-04 NOTE — Progress Notes (Signed)
D: Patient presents calm and cooperative. She is less angry towards her nephew today and denies HI. Patient denies SI/HI/AVH.  A: Patient checked q15 min, and checks reviewed. Reviewed medication changes with patient and educated on side effects. Educated patient on importance of attending group therapy sessions and educated on several coping skills. Encouarged participation in milieu through recreation therapy and attending meals with peers. Support and encouragement provided. Fluids offered. R: Patient receptive to education on medications, and is medication compliant. Patient contracts for safety on the unit.

## 2019-02-04 NOTE — Progress Notes (Signed)
Pt has been observed in the dayroom with peers. Pt stated she had a good day. Pt stated her plan is to move out of where she is living now, she is renting aprt so as to stay away from her family which has caused her to be here in the hospital. 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 Pt complained of reflux and seasonal allergies, medications given as scheduled. Pt's safety ensured with 15 minute and environmental checks. Pt currently denies SI/HI and A/V hallucinations. Pt verbally agrees to seek staff if SI/HI or A/VH occurs and to consult with staff before acting on these thoughts. Will continue POC.

## 2019-02-04 NOTE — Progress Notes (Signed)
   02/04/19 0908  COVID-19 Daily Checkoff  Have you had a fever (temp > 37.80C/100F)  in the past 24 hours?  No  If you have had runny nose, nasal congestion, sneezing in the past 24 hours, has it worsened? No  COVID-19 EXPOSURE  Have you traveled outside the state in the past 14 days? No  Have you been in contact with someone with a confirmed diagnosis of COVID-19 or PUI in the past 14 days without wearing appropriate PPE? No  Have you been living in the same home as a person with confirmed diagnosis of COVID-19 or a PUI (household contact)? No  Have you been diagnosed with COVID-19? No

## 2019-02-04 NOTE — BHH Counselor (Signed)
Clinical Social Work Note  Patient is leaving in less than 72 hours, no Psychosocial Assessment required.  Selmer Dominion, LCSW 02/04/2019, 10:18 AM

## 2019-02-04 NOTE — BHH Suicide Risk Assessment (Signed)
[place] INPATIENT:  Family/Significant Other Suicide Prevention Education  Suicide Prevention Education:  Education Completed; Coralee Pesa [phone],  (name of family member/significant other) has been identified by the patient as the family member/significant other with whom the patient will be residing, and identified as the person(s) who will aid the patient in the event of a mental health crisis (suicidal ideations/suicide attempt).  With written consent from the patient, the family member/significant other has been provided the following suicide prevention education, prior to the and/or following the discharge of the patient.  Daughter talks to pt daily and advises her in positive ways to take care of her mental health and to not take her anger out on others.  The suicide prevention education provided includes the following:  Suicide risk factors  Suicide prevention and interventions  National Suicide Hotline telephone number  [facility] assessment telephone number  [facility] Emergency Assistance [place] and/or Residential Mobile Crisis Unit telephone number  Request made of family/significant other to:  Remove weapons (e.g., guns, rifles, knives), all items previously/currently identified as safety concern.    Remove drugs/medications (over-the-counter, prescriptions, illicit drugs), all items previously/currently identified as a safety concern.  The family member/significant other verbalizes understanding of the suicide prevention education information provided.  The family member/significant other agrees to remove the items of safety concern listed above.  Berlin Hun Grossman-Orr 02/04/2019, 10:11 AM

## 2019-02-04 NOTE — BHH Suicide Risk Assessment (Signed)
[facility] Discharge Suicide Risk Assessment   Principal Problem: MDD (major depressive disorder), severe ([place]) Discharge Diagnoses: Principal Problem:   MDD (major depressive disorder), severe ([place])   Total Time spent with patient: 15 minutes  Musculoskeletal: Strength & Muscle Tone: within normal limits Gait & Station: normal Patient leans: N/A  Psychiatric Specialty Exam: Review of Systems  All other systems reviewed and are negative.   Blood pressure 134/85, pulse (!) 102, temperature 98.7 F (37.1 C), temperature source Oral, resp. rate 20, height 5\' 1"  (1.549 m), weight 80.7 kg, SpO2 100 %.Body mass index is 33.63 kg/m.  General Appearance: Casual  Eye Contact::  Good  Speech:  Normal Rate409  Volume:  Normal  Mood:  Euthymic  Affect:  Congruent  Thought Process:  Coherent and Descriptions of Associations: Intact  Orientation:  Full (Time, Place, and Person)  Thought Content:  Logical  Suicidal Thoughts:  No  Homicidal Thoughts:  No  Memory:  Immediate;   Fair Recent;   Fair Remote;   Fair  Judgement:  Intact  Insight:  Fair  Psychomotor Activity:  Normal  Concentration:  Good  Recall:  Good  Fund of Knowledge:Good  Language: Good  Akathisia:  Negative  Handed:  Right  AIMS (if indicated):     Assets:  Communication Skills Desire for Improvement Resilience  Sleep:  Number of Hours: 6.75  Cognition: WNL  ADL's:  Intact   Mental Status Per Nursing Assessment::   On Admission:  Plan to harm others, Intention to act on plan to harm others  Demographic Factors:  Low socioeconomic status  Loss Factors: NA  Historical Factors: Impulsivity  Risk Reduction Factors:   Employed, Living with another person, especially a relative and Positive coping skills or problem solving skills  Continued Clinical Symptoms:  Severe Anxiety and/or Agitation  Cognitive Features That Contribute To Risk:  None    Suicide Risk:  Minimal: No identifiable suicidal ideation.   Patients presenting with no risk factors but with morbid ruminations; may be classified as minimal risk based on the severity of the depressive symptoms  Follow-up Information    Patient will be going to her Employee Assistance Program for counseling Follow up.   Why: Patient declines other referrals, states she will go to her employer EAP for counseling.  She is not on medication and declines an appointment with her primary care physician at this time, but will follow up as needed.          Plan Of Care/Follow-up recommendations:  Activity:  ad lib  Sharma Covert, MD 02/04/2019, 11:11 AM

## 2019-02-04 NOTE — Progress Notes (Signed)
  [facility] Adult Case Management Discharge Plan :  Will you be returning to the same living situation after discharge:  Yes,  home At discharge, do you have transportation home?: Yes,  best friend Do you have the ability to pay for your medications: Yes,  insurance and income  Release of information consent forms are not needed.  Patient to Follow up at: Follow-up Information    Patient will be going to her Employee Assistance Program for counseling Follow up.   Why: Patient declines other referrals, states she will go to her employer EAP for counseling.  She is not on medication and declines an appointment with her primary care physician at this time, but will follow up as needed.          Next level of care provider has access to [place] and Suicide Prevention discussed: Yes,  with daughter  Have you used any form of tobacco in the last 30 days? (Cigarettes, Smokeless Tobacco, Cigars, and/or Pipes): No  Has patient been referred to the Quitline?: N/A patient is not a smoker  Patient has been referred for addiction treatment: N/A  Maretta Los, LCSW 02/04/2019, 10:20 AM

## 2019-02-04 NOTE — Discharge Summary (Signed)
Physician Discharge Summary Note  Patient:  [name] is an 53 y.o., female MRN:  [id number] DOB:  1966/01/03 Patient phone:  [phone] (home)  Patient address:   [address] [place] [address],  Total Time spent with patient: 15 minutes  Date of Admission:  02/03/2019 Date of Discharge: 02/04/2019  Reason for Admission: Per assessment note: [name] is an 53 y.o. female who presents to the ED voluntarily. Pt reports she became upset with her nephew and had thoughts of killing him. Pt states she feels this has been building up for the past several months due to ongoing conflict with her nephew. Pt states she and her brother live together, however her nephew was recently released from jail in [place] and was offered to come live with them. Pt states ever since he has been there, there has been turmoil and chaos. Pt states the nephew also brought his girlfriend down from [place] to live with them as well without asking and she states she does not know who the girlfriend is. Pt states the nephew has been arguing with her brother for the past several weeks and disrupting the home. Pt states today she became so enraged today that she envisioned killing her nephew. Pt states she continued thinking of different ways to do it including stabbing him or "jumping on him." Pt states she keeps knives in her bedroom and she feels that she might snap and kill her nephew. Pt states PTA she became so angry that she "felt like a devil spirit was on her."   Principal Problem: MDD (major depressive disorder), severe ([place]) Discharge Diagnoses: Principal Problem:   MDD (major depressive disorder), severe ([place])   Past Psychiatric History:   Past Medical History:  Past Medical History:  Diagnosis Date  . Anemia   . Asthma   . Bursitis of left hip   . Decreased appetite   . GERD (gastroesophageal reflux disease)   . History of hiatal hernia    repaired  . History of stomach ulcers   . Hypertension    . Migraine    "maybe twice in the last 10 yrs" (10/16/2013)  . PONV (postoperative nausea and vomiting)   . Shortness of breath dyspnea     Past Surgical History:  Procedure Laterality Date  . BARTHOLIN GLAND CYST EXCISION    . BREAST LUMPECTOMY WITH RADIOACTIVE SEED LOCALIZATION Left 01/23/2016   Procedure: BREAST LUMPECTOMY WITH RADIOACTIVE SEED LOCALIZATION;  Surgeon: Jackolyn Confer, MD;  Location: [facility];  Service: General;  Laterality: Left;  . CESAREAN SECTION  1989  . CHOLECYSTECTOMY  ~ 2000  . EPIGASTRIC HERNIA REPAIR  07/2003  . HERNIA REPAIR    . INCISION AND DRAINAGE ABSCESS  10/2005   Bartholin abscess.  . TONSILLECTOMY AND ADENOIDECTOMY  1983  . 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 TOTAL HIP ARTHROPLASTY Right 06/06/2017   Procedure: RIGHT TOTAL HIP ARTHROPLASTY ANTERIOR APPROACH;  Surgeon: Rod Can, MD;  Location: [facility];  Service: Orthopedics;  Laterality: Right;  Needs RNFA  . TUBAL LIGATION  1994   Family History:  Family History  Problem Relation Age of Onset  . Cancer Mother 88       unknown  . Heart attack Father 70  . Heart attack Sister 20  . Diabetes Sister    Family Psychiatric  History:  Social History:  Social History   Substance and Sexual Activity  Alcohol Use Yes  . Alcohol/week: 0.0 standard drinks  Comment: socially     Social History   Substance and Sexual Activity  Drug Use No    Social History   Socioeconomic History  . Marital status: Single    Spouse name: Not on file  . Number of children: Not on file  . Years of education: Not on file  . Highest education level: Not on file  Occupational History  . Occupation: residential worker  Social Needs  . Financial resource strain: Not on file  . Food insecurity    Worry: Not on file    Inability: Not on file  . Transportation needs    Medical: Not on file    Non-medical: Not on file  Tobacco Use  . Smoking status: Former Smoker    Years: 1.00    Types: Cigarettes     Quit date: 06/01/2014    Years since quitting: 4.6  . Smokeless tobacco: Never Used  Substance and Sexual Activity  . Alcohol use: Yes    Alcohol/week: 0.0 standard drinks    Comment: socially  . Drug use: No  . Sexual activity: Yes  Lifestyle  . Physical activity    Days per week: Not on file    Minutes per session: Not on file  . Stress: Not on file  Relationships  . Social Herbalist on phone: Not on file    Gets together: Not on file    Attends religious service: Not on file    Active member of club or organization: Not on file    Attends meetings of clubs or organizations: Not on file    Relationship status: Not on file  Other Topics Concern  . Not on file  Social History Narrative  . Not on file    Hospital Course:  Ani L Abdo was admitted for MDD (major depressive disorder), severe ([place]) and crisis management.  Pt was treated discharged with the medications listed below under Medication List.  Medical problems were identified and treated as needed.  Home medications were restarted as appropriate.  Improvement was monitored by observation and Latreshia L Repinski 's daily report of symptom reduction.  Emotional and mental status was monitored by daily self-inventory reports completed by Fara L Schlie and clinical staff.         Taneia L Bishop was evaluated by the treatment team for stability and plans for continued recovery upon discharge. Akyia L Plucinski 's motivation was an integral factor for scheduling further treatment. Employment, transportation, bed availability, health status, family support, and any pending legal issues were also considered during hospital stay. Pt was offered further treatment options upon discharge including but not limited to Residential, Intensive Outpatient, and Outpatient treatment.  Miia L Grabill will follow up with the services as listed below under Follow Up Information.     Upon completion of this admission the patient was both  mentally and medically stable for discharge denying suicidal/homicidal ideation, auditory/visual/tactile hallucinations, delusional thoughts and paranoia.     Rasheka L Celestine responded well to treatment with Trazodone 50 mg without adverse effects. Pt demonstrated improvement without reported or observed adverse effects to the point of stability appropriate for outpatient management. Pertinent labs include: CBC, Troponin and Glucose  for which outpatient follow-up is necessary for lab recheck as mentioned below. Reviewed CBC, CMP, BAL, and UDS; all unremarkable aside from noted exceptions.   Physical Findings: AIMS:  , ,  ,  ,    CIWA:    COWS:  Musculoskeletal: Strength & Muscle Tone: within normal limits Gait & Station: normal Patient leans: N/A  Psychiatric Specialty Exam: See SRA by MD  Physical Exam  Vitals reviewed. Cardiovascular: Normal rate.  Psychiatric: She has a normal mood and affect. Her behavior is normal.    ROS  Blood pressure 134/85, pulse (!) 102, temperature 98.7 F (37.1 C), temperature source Oral, resp. rate 20, height 5\' 1"  (1.549 m), weight 80.7 kg, SpO2 100 %.Body mass index is 33.63 kg/m.    Have you used any form of tobacco in the last 30 days? (Cigarettes, Smokeless Tobacco, Cigars, and/or Pipes): No  Has this patient used any form of tobacco in the last 30 days? (Cigarettes, Smokeless Tobacco, Cigars, and/or Pipes)  No  Blood Alcohol level:  Lab Results  Component Value Date   ETH <10 36/14/4315    Metabolic Disorder Labs:  Lab Results  Component Value Date   HGBA1C 5.8 (A) 11/20/2018   MPG 116.89 06/01/2017   MPG 137 08/04/2014   No results found for: PROLACTIN Lab Results  Component Value Date   CHOL 152 08/05/2014   TRIG 62 08/05/2014   HDL 38 (L) 08/05/2014   CHOLHDL 4.0 08/05/2014   VLDL 12 08/05/2014   LDLCALC 102 (H) 08/05/2014   LDLCALC 123 (H) 04/08/2010    See Psychiatric Specialty Exam and Suicide Risk Assessment  completed by Attending Physician prior to discharge.  Discharge destination:  Home  Is patient on multiple antipsychotic therapies at discharge:  No   Has Patient had three or more failed trials of antipsychotic monotherapy by history:  No  Recommended Plan for Multiple Antipsychotic Therapies: NA  Discharge Instructions    Diet - low sodium heart healthy   Complete by: As directed    Increase activity slowly   Complete by: As directed      Allergies as of 02/04/2019      Reactions   Amoxicillin Rash   PATIENT HAS HAD A PCN REACTION WITH IMMEDIATE RASH, FACIAL/TONGUE/THROAT SWELLING, SOB, OR LIGHTHEADEDNESS WITH HYPOTENSION:  #  #  #  YES  #  #  #   HAS PT DEVELOPED SEVERE RASH INVOLVING MUCUS MEMBRANES or SKIN NECROSIS: #  #  #  YES  #  #  #     "On my face" Has patient had a PCN reaction that required hospitalization: No Has patient had a PCN reaction occurring within the last 10 years:  #  #  #  UNKNOWN  #  #  #  .   Ampicillin Rash   PATIENT HAS HAD A PCN REACTION WITH IMMEDIATE RASH, FACIAL/TONGUE/THROAT SWELLING, SOB, OR LIGHTHEADEDNESS WITH HYPOTENSION: # # # YES # # #  HAS PT DEVELOPED SEVERE RASH INVOLVING MUCUS MEMBRANES or SKIN NECROSIS: # # # YES # # # "On my face"e Has patient had a PCN reaction that required hospitalization: No Has patient had a PCN reaction occurring within the last 10 years:  #  #  #  UNKNOWN  #  #  #    Mobic [meloxicam] Rash   Montelukast Sodium Rash   Phenergan [promethazine Hcl] Nausea And Vomiting      Medication List    STOP taking these medications   acetaminophen 500 MG tablet Commonly known as: TYLENOL   aspirin 81 MG chewable tablet   docusate sodium 100 MG capsule Commonly known as: COLACE   ibuprofen 200 MG tablet Commonly known as: ADVIL   senna 8.6 MG Tabs tablet  Commonly known as: SENOKOT     TAKE these medications     Indication  albuterol (2.5 MG/3ML) 0.083% nebulizer solution Commonly known as: PROVENTIL Take 3  mLs (2.5 mg total) by nebulization every 6 (six) hours as needed for wheezing or shortness of breath.  Indication: Spasm of Lung Air Passages   albuterol 108 (90 Base) MCG/ACT inhaler Commonly known as: VENTOLIN HFA Inhale 2 puffs into the lungs every 6 (six) hours as needed for wheezing or shortness of breath.  Indication: Asthma   amLODipine 10 MG tablet Commonly known as: NORVASC TAKE 1 TABLET BY MOUTH EVERY DAY  Indication: High Blood Pressure Disorder   budesonide-formoterol 80-4.5 MCG/ACT inhaler Commonly known as: Symbicort INHALE TWO PUFFS BY MOUTH INTO LUNGS TWICE DAILY What changed: Another medication with the same name was removed. Continue taking this medication, and follow the directions you see here.  Indication: Asthma   cetirizine 10 MG tablet Commonly known as: ZYRTEC Take 10 mg by mouth daily.  Indication: Allergic Conjunctivitis   cholecalciferol 1000 units tablet Commonly known as: VITAMIN D Take 1 tablet (1,000 Units total) by mouth daily.  Indication: Vitamin D Deficiency   diclofenac sodium 1 % Gel Commonly known as: VOLTAREN Apply 4 g topically 4 (four) times daily. What changed:   when to take this  reasons to take this  Indication: Joint Damage causing Pain and Loss of Function   ferrous sulfate 325 (65 FE) MG tablet Take 1 tablet (325 mg total) by mouth daily.  Indication: Iron Deficiency   fluticasone 50 MCG/ACT nasal spray Commonly known as: FLONASE USE TWO SPRAY(S) IN EACH NOSTRIL ONCE DAILY What changed: See the new instructions.  Indication: Signs and Symptoms of Nose Diseases   ketoconazole 2 % cream Commonly known as: NIZORAL Apply 1 application topically daily. What changed:   when to take this  reasons to take this  Indication: Atopic Dermatitis   traZODone 50 MG tablet Commonly known as: DESYREL Take 1 tablet (50 mg total) by mouth at bedtime and may repeat dose one time if needed.  Indication: Anxiety Disorder,  Trouble Sleeping   vitamin B-12 1000 MCG tablet Commonly known as: CYANOCOBALAMIN Take 1,000 mcg by mouth daily.  Indication: Pernicious Anemia   Vitamin D (Ergocalciferol) 1.25 MG (50000 UT) Caps capsule Commonly known as: DRISDOL Take 1 capsule (50,000 Units total) by mouth every 7 (seven) days.  Indication: Vitamin D Deficiency        Follow-up recommendations:  Activity:  as tolerated Diet:  heart healthy  Comments:  Take all medications as prescribed. Keep all follow-up appointments as scheduled.  Do not consume alcohol or use illegal drugs while on prescription medications. Report any adverse effects from your medications to your primary care provider promptly.  In the event of recurrent symptoms or worsening symptoms, call 911, a crisis hotline, or go to the nearest emergency department for evaluation.   Signed: Derrill Center, NP 02/04/2019, 9:52 AM

## 2019-02-16 DIAGNOSIS — K635 Polyp of colon: Secondary | ICD-10-CM | POA: Diagnosis not present

## 2019-02-16 DIAGNOSIS — K648 Other hemorrhoids: Secondary | ICD-10-CM | POA: Diagnosis not present

## 2019-02-16 DIAGNOSIS — K5730 Diverticulosis of large intestine without perforation or abscess without bleeding: Secondary | ICD-10-CM | POA: Diagnosis not present

## 2019-02-16 DIAGNOSIS — D123 Benign neoplasm of transverse colon: Secondary | ICD-10-CM | POA: Diagnosis not present

## 2019-02-16 DIAGNOSIS — Z1211 Encounter for screening for malignant neoplasm of colon: Secondary | ICD-10-CM | POA: Diagnosis not present

## 2019-02-16 DIAGNOSIS — K621 Rectal polyp: Secondary | ICD-10-CM | POA: Diagnosis not present

## 2019-02-20 DIAGNOSIS — K635 Polyp of colon: Secondary | ICD-10-CM | POA: Diagnosis not present

## 2019-03-13 ENCOUNTER — Ambulatory Visit: Payer: BC Managed Care – PPO

## 2019-03-13 ENCOUNTER — Telehealth: Payer: Self-pay | Admitting: *Deleted

## 2019-03-13 NOTE — Telephone Encounter (Signed)
Call to patient about missed appointment today.  Unable to leave message as this has not been set up.  Sander Nephew, RN 03/13/2019 4:13 PM.

## 2019-03-14 ENCOUNTER — Encounter: Payer: Self-pay | Admitting: Internal Medicine

## 2019-03-14 ENCOUNTER — Other Ambulatory Visit: Payer: Self-pay

## 2019-03-14 ENCOUNTER — Ambulatory Visit (INDEPENDENT_AMBULATORY_CARE_PROVIDER_SITE_OTHER): Payer: BC Managed Care – PPO | Admitting: Internal Medicine

## 2019-03-14 VITALS — BP 152/89 | HR 81 | Temp 98.7°F | Wt 180.8 lb

## 2019-03-14 DIAGNOSIS — B358 Other dermatophytoses: Secondary | ICD-10-CM

## 2019-03-14 DIAGNOSIS — R21 Rash and other nonspecific skin eruption: Secondary | ICD-10-CM

## 2019-03-14 DIAGNOSIS — Z23 Encounter for immunization: Secondary | ICD-10-CM | POA: Diagnosis not present

## 2019-03-14 DIAGNOSIS — Z87891 Personal history of nicotine dependence: Secondary | ICD-10-CM

## 2019-03-14 MED ORDER — TERBINAFINE HCL 250 MG PO TABS: 250.0000 mg | ORAL_TABLET | Freq: Every day | ORAL | 0 refills | Status: AC

## 2019-03-14 NOTE — Assessment & Plan Note (Signed)
Patient reports worsening facial rash that began about 1 year ago.  At that time she was given a topical antifungal she said she used it twice a day daily for about a month did not work.  She then used a over-the-counter [employer] eczema cream which was also unsuccessful.  She feels that it may have been worsened by having to wear a mask and face shields during the coronavirus.  Overall in comparing her picture from today to back then the rash looks fairly similar, at the initial visit it appears that the rash was somewhat erythematous which I do not see evidence of today.  She does report a pruritic nature to the rash and she does scratch it there is some dead skin around it.  It is difficult to say the exact cause of the rash, with this pruritic quality and relatively circumferential lesions of hypopigmentation it could be that this represents a dermatophyte.  It is possible that she just did not respond to the initial therapy.  Given that it is on the face I am hesitant to do any sort of biopsy today.    -I will switch her to terbinafine 250 mg daily x1 week -I will also refer her to dermatology for further evaluation in the event that this does not resolve and to follow-up as I expect it would take some time to get an appointment

## 2019-03-14 NOTE — Progress Notes (Signed)
CC: rash on face  HPI:  [name] is a 53 y.o. female with PMH below.  Today we will address rash on face  Please see A&P for status of the patient's chronic medical conditions  Past Medical History:  Diagnosis Date  . Anemia   . Asthma   . Bursitis of left hip   . Decreased appetite   . GERD (gastroesophageal reflux disease)   . History of hiatal hernia    repaired  . History of stomach ulcers   . Hypertension   . Migraine    "maybe twice in the last 10 yrs" (10/16/2013)  . PONV (postoperative nausea and vomiting)   . Shortness of breath dyspnea    Review of Systems:  ROS: Pulmonary: pt denies increased work of breathing, shortness of breath,  Cardiac: pt denies palpitations, chest pain,  Abdominal: pt denies abdominal pain, nausea, vomiting, or diarrhea   Physical Exam:  Vitals:   03/14/19 1514  BP: (!) 152/89  Pulse: 81  Temp: 98.7 F (37.1 C)  TempSrc: Oral  SpO2: 100%  Weight: 180 lb 12.8 oz (82 kg)   Cardiac: JVD flat, normal rate and rhythm, clear s1 and s2, no murmurs, rubs or gallops, no LE edema Pulmonary: CTAB, not in distress Abdominal: non distended abdomen, soft and nontender Psych: Alert, conversant, in good spirits Skin: localized area of hypopigmentation around nose, eyebrows and cheeks, small area on chin as well.  Not scaly but dry skin noted around borders of several lesions. Media Information    Document Information  Photos    03/14/2019 15:25  Attached To:  Office Visit on 03/14/19 with Katherine Roan, MD  Source Information  Faron Whitelock, Jenne Pane, MD  [facility]     Social History   Socioeconomic History  . Marital status: Single    Spouse name: Not on file  . Number of children: Not on file  . Years of education: Not on file  . Highest education level: Not on file  Occupational History  . Occupation: residential worker  Social Needs  . Financial resource strain: Not on file  . Food insecurity   Worry: Not on file    Inability: Not on file  . Transportation needs    Medical: Not on file    Non-medical: Not on file  Tobacco Use  . Smoking status: Former Smoker    Years: 1.00    Types: Cigarettes    Quit date: 06/01/2014    Years since quitting: 4.7  . Smokeless tobacco: Never Used  Substance and Sexual Activity  . Alcohol use: Yes    Alcohol/week: 0.0 standard drinks    Comment: socially  . Drug use: No  . Sexual activity: Yes  Lifestyle  . Physical activity    Days per week: Not on file    Minutes per session: Not on file  . Stress: Not on file  Relationships  . Social Herbalist on phone: Not on file    Gets together: Not on file    Attends religious service: Not on file    Active member of club or organization: Not on file    Attends meetings of clubs or organizations: Not on file    Relationship status: Not on file  . Intimate partner violence    Fear of current or ex partner: Not on file    Emotionally abused: Not on file    Physically abused: Not on file  Forced sexual activity: Not on file  Other Topics Concern  . Not on file  Social History Narrative  . Not on file    Family History  Problem Relation Age of Onset  . Cancer Mother 60       unknown  . Heart attack Father 91  . Heart attack Sister 74  . Diabetes Sister     Assessment & Plan:   See Encounters Tab for problem based charting.  Patient discussed with Dr. Philipp Ovens

## 2019-03-14 NOTE — Patient Instructions (Addendum)
[name], we will start you on a different antifungal medicine that you take in tablet form.  In the mean time we have placed a referral to a dermatologist for you to see and follow up on this rash.

## 2019-03-15 ENCOUNTER — Telehealth: Payer: Self-pay | Admitting: Internal Medicine

## 2019-03-15 NOTE — Telephone Encounter (Signed)
Cannot find who called pt, spoke w/ lela no calls from her, I see no note that pt was called

## 2019-03-15 NOTE — Progress Notes (Signed)
[facility] Medicine Clinic Attending  Case discussed with Dr. Winfrey  at the time of the visit.  We reviewed the resident's history and exam and pertinent patient test results.  I agree with the assessment, diagnosis, and plan of care documented in the resident's note.  

## 2019-03-15 NOTE — Telephone Encounter (Signed)
Pt missed a called; pls contact [phone]

## 2019-04-23 DIAGNOSIS — L309 Dermatitis, unspecified: Secondary | ICD-10-CM | POA: Diagnosis not present

## 2019-08-11 DIAGNOSIS — U071 COVID-19: Secondary | ICD-10-CM | POA: Diagnosis not present

## 2019-08-13 ENCOUNTER — Ambulatory Visit: Payer: Self-pay

## 2019-09-20 ENCOUNTER — Emergency Department (HOSPITAL_BASED_OUTPATIENT_CLINIC_OR_DEPARTMENT_OTHER): Payer: BC Managed Care – PPO

## 2019-09-20 ENCOUNTER — Other Ambulatory Visit: Payer: Self-pay

## 2019-09-20 ENCOUNTER — Encounter (HOSPITAL_COMMUNITY): Payer: Self-pay

## 2019-09-20 ENCOUNTER — Emergency Department (HOSPITAL_COMMUNITY)
Admission: EM | Admit: 2019-09-20 | Discharge: 2019-09-20 | Disposition: A | Discharge status: Home / Self Care | Payer: BC Managed Care – PPO | Attending: Emergency Medicine | Admitting: Emergency Medicine

## 2019-09-20 DIAGNOSIS — Z79899 Other long term (current) drug therapy: Secondary | ICD-10-CM | POA: Diagnosis not present

## 2019-09-20 DIAGNOSIS — M79609 Pain in unspecified limb: Secondary | ICD-10-CM

## 2019-09-20 DIAGNOSIS — R2242 Localized swelling, mass and lump, left lower limb: Secondary | ICD-10-CM | POA: Diagnosis not present

## 2019-09-20 DIAGNOSIS — J45909 Unspecified asthma, uncomplicated: Secondary | ICD-10-CM | POA: Insufficient documentation

## 2019-09-20 DIAGNOSIS — Z96641 Presence of right artificial hip joint: Secondary | ICD-10-CM | POA: Diagnosis not present

## 2019-09-20 DIAGNOSIS — Z87891 Personal history of nicotine dependence: Secondary | ICD-10-CM | POA: Insufficient documentation

## 2019-09-20 DIAGNOSIS — I10 Essential (primary) hypertension: Secondary | ICD-10-CM | POA: Diagnosis not present

## 2019-09-20 DIAGNOSIS — M79605 Pain in left leg: Secondary | ICD-10-CM | POA: Insufficient documentation

## 2019-09-20 DIAGNOSIS — M7989 Other specified soft tissue disorders: Secondary | ICD-10-CM

## 2019-09-20 MED ORDER — PREDNISONE 10 MG PO TABS: 20.0000 mg | ORAL_TABLET | Freq: Two times a day (BID) | ORAL | 0 refills | Status: AC

## 2019-09-20 MED ORDER — CYCLOBENZAPRINE HCL 10 MG PO TABS: 10.0000 mg | ORAL_TABLET | Freq: Two times a day (BID) | ORAL | 0 refills | Status: DC | PRN

## 2019-09-20 MED ORDER — ACETAMINOPHEN 500 MG PO TABS: 500.0000 mg | ORAL_TABLET | Freq: Four times a day (QID) | ORAL | 0 refills | Status: DC | PRN

## 2019-09-20 MED ORDER — PREDNISONE 20 MG PO TABS
60.0000 mg | ORAL_TABLET | Freq: Once | ORAL | Status: AC
  Administered 2019-09-20: 60 mg via ORAL
  Filled 2019-09-20: qty 3

## 2019-09-20 NOTE — ED Triage Notes (Signed)
Patient reports that she began having a burning pain in her left thigh that radiates down the leg x 3 days. Swelling noted to the left thigh and knee area.

## 2019-09-20 NOTE — Progress Notes (Signed)
VASCULAR LAB PRELIMINARY  PRELIMINARY  PRELIMINARY  PRELIMINARY  Left lower extremity venous duplex completed.    Preliminary report:  See CV proc for preliminary results.  [address], PA-C report   Marena Witts, RVT 09/20/2019, 12:48 PM

## 2019-09-20 NOTE — Discharge Instructions (Signed)
1. Medications: Take steroid as prescribed with food to avoid upset stomach issues.  Do not take ibuprofen, Advil, Aleve, or Motrin while taking this medicine.  You may take [phone] mg of Tylenol every 6 hours as needed for pain. Do not exceed 4000 mg of Tylenol daily.  You can take Flexeril as needed for muscle relaxation but this medication may make you drowsy so do not drive, drink alcohol, operate heavy machinery, or make important decisions while you are using this medicine.  I typically recommend only taking this medicine at night.  You can also cut these tablets in half if they are very strong. 2. Treatment: rest, ice, elevate and use brace, drink plenty of fluids, gentle stretching (see attached exercises, but you can also YouTube search physical therapy exercises).   3. Follow Up: Please followup with orthopedics as directed or your PCP in 1 week if no improvement for discussion of your diagnoses and further evaluation after today's visit; if you do not have a primary care doctor use the resource guide provided to find one; Please return to the ER for worsening symptoms or other concerns such as worsening swelling, redness of the skin, fevers, loss of pulses, or loss of feeling

## 2019-09-20 NOTE — ED Provider Notes (Signed)
[facility] DEPT Provider Note   CSN: [id number] Arrival date & time: 09/20/19  1145     History Chief Complaint  Patient presents with  . Leg Pain  . Leg Swelling    [name] is a 54 y.o. female with history of GERD, hypertension, migraine headaches presents for evaluation of acute onset, persistent left lower extremity pain for 3 days.  Denies any known trauma or falls.  The pain is burning, aching, worse along the medial aspect of the left thigh but yesterday began to radiate down into the left lower leg.  Notes tingling of the extremity but no numbness or weakness.  Denies bowel or bladder incontinence, saddle anesthesia, fevers, or history of IV drug use.  She has been taking ibuprofen with some relief.  She reports that she is here because she is concerned that she could have a blood clot and would like an ultrasound to evaluate for this.  She denies any recent travel or surgeries, hemoptysis, chest pain, shortness of breath, prior history of DVT or PE.  She does think she has had a little bit of left leg swelling denies color change.  The history is provided by the patient.       Past Medical History:  Diagnosis Date  . Anemia   . Asthma   . Bursitis of left hip   . Decreased appetite   . GERD (gastroesophageal reflux disease)   . History of hiatal hernia    repaired  . History of stomach ulcers   . Hypertension   . Migraine    "maybe twice in the last 10 yrs" (10/16/2013)  . PONV (postoperative nausea and vomiting)   . Shortness of breath dyspnea     Patient Active Problem List   Diagnosis Date Noted  . Adjustment disorder with disturbance of conduct   . MDD (major depressive disorder), severe ([place]) 02/03/2019  . Radiculopathy affecting upper extremity 01/17/2019  . Asthma, not well controlled, mild intermittent, with acute exacerbation 02/20/2018  . Facial rash 02/20/2018  . Osteoarthritis of right hip 12/23/2016  . GERD  (gastroesophageal reflux disease) 03/08/2015  . Healthcare maintenance 03/08/2015  . History of cholecystectomy 03/08/2015  . Prediabetes 08/05/2014  . Hyperlipidemia 08/05/2014  . Hypertension 08/04/2014  . Iron deficiency anemia 08/04/2014  . Obesity 08/04/2014  . History of diverticulitis 10/16/2013    Past Surgical History:  Procedure Laterality Date  . BARTHOLIN GLAND CYST EXCISION    . BREAST LUMPECTOMY WITH RADIOACTIVE SEED LOCALIZATION Left 01/23/2016   Procedure: BREAST LUMPECTOMY WITH RADIOACTIVE SEED LOCALIZATION;  Surgeon: Jackolyn Confer, MD;  Location: [facility];  Service: General;  Laterality: Left;  . CESAREAN SECTION  1989  . CHOLECYSTECTOMY  ~ 2000  . EPIGASTRIC HERNIA REPAIR  07/2003  . HERNIA REPAIR    . INCISION AND DRAINAGE ABSCESS  10/2005   Bartholin abscess.  . TONSILLECTOMY AND ADENOIDECTOMY  1983  . 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 TOTAL HIP ARTHROPLASTY Right 06/06/2017   Procedure: RIGHT TOTAL HIP ARTHROPLASTY ANTERIOR APPROACH;  Surgeon: Rod Can, MD;  Location: [facility];  Service: Orthopedics;  Laterality: Right;  Needs RNFA  . TUBAL LIGATION  1994     OB History   No obstetric history on file.     Family History  Problem Relation Age of Onset  . Cancer Mother 45       unknown  . Heart attack Father 59  . Heart attack Sister 18  .  Diabetes Sister     Social History   Tobacco Use  . Smoking status: Former Smoker    Years: 1.00    Types: Cigarettes    Quit date: 06/01/2014    Years since quitting: 5.3  . Smokeless tobacco: Never Used  Substance Use Topics  . Alcohol use: Yes    Alcohol/week: 0.0 standard drinks    Comment: socially  . Drug use: No    Home Medications Prior to Admission medications   Medication Sig Start Date End Date Taking? Authorizing Provider  acetaminophen (TYLENOL) 500 MG tablet Take 1 tablet (500 mg total) by mouth every 6 (six) hours as needed. 09/20/19   Eutimio Gharibian A, PA-C  albuterol (PROVENTIL  HFA;VENTOLIN HFA) 108 (90 Base) MCG/ACT inhaler Inhale 2 puffs into the lungs every 6 (six) hours as needed for wheezing or shortness of breath. 08/21/18   Lorella Nimrod, MD  albuterol (PROVENTIL) (2.5 MG/3ML) 0.083% nebulizer solution Take 3 mLs (2.5 mg total) by nebulization every 6 (six) hours as needed for wheezing or shortness of breath. 09/29/17   Lorella Nimrod, MD  amLODipine (NORVASC) 10 MG tablet TAKE 1 TABLET BY MOUTH EVERY DAY Patient taking differently: Take 10 mg by mouth daily.  11/06/18   Aldine Contes, MD  budesonide-formoterol (SYMBICORT) 80-4.5 MCG/ACT inhaler INHALE TWO PUFFS BY MOUTH INTO LUNGS TWICE DAILY Patient not taking: Reported on 02/02/2019 08/21/18   Lorella Nimrod, MD  cetirizine (ZYRTEC) 10 MG tablet Take 10 mg by mouth daily.    [provider]  cholecalciferol (VITAMIN D) 1000 units tablet Take 1 tablet (1,000 Units total) by mouth daily. 11/18/16   Alphonzo Grieve, MD  cyclobenzaprine (FLEXERIL) 10 MG tablet Take 1 tablet (10 mg total) by mouth 2 (two) times daily as needed for muscle spasms. 09/20/19   Edon Hoadley A, PA-C  diclofenac sodium (VOLTAREN) 1 % GEL Apply 4 g topically 4 (four) times daily. Patient taking differently: Apply 4 g topically 4 (four) times daily as needed (pain).  06/08/17   Swinteck, Aaron Edelman, MD  ferrous sulfate 325 (65 FE) MG tablet Take 1 tablet (325 mg total) by mouth daily. Patient not taking: Reported on 02/02/2019 08/11/16 08/11/17  Ledell Noss, MD  fluticasone [facility]) 50 MCG/ACT nasal spray USE TWO SPRAY(S) IN EACH NOSTRIL ONCE DAILY Patient taking differently: Place 2 sprays into both nostrils daily as needed for allergies or rhinitis.  08/05/16   Alphonzo Grieve, MD  predniSONE (DELTASONE) 10 MG tablet Take 2 tablets (20 mg total) by mouth 2 (two) times daily with a meal for 5 days. 09/20/19 09/25/19  Rodell Perna A, PA-C  traZODone (DESYREL) 50 MG tablet Take 1 tablet (50 mg total) by mouth at bedtime and may repeat dose one time if needed.  02/04/19   Derrill Center, NP  vitamin B-12 (CYANOCOBALAMIN) 1000 MCG tablet Take 1,000 mcg by mouth daily.    [provider]  Vitamin D, Ergocalciferol, (DRISDOL) 1.25 MG (50000 UT) CAPS capsule Take 1 capsule (50,000 Units total) by mouth every 7 (seven) days. Patient not taking: Reported on 02/02/2019 11/21/18   Lorella Nimrod, MD    Allergies    Amoxicillin, Ampicillin, Mobic [meloxicam], Montelukast sodium, and Phenergan [promethazine hcl]  Review of Systems   Review of Systems  Constitutional: Negative for chills and fever.  Respiratory: Negative for shortness of breath.   Cardiovascular: Positive for leg swelling. Negative for chest pain.  Gastrointestinal: Negative for abdominal pain.  Musculoskeletal: Positive for arthralgias.  Neurological: Negative  for weakness and numbness.  All other systems reviewed and are negative.   Physical Exam Updated Vital Signs BP (!) 136/111 (BP Location: Right Arm)   Pulse 94   Temp 99.4 F (37.4 C) (Oral)   Resp 16   Ht 5\' 1"  (1.549 m)   Wt 79.4 kg   LMP 09/03/2019   SpO2 100%   BMI 33.07 kg/m   Physical Exam Vitals and nursing note reviewed.  Constitutional:      General: She is not in acute distress.    Appearance: She is well-developed.  HENT:     Head: Normocephalic and atraumatic.  Eyes:     General:        Right eye: No discharge.        Left eye: No discharge.     Conjunctiva/sclera: Conjunctivae normal.  Neck:     Vascular: No JVD.     Trachea: No tracheal deviation.  Cardiovascular:     Rate and Rhythm: Normal rate.     Pulses: Normal pulses.     Comments: 2+ radial and DP/PT pulses bilaterally, Homans sign present on the left, no lower extremity edema, no palpable cords, compartments are soft  Pulmonary:     Effort: Pulmonary effort is normal.  Abdominal:     General: There is no distension.  Musculoskeletal:        General: Tenderness present.     Comments: No midline lumbar spine tenderness, left  SI joint tenderness noted.  She also has tenderness to palpation of the posterior and lateral aspect of the left hip with no erythema or warmth.  Normal active and passive range of motion with pain elicited with flexion and external rotation.  5/5 strength of BLE major muscle groups.  Skin:    General: Skin is warm and dry.     Findings: No erythema.  Neurological:     Mental Status: She is alert.     Comments: Sensation intact to light touch of bilateral lower extremities.  Psychiatric:        Behavior: Behavior normal.     ED Results / Procedures / Treatments   Labs (all labs ordered are listed, but only abnormal results are displayed) Labs Reviewed - No data to display  EKG None  Radiology VAS [place] LOWER EXTREMITY VENOUS (DVT) ([facility] and [facility] 7a-7p)  Result Date: 09/20/2019  Lower Venous DVTStudy Indications: Thigh pain and swelling.  Comparison Study: Prior study from 05/25/11 is available for comparison. Performing Technologist: Sharion Dove RVS  Examination Guidelines: A complete evaluation includes B-mode imaging, spectral Doppler, color Doppler, and power Doppler as needed of all accessible portions of each vessel. Bilateral testing is considered an integral part of a complete examination. Limited examinations for reoccurring indications may be performed as noted. The reflux portion of the exam is performed with the patient in reverse Trendelenburg.  +-----+---------------+---------+-----------+----------+--------------+ RIGHTCompressibilityPhasicitySpontaneityPropertiesThrombus Aging +-----+---------------+---------+-----------+----------+--------------+ CFV  Full           Yes      Yes                                 +-----+---------------+---------+-----------+----------+--------------+   +---------+---------------+---------+-----------+----------+--------------+ LEFT     CompressibilityPhasicitySpontaneityPropertiesThrombus Aging  +---------+---------------+---------+-----------+----------+--------------+ CFV      Full           Yes      Yes                                 +---------+---------------+---------+-----------+----------+--------------+  SFJ      Full                                                        +---------+---------------+---------+-----------+----------+--------------+ FV Prox  Full                                                        +---------+---------------+---------+-----------+----------+--------------+ FV Mid   Full                                                        +---------+---------------+---------+-----------+----------+--------------+ FV DistalFull                                                        +---------+---------------+---------+-----------+----------+--------------+ PFV      Full                                                        +---------+---------------+---------+-----------+----------+--------------+ POP      Full           Yes      Yes                                 +---------+---------------+---------+-----------+----------+--------------+ PTV      Full                                                        +---------+---------------+---------+-----------+----------+--------------+ PERO     Full                                                        +---------+---------------+---------+-----------+----------+--------------+     Summary: RIGHT: - No evidence of common femoral vein obstruction.  LEFT: - Findings appear essentially unchanged compared to previous examination. - There is no evidence of deep vein thrombosis in the lower extremity.  *See table(s) above for measurements and observations.    Preliminary     Procedures Procedures (including critical care time)  Medications Ordered in ED Medications  predniSONE (DELTASONE) tablet 60 mg (has no administration in time range)    ED Course  I have reviewed  the triage vital signs and the nursing notes.  Pertinent labs & imaging results that were available during my care of the patient were reviewed  by me and considered in my medical decision making (see chart for details).    MDM Rules/Calculators/A&P                      Patient presenting for evaluation of left lower extremity pain.  She is afebrile, vital signs are stable.  She is nontoxic in appearance.  She is neurovascularly intact.  No midline spine tenderness or red flag signs concerning for cauda equina or spinal abscess.  She has good range of motion of the hip joint and I doubt septic arthritis.  She is concerned for DVT so a DVT study was obtained which was negative.  Doubt underlying fracture, osteomyelitis, gout.  No evidence of secondary skin infection.  I suspect she is likely having a flare of musculoskeletal pain.  We discussed conservative therapy and management with Tylenol, steroid burst, Flexeril for muscle relaxation as well as heat and gentle stretching.  Advised of appropriate use of these medications.  Recommend follow-up with her orthopedist or PCP for reevaluation of symptoms.  Discussed strict ED return precautions. Patient verbalized understanding of and agreement with plan and is safe for discharge home at this time.   Final Clinical Impression(s) / ED Diagnoses Final diagnoses:  Acute pain of left lower extremity    Rx / DC Orders ED Discharge Orders         Ordered    acetaminophen (TYLENOL) 500 MG tablet  Every 6 hours PRN     09/20/19 1348    predniSONE (DELTASONE) 10 MG tablet  2 times daily with meals     09/20/19 1348    cyclobenzaprine (FLEXERIL) 10 MG tablet  2 times daily PRN     09/20/19 1348           Renita Papa, PA-C 09/20/19 1353    Milton Ferguson, MD 09/20/19 1507

## 2019-10-08 ENCOUNTER — Telehealth: Payer: Self-pay

## 2019-10-08 NOTE — Telephone Encounter (Signed)
RTC to patient, she states she needs to make an appt for a TB skin test.  Instructed pt to come in tomorrow at 1:00pm on nurse schedule to have test placed, she was agreeable.  Also informed pt test must be read 48-72 hours afterwards (Thursday afternoon - Friday morning) and nurse who places test tomorrow will give her exact time frame to come back for reading, she states this was fine.  Pt also states her new employer told her she needed a 2 step TB skin test.  SChaplin, RN,BSN

## 2019-10-08 NOTE — Telephone Encounter (Signed)
Requesting to speak with a nurse about injection. Please call pt back.

## 2019-10-09 ENCOUNTER — Ambulatory Visit (INDEPENDENT_AMBULATORY_CARE_PROVIDER_SITE_OTHER): Payer: BC Managed Care – PPO | Admitting: *Deleted

## 2019-10-09 DIAGNOSIS — Z111 Encounter for screening for respiratory tuberculosis: Secondary | ICD-10-CM

## 2019-10-09 NOTE — Progress Notes (Signed)
   Patient presents for PPD placement for work Denies previous positive TB test  Denies known exposure to TB   Tuberculin skin test applied to left ventral forearm.  Patient aware that she needs to return in 48-72 hours for PPD reading  L. Samanta Gal, BSN, RN-BC

## 2019-10-10 ENCOUNTER — Other Ambulatory Visit: Payer: Self-pay | Admitting: Internal Medicine

## 2019-10-11 ENCOUNTER — Ambulatory Visit: Payer: BC Managed Care – PPO

## 2019-10-11 LAB — TB SKIN TEST
Induration: >0 mm
TB Skin Test: NEGATIVE

## 2019-10-26 IMAGING — DX DG PORTABLE PELVIS
1 series · 1 of 1 positions shown · non-contrast
Comparison: MRI of the RIGHT hip January 26, 2017

CLINICAL DATA: RIGHT hip arthroplasty.

EXAM:
PORTABLE PELVIS 1-2 VIEWS

[pelvis ap]
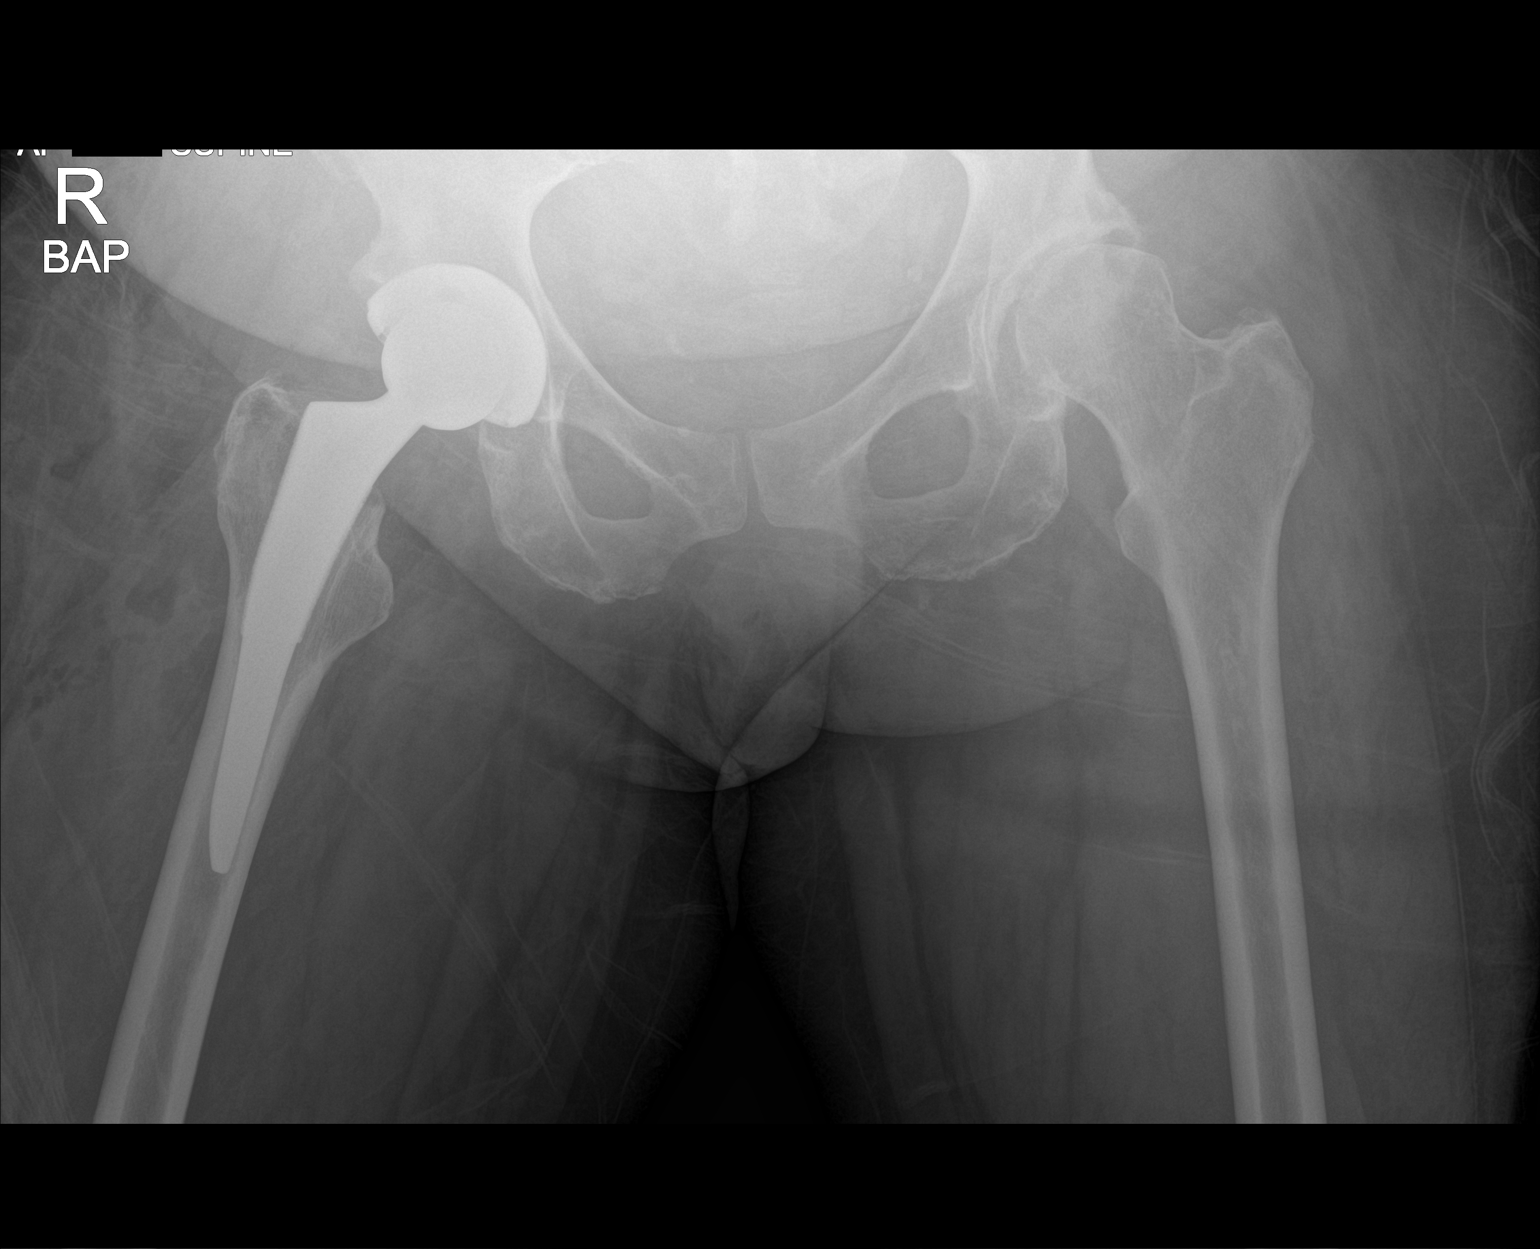

[1 of 1 positions shown; findings below may reference images not displayed]

FINDINGS: Status post recent RIGHT hip total arthroplasty with intact
well-seated femoral and acetabular non sub cemented hardware. No
fracture deformity. No dislocation. Shallow LEFT acetabula with mild
osteoarthrosis. RIGHT hip soft tissue gas consistent with recent
surgery.
IMPRESSION: Status post RIGHT hip total arthroplasty with expected postoperative
change.

## 2019-10-26 IMAGING — RF DG HIP (WITH PELVIS) OPERATIVE*R*
1 series · 5 of 5 positions shown · non-contrast
Comparison: None

FLUOROSCOPY TIME:  18 seconds

CLINICAL DATA: Right hip arthroplasty.

EXAM:
DG C-ARM 61-120 MIN; OPERATIVE RIGHT HIP WITH PELVIS

[Series 1: run · 5 of 5 slices shown]
[im 1/5]
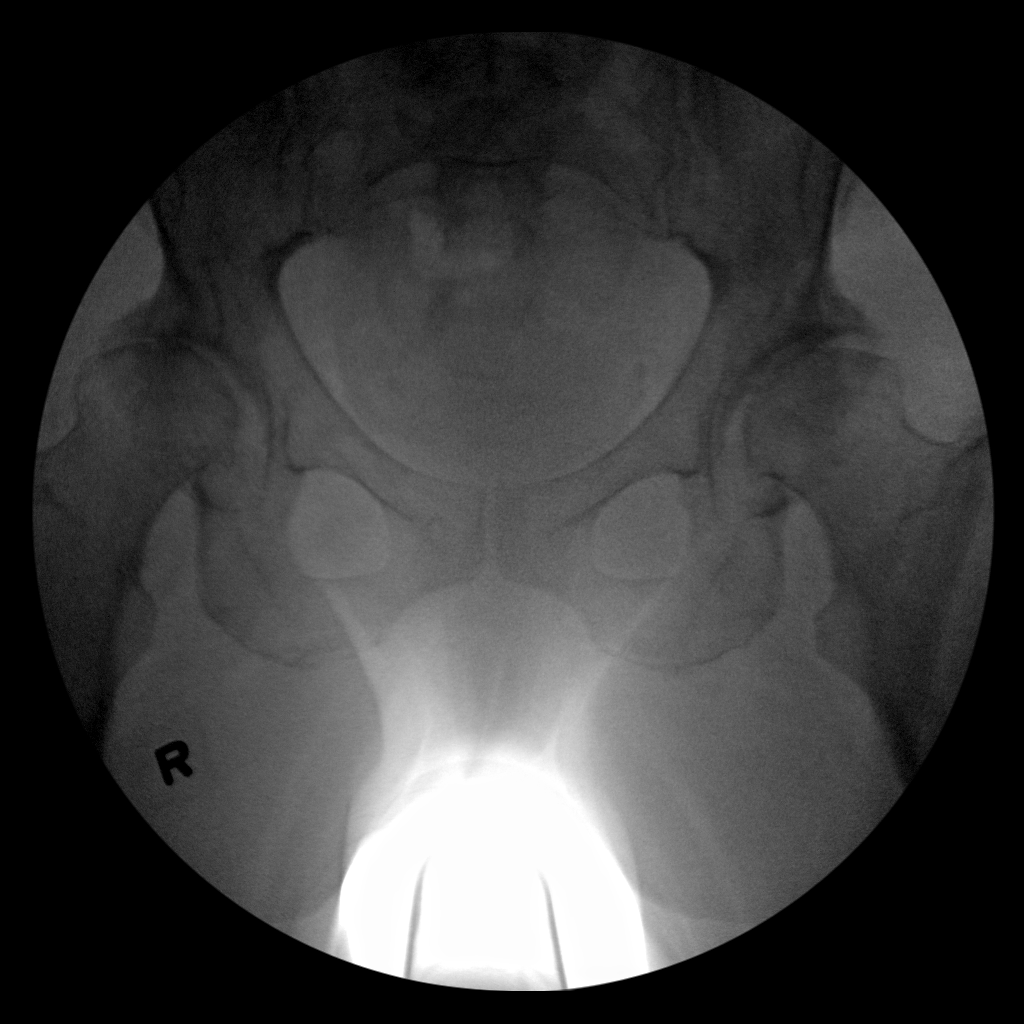
[im 2/5]
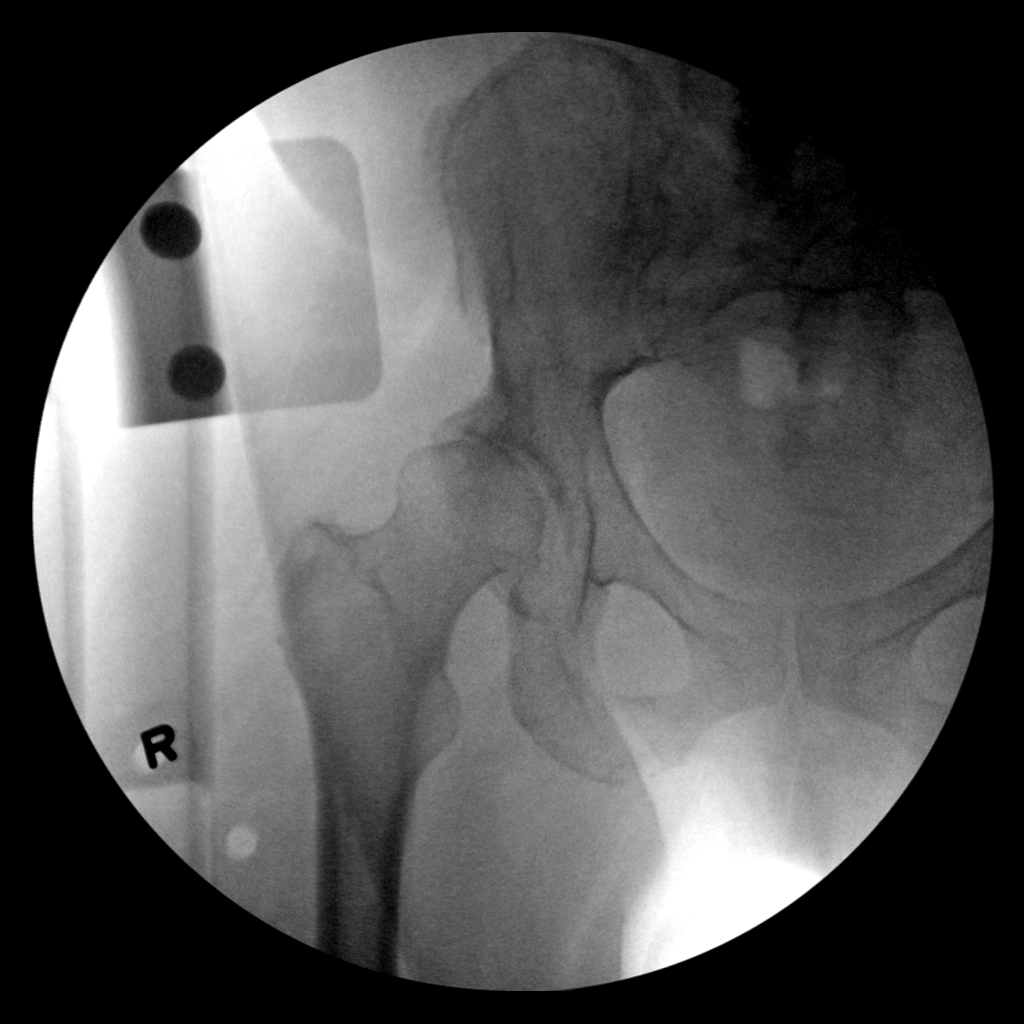
[im 3/5]
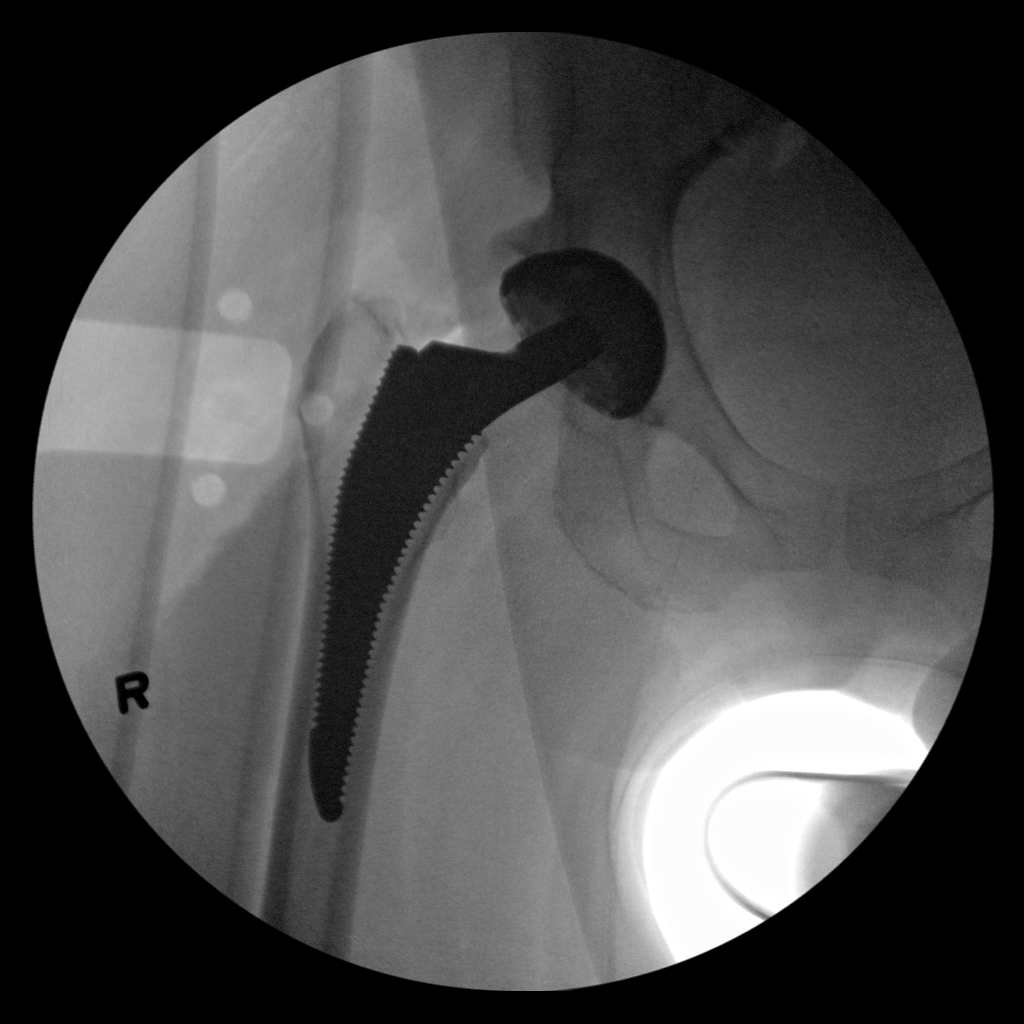
[im 4/5]
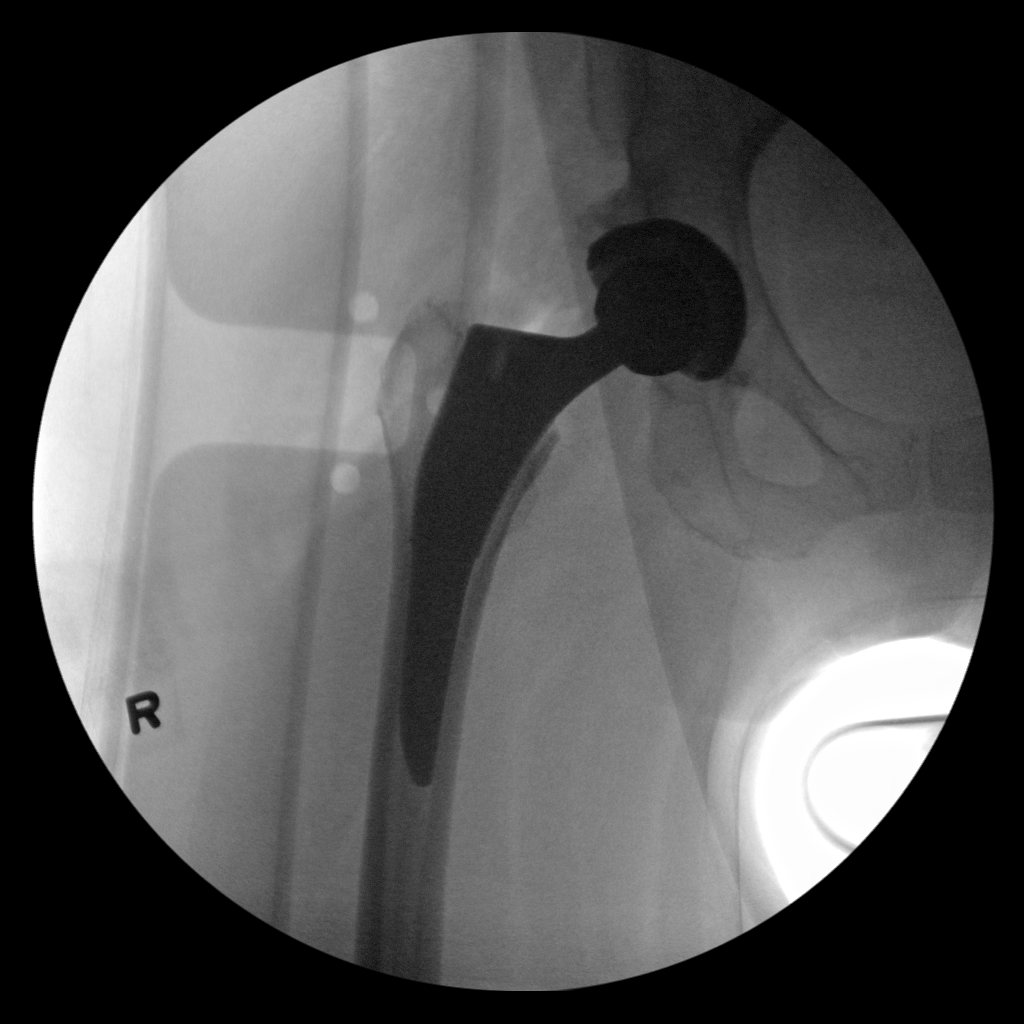
[im 5/5]
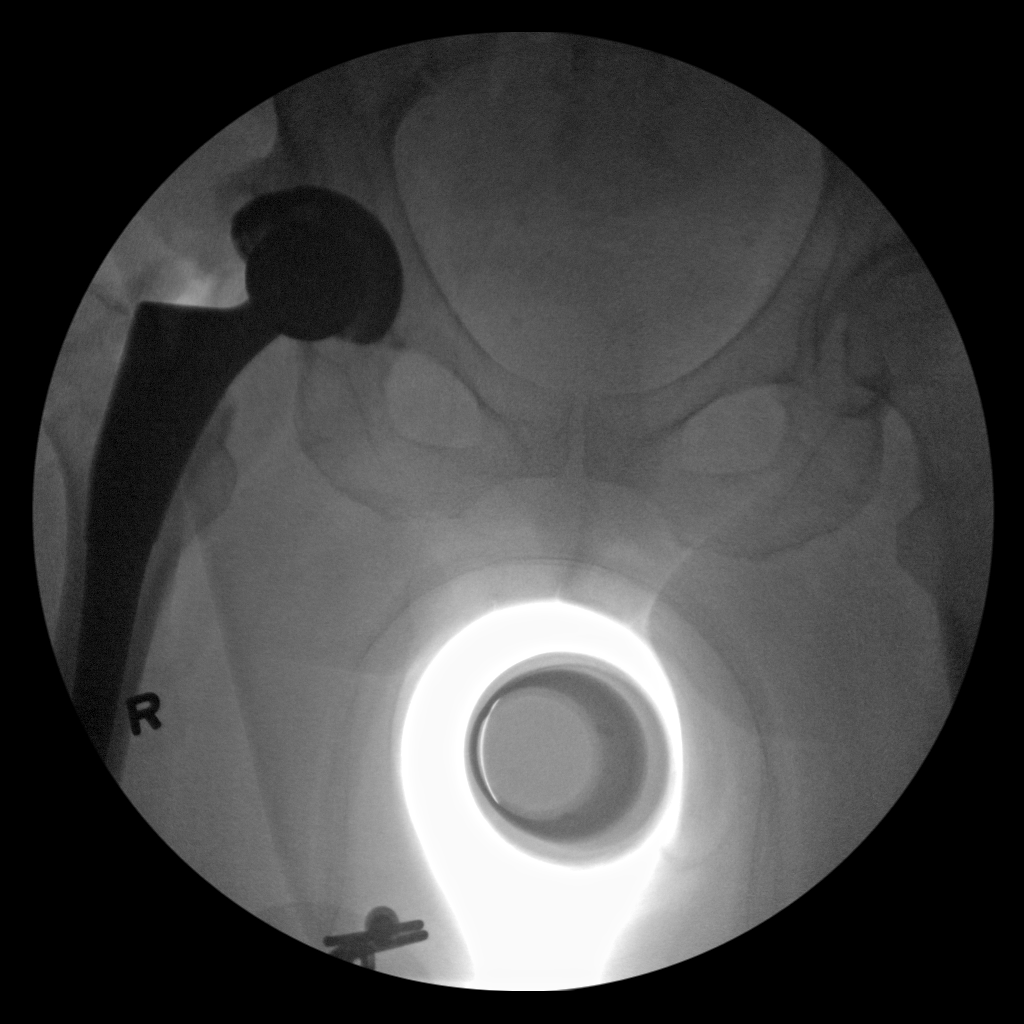

[5 of 5 positions shown; findings below may reference images not displayed]

FINDINGS: Right total hip arthroplasty without failure or complication. Mild
osteoarthritis of the left hip.
IMPRESSION: Interval right total hip arthroplasty.

## 2019-11-12 ENCOUNTER — Encounter: Payer: Self-pay | Admitting: *Deleted

## 2019-12-17 ENCOUNTER — Ambulatory Visit (INDEPENDENT_AMBULATORY_CARE_PROVIDER_SITE_OTHER): Payer: Self-pay | Admitting: Internal Medicine

## 2019-12-17 ENCOUNTER — Encounter: Payer: Self-pay | Admitting: Internal Medicine

## 2019-12-17 VITALS — BP 147/82 | HR 86 | Temp 98.5°F | Ht 61.0 in | Wt 176.4 lb

## 2019-12-17 DIAGNOSIS — B9689 Other specified bacterial agents as the cause of diseases classified elsewhere: Secondary | ICD-10-CM

## 2019-12-17 DIAGNOSIS — J4521 Mild intermittent asthma with (acute) exacerbation: Secondary | ICD-10-CM

## 2019-12-17 DIAGNOSIS — J069 Acute upper respiratory infection, unspecified: Secondary | ICD-10-CM

## 2019-12-17 MED ORDER — ALBUTEROL SULFATE (2.5 MG/3ML) 0.083% IN NEBU: 2.5000 mg | INHALATION_SOLUTION | Freq: Four times a day (QID) | RESPIRATORY_TRACT | 2 refills | Status: AC | PRN

## 2019-12-17 MED ORDER — DOXYCYCLINE HYCLATE 100 MG PO TABS: 100.0000 mg | ORAL_TABLET | Freq: Two times a day (BID) | ORAL | 0 refills | Status: DC

## 2019-12-17 NOTE — Progress Notes (Signed)
   CC: Congestion  HPI:  [name] is a 54 y.o. female, with past medical history of HTN, asthma, GERD, HLD, obesity, MDD, right hip osteoarthritis, presented to clinic today because she is concerned she has sinus infection.  Please refer to problem based charting for further details and assessment and plan.   Past Medical History:  Diagnosis Date  . Anemia   . Asthma   . Bursitis of left hip   . Decreased appetite   . GERD (gastroesophageal reflux disease)   . History of hiatal hernia    repaired  . History of stomach ulcers   . Hypertension   . Migraine    "maybe twice in the last 10 yrs" (10/16/2013)  . PONV (postoperative nausea and vomiting)   . Shortness of breath dyspnea    Review of Systems:   Constitutional: Negative for chills and fever.   ENT: Positive for positive for congestion, positive for PND. Respiratory: Negative for shortness of breath.   Cardiovascular: Negative for chest pain and leg swelling.  Gastrointestinal: Negative for abdominal pain, nausea and vomiting.  Neurological: Negative for dizziness and headaches.   Physical Exam:  Vitals:   12/17/19 1538  BP: (!) 147/82  Pulse: 86  Temp: 98.5 F (36.9 C)  TempSrc: Oral  SpO2: 100%  Weight: 176 lb 6.4 oz (80 kg)  Height: 5\' 1"  (1.549 m)   P/E:   Constitutional: Well-developed and well-nourished. No acute distress.  HENT: 2 Tender mobile neck LAP. Tenderness on maxillary lymph nodes. Patient is not able to cooperate for doing throat exam due to gag reflex and cannot open his mouth completely. Cardiovascular:  RRR, nl S1S2, no murmur,  no LEE Respiratory: Effort normal and breath sounds normal. No respiratory distress. No wheezes.  GI: Soft. No distension. There is no tenderness.  Neurological: Is alert and oriented x 3  Skin: Not diaphoretic. No erythema.  Psychiatric:  Normal mood and affect. Behavior is normal. Judgment and thought content normal.   Assessment & Plan:   See  Encounters Tab for problem based charting.  Patient discussed with Dr. Philipp Ovens

## 2019-12-17 NOTE — Assessment & Plan Note (Signed)
Sent refill for albuterol nebulizer solution.

## 2019-12-17 NOTE — Patient Instructions (Addendum)
Thank you for allowing [place] to provide your care today. You presented with congestion, sore throat and cough for past 12 days. Your symptoms and physical exam are suggestive of bacterial infection. I start you on antibiotic.   Please pick your proscription for Doxycycline from the pharmacy and take 1 tablet of Doxycyline 100 mg every 12 hours. Stay in upright position for at least 20 minutes after taking this medicine. Finish the 5 days course. You can continue taking over the counter cold medicine.  Please follow-up in clinic if no improvement in 10 days.  I also sent a refill for your nebulizer solution.  Today we made no changes to rest of your medications. Please take them as before.  Should you have any questions or concerns please call the internal medicine clinic at [phone].    Thank you

## 2019-12-17 NOTE — Assessment & Plan Note (Addendum)
Patient presented with 12 days history of sore throat, congestion, facial pain, PND. No fever or chills.  She took some over-the-counter cold medicine with mild improvement. No sick contacts. She has some painful lymphadenopathy on her neck, also facial tenderness at maxillary sinuses. Her signs and symptoms are suggestive of sinusitis/pharyngitis.  Given her symptoms lasted more than 10 days, we will treat her as bacterial infection.  She is not vaccinated for COVID-19.  However, her symptoms are not suggestive of viral infection.  But if no improvement, will consider changing her viral etiology.  -Doxycycline 100 mg twice daily x 5 days. (Patient has history of allergic reaction to ampicillin and amoxicillin) -Can take over-the-counter cold medicine (DayQuil) -Encouraged to drink water and warm liquids -Follow-up in clinic if no improvement in 10 days

## 2019-12-18 NOTE — Progress Notes (Signed)
Internal Medicine Clinic Attending  Case discussed with Dr. Masoudi  at the time of the visit.  We reviewed the resident's history and exam and pertinent patient test results.  I agree with the assessment, diagnosis, and plan of care documented in the resident's note.  

## 2019-12-31 ENCOUNTER — Encounter: Payer: Self-pay | Admitting: Student

## 2020-01-28 ENCOUNTER — Ambulatory Visit (INDEPENDENT_AMBULATORY_CARE_PROVIDER_SITE_OTHER): Payer: Self-pay | Admitting: Student

## 2020-01-28 ENCOUNTER — Encounter: Payer: Self-pay | Admitting: Student

## 2020-01-28 VITALS — BP 126/62 | HR 94 | Temp 98.8°F | Ht 61.0 in | Wt 177.9 lb

## 2020-01-28 DIAGNOSIS — I10 Essential (primary) hypertension: Secondary | ICD-10-CM

## 2020-01-28 DIAGNOSIS — B360 Pityriasis versicolor: Secondary | ICD-10-CM

## 2020-01-28 DIAGNOSIS — R21 Rash and other nonspecific skin eruption: Secondary | ICD-10-CM

## 2020-01-28 MED ORDER — TERBINAFINE HCL 1 % EX CREA: 1.0000 "application " | TOPICAL_CREAM | Freq: Two times a day (BID) | CUTANEOUS | 3 refills | Status: AC

## 2020-01-28 NOTE — Patient Instructions (Signed)
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  I have prescribed terbinafine 1% cream. Please apply this twice daily for at least 2 weeks. I have given 2 refills in case you require more.   I will submit a referral to dermatology and you should be getting a phone call to schedule.   Please don't hesitate to call with questions or concerns. [phone]  Thank you,  Dr. Jeralyn Bennett   Tinea Versicolor  Tinea versicolor is a skin infection. It is caused by a type of yeast. It is normal for some yeast to be on your skin, but too much yeast causes this infection. The infection causes a rash of light or dark patches on your skin. The rash is most common on the chest, back, neck, or upper arms. The infection usually does not cause other problems. If it is treated, it will probably go away in a few weeks. The infection cannot be spread from one person to another (is not contagious). Follow these instructions at home:  Use over-the-counter and prescription medicines only as told by your doctor.  Scrub your skin every day with dandruff shampoo as told by your doctor.  Do not scratch your skin in the rash area.  Avoid places that are hot and humid.  Do not use tanning booths.  Try to avoid sweating a lot. Contact a doctor if:  Your symptoms get worse.  You have a fever.  You have redness, swelling, or pain in the rash area.  You have fluid or blood coming from your rash.  Your rash feels warm to the touch.  You have pus or a bad smell coming from your rash.  Your rash comes back (recurs) after treatment. Summary  Tinea versicolor is a skin infection. It causes a rash of light or dark patches on your skin.  The rash is most common on the chest, back, neck, or upper arms. This infection usually does not cause other problems.  Use over-the-counter and prescription medicines only as told by your doctor.  If the  infection is treated, it will probably go away in a few weeks. This information is not intended to replace advice given to you by your health care provider. Make sure you discuss any questions you have with your health care provider. Document Revised: 06/03/2017 Document Reviewed: 02/21/2017 Elsevier Patient Education  2020 [employer].

## 2020-01-28 NOTE — Assessment & Plan Note (Signed)
A: Patient presents with a chronic (>1 year), worsening, hypopigmented rash about her lips, nose, and eyebrows that is associated with burning pain, previously associated with itching and scaling. She was previously treated by [facility] for presumed seborrheic dermatitis with hydrocortisone cream and miconazole without improvement. She has also tried oral terbinafine, eczema cream, Cetaphil lotion, and an antifungal cream in the past. Rash fluoresces on [facility] today, consistent with pityriasis versicolor.   P: Will try terbinafine 1% cream BID for 2 weeks. - Sent a referral to [facility] if her rash does not improve with terbinafine cream.

## 2020-01-28 NOTE — Assessment & Plan Note (Signed)
A: Patient's blood pressure today is 126/62, well controlled on current medications.   P: Continue amlodipine 10mg  daily.

## 2020-01-28 NOTE — Progress Notes (Signed)
   CC: facial rash  HPI:  [name] is a 54 y.o. female with PMHx of asthma, pre-diabetes, and severe MDD presenting due to concern regarding a chronic facial rash. She states she first noticed her rash January or February 2020. She states her rash started on her nose and has since spread to her eyebrows. She states her rash has worsened acutely over the past few weeks, spreading to her left eyebrow and has lightened in color. She currently endorses burning pain but notes she has had itching in the past. She was seen by Dr. Shan Levans at [facility] 04/23/19 with a hypopigmented, itchy, scaling rash and was diagnosed with seborrheic dermatitis and treated with hydrocortisone cream and miconazole, without improvement. She states she has also tried terbinafine cream, hydrocortisone cream, and Cetaphil cream without improvement. She was seen mid-June for sinusitis/pharyngitis and continues to experience sinus pressure, but denies any cough, congestion, fevers, nausea, vomiting, or any other symptoms including photosensitivity and joint pain. She denies any scalp lesions or hair loss.   Past Medical History:  Diagnosis Date  . Anemia   . Asthma   . Bursitis of left hip   . Decreased appetite   . GERD (gastroesophageal reflux disease)   . History of hiatal hernia    repaired  . History of stomach ulcers   . Hypertension   . Migraine    "maybe twice in the last 10 yrs" (10/16/2013)  . PONV (postoperative nausea and vomiting)   . Shortness of breath dyspnea    SHx: Patient currently smokes cigarettes but does not specify how much or how long she has smoked. She denies alcohol use or other drug use.   Review of Systems:  All others negative as noted in HPI. Also no diarrhea, constipation, recent changes in weight.    Vitals:   01/28/20 1348  BP: (!) 126/62  Pulse: 94  Temp: 98.8 F (37.1 C)  TempSrc: Oral  SpO2: 100%  Weight: 177 lb 14.4 oz (80.7 kg)  Height: 5\' 1"  (1.549 m)    Physical Exam: Constitutional: Patient appears well. No acute distress. Eyes: No conjunctival injection. Sclera non-icteric.  HENT: Moist mucus membranes. There is minimal tenderness along maxillary sinuses with no active nasal discharge. Respiratory: Lungs are clear to auscultation, bilaterally. No wheezes, rales, or rhonchi. Cardiovascular: Regular rate and rhythm. No murmurs, rubs, or gallops. Skin: There are patchy areas of hypopigmentation surrounding central portion of patient's face, including areas around her lips, nose (including nasolabial folds), and eyebrows with minimal scaling and no erythematous borders. Hypopigmented regions fluoresce under Wood's Lamp. There is a roughly 0.5x1.5cm raised, brown hyperpigmented lesion without crusting or scaling present on patient's right forehead.  Psychiatric: Patient has limited affect and appears withdrawn.  Assessment & Plan:   See Encounters Tab for problem based charting.  Patient seen with Dr. Jimmye Norman.  Jeralyn Bennett, PGY1 [facility]  Pager: [phone]

## 2020-01-30 NOTE — Progress Notes (Signed)
Internal Medicine Clinic Attending  I saw and evaluated the patient.  I personally confirmed the key portions of the history and exam documented by Dr. Konrad Penta.  The assessment, diagnosis, and plan were formulated together and I agree with the documentation in the resident's note.  While the macular hypopigmented patches seem consistent with tinea versicolor, we were surprised that she didn't experience improvement with terbinafine.  Dermatology referral is being made in case she doesn't have resolution with empiric topical.

## 2020-02-25 ENCOUNTER — Encounter: Payer: Self-pay | Admitting: Internal Medicine

## 2020-02-25 ENCOUNTER — Other Ambulatory Visit: Payer: Self-pay

## 2020-02-25 ENCOUNTER — Ambulatory Visit (INDEPENDENT_AMBULATORY_CARE_PROVIDER_SITE_OTHER): Payer: Self-pay | Admitting: Internal Medicine

## 2020-02-25 DIAGNOSIS — R21 Rash and other nonspecific skin eruption: Secondary | ICD-10-CM

## 2020-02-25 DIAGNOSIS — I10 Essential (primary) hypertension: Secondary | ICD-10-CM

## 2020-02-25 NOTE — Assessment & Plan Note (Signed)
BP Readings from Last 3 Encounters:  02/25/20 (!) 141/73  01/28/20 (!) 126/62  12/17/19 (!) 147/82   Ms. Klonowski states that she has not taken her medications yet today.  She endorses feeling frustrated at this time given lack of sleep, she works nights.  Assessment/plan:  Current regimen includes amlodipine 10 mg daily.  Pressure was well controlled at last visit, so blood pressure elevation today is likely due to patient's frustration at this time.  We will hold off on adjusting medications at this time.  -Reassess blood pressure at next visit -Consider addition of another agent if patient's blood pressure continues to be elevated.

## 2020-02-25 NOTE — Assessment & Plan Note (Signed)
[name] presents today for follow-up of the facial rash that was evaluated approximately 3 weeks ago.  Since that time, she tried using the terbinafine cream prescribed but noted significant burning when he was applied.  She applies it always after washing her face.  Due to the burning she stopped using it after 3-4 applications.  In that time she noticed her rash started to improve regardless.  She has contacted the dermatologist office and has a follow-up in the next couple weeks  Assessment/plan: Chronic hypopigmented rash along bilateral eyebrows and nose that was previously felt to be tinea versicolor.  Last year she was evaluated by dermatology who felt rash was more consistent with seborrheic dermatitis; she was prescribed hydrocortisone and miconazole cream without significant improvement.  At this time given that rash is self improving, will hold off on adding on any additional treatment.  Will follow up dermatology's recommendations

## 2020-02-25 NOTE — Patient Instructions (Signed)
It was nice seeing you today! Thank you for choosing [facility] for your Primary Care.    Today we talked about:   1. Rash: I am glad to hear this is getting better on its own. For now, we will wait to try and new medications since you have a follow up with Dermatology soon. We will follow up their recommendations.   2. Blood pressure: Your blood pressure is a bit elevated today. We will recheck it at your next appointment before making medication changes.

## 2020-02-25 NOTE — Progress Notes (Signed)
   CC: Rash follow up  HPI:  [name] is a 54 y.o. with a PMHx as listed below who presents to the clinic for rash follow up.   Please see the Encounters tab for problem-based Assessment & Plan regarding status of patient's acute and chronic conditions.  Past Medical History:  Diagnosis Date  . Anemia   . Asthma   . Bursitis of left hip   . Decreased appetite   . GERD (gastroesophageal reflux disease)   . History of hiatal hernia    repaired  . History of stomach ulcers   . Hypertension   . Migraine    "maybe twice in the last 10 yrs" (10/16/2013)  . PONV (postoperative nausea and vomiting)   . Shortness of breath dyspnea    Review of Systems: Review of Systems  Constitutional: Negative for chills and fever.  Skin: Positive for rash. Negative for itching.  Neurological: Negative for sensory change.   Physical Exam:  Vitals:   02/25/20 1319 02/25/20 1332  BP: (!) 149/91 (!) 141/73  Pulse: 91 89  SpO2: 99%   Weight: 177 lb 4.8 oz (80.4 kg)     Physical Exam Vitals and nursing note reviewed.  Constitutional:      General: She is not in acute distress.    Appearance: She is normal weight.  Skin:    General: Skin is warm and dry.     Comments: Very faint well demarcated, hypopigmented rash along the bilateral eyebrows and bridge of the nose. Compared to images in chart from 2020, rash has improved.   Neurological:     General: No focal deficit present.     Mental Status: She is alert and oriented to person, place, and time. Mental status is at baseline.  Psychiatric:        Mood and Affect: Mood normal.        Behavior: Behavior normal.    Assessment & Plan:   See Encounters Tab for problem based charting.  Patient discussed with Dr. Evette Doffing

## 2020-02-26 NOTE — Progress Notes (Signed)
Internal Medicine Clinic Attending  Case discussed with Dr. Basaraba  At the time of the visit.  We reviewed the resident's history and exam and pertinent patient test results.  I agree with the assessment, diagnosis, and plan of care documented in the resident's note.  

## 2020-03-05 NOTE — Telephone Encounter (Signed)
Review meds 

## 2020-05-19 ENCOUNTER — Ambulatory Visit: Payer: Commercial Managed Care - PPO | Admitting: Dermatology

## 2020-05-19 ENCOUNTER — Other Ambulatory Visit: Payer: Self-pay

## 2020-05-19 DIAGNOSIS — L819 Disorder of pigmentation, unspecified: Secondary | ICD-10-CM | POA: Diagnosis not present

## 2020-05-19 DIAGNOSIS — L219 Seborrheic dermatitis, unspecified: Secondary | ICD-10-CM

## 2020-05-19 MED ORDER — KETOCONAZOLE 2 % EX CREA: TOPICAL_CREAM | CUTANEOUS | 0 refills | Status: DC

## 2020-05-19 MED ORDER — HYDROCORTISONE 2.5 % EX CREA: TOPICAL_CREAM | CUTANEOUS | 3 refills | Status: DC

## 2020-05-19 NOTE — Progress Notes (Signed)
   Follow-Up Visit   Subjective  [name] is a 54 y.o. female who presents for the following: Rash (New pt c/o itchy rash on her face x 1 year not going away, pt tried Hydrocortisone cream burned her skin, otc Cerave cream and Cetaphil cream with a poor response ).  The following portions of the chart were reviewed this encounter and updated as appropriate:  Tobacco  Allergies  Meds  Problems  Med Hx  Surg Hx  Fam Hx     Review of Systems:  No other skin or systemic complaints except as noted in HPI or Assessment and Plan.  Objective  Well appearing patient in no apparent distress; mood and affect are within normal limits.  A focused examination was performed including face. Relevant physical exam findings are noted in the Assessment and Plan.  Objective  brows, nose, mid face: Pink patches with greasy scale, hyperpigmentation   Images         Assessment & Plan  Seborrheic dermatitis - chronic persistent condition.  Treatable but not curable.  Stress and cooler weather exacerbates brows, nose, mid face With dyschromia Common condition, No cure can only control   Start Ketoconazole 2% cream apply to face at bedtime T/TH/Sat Start Hydrocortisone 2.5% cream to face at bedtime M/W/F  Reordered Medications hydrocortisone 2.5 % cream ketoconazole (NIZORAL) 2 % cream  Return in about 3 months (around 08/19/2020) for [facility] derm .  IMarye Round, CMA, am acting as scribe for Sarina Ser, MD .  Documentation: I have reviewed the above documentation for accuracy and completeness, and I agree with the above.  Sarina Ser, MD

## 2020-05-20 ENCOUNTER — Encounter: Payer: Self-pay | Admitting: Dermatology

## 2020-08-07 ENCOUNTER — Other Ambulatory Visit: Payer: Self-pay

## 2020-08-07 MED ORDER — AMLODIPINE BESYLATE 10 MG PO TABS: 10.0000 mg | ORAL_TABLET | Freq: Every day | ORAL | 3 refills | Status: DC

## 2020-08-07 NOTE — Telephone Encounter (Signed)
Pt is requesting her amLODipine (NORVASC) 10 MG tablet going to  [id number] [address], [place] Phone:  [phone]  Fax:  [phone]      ( pt stated that her pharmacy told her they have been reaching out to the office for a week now and have heard nothing back. Pt has been out of her medicine for a week now )

## 2020-08-20 ENCOUNTER — Ambulatory Visit: Payer: Commercial Managed Care - PPO | Admitting: Internal Medicine

## 2020-08-20 ENCOUNTER — Encounter: Payer: Self-pay | Admitting: Internal Medicine

## 2020-08-20 ENCOUNTER — Other Ambulatory Visit (HOSPITAL_COMMUNITY)
Admission: RE | Admit: 2020-08-20 | Discharge: 2020-08-20 | Disposition: A | Discharge status: Home / Self Care | Payer: Commercial Managed Care - PPO | Source: Ambulatory Visit | Attending: Student in an Organized Health Care Education/Training Program | Admitting: Student in an Organized Health Care Education/Training Program

## 2020-08-20 VITALS — BP 133/76 | HR 97 | Temp 98.6°F | Wt 176.7 lb

## 2020-08-20 DIAGNOSIS — Z124 Encounter for screening for malignant neoplasm of cervix: Secondary | ICD-10-CM | POA: Insufficient documentation

## 2020-08-20 DIAGNOSIS — Z1231 Encounter for screening mammogram for malignant neoplasm of breast: Secondary | ICD-10-CM

## 2020-08-20 DIAGNOSIS — I10 Essential (primary) hypertension: Secondary | ICD-10-CM

## 2020-08-20 DIAGNOSIS — Z0000 Encounter for general adult medical examination without abnormal findings: Secondary | ICD-10-CM | POA: Diagnosis not present

## 2020-08-20 DIAGNOSIS — G8929 Other chronic pain: Secondary | ICD-10-CM

## 2020-08-20 DIAGNOSIS — M545 Low back pain, unspecified: Secondary | ICD-10-CM

## 2020-08-20 NOTE — Assessment & Plan Note (Signed)
Agreeable to undergo hep C screening

## 2020-08-20 NOTE — Assessment & Plan Note (Signed)
Presents with complaint of acute on chronic back pain. 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 Denies any falls, trauma. Denies any weakness, numbness, tingling, fevers, chills. Mentions having history of hip arthritis as well and taking ibuprofen 800mg  almost every day as well as using heat pads with minimal benefit.  A/P Exam shows paraspinal tenderness likely due to muscle strain. No red flag symptoms. Discussed benefit of physical therapy. She declined due to work-schedule. Provided instructions on exercises for her back she can perform at home. - Home exercises for back pain provided - Advised on risks of chronic NSAID use

## 2020-08-20 NOTE — Assessment & Plan Note (Signed)
Agreeable to get mammogram done

## 2020-08-20 NOTE — Progress Notes (Signed)
   CC: Back pain  HPI: [name] is a 55 y.o. with PMH listed below presenting with complaint of back pain. Please see problem based assessment and plan for further details.  Past Medical History:  Diagnosis Date  . Anemia   . Asthma   . Bursitis of left hip   . Decreased appetite   . GERD (gastroesophageal reflux disease)   . History of hiatal hernia    repaired  . History of stomach ulcers   . Hypertension   . Migraine    "maybe twice in the last 10 yrs" (10/16/2013)  . PONV (postoperative nausea and vomiting)   . Shortness of breath dyspnea    Review of Systems: Review of Systems  Constitutional: Negative for chills, fever and malaise/fatigue.  Eyes: Negative for blurred vision and double vision.  Respiratory: Negative for cough and shortness of breath.   Cardiovascular: Negative for chest pain, palpitations and leg swelling.  Gastrointestinal: Negative for constipation, diarrhea, nausea and vomiting.  Genitourinary: Negative for dysuria.  Musculoskeletal: Positive for back pain.  Neurological: Negative for dizziness and headaches.  All other systems reviewed and are negative.    Physical Exam: Vitals:   08/20/20 1425  BP: 133/76  Pulse: 97  Temp: 98.6 F (37 C)  TempSrc: Oral  SpO2: 100%  Weight: 176 lb 11.2 oz (80.2 kg)   Gen: Well-developed, well nourished, tired appearing, NAD HEENT: NCAT head, hearing intact CV: RRR, S1, S2 normal Pulm: CTAB, No rales, no wheezes GU: Normal external vagina, no vaginal discharge, cervical os intact Extm: ROM intact, Peripheral pulses intact, Paraspinal tenderness on lumbar spine R side Skin: Dry, Warm, normal turgor  Assessment & Plan:   Hypertension BP Readings from Last 3 Encounters:  08/20/20 133/76  02/25/20 (!) 141/73  01/28/20 (!) 126/62   Present for management of chronic conditions. Currently on amlodipine. Mentions taking meds as prescribed. Bp slightly above goal. Currently endorsing acute pain.  Can expand anti-hypertensive regimen if persistently elevated.  - C/w amlodipine 10mg  daily - Will get bmp to assess for hypertensive renal disease  Encounter for screening mammogram for malignant neoplasm of breast Agreeable to get mammogram done  Screening for cervical cancer Pap smear performed in clinic today with HPV + Cytology  Healthcare maintenance Agreeable to undergo hep C screening  Chronic right-sided low back pain without sciatica Presents with complaint of acute on chronic back pain. 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 Denies any falls, trauma. Denies any weakness, numbness, tingling, fevers, chills. Mentions having history of hip arthritis as well and taking ibuprofen 800mg  almost every day as well as using heat pads with minimal benefit.  A/P Exam shows paraspinal tenderness likely due to muscle strain. No red flag symptoms. Discussed benefit of physical therapy. She declined due to work-schedule. Provided instructions on exercises for her back she can perform at home. - Home exercises for back pain provided - Advised on risks of chronic NSAID use   Patient discussed with Dr. Evette Doffing  -Gilberto Better, [facility] Pager: [phone]

## 2020-08-20 NOTE — Patient Instructions (Signed)
Thank you for allowing [place] to provide your care today. Today we discussed your blood pressure and back pain    I have ordered bmp, hepatitis C labs for you. I will call if any are abnormal.    Today we made no changes to your medications.    Please follow-up in 1 year    Should you have any questions or concerns please call the internal medicine clinic at [phone].     Back Exercises These exercises help to make your trunk and back strong. They also help to keep the lower back flexible. Doing these exercises can help to prevent back pain or lessen existing pain.  If you have back pain, try to do these exercises 2-3 times each day or as told by your doctor.  As you get better, do the exercises once each day. Repeat the exercises more often as told by your doctor.  To stop back pain from coming back, do the exercises once each day, or as told by your doctor. Exercises Single knee to chest Do these steps 3-5 times in a row for each leg: 1. Lie on your back on a firm bed or the floor with your legs stretched out. 2. Bring one knee to your chest. 3. Grab your knee or thigh with both hands and hold them it in place. 4. Pull on your knee until you feel a gentle stretch in your lower back or buttocks. 5. Keep doing the stretch for 10-30 seconds. 6. Slowly let go of your leg and straighten it. Pelvic tilt Do these steps 5-10 times in a row: 1. Lie on your back on a firm bed or the floor with your legs stretched out. 2. Bend your knees so they point up to the ceiling. Your feet should be flat on the floor. 3. Tighten your lower belly (abdomen) muscles to press your lower back against the floor. This will make your tailbone point up to the ceiling instead of pointing down to your feet or the floor. 4. Stay in this position for 5-10 seconds while you gently tighten your muscles and breathe evenly. Cat-cow Do these steps until your lower back bends more easily: 1. Get on your hands and  knees on a firm surface. Keep your hands under your shoulders, and keep your knees under your hips. You may put padding under your knees. 2. Let your head hang down toward your chest. Tighten (contract) the muscles in your belly. Point your tailbone toward the floor so your lower back becomes rounded like the back of a cat. 3. Stay in this position for 5 seconds. 4. Slowly lift your head. Let the muscles of your belly relax. Point your tailbone up toward the ceiling so your back forms a sagging arch like the back of a cow. 5. Stay in this position for 5 seconds.   Press-ups Do these steps 5-10 times in a row: 1. Lie on your belly (face-down) on the floor. 2. Place your hands near your head, about shoulder-width apart. 3. While you keep your back relaxed and keep your hips on the floor, slowly straighten your arms to raise the top half of your body and lift your shoulders. Do not use your back muscles. You may change where you place your hands in order to make yourself more comfortable. 4. Stay in this position for 5 seconds. 5. Slowly return to lying flat on the floor.   Bridges Do these steps 10 times in a row: 1. Lie on  your back on a firm surface. 2. Bend your knees so they point up to the ceiling. Your feet should be flat on the floor. Your arms should be flat at your sides, next to your body. 3. Tighten your butt muscles and lift your butt off the floor until your waist is almost as high as your knees. If you do not feel the muscles working in your butt and the back of your thighs, slide your feet 1-2 inches farther away from your butt. 4. Stay in this position for 3-5 seconds. 5. Slowly lower your butt to the floor, and let your butt muscles relax. If this exercise is too easy, try doing it with your arms crossed over your chest.   Belly crunches Do these steps 5-10 times in a row: 1. Lie on your back on a firm bed or the floor with your legs stretched out. 2. Bend your knees so they  point up to the ceiling. Your feet should be flat on the floor. 3. Cross your arms over your chest. 4. Tip your chin a little bit toward your chest but do not bend your neck. 5. Tighten your belly muscles and slowly raise your chest just enough to lift your shoulder blades a tiny bit off of the floor. Avoid raising your body higher than that, because it can put too much stress on your low back. 6. Slowly lower your chest and your head to the floor. Back lifts Do these steps 5-10 times in a row: 1. Lie on your belly (face-down) with your arms at your sides, and rest your forehead on the floor. 2. Tighten the muscles in your legs and your butt. 3. Slowly lift your chest off of the floor while you keep your hips on the floor. Keep the back of your head in line with the curve in your back. Look at the floor while you do this. 4. Stay in this position for 3-5 seconds. 5. Slowly lower your chest and your face to the floor. Contact a doctor if:  Your back pain gets a lot worse when you do an exercise.  Your back pain does not get better 2 hours after you exercise. If you have any of these problems, stop doing the exercises. Do not do them again unless your doctor says it is okay. Get help right away if:  You have sudden, very bad back pain. If this happens, stop doing the exercises. Do not do them again unless your doctor says it is okay. This information is not intended to replace advice given to you by your health care provider. Make sure you discuss any questions you have with your health care provider. Document Revised: 03/16/2018 Document Reviewed: 03/16/2018 Elsevier Patient Education  2021 [employer].

## 2020-08-20 NOTE — Assessment & Plan Note (Signed)
BP Readings from Last 3 Encounters:  08/20/20 133/76  02/25/20 (!) 141/73  01/28/20 (!) 126/62   Present for management of chronic conditions. Currently on amlodipine. Mentions taking meds as prescribed. Bp slightly above goal. Currently endorsing acute pain. Can expand anti-hypertensive regimen if persistently elevated.  - C/w amlodipine 10mg  daily - Will get bmp to assess for hypertensive renal disease

## 2020-08-20 NOTE — Assessment & Plan Note (Addendum)
Pap smear performed in clinic today with HPV + Cytology

## 2020-08-21 LAB — BMP8+ANION GAP
Anion Gap: 14 mmol/L (ref 10.0–18.0)
BUN/Creatinine Ratio: 17 (ref 9–23)
BUN: 15 mg/dL (ref 6–24)
CO2: 26 mmol/L (ref 20–29)
Calcium: 9.9 mg/dL (ref 8.7–10.2)
Chloride: 100 mmol/L (ref 96–106)
Creatinine, Ser: 0.89 mg/dL (ref 0.57–1.00)
GFR calc Af Amer: 86 mL/min/{1.73_m2} (ref >59)
GFR calc non Af Amer: 75 mL/min/{1.73_m2} (ref >59)
Glucose: 165 mg/dL — ABNORMAL HIGH (ref 65–99)
Potassium: 3.7 mmol/L (ref 3.5–5.2)
Sodium: 140 mmol/L (ref 134–144)

## 2020-08-21 LAB — HEPATITIS C ANTIBODY: Hep C Virus Ab: 0.3 s/co ratio (ref 0.0–0.9)

## 2020-08-21 NOTE — Progress Notes (Signed)
Internal Medicine Clinic Attending  Case discussed with Dr. Lee  At the time of the visit.  We reviewed the resident's history and exam and pertinent patient test results.  I agree with the assessment, diagnosis, and plan of care documented in the resident's note.    

## 2020-08-26 LAB — CYTOLOGY - PAP
Comment: NEGATIVE
Comment: NEGATIVE
Diagnosis: NEGATIVE
HPV 16: POSITIVE — AB
HPV 18 / 45: NEGATIVE
High risk HPV: POSITIVE — AB

## 2020-08-27 ENCOUNTER — Telehealth: Payer: Self-pay | Admitting: Internal Medicine

## 2020-08-27 NOTE — Telephone Encounter (Addendum)
Attempted to contact patient to discuss pap smear results. No response. Voice message left.   ADDENDUM:  Patient updated regarding pap smear results. Plan to repeat pap in 1 year.

## 2020-08-28 ENCOUNTER — Telehealth: Payer: Self-pay | Admitting: Internal Medicine

## 2020-08-28 NOTE — Telephone Encounter (Signed)
Opened in error

## 2020-10-06 ENCOUNTER — Telehealth: Payer: Self-pay | Admitting: *Deleted

## 2020-10-06 NOTE — Telephone Encounter (Signed)
Patient called in asking for immunization record to be mailed to her. Verified address and placed in outgoing mail. She also requested MMR titers. It appears that these may have been drawn at [facility] in [place]. She will contact them for this info.

## 2020-11-18 ENCOUNTER — Telehealth: Payer: Self-pay

## 2020-11-18 NOTE — Telephone Encounter (Signed)
Returned call to patient. Scheduled Nurse visit for 5/23 at 3:45 for PPD and Hep B vaccine. Patient aware she has to return in 48-72 hours to have PPD read.

## 2020-11-18 NOTE — Telephone Encounter (Signed)
PT is requesting a call back she is requesting the Hep B shot and  Tb

## 2020-11-24 ENCOUNTER — Ambulatory Visit (INDEPENDENT_AMBULATORY_CARE_PROVIDER_SITE_OTHER): Payer: Self-pay | Admitting: *Deleted

## 2020-11-24 DIAGNOSIS — Z111 Encounter for screening for respiratory tuberculosis: Secondary | ICD-10-CM

## 2020-11-24 DIAGNOSIS — Z23 Encounter for immunization: Secondary | ICD-10-CM

## 2020-11-25 NOTE — Progress Notes (Signed)
   Patient presents for PPD placement for school Denies previous positive TB test  Denies known exposure to TB   Tuberculin skin test applied to left ventral forearm.  Patient aware that she needs to return in 48 hours for PPD reading   L. Davia Smyre, BSN, RN-BC

## 2020-11-26 LAB — TB SKIN TEST
Induration: 0 mm
TB Skin Test: NEGATIVE

## 2020-12-25 ENCOUNTER — Ambulatory Visit: Payer: Self-pay

## 2020-12-29 ENCOUNTER — Ambulatory Visit (INDEPENDENT_AMBULATORY_CARE_PROVIDER_SITE_OTHER): Payer: Managed Care, Other (non HMO) | Admitting: *Deleted

## 2020-12-29 DIAGNOSIS — Z23 Encounter for immunization: Secondary | ICD-10-CM | POA: Diagnosis not present

## 2020-12-29 NOTE — Progress Notes (Signed)
   Patient presents for second dose of Heplisav.  States feeling well. Denies adverse reaction to first dose.  Given IM left deltoid. Patient tolerated well.  Immunization report given to patient.  Hubbard Hartshorn, BSN, RN-BC

## 2021-03-23 ENCOUNTER — Ambulatory Visit: Payer: Managed Care, Other (non HMO)

## 2021-04-22 ENCOUNTER — Ambulatory Visit (INDEPENDENT_AMBULATORY_CARE_PROVIDER_SITE_OTHER): Payer: Managed Care, Other (non HMO)

## 2021-04-22 DIAGNOSIS — Z23 Encounter for immunization: Secondary | ICD-10-CM | POA: Diagnosis not present

## 2021-05-19 ENCOUNTER — Telehealth: Payer: Self-pay

## 2021-05-19 NOTE — Telephone Encounter (Signed)
Received TC from patient who c/o vaginal bleeding with clots X 1.5 weeks.  She states she started having vaginal bleeding on 11/4 or 11/5 which was heavy, states she was saturating more than 1 pad/hour and passing clots.  Pt states she did this "until Sunday, 11/13, at which time she c/o "gushes of blood when she moves a lot and clots"  She states the clots range in size from a dime size to over a quarter size.  She informs this nurse she had a BTL in 1989. RN informed patient she needs to be evaluated asap and instructed patient to present to ED for evaluation.  She was given the address of [place] [address] as well and she verbalized understanding. SChaplin, RN,BSN

## 2021-05-19 NOTE — Telephone Encounter (Signed)
Agree with plan. Pt requires evaluation in the ED.

## 2021-05-20 ENCOUNTER — Emergency Department (HOSPITAL_BASED_OUTPATIENT_CLINIC_OR_DEPARTMENT_OTHER)
Admission: EM | Admit: 2021-05-20 | Discharge: 2021-05-20 | Disposition: A | Discharge status: Home / Self Care | Payer: Managed Care, Other (non HMO) | Attending: Emergency Medicine | Admitting: Emergency Medicine

## 2021-05-20 ENCOUNTER — Other Ambulatory Visit: Payer: Self-pay

## 2021-05-20 DIAGNOSIS — F17210 Nicotine dependence, cigarettes, uncomplicated: Secondary | ICD-10-CM | POA: Diagnosis not present

## 2021-05-20 DIAGNOSIS — J4521 Mild intermittent asthma with (acute) exacerbation: Secondary | ICD-10-CM | POA: Diagnosis not present

## 2021-05-20 DIAGNOSIS — N390 Urinary tract infection, site not specified: Secondary | ICD-10-CM | POA: Insufficient documentation

## 2021-05-20 DIAGNOSIS — Z79899 Other long term (current) drug therapy: Secondary | ICD-10-CM | POA: Insufficient documentation

## 2021-05-20 DIAGNOSIS — N939 Abnormal uterine and vaginal bleeding, unspecified: Secondary | ICD-10-CM | POA: Insufficient documentation

## 2021-05-20 DIAGNOSIS — Z96641 Presence of right artificial hip joint: Secondary | ICD-10-CM | POA: Insufficient documentation

## 2021-05-20 DIAGNOSIS — Z7951 Long term (current) use of inhaled steroids: Secondary | ICD-10-CM | POA: Diagnosis not present

## 2021-05-20 DIAGNOSIS — D649 Anemia, unspecified: Secondary | ICD-10-CM | POA: Diagnosis not present

## 2021-05-20 DIAGNOSIS — I10 Essential (primary) hypertension: Secondary | ICD-10-CM | POA: Insufficient documentation

## 2021-05-20 LAB — URINALYSIS, ROUTINE W REFLEX MICROSCOPIC
Bilirubin Urine: NEGATIVE
Glucose, UA: NEGATIVE mg/dL
Ketones, ur: NEGATIVE mg/dL
Leukocytes,Ua: NEGATIVE
Nitrite: POSITIVE — AB
Protein, ur: 30 mg/dL — AB
Specific Gravity, Urine: 1.022 (ref 1.005–1.030)
pH: 6 (ref 5.0–8.0)

## 2021-05-20 LAB — CBC WITH DIFFERENTIAL/PLATELET
Abs Immature Granulocytes: 0.02 10*3/uL (ref 0.00–0.07)
Basophils Absolute: 0.1 10*3/uL (ref 0.0–0.1)
Basophils Relative: 1 %
Eosinophils Absolute: 0.3 10*3/uL (ref 0.0–0.5)
Eosinophils Relative: 5 %
HCT: 33.5 % — ABNORMAL LOW (ref 36.0–46.0)
Hemoglobin: 10.8 g/dL — ABNORMAL LOW (ref 12.0–15.0)
Immature Granulocytes: 0 %
Lymphocytes Relative: 47 %
Lymphs Abs: 3 10*3/uL (ref 0.7–4.0)
MCH: 27.8 pg (ref 26.0–34.0)
MCHC: 32.2 g/dL (ref 30.0–36.0)
MCV: 86.1 fL (ref 80.0–100.0)
Monocytes Absolute: 0.4 10*3/uL (ref 0.1–1.0)
Monocytes Relative: 6 %
Neutro Abs: 2.6 10*3/uL (ref 1.7–7.7)
Neutrophils Relative %: 41 %
Platelets: 257 10*3/uL (ref 150–400)
RBC: 3.89 MIL/uL (ref 3.87–5.11)
RDW: 14.9 % (ref 11.5–15.5)
WBC: 6.4 10*3/uL (ref 4.0–10.5)
nRBC: 0 % (ref 0.0–0.2)

## 2021-05-20 LAB — COMPREHENSIVE METABOLIC PANEL
ALT: 8 U/L (ref 0–44)
AST: 12 U/L — ABNORMAL LOW (ref 15–41)
Albumin: 3.6 g/dL (ref 3.5–5.0)
Alkaline Phosphatase: 51 U/L (ref 38–126)
Anion gap: 5 (ref 5–15)
BUN: 9 mg/dL (ref 6–20)
CO2: 29 mmol/L (ref 22–32)
Calcium: 9.4 mg/dL (ref 8.9–10.3)
Chloride: 105 mmol/L (ref 98–111)
Creatinine, Ser: 0.55 mg/dL (ref 0.44–1.00)
GFR, Estimated: >60 mL/min (ref >60)
Glucose, Bld: 132 mg/dL — ABNORMAL HIGH (ref 70–99)
Potassium: 3.6 mmol/L (ref 3.5–5.1)
Sodium: 139 mmol/L (ref 135–145)
Total Bilirubin: 0.2 mg/dL — ABNORMAL LOW (ref 0.3–1.2)
Total Protein: 6.8 g/dL (ref 6.5–8.1)

## 2021-05-20 MED ORDER — SULFAMETHOXAZOLE-TRIMETHOPRIM 800-160 MG PO TABS: 1.0000 | ORAL_TABLET | Freq: Two times a day (BID) | ORAL | 0 refills | Status: AC

## 2021-05-20 NOTE — ED Notes (Signed)
ED Provider at bedside. 

## 2021-05-20 NOTE — ED Provider Notes (Signed)
[facility] EMERGENCY DEPT Provider Note   CSN: [id number] Arrival date & time: 05/20/21  1409     History Chief Complaint  Patient presents with   Vaginal Bleeding    [name] is a 55 y.o. female.  Patient presents with chief complaint of vaginal bleeding.  Symptoms of been going on for about a week.  She typically states that she bleeds about 4 days or so during her regular menstrual cycle.  She missed last month but bleeding this week is when she would typically expect her regular menstrual cycle but the duration has been longer than normal.  She called her OB/GYN doctor who advised her to go to the ER for evaluation.  She states the bleeding is slowed down and its minimal for the past 2 days compared to what it was on the first 2 days.  She denies any fevers or cough or vomiting or diarrhea.  Complaining of cramping in the left lower quadrant.  No lightheadedness shortness of breath or chest pain reported.      Past Medical History:  Diagnosis Date   Anemia    Asthma    Bursitis of left hip    Decreased appetite    GERD (gastroesophageal reflux disease)    History of hiatal hernia    repaired   History of stomach ulcers    Hypertension    Migraine    "maybe twice in the last 10 yrs" (10/16/2013)   PONV (postoperative nausea and vomiting)    Shortness of breath dyspnea     Patient Active Problem List   Diagnosis Date Noted   Screening for cervical cancer 08/20/2020   Healthcare maintenance 08/20/2020   Chronic right-sided low back pain without sciatica 08/20/2020   Adjustment disorder with disturbance of conduct    MDD (major depressive disorder), severe ([place]) 02/03/2019   Radiculopathy affecting upper extremity 01/17/2019   Asthma, not well controlled, mild intermittent, with acute exacerbation 02/20/2018   Osteoarthritis of right hip 12/23/2016   GERD (gastroesophageal reflux disease) 03/08/2015   Encounter for screening mammogram for  malignant neoplasm of breast 03/08/2015   History of cholecystectomy 03/08/2015   Prediabetes 08/05/2014   Hyperlipidemia 08/05/2014   Hypertension 08/04/2014   Iron deficiency anemia 08/04/2014   Obesity 08/04/2014   History of diverticulitis 10/16/2013    Past Surgical History:  Procedure Laterality Date   BARTHOLIN GLAND CYST EXCISION     BREAST LUMPECTOMY WITH RADIOACTIVE SEED LOCALIZATION Left 01/23/2016   Procedure: BREAST LUMPECTOMY WITH RADIOACTIVE SEED LOCALIZATION;  Surgeon: Jackolyn Confer, MD;  Location: [facility];  Service: General;  Laterality: Left;   [place]  ~ [place]  07/2003   HERNIA REPAIR     INCISION AND DRAINAGE ABSCESS  10/2005   Bartholin abscess.   TONSILLECTOMY AND ADENOIDECTOMY  1983   TOTAL HIP ARTHROPLASTY Right 06/05/2017   TOTAL HIP ARTHROPLASTY Right 06/06/2017   Procedure: RIGHT TOTAL HIP ARTHROPLASTY ANTERIOR APPROACH;  Surgeon: Rod Can, MD;  Location: [facility];  Service: Orthopedics;  Laterality: Right;  Needs RNFA   TUBAL LIGATION  1994     OB History   No obstetric history on file.     Family History  Problem Relation Age of Onset   Cancer Mother 51       unknown   Heart attack Father 33   Heart attack Sister 4   Diabetes Sister     Social History  Tobacco Use   Smoking status: Smoker, Current Status Unknown    Years: 1.00    Types: Cigarettes    Last attempt to quit: 06/01/2014    Years since quitting: 6.9   Smokeless tobacco: Never  Vaping Use   Vaping Use: Never used  Substance Use Topics   Alcohol use: Not Currently    Alcohol/week: 0.0 standard drinks   Drug use: No    Home Medications Prior to Admission medications   Medication Sig Start Date End Date Taking? Authorizing Provider  sulfamethoxazole-trimethoprim (BACTRIM DS) 800-160 MG tablet Take 1 tablet by mouth 2 (two) times daily for 5 days. 05/20/21 05/25/21 Yes Luna Fuse, MD  acetaminophen  (TYLENOL) 500 MG tablet Take 1 tablet (500 mg total) by mouth every 6 (six) hours as needed. 09/20/19   Fawze, Mina A, PA-C  albuterol (PROVENTIL HFA;VENTOLIN HFA) 108 (90 Base) MCG/ACT inhaler Inhale 2 puffs into the lungs every 6 (six) hours as needed for wheezing or shortness of breath. 08/21/18   Lorella Nimrod, MD  albuterol (PROVENTIL) (2.5 MG/3ML) 0.083% nebulizer solution Take 3 mLs (2.5 mg total) by nebulization every 6 (six) hours as needed for wheezing or shortness of breath. 12/17/19   Masoudi, Elhamalsadat, MD  amLODipine (NORVASC) 10 MG tablet Take 1 tablet (10 mg total) by mouth daily. 08/07/20   Riesa Pope, MD  budesonide-formoterol (SYMBICORT) 80-4.5 MCG/ACT inhaler INHALE TWO PUFFS BY MOUTH INTO LUNGS TWICE DAILY Patient not taking: Reported on 02/02/2019 08/21/18   Lorella Nimrod, MD  cetirizine (ZYRTEC) 10 MG tablet Take 10 mg by mouth daily.    [provider]  cholecalciferol (VITAMIN D) 1000 units tablet Take 1 tablet (1,000 Units total) by mouth daily. 11/18/16   Alphonzo Grieve, MD  cyclobenzaprine (FLEXERIL) 10 MG tablet Take 1 tablet (10 mg total) by mouth 2 (two) times daily as needed for muscle spasms. 09/20/19   Fawze, Mina A, PA-C  diclofenac sodium (VOLTAREN) 1 % GEL Apply 4 g topically 4 (four) times daily. Patient taking differently: Apply 4 g topically 4 (four) times daily as needed (pain).  06/08/17   Swinteck, Aaron Edelman, MD  ferrous sulfate 325 (65 FE) MG tablet Take 1 tablet (325 mg total) by mouth daily. Patient not taking: Reported on 02/02/2019 08/11/16 08/11/17  Ledell Noss, MD  fluticasone [facility]) 50 MCG/ACT nasal spray USE TWO SPRAY(S) IN EACH NOSTRIL ONCE DAILY Patient taking differently: Place 2 sprays into both nostrils daily as needed for allergies or rhinitis.  08/05/16   Alphonzo Grieve, MD  hydrocortisone 2.5 % cream Apply to face at bedtime Monday/Wednesday/friday 05/19/20   Ralene Bathe, MD  ketoconazole (NIZORAL) 2 % cream Apply to face at  bedtime Tuesday/Thursday/Saturday 05/19/20   Ralene Bathe, MD  traZODone (DESYREL) 50 MG tablet Take 1 tablet (50 mg total) by mouth at bedtime and may repeat dose one time if needed. 02/04/19   Derrill Center, NP  vitamin B-12 (CYANOCOBALAMIN) 1000 MCG tablet Take 1,000 mcg by mouth daily.    [provider]  Vitamin D, Ergocalciferol, (DRISDOL) 1.25 MG (50000 UT) CAPS capsule Take 1 capsule (50,000 Units total) by mouth every 7 (seven) days. Patient not taking: Reported on 02/02/2019 11/21/18   Lorella Nimrod, MD    Allergies    Amoxicillin, Ampicillin, Mobic [meloxicam], Montelukast sodium, and Phenergan [promethazine hcl]  Review of Systems   Review of Systems  Constitutional:  Negative for fever.  HENT:  Negative for ear pain.   Eyes:  Negative for pain.  Respiratory:  Negative for cough.   Cardiovascular:  Negative for chest pain.  Gastrointestinal:  Negative for diarrhea.  Genitourinary:  Negative for flank pain.  Musculoskeletal:  Negative for back pain.  Skin:  Negative for rash.  Neurological:  Negative for headaches.   Physical Exam Updated Vital Signs BP (!) 134/91 (BP Location: Right Arm)   Pulse 80   Temp 98.9 F (37.2 C)   Resp 18   Ht 5\' 1"  (1.549 m)   Wt 79.4 kg   SpO2 100%   BMI 33.07 kg/m   Physical Exam Constitutional:      General: She is not in acute distress.    Appearance: Normal appearance.  HENT:     Head: Normocephalic.     Nose: Nose normal.  Eyes:     Extraocular Movements: Extraocular movements intact.  Cardiovascular:     Rate and Rhythm: Normal rate.  Pulmonary:     Effort: Pulmonary effort is normal.  Abdominal:     Tenderness: There is no abdominal tenderness. There is no guarding or rebound.  Musculoskeletal:        General: Normal range of motion.     Cervical back: Normal range of motion.  Neurological:     General: No focal deficit present.     Mental Status: She is alert. Mental status is at baseline.    ED  Results / Procedures / Treatments   Labs (all labs ordered are listed, but only abnormal results are displayed) Labs Reviewed  COMPREHENSIVE METABOLIC PANEL - Abnormal; Notable for the following components:      Result Value   Glucose, Bld 132 (*)    AST 12 (*)    Total Bilirubin 0.2 (*)    All other components within normal limits  CBC WITH DIFFERENTIAL/PLATELET - Abnormal; Notable for the following components:   Hemoglobin 10.8 (*)    HCT 33.5 (*)    All other components within normal limits  URINALYSIS, ROUTINE W REFLEX MICROSCOPIC - Abnormal; Notable for the following components:   APPearance HAZY (*)    Hgb urine dipstick LARGE (*)    Protein, ur 30 (*)    Nitrite POSITIVE (*)    Bacteria, UA MANY (*)    All other components within normal limits    EKG None  Radiology No results found.  Procedures Procedures   Medications Ordered in ED Medications - No data to display  ED Course  I have reviewed the triage vital signs and the nursing notes.  Pertinent labs & imaging results that were available during my care of the patient were reviewed by me and considered in my medical decision making (see chart for details).    MDM Rules/Calculators/A&P                           Labs unremarkable patient mildly anemic but consistent with prior levels.  Vaginal exam shows dried blood no active bleeding small amount of blood in the vaginal vault.  No pelvic tenderness on exam.  Recommending outpatient follow-up with OB/GYN doctor within the week, advised return for increased bleeding heavy bleeding lightheadedness shortness of breath or any additional concerns.  Urinary analysis shows UTI, given prescription antibiotics take at home. Final Clinical Impression(s) / ED Diagnoses Final diagnoses:  Vaginal bleeding  Anemia, unspecified type  Urinary tract infection without hematuria, site unspecified    Rx / DC Orders ED Discharge Orders  Ordered     sulfamethoxazole-trimethoprim (BACTRIM DS) 800-160 MG tablet  2 times daily        05/20/21 1655             Luna Fuse, MD 05/20/21 1655

## 2021-05-20 NOTE — ED Triage Notes (Signed)
Pt presents to POV. Pt c/o  vaginal bleeding and LLQ abd cramping. Pt reports that pain and bleeding  began 11/5. Pt reports that typically menstrual cycle is over in 4d.

## 2021-05-20 NOTE — Discharge Instructions (Signed)
Call your primary care doctor or specialist as discussed in the next 2-3 days.   Return immediately back to the ER if:  Your symptoms worsen within the next 12-24 hours. You develop new symptoms such as new fevers, persistent vomiting, new pain, shortness of breath, or new weakness or numbness, or if you have any other concerns.  

## 2021-05-21 ENCOUNTER — Encounter: Payer: Managed Care, Other (non HMO) | Admitting: Internal Medicine

## 2021-06-07 ENCOUNTER — Other Ambulatory Visit: Payer: Self-pay

## 2021-06-07 ENCOUNTER — Encounter (HOSPITAL_COMMUNITY): Payer: Self-pay

## 2021-06-07 ENCOUNTER — Emergency Department (HOSPITAL_COMMUNITY): Payer: Self-pay

## 2021-06-07 ENCOUNTER — Inpatient Hospital Stay (HOSPITAL_COMMUNITY)
Admission: EM | Admit: 2021-06-07 | Discharge: 2021-06-14 | DRG: 194 | Disposition: A | Discharge status: Home / Self Care | Payer: Self-pay | Attending: Family Medicine | Admitting: Family Medicine

## 2021-06-07 DIAGNOSIS — J111 Influenza due to unidentified influenza virus with other respiratory manifestations: Secondary | ICD-10-CM

## 2021-06-07 DIAGNOSIS — Z8711 Personal history of peptic ulcer disease: Secondary | ICD-10-CM

## 2021-06-07 DIAGNOSIS — K5900 Constipation, unspecified: Secondary | ICD-10-CM | POA: Diagnosis present

## 2021-06-07 DIAGNOSIS — Z6833 Body mass index (BMI) 33.0-33.9, adult: Secondary | ICD-10-CM

## 2021-06-07 DIAGNOSIS — H9202 Otalgia, left ear: Secondary | ICD-10-CM | POA: Diagnosis not present

## 2021-06-07 DIAGNOSIS — E669 Obesity, unspecified: Secondary | ICD-10-CM | POA: Diagnosis present

## 2021-06-07 DIAGNOSIS — E860 Dehydration: Secondary | ICD-10-CM | POA: Diagnosis present

## 2021-06-07 DIAGNOSIS — J188 Other pneumonia, unspecified organism: Secondary | ICD-10-CM

## 2021-06-07 DIAGNOSIS — K449 Diaphragmatic hernia without obstruction or gangrene: Secondary | ICD-10-CM | POA: Diagnosis present

## 2021-06-07 DIAGNOSIS — Z888 Allergy status to other drugs, medicaments and biological substances status: Secondary | ICD-10-CM

## 2021-06-07 DIAGNOSIS — Z880 Allergy status to penicillin: Secondary | ICD-10-CM

## 2021-06-07 DIAGNOSIS — D509 Iron deficiency anemia, unspecified: Secondary | ICD-10-CM | POA: Diagnosis present

## 2021-06-07 DIAGNOSIS — K219 Gastro-esophageal reflux disease without esophagitis: Secondary | ICD-10-CM | POA: Diagnosis present

## 2021-06-07 DIAGNOSIS — Z833 Family history of diabetes mellitus: Secondary | ICD-10-CM

## 2021-06-07 DIAGNOSIS — J1000 Influenza due to other identified influenza virus with unspecified type of pneumonia: Principal | ICD-10-CM | POA: Diagnosis present

## 2021-06-07 DIAGNOSIS — J4521 Mild intermittent asthma with (acute) exacerbation: Secondary | ICD-10-CM | POA: Diagnosis present

## 2021-06-07 DIAGNOSIS — E785 Hyperlipidemia, unspecified: Secondary | ICD-10-CM | POA: Diagnosis present

## 2021-06-07 DIAGNOSIS — I700 Atherosclerosis of aorta: Secondary | ICD-10-CM

## 2021-06-07 DIAGNOSIS — R739 Hyperglycemia, unspecified: Secondary | ICD-10-CM | POA: Diagnosis present

## 2021-06-07 DIAGNOSIS — J189 Pneumonia, unspecified organism: Secondary | ICD-10-CM

## 2021-06-07 DIAGNOSIS — E876 Hypokalemia: Secondary | ICD-10-CM | POA: Diagnosis present

## 2021-06-07 DIAGNOSIS — R7303 Prediabetes: Secondary | ICD-10-CM | POA: Diagnosis present

## 2021-06-07 DIAGNOSIS — I472 Ventricular tachycardia, unspecified: Secondary | ICD-10-CM | POA: Diagnosis not present

## 2021-06-07 DIAGNOSIS — J101 Influenza due to other identified influenza virus with other respiratory manifestations: Secondary | ICD-10-CM | POA: Diagnosis present

## 2021-06-07 DIAGNOSIS — F419 Anxiety disorder, unspecified: Secondary | ICD-10-CM | POA: Diagnosis present

## 2021-06-07 DIAGNOSIS — Z9851 Tubal ligation status: Secondary | ICD-10-CM

## 2021-06-07 DIAGNOSIS — R042 Hemoptysis: Secondary | ICD-10-CM | POA: Diagnosis present

## 2021-06-07 DIAGNOSIS — E66811 Obesity, class 1: Secondary | ICD-10-CM | POA: Diagnosis present

## 2021-06-07 DIAGNOSIS — R0789 Other chest pain: Secondary | ICD-10-CM

## 2021-06-07 DIAGNOSIS — T380X5A Adverse effect of glucocorticoids and synthetic analogues, initial encounter: Secondary | ICD-10-CM | POA: Diagnosis present

## 2021-06-07 DIAGNOSIS — F17210 Nicotine dependence, cigarettes, uncomplicated: Secondary | ICD-10-CM | POA: Diagnosis present

## 2021-06-07 DIAGNOSIS — Z8249 Family history of ischemic heart disease and other diseases of the circulatory system: Secondary | ICD-10-CM

## 2021-06-07 DIAGNOSIS — J45901 Unspecified asthma with (acute) exacerbation: Secondary | ICD-10-CM | POA: Diagnosis present

## 2021-06-07 DIAGNOSIS — Z20822 Contact with and (suspected) exposure to covid-19: Secondary | ICD-10-CM | POA: Diagnosis present

## 2021-06-07 DIAGNOSIS — R42 Dizziness and giddiness: Secondary | ICD-10-CM

## 2021-06-07 DIAGNOSIS — I10 Essential (primary) hypertension: Secondary | ICD-10-CM | POA: Diagnosis present

## 2021-06-07 LAB — RESP PANEL BY RT-PCR (FLU A&B, COVID) ARPGX2
Influenza A by PCR: POSITIVE — AB
Influenza B by PCR: NEGATIVE
SARS Coronavirus 2 by RT PCR: NEGATIVE

## 2021-06-07 NOTE — ED Provider Notes (Addendum)
Emergency Medicine Provider Triage Evaluation Note  [name] , Cook 55 y.o. female  was evaluated in triage.  Pt complains of multiple complaints.  Headache, sore throat, congestion, rhinorrhea, shortness of breath, cough, abdominal pain, diarrhea, weakness, fever.  Some myalgias as well.  Review of Systems  Positive: Headache, sore throat, congestion, rhinorrhea, shortness of breath, cough, abdominal pain, diarrhea, weakness, fever Negative:   Physical Exam  There were no vitals taken for this visit. Gen:   Awake, no distress   Resp:  Normal effort  MSK:   Moves extremities without difficulty  Other:    Medical Decision Making  Medically screening exam initiated at 9:48 PM.  Appropriate orders placed.  [name] was informed that the remainder of the evaluation will be completed by another provider, this initial triage assessment does not replace that evaluation, and the importance of remaining in the ED until their evaluation is complete.  UR Sx      Ann Dauzat A, PA-C 06/07/21 2151    Ann Biles, MD 06/08/21 [phone]

## 2021-06-07 NOTE — ED Triage Notes (Signed)
Pt reports with generalized body aches, chest pain, vomiting, chills, cough, and fever since Friday.

## 2021-06-08 ENCOUNTER — Encounter (HOSPITAL_COMMUNITY): Payer: Self-pay | Admitting: Internal Medicine

## 2021-06-08 DIAGNOSIS — J45901 Unspecified asthma with (acute) exacerbation: Secondary | ICD-10-CM | POA: Diagnosis present

## 2021-06-08 DIAGNOSIS — J101 Influenza due to other identified influenza virus with other respiratory manifestations: Secondary | ICD-10-CM

## 2021-06-08 LAB — CBC WITH DIFFERENTIAL/PLATELET
Abs Immature Granulocytes: 0.06 10*3/uL (ref 0.00–0.07)
Basophils Absolute: 0 10*3/uL (ref 0.0–0.1)
Basophils Relative: 0 %
Eosinophils Absolute: 0 10*3/uL (ref 0.0–0.5)
Eosinophils Relative: 0 %
HCT: 31.4 % — ABNORMAL LOW (ref 36.0–46.0)
Hemoglobin: 10.5 g/dL — ABNORMAL LOW (ref 12.0–15.0)
Immature Granulocytes: 1 %
Lymphocytes Relative: 5 %
Lymphs Abs: 0.5 10*3/uL — ABNORMAL LOW (ref 0.7–4.0)
MCH: 28.7 pg (ref 26.0–34.0)
MCHC: 33.4 g/dL (ref 30.0–36.0)
MCV: 85.8 fL (ref 80.0–100.0)
Monocytes Absolute: 0.4 10*3/uL (ref 0.1–1.0)
Monocytes Relative: 3 %
Neutro Abs: 10.1 10*3/uL — ABNORMAL HIGH (ref 1.7–7.7)
Neutrophils Relative %: 91 %
Platelets: 244 10*3/uL (ref 150–400)
RBC: 3.66 MIL/uL — ABNORMAL LOW (ref 3.87–5.11)
RDW: 15 % (ref 11.5–15.5)
WBC: 11.1 10*3/uL — ABNORMAL HIGH (ref 4.0–10.5)
nRBC: 0 % (ref 0.0–0.2)

## 2021-06-08 LAB — COMPREHENSIVE METABOLIC PANEL
ALT: 17 U/L (ref 0–44)
AST: 18 U/L (ref 15–41)
Albumin: 4.1 g/dL (ref 3.5–5.0)
Alkaline Phosphatase: 50 U/L (ref 38–126)
Anion gap: 10 (ref 5–15)
BUN: 12 mg/dL (ref 6–20)
CO2: 25 mmol/L (ref 22–32)
Calcium: 9.4 mg/dL (ref 8.9–10.3)
Chloride: 100 mmol/L (ref 98–111)
Creatinine, Ser: 0.67 mg/dL (ref 0.44–1.00)
GFR, Estimated: >60 mL/min (ref >60)
Glucose, Bld: 161 mg/dL — ABNORMAL HIGH (ref 70–99)
Potassium: 3.1 mmol/L — ABNORMAL LOW (ref 3.5–5.1)
Sodium: 135 mmol/L (ref 135–145)
Total Bilirubin: 0.6 mg/dL (ref 0.3–1.2)
Total Protein: 7.6 g/dL (ref 6.5–8.1)

## 2021-06-08 LAB — GLUCOSE, CAPILLARY: Glucose-Capillary: 180 mg/dL — ABNORMAL HIGH (ref 70–99)

## 2021-06-08 LAB — CBG MONITORING, ED: Glucose-Capillary: 140 mg/dL — ABNORMAL HIGH (ref 70–99)

## 2021-06-08 MED ORDER — LEVOFLOXACIN IN D5W 750 MG/150ML IV SOLN
750.0000 mg | Freq: Once | INTRAVENOUS | Status: AC
  Administered 2021-06-08: 750 mg via INTRAVENOUS
  Filled 2021-06-08: qty 150

## 2021-06-08 MED ORDER — LORAZEPAM 2 MG/ML IJ SOLN
0.5000 mg | Freq: Once | INTRAMUSCULAR | Status: AC
  Administered 2021-06-08: 0.5 mg via INTRAVENOUS
  Filled 2021-06-08: qty 1

## 2021-06-08 MED ORDER — BENZONATATE 100 MG PO CAPS
100.0000 mg | ORAL_CAPSULE | Freq: Once | ORAL | Status: AC
  Administered 2021-06-08: 100 mg via ORAL
  Filled 2021-06-08: qty 1

## 2021-06-08 MED ORDER — LORATADINE 10 MG PO TABS
10.0000 mg | ORAL_TABLET | Freq: Every day | ORAL | Status: DC
  Administered 2021-06-08 – 2021-06-14 (×7): 10 mg via ORAL
  Filled 2021-06-08 (×7): qty 1

## 2021-06-08 MED ORDER — OSELTAMIVIR PHOSPHATE 75 MG PO CAPS
75.0000 mg | ORAL_CAPSULE | Freq: Two times a day (BID) | ORAL | Status: AC
  Administered 2021-06-08 – 2021-06-13 (×10): 75 mg via ORAL
  Filled 2021-06-08 (×11): qty 1

## 2021-06-08 MED ORDER — ACETAMINOPHEN 500 MG PO TABS
1000.0000 mg | ORAL_TABLET | Freq: Once | ORAL | Status: AC
  Administered 2021-06-08: 1000 mg via ORAL
  Filled 2021-06-08: qty 2

## 2021-06-08 MED ORDER — ACETAMINOPHEN 325 MG PO TABS
650.0000 mg | ORAL_TABLET | Freq: Four times a day (QID) | ORAL | Status: DC | PRN
  Administered 2021-06-09 (×3): 650 mg via ORAL
  Filled 2021-06-08 (×3): qty 2

## 2021-06-08 MED ORDER — METOPROLOL TARTRATE 5 MG/5ML IV SOLN: 5.0000 mg | Freq: Three times a day (TID) | INTRAVENOUS | Status: DC | PRN

## 2021-06-08 MED ORDER — POTASSIUM CHLORIDE IN NACL 40-0.9 MEQ/L-% IV SOLN
INTRAVENOUS | Status: AC
  Filled 2021-06-08: qty 1000

## 2021-06-08 MED ORDER — HYDROMORPHONE HCL 1 MG/ML IJ SOLN
1.0000 mg | Freq: Once | INTRAMUSCULAR | Status: AC
  Administered 2021-06-08: 1 mg via INTRAVENOUS
  Filled 2021-06-08: qty 1

## 2021-06-08 MED ORDER — INSULIN ASPART 100 UNIT/ML IJ SOLN
0.0000 [IU] | Freq: Three times a day (TID) | INTRAMUSCULAR | Status: DC
  Administered 2021-06-08 – 2021-06-09 (×2): 3 [IU] via SUBCUTANEOUS
  Administered 2021-06-09 (×2): 4 [IU] via SUBCUTANEOUS
  Administered 2021-06-10: 7 [IU] via SUBCUTANEOUS
  Administered 2021-06-10: 3 [IU] via SUBCUTANEOUS
  Administered 2021-06-10: 4 [IU] via SUBCUTANEOUS
  Administered 2021-06-11 (×2): 3 [IU] via SUBCUTANEOUS
  Administered 2021-06-11: 7 [IU] via SUBCUTANEOUS
  Administered 2021-06-12 (×3): 3 [IU] via SUBCUTANEOUS
  Administered 2021-06-13: 7 [IU] via SUBCUTANEOUS
  Administered 2021-06-13 – 2021-06-14 (×2): 4 [IU] via SUBCUTANEOUS
  Filled 2021-06-08: qty 0.2

## 2021-06-08 MED ORDER — POTASSIUM CHLORIDE IN NACL 20-0.9 MEQ/L-% IV SOLN
INTRAVENOUS | Status: DC
  Filled 2021-06-08 (×4): qty 1000

## 2021-06-08 MED ORDER — ONDANSETRON HCL 4 MG/2ML IJ SOLN
4.0000 mg | Freq: Four times a day (QID) | INTRAMUSCULAR | Status: DC | PRN
  Administered 2021-06-11: 4 mg via INTRAVENOUS
  Filled 2021-06-08: qty 2

## 2021-06-08 MED ORDER — PANTOPRAZOLE SODIUM 40 MG IV SOLR
40.0000 mg | Freq: Once | INTRAVENOUS | Status: AC
  Administered 2021-06-08: 40 mg via INTRAVENOUS
  Filled 2021-06-08: qty 40

## 2021-06-08 MED ORDER — ONDANSETRON HCL 4 MG PO TABS: 4.0000 mg | ORAL_TABLET | Freq: Four times a day (QID) | ORAL | Status: DC | PRN

## 2021-06-08 MED ORDER — ALBUTEROL SULFATE HFA 108 (90 BASE) MCG/ACT IN AERS
2.0000 | INHALATION_SPRAY | Freq: Once | RESPIRATORY_TRACT | Status: AC
  Administered 2021-06-08: 2 via RESPIRATORY_TRACT
  Filled 2021-06-08: qty 6.7

## 2021-06-08 MED ORDER — ONDANSETRON HCL 4 MG/2ML IJ SOLN
4.0000 mg | Freq: Once | INTRAMUSCULAR | Status: AC
  Administered 2021-06-08: 4 mg via INTRAVENOUS
  Filled 2021-06-08: qty 2

## 2021-06-08 MED ORDER — MAGNESIUM SULFATE 2 GM/50ML IV SOLN
2.0000 g | Freq: Once | INTRAVENOUS | Status: AC
  Administered 2021-06-08: 2 g via INTRAVENOUS
  Filled 2021-06-08: qty 50

## 2021-06-08 MED ORDER — SODIUM CHLORIDE 0.9 % IV BOLUS
1000.0000 mL | Freq: Once | INTRAVENOUS | Status: AC
  Administered 2021-06-08: 1000 mL via INTRAVENOUS

## 2021-06-08 MED ORDER — ACETAMINOPHEN 650 MG RE SUPP: 650.0000 mg | Freq: Four times a day (QID) | RECTAL | Status: DC | PRN

## 2021-06-08 MED ORDER — FAMOTIDINE 20 MG PO TABS
20.0000 mg | ORAL_TABLET | Freq: Every day | ORAL | Status: DC
  Administered 2021-06-08 – 2021-06-14 (×7): 20 mg via ORAL
  Filled 2021-06-08 (×7): qty 1

## 2021-06-08 MED ORDER — POTASSIUM CHLORIDE 10 MEQ/100ML IV SOLN
10.0000 meq | Freq: Once | INTRAVENOUS | Status: AC
  Administered 2021-06-08: 10 meq via INTRAVENOUS
  Filled 2021-06-08: qty 100

## 2021-06-08 MED ORDER — AMLODIPINE BESYLATE 10 MG PO TABS
10.0000 mg | ORAL_TABLET | Freq: Every day | ORAL | Status: DC
  Administered 2021-06-09 – 2021-06-14 (×6): 10 mg via ORAL
  Filled 2021-06-08 (×6): qty 1

## 2021-06-08 MED ORDER — ALBUTEROL SULFATE (2.5 MG/3ML) 0.083% IN NEBU
3.0000 mL | INHALATION_SOLUTION | RESPIRATORY_TRACT | Status: DC | PRN
  Administered 2021-06-10 – 2021-06-14 (×3): 3 mL via RESPIRATORY_TRACT
  Filled 2021-06-08 (×3): qty 3

## 2021-06-08 MED ORDER — METHYLPREDNISOLONE SODIUM SUCC 40 MG IJ SOLR
40.0000 mg | Freq: Once | INTRAMUSCULAR | Status: AC
  Administered 2021-06-08: 40 mg via INTRAVENOUS
  Filled 2021-06-08: qty 1

## 2021-06-08 NOTE — ED Provider Notes (Signed)
[facility] DEPT Provider Note   CSN: [id number] Arrival date & time: 06/07/21  2141     History Chief Complaint  Patient presents with   Generalized Body Aches   Chest Pain   Emesis   Cough   Chills    [name] is a 55 y.o. female with PMHx HTN, GERD, anemia who presents to the ED today with URI like symptoms for the past 4 days. Pt complains of subjective fevers, cough, shortness of breath, sore throat, congestion, rhinorrhea, headache, abdominal pain, vomiting, and generalized weakness. Per chart review pt was also complaining of diarrhea however she denies this to me currently. She denies any recent sick contacts. She states that she received the flu vaccine in October of this year. She has been taking Tylenol for her fevers however has not been able to keep much down over the past few days. She states on the way here she began feeling "delirious"  as well.   The history is provided by the patient and medical records.      Past Medical History:  Diagnosis Date   Anemia    Asthma    Bursitis of left hip    Decreased appetite    GERD (gastroesophageal reflux disease)    History of hiatal hernia    repaired   History of stomach ulcers    Hypertension    Migraine    "maybe twice in the last 10 yrs" (10/16/2013)   PONV (postoperative nausea and vomiting)    Shortness of breath dyspnea     Patient Active Problem List   Diagnosis Date Noted   Influenza A 06/08/2021   Screening for cervical cancer 08/20/2020   Healthcare maintenance 08/20/2020   Chronic right-sided low back pain without sciatica 08/20/2020   Adjustment disorder with disturbance of conduct    MDD (major depressive disorder), severe ([place]) 02/03/2019   Radiculopathy affecting upper extremity 01/17/2019   Asthma, not well controlled, mild intermittent, with acute exacerbation 02/20/2018   Osteoarthritis of right hip 12/23/2016   GERD (gastroesophageal reflux disease)  03/08/2015   Encounter for screening mammogram for malignant neoplasm of breast 03/08/2015   History of cholecystectomy 03/08/2015   Prediabetes 08/05/2014   Hyperlipidemia 08/05/2014   Hypertension 08/04/2014   Iron deficiency anemia 08/04/2014   Obesity 08/04/2014   History of diverticulitis 10/16/2013    Past Surgical History:  Procedure Laterality Date   BARTHOLIN GLAND CYST EXCISION     BREAST LUMPECTOMY WITH RADIOACTIVE SEED LOCALIZATION Left 01/23/2016   Procedure: BREAST LUMPECTOMY WITH RADIOACTIVE SEED LOCALIZATION;  Surgeon: Jackolyn Confer, MD;  Location: [facility];  Service: General;  Laterality: Left;   [place]  ~ [place]  07/2003   HERNIA REPAIR     INCISION AND DRAINAGE ABSCESS  10/2005   Bartholin abscess.   TONSILLECTOMY AND ADENOIDECTOMY  1983   TOTAL HIP ARTHROPLASTY Right 06/05/2017   TOTAL HIP ARTHROPLASTY Right 06/06/2017   Procedure: RIGHT TOTAL HIP ARTHROPLASTY ANTERIOR APPROACH;  Surgeon: Rod Can, MD;  Location: [facility];  Service: Orthopedics;  Laterality: Right;  Needs RNFA   TUBAL LIGATION  1994     OB History   No obstetric history on file.     Family History  Problem Relation Age of Onset   Cancer Mother 23       unknown   Heart attack Father 39   Heart attack Sister 38   Diabetes  Sister     Social History   Tobacco Use   Smoking status: Smoker, Current Status Unknown    Years: 1.00    Types: Cigarettes    Last attempt to quit: 06/01/2014    Years since quitting: 7.0   Smokeless tobacco: Never  Vaping Use   Vaping Use: Never used  Substance Use Topics   Alcohol use: Not Currently    Alcohol/week: 0.0 standard drinks   Drug use: No    Home Medications Prior to Admission medications   Medication Sig Start Date End Date Taking? Authorizing Provider  acetaminophen (TYLENOL) 500 MG tablet Take 1 tablet (500 mg total) by mouth every 6 (six) hours as needed. 09/20/19   Fawze,  Mina A, PA-C  albuterol (PROVENTIL HFA;VENTOLIN HFA) 108 (90 Base) MCG/ACT inhaler Inhale 2 puffs into the lungs every 6 (six) hours as needed for wheezing or shortness of breath. 08/21/18   Lorella Nimrod, MD  albuterol (PROVENTIL) (2.5 MG/3ML) 0.083% nebulizer solution Take 3 mLs (2.5 mg total) by nebulization every 6 (six) hours as needed for wheezing or shortness of breath. 12/17/19   Masoudi, Elhamalsadat, MD  amLODipine (NORVASC) 10 MG tablet Take 1 tablet (10 mg total) by mouth daily. 08/07/20   Riesa Pope, MD  budesonide-formoterol (SYMBICORT) 80-4.5 MCG/ACT inhaler INHALE TWO PUFFS BY MOUTH INTO LUNGS TWICE DAILY Patient not taking: Reported on 02/02/2019 08/21/18   Lorella Nimrod, MD  cetirizine (ZYRTEC) 10 MG tablet Take 10 mg by mouth daily.    [provider]  cholecalciferol (VITAMIN D) 1000 units tablet Take 1 tablet (1,000 Units total) by mouth daily. 11/18/16   Alphonzo Grieve, MD  cyclobenzaprine (FLEXERIL) 10 MG tablet Take 1 tablet (10 mg total) by mouth 2 (two) times daily as needed for muscle spasms. 09/20/19   Fawze, Mina A, PA-C  diclofenac sodium (VOLTAREN) 1 % GEL Apply 4 g topically 4 (four) times daily. Patient taking differently: Apply 4 g topically 4 (four) times daily as needed (pain).  06/08/17   Swinteck, Aaron Edelman, MD  ferrous sulfate 325 (65 FE) MG tablet Take 1 tablet (325 mg total) by mouth daily. Patient not taking: Reported on 02/02/2019 08/11/16 08/11/17  Ledell Noss, MD  fluticasone [facility]) 50 MCG/ACT nasal spray USE TWO SPRAY(S) IN EACH NOSTRIL ONCE DAILY Patient taking differently: Place 2 sprays into both nostrils daily as needed for allergies or rhinitis.  08/05/16   Alphonzo Grieve, MD  hydrocortisone 2.5 % cream Apply to face at bedtime Monday/Wednesday/friday 05/19/20   Ralene Bathe, MD  ketoconazole (NIZORAL) 2 % cream Apply to face at bedtime Tuesday/Thursday/Saturday 05/19/20   Ralene Bathe, MD  traZODone (DESYREL) 50 MG tablet Take 1  tablet (50 mg total) by mouth at bedtime and may repeat dose one time if needed. 02/04/19   Derrill Center, NP  vitamin B-12 (CYANOCOBALAMIN) 1000 MCG tablet Take 1,000 mcg by mouth daily.    [provider]  Vitamin D, Ergocalciferol, (DRISDOL) 1.25 MG (50000 UT) CAPS capsule Take 1 capsule (50,000 Units total) by mouth every 7 (seven) days. Patient not taking: Reported on 02/02/2019 11/21/18   Lorella Nimrod, MD    Allergies    Amoxicillin, Ampicillin, Mobic [meloxicam], Montelukast sodium, and Phenergan [promethazine hcl]  Review of Systems   Review of Systems  Constitutional:  Positive for chills, fatigue and fever.  HENT:  Positive for congestion, rhinorrhea and sore throat. Negative for trouble swallowing and voice change.   Respiratory:  Positive for cough, chest  tightness and shortness of breath.   Cardiovascular:  Negative for chest pain, palpitations and leg swelling.  Gastrointestinal:  Positive for abdominal pain, nausea and vomiting. Negative for diarrhea.  Musculoskeletal:  Positive for myalgias.  Neurological:  Positive for headaches.  All other systems reviewed and are negative.  Physical Exam Updated Vital Signs BP 128/80 (BP Location: Left Arm)   Pulse (!) 125   Temp 99.6 F (37.6 C) (Oral)   Resp 20   SpO2 97%   Physical Exam Vitals and nursing note reviewed.  Constitutional:      Appearance: She is ill-appearing.     Comments: Able to ambulate from waiting room without desaturation  HENT:     Head: Normocephalic and atraumatic.     Mouth/Throat:     Mouth: Mucous membranes are dry.  Eyes:     Conjunctiva/sclera: Conjunctivae normal.  Cardiovascular:     Rate and Rhythm: Normal rate and regular rhythm.  Pulmonary:     Effort: Tachypnea present.     Breath sounds: Decreased breath sounds and wheezing present. No rhonchi or rales.     Comments: Tachypneic. Speaking in shorter sentences between coughing spells. Satting 100% on RA. Lungs with  diminished breath sounds and mild wheezing.  Abdominal:     Palpations: Abdomen is soft.     Tenderness: There is no abdominal tenderness. There is no guarding or rebound.  Musculoskeletal:     Cervical back: Neck supple.  Skin:    General: Skin is warm and dry.  Neurological:     Mental Status: She is alert.    ED Results / Procedures / Treatments   Labs (all labs ordered are listed, but only abnormal results are displayed) Labs Reviewed  RESP PANEL BY RT-PCR (FLU A&B, COVID) ARPGX2 - Abnormal; Notable for the following components:      Result Value   Influenza A by PCR POSITIVE (*)    All other components within normal limits  COMPREHENSIVE METABOLIC PANEL - Abnormal; Notable for the following components:   Potassium 3.1 (*)    Glucose, Bld 161 (*)    All other components within normal limits  CBC WITH DIFFERENTIAL/PLATELET - Abnormal; Notable for the following components:   WBC 11.1 (*)    RBC 3.66 (*)    Hemoglobin 10.5 (*)    HCT 31.4 (*)    Neutro Abs 10.1 (*)    Lymphs Abs 0.5 (*)    All other components within normal limits    EKG EKG Interpretation  Date/Time:  Monday June 08 2021 07:25:03 EST Ventricular Rate:  121 PR Interval:  146 QRS Duration: 128 QT Interval:  353 QTC Calculation: 501 R Axis:   59 Text Interpretation: Sinus tachycardia IVCD, consider atypical LBBB No significant change since last tracing Confirmed by Dorie Rank [phone]) on 06/08/2021 7:37:15 AM  Radiology DG Chest 2 View  Result Date: 06/07/2021 CLINICAL DATA:  Cough EXAM: CHEST - 2 VIEW COMPARISON:  Chest x-ray 02/02/2019 FINDINGS: The heart and mediastinal contours are unchanged. Possible developing trace left lower lobe airspace opacity. No pulmonary edema. No pleural effusion. No pneumothorax. No acute osseous abnormality. Right upper quadrant surgical clips. IMPRESSION: Possible developing trace left lower lobe airspace opacity. Electronically Signed   By: Iven Finn M.D.    On: 06/07/2021 22:22    Procedures Procedures   Medications Ordered in ED Medications  sodium chloride 0.9 % bolus 1,000 mL (0 mLs Intravenous Stopped 06/08/21 0852)  benzonatate (TESSALON) capsule 100 mg (  100 mg Oral Given 06/08/21 0705)  albuterol (VENTOLIN HFA) 108 (90 Base) MCG/ACT inhaler 2 puff (2 puffs Inhalation Given 06/08/21 0707)  potassium chloride 10 mEq in 100 mL IVPB (0 mEq Intravenous Stopped 06/08/21 0818)  magnesium sulfate IVPB 2 g 50 mL (0 g Intravenous Stopped 06/08/21 0819)  LORazepam (ATIVAN) injection 0.5 mg (0.5 mg Intravenous Given 06/08/21 0754)  acetaminophen (TYLENOL) tablet 1,000 mg (1,000 mg Oral Given 06/08/21 0753)  sodium chloride 0.9 % bolus 1,000 mL (1,000 mLs Intravenous Bolus 06/08/21 0927)  levofloxacin (LEVAQUIN) IVPB 750 mg (0 mg Intravenous Stopped 06/08/21 1209)    ED Course  I have reviewed the triage vital signs and the nursing notes.  Pertinent labs & imaging results that were available during my care of the patient were reviewed by me and considered in my medical decision making (see chart for details).    MDM Rules/Calculators/A&P                           55 year old female who presents to the ED today with complaint of URI-like symptoms for the past 4 days.  On arrival to the ED today her temperature was 99.3, has been taking Tylenol throughout the past several days due to subjective fevers.  She is noted to be significantly tachycardic in the 140s.  Respirations are even at 20 and she is satting 100% on room air.  She was medically screened and work-up started her to being placed in the waiting room.  EKG with sinus tachycardia with a heart rate of 134.  Her QTC is prolonged at this time 592.  CBC with mild elevation in leukocytosis at 11,100.  Hemoglobin stable at 10.5 with history of anemia.  CMP with a potassium of 3.1.  Suspect this is likely related to her vomiting; plan to replete.  She is test positive for flu A.  Her chest x-ray does show  possible developing trace left lower lobe airspace opacity.  I was personally able to visualize patient ambulating from the waiting room.  She does appear to be tachypneic and is speaking in shorter sentences however when she is brought to the room she is satting 100% on room air still.  She is known to have diminished breath sounds and some mild wheezing throughout.  She continues to be tachycardic.  We will plan for fluid hydration, Tessalon Perle, albuterol.  We will plan to repeat EKG at this time however with prolonged QTC I am unable to provide proper antiemetics.  If heart rate is unable to be properly brought down she may require admission for influenza. Out of the window for tamiflu.   EKG with continued prolonged qtc 501; ativan provided for antiemetic.   After 1 L Fluid bolus, albuterol, and tessalon perles pt continues to be tachycardic. Will provide additional fluid bolus and levaquin for PNA on CXR however if tachycardia continues will plan for admission.   Despite 2 L fluid bolus pt is still tachy in the 110s/120s. I do feel she requires admission at this time; will call hospitalist.   Discussed case with [facility] Dr. Olevia Bowens who agrees to evaluate patient for admission.   This note was prepared using Dragon voice recognition software and may include unintentional dictation errors due to the inherent limitations of voice recognition software.   Final Clinical Impression(s) / ED Diagnoses Final diagnoses:  Influenza  Dehydration  Hypokalemia  Community acquired pneumonia of left lower lobe of lung  Rx / DC Orders ED Discharge Orders     None        Eustaquio Maize, PA-C 06/08/21 1239    Dorie Rank, MD 06/10/21 1013

## 2021-06-08 NOTE — H&P (Signed)
History and Physical    Maureen L Chittum WUJ:[id number] DOB: 05/02/1966 DOA: 06/07/2021  PCP: Gaylan Gerold, DO  Patient coming from: Home.  I have personally briefly reviewed patient's old medical records in [place]  Chief Complaint: Body aches, CP, N/V, cough and fever since Friday.  HPI: [name] is a 55 y.o. female with medical history significant of anemia, mild intermittent asthma, left hip bursitis, GERD, hiatal hernia, history of gastric PUD, hypertension, migraine headaches, class I obesity who is coming to the emergency department with above chief complaint pain Friday.  No travel history or sick contacts.  He also has had dyspnea, wheezing, rhinorrhea, sore throat, lightheadedness, decreased appetite, chills, fatigue and malaise.  No earaches, hemoptysis, palpitations, PND, orthopnea or recent pitting edema of the lower extremities.  No diarrhea, constipation, melena or hematochezia.  Denied flank pain, dysuria, frequency or hematuria.  No polyuria, polydipsia, polyphagia or blurred vision.  ED Course: Initial vital signs were temperature 99.3 F, pulse 140, respirations 20, BP 158/88 mmHg O2 sat 96% on room air.  The patient received 2000 mL of NS bolus, KCl 10 mEq IVPB, albuterol MDI, 100 mg Tessalon Perles p.o. x1, lorazepam 0.5 mg IVP x1, magnesium sulfate 2 g IVPB and levofloxacin 750 mg IVPB.  Lab work: CBC showed a white count 11.1, hemoglobin 10.5 g/dL platelets 244.  CMP with a potassium of 3.1 mmol/L and a glucose level of 161 mg/dL.  Rest of the CMP values were unremarkable.  Influenza a PCR was positive.  Imaging: A 2 view chest radiograph showed possible developing trace left lower lobe airspace opacity.  Please see images and full radiology report for further details.  Review of Systems: As per HPI otherwise all other systems reviewed and are negative.  Past Medical History:  Diagnosis Date   Anemia    Asthma    Bursitis of left hip    Decreased  appetite    GERD (gastroesophageal reflux disease)    History of hiatal hernia    repaired   History of stomach ulcers    Hypertension    Migraine    "maybe twice in the last 10 yrs" (10/16/2013)   PONV (postoperative nausea and vomiting)    Shortness of breath dyspnea    Past Surgical History:  Procedure Laterality Date   BARTHOLIN GLAND CYST EXCISION     BREAST LUMPECTOMY WITH RADIOACTIVE SEED LOCALIZATION Left 01/23/2016   Procedure: BREAST LUMPECTOMY WITH RADIOACTIVE SEED LOCALIZATION;  Surgeon: Jackolyn Confer, MD;  Location: [place];  Service: General;  Laterality: Left;   [place]  ~ [place]  07/2003   HERNIA REPAIR     INCISION AND DRAINAGE ABSCESS  10/2005   Bartholin abscess.   TONSILLECTOMY AND ADENOIDECTOMY  1983   TOTAL HIP ARTHROPLASTY Right 06/05/2017   TOTAL HIP ARTHROPLASTY Right 06/06/2017   Procedure: RIGHT TOTAL HIP ARTHROPLASTY ANTERIOR APPROACH;  Surgeon: Rod Can, MD;  Location: [place];  Service: Orthopedics;  Laterality: Right;  Needs RNFA   TUBAL LIGATION  1994   Social History  reports that she has been smoking cigarettes. She has never used smokeless tobacco. She reports that she does not currently use alcohol. She reports that she does not use drugs.  Allergies  Allergen Reactions   Amoxicillin Rash    PATIENT HAS HAD A PCN REACTION WITH IMMEDIATE RASH, FACIAL/TONGUE/THROAT SWELLING, SOB, OR LIGHTHEADEDNESS WITH HYPOTENSION:  #  #  #  YES  #  #  #   HAS PT DEVELOPED SEVERE RASH INVOLVING MUCUS MEMBRANES or SKIN NECROSIS: #  #  #  YES  #  #  #     "On my face" Has patient had a PCN reaction that required hospitalization: No Has patient had a PCN reaction occurring within the last 10 years:  #  #  #  UNKNOWN  #  #  #  .    Ampicillin Rash    PATIENT HAS HAD A PCN REACTION WITH IMMEDIATE RASH, FACIAL/TONGUE/THROAT SWELLING, SOB, OR LIGHTHEADEDNESS WITH HYPOTENSION: # # # YES # # #  HAS PT  DEVELOPED SEVERE RASH INVOLVING MUCUS MEMBRANES or SKIN NECROSIS: # # # YES # # # "On my face"e Has patient had a PCN reaction that required hospitalization: No Has patient had a PCN reaction occurring within the last 10 years:  #  #  #  UNKNOWN  #  #  #       Mobic [Meloxicam] Rash   Montelukast Sodium Rash   Phenergan [Promethazine Hcl] Nausea And Vomiting   Family History  Problem Relation Age of Onset   Cancer Mother 48       unknown   Heart attack Father 73   Heart attack Sister 48   Diabetes Sister    Prior to Admission medications   Medication Sig Start Date End Date Taking? Authorizing Provider  acetaminophen (TYLENOL) 500 MG tablet Take 1 tablet (500 mg total) by mouth every 6 (six) hours as needed. 09/20/19   Fawze, Mina A, PA-C  albuterol (PROVENTIL HFA;VENTOLIN HFA) 108 (90 Base) MCG/ACT inhaler Inhale 2 puffs into the lungs every 6 (six) hours as needed for wheezing or shortness of breath. 08/21/18   Lorella Nimrod, MD  albuterol (PROVENTIL) (2.5 MG/3ML) 0.083% nebulizer solution Take 3 mLs (2.5 mg total) by nebulization every 6 (six) hours as needed for wheezing or shortness of breath. 12/17/19   Masoudi, Elhamalsadat, MD  amLODipine (NORVASC) 10 MG tablet Take 1 tablet (10 mg total) by mouth daily. 08/07/20   Riesa Pope, MD  budesonide-formoterol (SYMBICORT) 80-4.5 MCG/ACT inhaler INHALE TWO PUFFS BY MOUTH INTO LUNGS TWICE DAILY Patient not taking: Reported on 02/02/2019 08/21/18   Lorella Nimrod, MD  cetirizine (ZYRTEC) 10 MG tablet Take 10 mg by mouth daily.    [provider]  cholecalciferol (VITAMIN D) 1000 units tablet Take 1 tablet (1,000 Units total) by mouth daily. 11/18/16   Alphonzo Grieve, MD  cyclobenzaprine (FLEXERIL) 10 MG tablet Take 1 tablet (10 mg total) by mouth 2 (two) times daily as needed for muscle spasms. 09/20/19   Fawze, Mina A, PA-C  diclofenac sodium (VOLTAREN) 1 % GEL Apply 4 g topically 4 (four) times daily. Patient taking  differently: Apply 4 g topically 4 (four) times daily as needed (pain).  06/08/17   Swinteck, Aaron Edelman, MD  ferrous sulfate 325 (65 FE) MG tablet Take 1 tablet (325 mg total) by mouth daily. Patient not taking: Reported on 02/02/2019 08/11/16 08/11/17  Ledell Noss, MD  fluticasone [facility]) 50 MCG/ACT nasal spray USE TWO SPRAY(S) IN EACH NOSTRIL ONCE DAILY Patient taking differently: Place 2 sprays into both nostrils daily as needed for allergies or rhinitis.  08/05/16   Alphonzo Grieve, MD  hydrocortisone 2.5 % cream Apply to face at bedtime Monday/Wednesday/friday 05/19/20   Ralene Bathe, MD  ketoconazole (NIZORAL) 2 % cream Apply to face at bedtime Tuesday/Thursday/Saturday 05/19/20   Ralene Bathe, MD  traZODone (DESYREL) 50 MG tablet Take 1 tablet (50 mg total) by mouth at bedtime and may repeat dose one time if needed. 02/04/19   Derrill Center, NP  vitamin B-12 (CYANOCOBALAMIN) 1000 MCG tablet Take 1,000 mcg by mouth daily.    [provider]  Vitamin D, Ergocalciferol, (DRISDOL) 1.25 MG (50000 UT) CAPS capsule Take 1 capsule (50,000 Units total) by mouth every 7 (seven) days. Patient not taking: Reported on 02/02/2019 11/21/18   Lorella Nimrod, MD   Physical Exam: Vitals:   06/08/21 0830 06/08/21 0930 06/08/21 1030 06/08/21 1106  BP: (!) 150/82 136/67 139/75 137/68  Pulse: (!) 120 (!) 118 (!) 112 (!) 112  Resp: (!) 21 (!) 33 (!) 22 20  Temp:      TempSrc:      SpO2: 98% 95% 92% 97%   Constitutional: Acutely ill-appearing, but nontoxic. Eyes: PERRL, lids and conjunctivae normal.  Injected sclera bilaterally. ENMT: Mucous membranes and lips are dry.  Posterior pharynx clear of any exudate or lesions. Neck: normal, supple, no masses, no thyromegaly Respiratory: Mildly tachypneic.  Decreased breath sounds with bilateral wheezing and mild rhonchi, no crackles. No accessory muscle use.  Cardiovascular: Sinus tachycardia in the 110s, no murmurs / rubs / gallops. No extremity edema. 2+  pedal pulses. No carotid bruits.  Abdomen: Obese, positive RUQ and epigastric tenderness, no guarding or rebound, no masses palpated. No hepatosplenomegaly. Bowel sounds positive.  Musculoskeletal: Mild to moderate generalized weakness.  No clubbing / cyanosis. Good ROM, no contractures. Normal muscle tone.  Skin: no rashes, lesions, ulcers on limited dermatological examination. Neurologic: CN 2-12 grossly intact. Sensation intact, DTR normal. Strength 5/5 in all 4.  Psychiatric: Normal judgment and insight. Alert and oriented x 3. Normal mood.   Labs on Admission: I have personally reviewed following labs and imaging studies  CBC: Recent Labs  Lab 06/08/21 0311  WBC 11.1*  NEUTROABS 10.1*  HGB 10.5*  HCT 31.4*  MCV 85.8  PLT 856    Basic Metabolic Panel: Recent Labs  Lab 06/08/21 0311  NA 135  K 3.1*  CL 100  CO2 25  GLUCOSE 161*  BUN 12  CREATININE 0.67  CALCIUM 9.4    GFR: CrCl cannot be calculated (Unknown ideal weight.).  Liver Function Tests: Recent Labs  Lab 06/08/21 0311  AST 18  ALT 17  ALKPHOS 50  BILITOT 0.6  PROT 7.6  ALBUMIN 4.1   Radiological Exams on Admission: DG Chest 2 View  Result Date: 06/07/2021 CLINICAL DATA:  Cough EXAM: CHEST - 2 VIEW COMPARISON:  Chest x-ray 02/02/2019 FINDINGS: The heart and mediastinal contours are unchanged. Possible developing trace left lower lobe airspace opacity. No pulmonary edema. No pleural effusion. No pneumothorax. No acute osseous abnormality. Right upper quadrant surgical clips. IMPRESSION: Possible developing trace left lower lobe airspace opacity. Electronically Signed   By: Iven Finn M.D.   On: 06/07/2021 22:22    EKG: Independently reviewed.  EKG #1 Vent. rate 134 BPM PR interval 122 ms QRS duration 128 ms QT/QTcB 396/592 ms P-R-T axes 227 32 64 Sinus or ectopic atrial tachycardia Left bundle branch block  EKG #2 Vent. rate 121 BPM PR interval 146 ms QRS duration 128 ms QT/QTcB  353/501 ms P-R-T axes 100 59 -48 Sinus tachycardia IVCD, consider atypical LBBB  Assessment/Plan Principal Problem:   Asthma exacerbation In the setting:   Influenza A Observation/telemetry. Supplemental oxygen as needed. Continue bronchodilators. Methylprednisolone 40 mg IVP x1 dose. Begin Tamiflu 75 mg  p.o. twice daily. Continue IV hydration and electrolyte replacement.  Active Problems:   Hypokalemia Continue K replacement. Magnesium supplemented earlier. Follow-up potassium level.    Hypertension Hold amlodipine due to current tachycardia. Low-dose beta-blocker as needed.    Iron deficiency anemia Hold iron supplementation due to GI upset.    Class 1 obesity Lifestyle modifications. Follow-up with PCP.    Prediabetes Full liquid diet for the moment. CBG monitoring with RI SS. Check hemoglobin A1c.    Hyperlipidemia Currently not on medical therapy. Lifestyle modifications. Follow-up with PCP for further management.    GERD (gastroesophageal reflux disease) Famotidine 20 mg p.o. daily.   DVT prophylaxis: SCDs. Code Status:   Full code. Family Communication:   Disposition Plan:   Patient is from:  Home.  Anticipated DC to:  Home.  Anticipated DC date:  06/09/2021 or 06/09/2021.  Anticipated DC barriers: Clinical status.  Consults called:   Admission status:  Observation/telemetry.  High severity after presenting with acute influenza A with severe fatigue, malaise, pleuritic chest pain, dyspnea with wheezing, abdominal pain with multiple episodes of nausea and emesis.  The patient will remain in the hospital for IV hydration, electrolyte replacement, symptoms treatment and close monitoring.  Severity of Illness:  Reubin Milan MD [facility]  How to contact the [facility] [employer] Attending or Consulting provider [place] or covering provider during after hours [place], for this patient?   Check the care team in [facility] and look for a) attending/consulting [facility]  provider listed and b) the [facility] team listed Log into [email or web address] and use [place]'s universal password to access. If you do not have the password, please contact the hospital operator. Locate the [facility] provider you are looking for under [facility] and page to a number that you can be directly reached. If you still have difficulty reaching the provider, please page the [facility] (Director on Call) for the Hospitalists listed on amion for assistance.  06/08/2021, 1:07 PM   This document was prepared using [employer] and may contain some unintended transcription errors.

## 2021-06-09 ENCOUNTER — Inpatient Hospital Stay (HOSPITAL_COMMUNITY): Payer: Self-pay

## 2021-06-09 DIAGNOSIS — I10 Essential (primary) hypertension: Secondary | ICD-10-CM

## 2021-06-09 DIAGNOSIS — E876 Hypokalemia: Secondary | ICD-10-CM

## 2021-06-09 DIAGNOSIS — J45901 Unspecified asthma with (acute) exacerbation: Secondary | ICD-10-CM

## 2021-06-09 DIAGNOSIS — J189 Pneumonia, unspecified organism: Secondary | ICD-10-CM

## 2021-06-09 DIAGNOSIS — K219 Gastro-esophageal reflux disease without esophagitis: Secondary | ICD-10-CM

## 2021-06-09 LAB — CBC
HCT: 30.3 % — ABNORMAL LOW (ref 36.0–46.0)
Hemoglobin: 9.9 g/dL — ABNORMAL LOW (ref 12.0–15.0)
MCH: 28.4 pg (ref 26.0–34.0)
MCHC: 32.7 g/dL (ref 30.0–36.0)
MCV: 87.1 fL (ref 80.0–100.0)
Platelets: 229 10*3/uL (ref 150–400)
RBC: 3.48 MIL/uL — ABNORMAL LOW (ref 3.87–5.11)
RDW: 15.4 % (ref 11.5–15.5)
WBC: 10 10*3/uL (ref 4.0–10.5)
nRBC: 0 % (ref 0.0–0.2)

## 2021-06-09 LAB — VITAMIN B12: Vitamin B-12: 367 pg/mL (ref 180–914)

## 2021-06-09 LAB — IRON AND TIBC
Iron: 12 ug/dL — ABNORMAL LOW (ref 28–170)
Saturation Ratios: 3 % — ABNORMAL LOW (ref 10.4–31.8)
TIBC: 395 ug/dL (ref 250–450)
UIBC: 383 ug/dL

## 2021-06-09 LAB — GLUCOSE, CAPILLARY
Glucose-Capillary: 184 mg/dL — ABNORMAL HIGH (ref 70–99)
Glucose-Capillary: 144 mg/dL — ABNORMAL HIGH (ref 70–99)
Glucose-Capillary: 194 mg/dL — ABNORMAL HIGH (ref 70–99)
Glucose-Capillary: 231 mg/dL — ABNORMAL HIGH (ref 70–99)

## 2021-06-09 LAB — BASIC METABOLIC PANEL
Anion gap: 3 — ABNORMAL LOW (ref 5–15)
BUN: 11 mg/dL (ref 6–20)
CO2: 23 mmol/L (ref 22–32)
Calcium: 9.3 mg/dL (ref 8.9–10.3)
Chloride: 110 mmol/L (ref 98–111)
Creatinine, Ser: 0.47 mg/dL (ref 0.44–1.00)
GFR, Estimated: >60 mL/min (ref >60)
Glucose, Bld: 147 mg/dL — ABNORMAL HIGH (ref 70–99)
Potassium: 4 mmol/L (ref 3.5–5.1)
Sodium: 136 mmol/L (ref 135–145)

## 2021-06-09 LAB — RETICULOCYTES
Immature Retic Fract: 9.8 % (ref 2.3–15.9)
RBC.: 3.49 MIL/uL — ABNORMAL LOW (ref 3.87–5.11)
Retic Count, Absolute: 51 10*3/uL (ref 19.0–186.0)
Retic Ct Pct: 1.5 % (ref 0.4–3.1)

## 2021-06-09 LAB — HEMOGLOBIN A1C
Hgb A1c MFr Bld: 6 % — ABNORMAL HIGH (ref 4.8–5.6)
Mean Plasma Glucose: 125.5 mg/dL

## 2021-06-09 LAB — FERRITIN: Ferritin: 50 ng/mL (ref 11–307)

## 2021-06-09 LAB — FOLATE: Folate: 12.2 ng/mL (ref >5.9)

## 2021-06-09 LAB — HIV ANTIBODY (ROUTINE TESTING W REFLEX): HIV Screen 4th Generation wRfx: NONREACTIVE

## 2021-06-09 MED ORDER — LEVOFLOXACIN IN D5W 500 MG/100ML IV SOLN
500.0000 mg | Freq: Every day | INTRAVENOUS | Status: DC
  Administered 2021-06-09 – 2021-06-10 (×2): 500 mg via INTRAVENOUS
  Filled 2021-06-09 (×3): qty 100

## 2021-06-09 MED ORDER — PANTOPRAZOLE SODIUM 40 MG IV SOLR
40.0000 mg | Freq: Every day | INTRAVENOUS | Status: AC
  Administered 2021-06-09 – 2021-06-10 (×2): 40 mg via INTRAVENOUS
  Filled 2021-06-09 (×2): qty 40

## 2021-06-09 MED ORDER — MORPHINE SULFATE (PF) 2 MG/ML IV SOLN
0.5000 mg | INTRAVENOUS | Status: DC | PRN
  Administered 2021-06-09 – 2021-06-12 (×10): 0.5 mg via INTRAVENOUS
  Filled 2021-06-09 (×10): qty 1

## 2021-06-09 MED ORDER — PREDNISONE 20 MG PO TABS
40.0000 mg | ORAL_TABLET | Freq: Every day | ORAL | Status: AC
  Administered 2021-06-09 – 2021-06-11 (×3): 40 mg via ORAL
  Filled 2021-06-09 (×3): qty 2

## 2021-06-09 MED ORDER — SODIUM CHLORIDE 0.9 % IV SOLN: 500.0000 mg | INTRAVENOUS | Status: DC

## 2021-06-09 MED ORDER — BENZONATATE 100 MG PO CAPS
200.0000 mg | ORAL_CAPSULE | Freq: Three times a day (TID) | ORAL | Status: DC | PRN
  Administered 2021-06-09 – 2021-06-10 (×2): 200 mg via ORAL
  Filled 2021-06-09 (×2): qty 2

## 2021-06-09 MED ORDER — GUAIFENESIN-DM 100-10 MG/5ML PO SYRP
10.0000 mL | ORAL_SOLUTION | ORAL | Status: DC | PRN
  Administered 2021-06-09 – 2021-06-13 (×10): 10 mL via ORAL
  Filled 2021-06-09 (×10): qty 10

## 2021-06-09 MED ORDER — SODIUM CHLORIDE 0.9 % IV SOLN: 1.0000 g | INTRAVENOUS | Status: DC

## 2021-06-09 NOTE — Progress Notes (Signed)
PROGRESS NOTE    [name]  VCB:[id number] DOB: Dec 26, 1965 DOA: 06/07/2021 PCP: Gaylan Gerold, DO   Chief Complaint  Patient presents with   Generalized Body Aches   Chest Pain   Emesis   Cough   Chills    Brief Narrative:   [name] is a 55 y.o. female with medical history significant of anemia, mild intermittent asthma, left hip bursitis, GERD, hiatal hernia, history of gastric PUD, hypertension, migraine headaches, class I obesity presents to the emergency department with nauseam vomiting, chest pain and sob and cough.  CXR shows Possible developing trace left lower lobe airspace opacity. She was found to have left lower lobe opacity on CXR and influenza A .   Assessment & Plan:   Principal Problem:   Influenza A Active Problems:   Hypertension   Iron deficiency anemia   Hypokalemia   Class 1 obesity   Prediabetes   Hyperlipidemia   GERD (gastroesophageal reflux disease)   Asthma exacerbation   Left lower lobe pneumonia:  Start her on IV Levaquin as she had severe reaction to penicillins and hasn't used cephalosporins in the past.  Check urine for strep and legionella antigen.  [place] oxygen to keep sats greater than 90%.    Mild hemoptysis earlier this am.  Probably from acute bronchitis. In view of her left lower lobe opacity will get CT chest without contrast for further evaluation.  Pt reports dysphagia from childhood, will get SLP eval for further evaluation.    Influenza A/ Mild Asthma exacerbation: Scattered wheezing heard on exam.  Continue with Tamiflu.  Quick taper of prednisone.    Hypokalemia:  Replaced.    Hypertension:  well controlled.   GERD:  Severe as per the patient, will start her on IV protonix.     Nausea, vomiting , Abdominal pain.  Symptomatic management with anti emetics, cough meds and pain control.    Normocytic anemia: Anemia panel will be ordered.    DVT prophylaxis: scd's Code Status: full code.  Family  Communication: none at bedside.  Disposition:   Status is: Observation  The patient will require care spanning > 2 midnights and should be moved to inpatient because: IV antibiotics, IV fluids,       Consultants:  None.   Procedures: none.   Antimicrobials:  Antibiotics Given (last 72 hours)     Date/Time Action Medication Dose Rate   06/08/21 0927 New Bag/Given   levofloxacin (LEVAQUIN) IVPB 750 mg 750 mg 100 mL/hr   06/08/21 2137 Given   oseltamivir (TAMIFLU) capsule 75 mg 75 mg    06/09/21 0915 Given   oseltamivir (TAMIFLU) capsule 75 mg 75 mg          Subjective: Reports feeling miserable, coughing some blood earlier this am,  Nauseated, some abd pain.    Objective: Vitals:   06/08/21 1936 06/08/21 2338 06/09/21 0334 06/09/21 0744  BP: 128/77 133/63 117/69 138/70  Pulse: 93 93 80 88  Resp: 20 18 16 18   Temp: 99.9 F (37.7 C) 100.2 F (37.9 C) 98.8 F (37.1 C) 99.1 F (37.3 C)  TempSrc: Oral Oral Oral Oral  SpO2: 100% 100% 100% 100%  Weight:      Height:        Intake/Output Summary (Last 24 hours) at 06/09/2021 1227 Last data filed at 06/09/2021 0400 Gross per 24 hour  Intake 1426.58 ml  Output --  Net 1426.58 ml   Filed Weights   06/08/21 1615  Weight:  79.4 kg    Examination:  General exam: WELL DEVELOPed lady, in mild distress from coughing and nausea,  Respiratory system: scattered wheezing heard, tachypnea present, on RA.  Cardiovascular system: S1 & S2 heard, RRR. No JVD,  No pedal edema. Gastrointestinal system: Abdomen is nondistended, soft and nontender.  Normal bowel sounds heard. Central nervous system: Alert and oriented. No focal neurological deficits. Extremities: Symmetric 5 x 5 power. Skin: No rashes, lesions or ulcers Psychiatry: anxious.     Data Reviewed: I have personally reviewed following labs and imaging studies  CBC: Recent Labs  Lab 06/08/21 0311 06/09/21 0514  WBC 11.1* 10.0  NEUTROABS 10.1*  --   HGB  10.5* 9.9*  HCT 31.4* 30.3*  MCV 85.8 87.1  PLT 244 109    Basic Metabolic Panel: Recent Labs  Lab 06/08/21 0311 06/09/21 0514  NA 135 136  K 3.1* 4.0  CL 100 110  CO2 25 23  GLUCOSE 161* 147*  BUN 12 11  CREATININE 0.67 0.47  CALCIUM 9.4 9.3    GFR: Estimated Creatinine Clearance: 77.5 mL/min (by C-G formula based on SCr of 0.47 mg/dL).  Liver Function Tests: Recent Labs  Lab 06/08/21 0311  AST 18  ALT 17  ALKPHOS 50  BILITOT 0.6  PROT 7.6  ALBUMIN 4.1    CBG: Recent Labs  Lab 06/08/21 1724 06/08/21 1956 06/09/21 0742 06/09/21 1133  GLUCAP 140* 180* 184* 144*     Recent Results (from the past 240 hour(s))  Resp Panel by RT-PCR (Flu A&B, Covid) Nasopharyngeal Swab     Status: Abnormal   Collection Time: 06/07/21  9:54 PM   Specimen: Nasopharyngeal Swab; Nasopharyngeal(NP) swabs in vial transport medium  Result Value Ref Range Status   SARS Coronavirus 2 by RT PCR NEGATIVE NEGATIVE Final    Comment: (NOTE) SARS-CoV-2 target nucleic acids are NOT DETECTED.  The SARS-CoV-2 RNA is generally detectable in upper respiratory specimens during the acute phase of infection. The lowest concentration of SARS-CoV-2 viral copies this assay can detect is 138 copies/mL. A negative result does not preclude SARS-Cov-2 infection and should not be used as the sole basis for treatment or other patient management decisions. A negative result may occur with  improper specimen collection/handling, submission of specimen other than nasopharyngeal swab, presence of viral mutation(s) within the areas targeted by this assay, and inadequate number of viral copies(<138 copies/mL). A negative result must be combined with clinical observations, patient history, and epidemiological information. The expected result is Negative.  Fact Sheet for Patients:  EntrepreneurPulse.com.au  Fact Sheet for Healthcare Providers:   IncredibleEmployment.be  This test is no t yet approved or cleared by the [place] FDA and  has been authorized for detection and/or diagnosis of SARS-CoV-2 by FDA under an Emergency Use Authorization (EUA). This EUA will remain  in effect (meaning this test can be used) for the duration of the COVID-19 declaration under Section 564(b)(1) of the Act, 21 U.S.C.section 360bbb-3(b)(1), unless the authorization is terminated  or revoked sooner.       Influenza A by PCR POSITIVE (A) NEGATIVE Final   Influenza B by PCR NEGATIVE NEGATIVE Final    Comment: (NOTE) The Xpert Xpress SARS-CoV-2/FLU/RSV plus assay is intended as an aid in the diagnosis of influenza from Nasopharyngeal swab specimens and should not be used as a sole basis for treatment. Nasal washings and aspirates are unacceptable for Xpert Xpress SARS-CoV-2/FLU/RSV testing.  Fact Sheet for Patients: EntrepreneurPulse.com.au  Fact Sheet for Healthcare Providers:  IncredibleEmployment.be  This test is not yet approved or cleared by the [place] and has been authorized for detection and/or diagnosis of SARS-CoV-2 by FDA under an Emergency Use Authorization (EUA). This EUA will remain in effect (meaning this test can be used) for the duration of the COVID-19 declaration under Section 564(b)(1) of the Act, 21 U.S.C. section 360bbb-3(b)(1), unless the authorization is terminated or revoked.  Performed at [facility], [place] [address], [place], [place] [address]          Radiology Studies: DG Chest 2 View  Result Date: 06/07/2021 CLINICAL DATA:  Cough EXAM: CHEST - 2 VIEW COMPARISON:  Chest x-ray 02/02/2019 FINDINGS: The heart and mediastinal contours are unchanged. Possible developing trace left lower lobe airspace opacity. No pulmonary edema. No pleural effusion. No pneumothorax. No acute osseous abnormality. Right upper quadrant  surgical clips. IMPRESSION: Possible developing trace left lower lobe airspace opacity. Electronically Signed   By: Iven Finn M.D.   On: 06/07/2021 22:22        Scheduled Meds:  amLODipine  10 mg Oral Daily   famotidine  20 mg Oral Daily   insulin aspart  0-20 Units Subcutaneous TID WC   loratadine  10 mg Oral Daily   oseltamivir  75 mg Oral BID   pantoprazole (PROTONIX) IV  40 mg Intravenous Daily   predniSONE  40 mg Oral QAC breakfast   Continuous Infusions:  0.9 % NaCl with KCl 20 mEq / L Stopped (06/09/21 0000)     LOS: 0 days        Hosie Poisson, MD [facility]   To contact the attending provider between 7A-7P or the covering provider during after hours 7P-7A, please log into the web site [email or web address] and access using universal [place] password for that web site. If you do not have the password, please call the hospital operator.  06/09/2021, 12:27 PM

## 2021-06-10 DIAGNOSIS — R0789 Other chest pain: Secondary | ICD-10-CM

## 2021-06-10 DIAGNOSIS — I4729 Other ventricular tachycardia: Secondary | ICD-10-CM

## 2021-06-10 LAB — MAGNESIUM: Magnesium: 1.8 mg/dL (ref 1.7–2.4)

## 2021-06-10 LAB — CBC
HCT: 32.7 % — ABNORMAL LOW (ref 36.0–46.0)
Hemoglobin: 10.8 g/dL — ABNORMAL LOW (ref 12.0–15.0)
MCH: 28.6 pg (ref 26.0–34.0)
MCHC: 33 g/dL (ref 30.0–36.0)
MCV: 86.7 fL (ref 80.0–100.0)
Platelets: 267 10*3/uL (ref 150–400)
RBC: 3.77 MIL/uL — ABNORMAL LOW (ref 3.87–5.11)
RDW: 15.3 % (ref 11.5–15.5)
WBC: 8 10*3/uL (ref 4.0–10.5)
nRBC: 0 % (ref 0.0–0.2)

## 2021-06-10 LAB — BASIC METABOLIC PANEL
Anion gap: 8 (ref 5–15)
BUN: 8 mg/dL (ref 6–20)
CO2: 24 mmol/L (ref 22–32)
Calcium: 9.4 mg/dL (ref 8.9–10.3)
Chloride: 104 mmol/L (ref 98–111)
Creatinine, Ser: 0.54 mg/dL (ref 0.44–1.00)
GFR, Estimated: >60 mL/min (ref >60)
Glucose, Bld: 231 mg/dL — ABNORMAL HIGH (ref 70–99)
Potassium: 3.7 mmol/L (ref 3.5–5.1)
Sodium: 136 mmol/L (ref 135–145)

## 2021-06-10 LAB — STREP PNEUMONIAE URINARY ANTIGEN: Strep Pneumo Urinary Antigen: NEGATIVE

## 2021-06-10 LAB — GLUCOSE, CAPILLARY
Glucose-Capillary: 129 mg/dL — ABNORMAL HIGH (ref 70–99)
Glucose-Capillary: 200 mg/dL — ABNORMAL HIGH (ref 70–99)
Glucose-Capillary: 215 mg/dL — ABNORMAL HIGH (ref 70–99)
Glucose-Capillary: 189 mg/dL — ABNORMAL HIGH (ref 70–99)

## 2021-06-10 LAB — TROPONIN I (HIGH SENSITIVITY)
Troponin I (High Sensitivity): 9 ng/L (ref <18)
Troponin I (High Sensitivity): 8 ng/L (ref <18)

## 2021-06-10 MED ORDER — ENOXAPARIN SODIUM 40 MG/0.4ML IJ SOSY
40.0000 mg | PREFILLED_SYRINGE | INTRAMUSCULAR | Status: DC
  Administered 2021-06-11 – 2021-06-13 (×3): 40 mg via SUBCUTANEOUS
  Filled 2021-06-10 (×3): qty 0.4

## 2021-06-10 MED ORDER — NITROGLYCERIN 0.4 MG SL SUBL
0.4000 mg | SUBLINGUAL_TABLET | SUBLINGUAL | Status: DC | PRN
  Administered 2021-06-10: 0.4 mg via SUBLINGUAL
  Filled 2021-06-10: qty 1

## 2021-06-10 MED ORDER — POTASSIUM CHLORIDE CRYS ER 20 MEQ PO TBCR
40.0000 meq | EXTENDED_RELEASE_TABLET | Freq: Once | ORAL | Status: AC
  Administered 2021-06-10: 40 meq via ORAL
  Filled 2021-06-10: qty 2

## 2021-06-10 MED ORDER — MAGNESIUM SULFATE 2 GM/50ML IV SOLN
2.0000 g | Freq: Once | INTRAVENOUS | Status: AC
  Administered 2021-06-10: 2 g via INTRAVENOUS
  Filled 2021-06-10: qty 50

## 2021-06-10 MED ORDER — OXYCODONE HCL 5 MG PO TABS
5.0000 mg | ORAL_TABLET | Freq: Four times a day (QID) | ORAL | Status: DC | PRN
  Administered 2021-06-10 – 2021-06-12 (×2): 5 mg via ORAL
  Filled 2021-06-10 (×3): qty 1

## 2021-06-10 MED ORDER — ALUM & MAG HYDROXIDE-SIMETH 200-200-20 MG/5ML PO SUSP: 30.0000 mL | Freq: Four times a day (QID) | ORAL | Status: DC | PRN

## 2021-06-10 MED ORDER — BENZONATATE 100 MG PO CAPS
200.0000 mg | ORAL_CAPSULE | Freq: Three times a day (TID) | ORAL | Status: DC
  Administered 2021-06-11 – 2021-06-14 (×9): 200 mg via ORAL
  Filled 2021-06-10 (×11): qty 2

## 2021-06-10 NOTE — Evaluation (Signed)
Clinical/Bedside Swallow Evaluation Patient Details  Name: Dresden L Newland MRN: [id number] Date of Birth: 1966/01/01  Today's Date: 06/10/2021 Time: SLP Start Time (ACUTE ONLY): 0945 SLP Stop Time (ACUTE ONLY): 1005 SLP Time Calculation (min) (ACUTE ONLY): 20 min  Past Medical History:  Past Medical History:  Diagnosis Date   Anemia    Asthma    Bursitis of left hip    Decreased appetite    GERD (gastroesophageal reflux disease)    History of hiatal hernia    repaired   History of stomach ulcers    Hypertension    Migraine    "maybe twice in the last 10 yrs" (10/16/2013)   PONV (postoperative nausea and vomiting)    Shortness of breath dyspnea    Past Surgical History:  Past Surgical History:  Procedure Laterality Date   BARTHOLIN GLAND CYST EXCISION     BREAST LUMPECTOMY WITH RADIOACTIVE SEED LOCALIZATION Left 01/23/2016   Procedure: BREAST LUMPECTOMY WITH RADIOACTIVE SEED LOCALIZATION;  Surgeon: Jackolyn Confer, MD;  Location: [place];  Service: General;  Laterality: Left;   [place]  ~ [place]  07/2003   HERNIA REPAIR     INCISION AND DRAINAGE ABSCESS  10/2005   Bartholin abscess.   TONSILLECTOMY AND ADENOIDECTOMY  1983   TOTAL HIP ARTHROPLASTY Right 06/05/2017   TOTAL HIP ARTHROPLASTY Right 06/06/2017   Procedure: RIGHT TOTAL HIP ARTHROPLASTY ANTERIOR APPROACH;  Surgeon: Rod Can, MD;  Location: [place];  Service: Orthopedics;  Laterality: Right;  Needs RNFA   TUBAL LIGATION  1994   HPI:  Pt is a 55 yo female adm to [facility] with N/V, fever, dyspnea, sore throat = diagnosed with flu.  Pt with PMH + for GERD, hiatal hernia, migraine, left hip bursitis, radioactive seeds placed in her left breast previously. She reported to MD problems swallowing pills and SLP eval was ordered.  Pt reports to this SLP that she has had problems with pills since she was a child.   CXR concerning for potential pna - left lower.    Assessment  / Plan / Recommendation  Clinical Impression  Pt with functional oropharyngeal swallow ability but has large pill dysphagia that she reports present since childhood. SLP advised her to option to try including taking pill with more cohesive foods to prevent separation *that pt reports.  Pt easily passed 3 ounce [place] and tolerated intake of fruit minced well.  No indications of pharyngeal deficits.  Recommend advance to regular consistency - SLP provided pt with reflux precautions given known hx. No SLP follow up needed. Thanks. SLP Visit Diagnosis: Dysphagia, unspecified (R13.10)    Aspiration Risk  No limitations    Diet Recommendation Regular;Thin liquid   Liquid Administration via: Cup;Straw Medication Administration: Other (Comment) (try with pudding or viscous foods *peanut butter/bread) Supervision: Patient able to self feed Compensations: Slow rate;Small sips/bites Postural Changes: Seated upright at 90 degrees;Remain upright for at least 30 minutes after po intake    Other  Recommendations Oral Care Recommendations: Oral care BID    Recommendations for follow up therapy are one component of a multi-disciplinary discharge planning process, led by the attending physician.  Recommendations may be updated based on patient status, additional functional criteria and insurance authorization.  Follow up Recommendations No SLP follow up      Assistance Recommended at Discharge None  Functional Status Assessment    Frequency and Duration   N/a  Prognosis    N/a    Swallow Study   General Date of Onset: 06/10/21 HPI: Pt is a 55 yo female adm to [facility] with N/V, fever, dyspnea, sore throat = diagnosed with flu.  Pt with PMH + for GERD, hiatal hernia, migraine, left hip bursitis, radioactive seeds placed in her left breast previously. She reported to MD problems swallowing pills and SLP eval was ordered.  Pt reports to this SLP that she has had problems with pills since she was a  child.   CXR concerning for potential pna - left lower. Type of Study: Bedside Swallow Evaluation Diet Prior to this Study: Dysphagia 3 (soft);Thin liquids Temperature Mctigue Noted: No Respiratory Status: Nasal cannula History of Recent Intubation: No Behavior/Cognition: Alert;Cooperative;Pleasant mood Oral Cavity Assessment: Within Functional Limits Oral Care Completed by SLP: No Oral Cavity - Dentition: Adequate natural dentition Vision: Functional for self-feeding Self-Feeding Abilities: Able to feed self Patient Positioning: Upright in bed Baseline Vocal Quality: Hoarse (suspect due to coughing) Volitional Cough: Other (Comment) (DNT) Volitional Swallow: Able to elicit    Oral/Motor/Sensory Function Overall Oral Motor/Sensory Function: Within functional limits   Ice Chips Ice chips: Not tested   Thin Liquid Thin Liquid: Within functional limits Presentation: Cup;Straw    Nectar Thick Nectar Thick Liquid: Not tested   Honey Thick Honey Thick Liquid: Not tested   Puree Puree: Not tested   Solid     Solid: Within functional limits Presentation: Self Fed;Spoon      Macario Golds 06/10/2021,11:06 AM    Kathleen Lime, MS [facility] SLP Acute Rehab Services Office [phone] Pager [phone]

## 2021-06-10 NOTE — Progress Notes (Addendum)
PROGRESS NOTE    [name]  TDV:[id number] DOB: February 15, 1966 DOA: 06/07/2021 PCP: Gaylan Gerold, DO   Chief Complaint  Patient presents with   Generalized Body Aches   Chest Pain   Emesis   Cough   Chills    Brief Narrative:   [name] is a 55 y.o. female with medical history significant of anemia, mild intermittent asthma, left hip bursitis, GERD, hiatal hernia, history of gastric PUD, hypertension, migraine headaches, class I obesity presents to the emergency department with nausea, vomiting, chest pain, sob and cough. CXR showed possible developing trace left lower lobe airspace opacity.  CT chest 12/6 confirms consolidative airspace opacities.  Treating for pneumonia and influenza a.  Assessment & Plan:   Principal Problem:   Influenza A Active Problems:   Hypertension   Iron deficiency anemia   Hypokalemia   Class 1 obesity   Prediabetes   Hyperlipidemia   GERD (gastroesophageal reflux disease)   Asthma exacerbation   Community-acquired pneumonia complicating influenza A: - CT chest 12/6: Bilateral heterogeneous groundglass and consolidative airspace opacity, most conspicuously in the lower lobes but also seen in the right upper and middle lobes consistent with multifocal infection and/or aspiration. -Urine pneumococcal antigen negative.  Urine Legionella antigen pending. - Start her on IV Levaquin 12/5 as she had severe reaction to penicillins and hasn't used cephalosporins in the past.  Given her NSVT, discussed with pharmacy regarding alternate choices of antibiotics.  However EKG has normal QTC.  Has completed 3 doses thus far.  Will reassess prior to a.m. dose of levofloxacin. -Currently on 2 L/min [place] oxygen, wean as tolerated. [place] oxygen to keep sats greater than 90%.   Mild hemoptysis 10/6.  Probably from acute bronchitis and multifocal pneumonia.  In view of her left lower lobe opacity will get CT chest without contrast for further evaluation was  obtained and results as above.  Pt reports dysphagia from childhood, speech therapy evaluated and recommend regular diet and thin liquids Resolved  Influenza A/ Mild Asthma exacerbation: Continue with Tamiflu and complete course.  Quick taper of prednisone. Supportive treatment with antitussives, as needed bronchodilator nebs, pain management  Atypical chest pain Likely musculoskeletal due to coughing from respiratory illness as noted above.  Other differential, GERD.  Low index of suspicion for ACS or PE at this time. Getting EKG, trend high-sensitivity troponin.  Checking 2D echo given report of 12 beat NSVT this afternoon.  Echo in 2018 had normal EF.  EKG personally reviewed, NSR at 64 bpm, normal axis, no acute abnormalities.  QTc 420 ms.  High-sensitivity troponin x1: Negative.  Trial of sublingual NTG, Maalox  NSVT Central telemetry reported a 12 beat NSVT, asymptomatic Replacing potassium and magnesium, aim to keep potassium >4 and magnesium >2. Check 2D echo.  Continue telemetry.  Hypokalemia:  Potassium 3.7, replace as noted above.  Magnesium 1.8, replace and follow.  Hypertension:   well controlled.   GERD/small hiatal hernia on CT chest:  Severe as per the patient, continue Protonix.  Added Maalox/Mylanta.  Nausea, vomiting, Abdominal pain.  Symptomatic management with anti emetics, cough meds and pain control.  May be related to her influenza.  Seems to have improved.  Normocytic anemia/iron deficiency: Hemoglobin gradually drifting down in the absence of overt bleeding. Anemia panel: Iron 12, TIBC 395, saturation ratio 3, ferritin 50, folate 12.2, B12: 367 and absolute reticulocyte count 51 Start ferrous sulfate as outpatient. Trend daily CBC.  Constipation Initiated bowel regimen.  Prediabetes A1c  6.  Mild hyperglycemia related to steroids and should resolve after stopping.   DVT prophylaxis: scd's.  Added Lovenox for DVT prophylaxis Code Status: full  code.  Family Communication: Discussed with patient's brother via phone, updated care and answered questions Disposition:   Status is: Inpatient  The patient will require care spanning > 2 midnights and should be moved to inpatient because: IV antibiotics, IV fluids,       Consultants:  None.   Procedures: none.   Antimicrobials:  Antibiotics Given (last 72 hours)     Date/Time Action Medication Dose Rate   06/08/21 0927 New Bag/Given   levofloxacin (LEVAQUIN) IVPB 750 mg 750 mg 100 mL/hr   06/08/21 2137 Given   oseltamivir (TAMIFLU) capsule 75 mg 75 mg    06/09/21 0915 Given   oseltamivir (TAMIFLU) capsule 75 mg 75 mg    06/09/21 1538 New Bag/Given   levofloxacin (LEVAQUIN) IVPB 500 mg 500 mg 100 mL/hr   06/09/21 2254 Given   oseltamivir (TAMIFLU) capsule 75 mg 75 mg    06/10/21 0911 Given   oseltamivir (TAMIFLU) capsule 75 mg 75 mg    06/10/21 1048 New Bag/Given   levofloxacin (LEVAQUIN) IVPB 500 mg 500 mg 100 mL/hr         Subjective: Reports chest pain, unable to clearly state if it is worse with coughing or not.  Not worse with deep inspiration.?  Reproducible to palpation.  Comes and goes.  Resolves with pain meds.  To RN, reported abdominal pain.?  Hematuria.  Denies home oxygen or smoking.  Also complains of left earache without drainage.  Constipation with last BM several days ago when asking for laxatives  Following second episode of NSVT, proceeded to bedside and evaluated patient with her female RN and rapid response RN at bedside.  Reports precordial chest pain/pressure, intermittent, 8/10 at its worst,?  Radiation to left axilla, spontaneously resolves and recurs.  Feels this is not like her usual reflux symptoms.  Denies prior history of MI or CAD.  Denies having stress test cardiac cath.  States her father passed away at 52 from a "heart attack".  Objective: Vitals:   06/10/21 1551 06/10/21 1553 06/10/21 1554 06/10/21 1557  BP: (!) 151/75 137/82 139/83  (!) 142/79  Pulse: 68 82 76 80  Resp: 18     Temp: 98.8 F (37.1 C)     TempSrc: Oral     SpO2: 100% 100% 100% 100%  Weight:      Height:       No intake or output data in the 24 hours ending 06/10/21 1717  Filed Weights   06/08/21 1615  Weight: 79.4 kg    Examination:  General exam: [place] female, moderately built and nourished sitting up comfortably in bed and was speaking with someone on her cell phone when I walked in. Respiratory system: Slightly harsh breath sounds bilaterally but no overt wheezing, rhonchi or crackles.  No increased work of breathing.?  Reproducible precordial chest wall tenderness. Cardiovascular system: S1 & S2 heard, RRR. No JVD,  No pedal edema.  Telemetry personally reviewed: Sinus rhythm.  2 episodes of 12 beat NSVT spread across 1 hour. Gastrointestinal system: Abdomen is nondistended, soft and nontender.  Normal bowel sounds heard. Central nervous system: Alert and oriented. No focal neurological deficits. Extremities: Symmetric 5 x 5 power. Skin: No rashes, lesions or ulcers Psychiatry: Appropriate Ears: Otoscopy left ear without acute findings.  Right ear difficult to fully examine due to cerumen in  the external auditory canal but did not see any appreciable acute findings.    Data Reviewed: I have personally reviewed following labs and imaging studies  CBC: Recent Labs  Lab 06/08/21 0311 06/09/21 0514 06/10/21 1611  WBC 11.1* 10.0 8.0  NEUTROABS 10.1*  --   --   HGB 10.5* 9.9* 10.8*  HCT 31.4* 30.3* 32.7*  MCV 85.8 87.1 86.7  PLT 244 229 867    Basic Metabolic Panel: Recent Labs  Lab 06/08/21 0311 06/09/21 0514 06/10/21 1611  NA 135 136 136  K 3.1* 4.0 3.7  CL 100 110 104  CO2 25 23 24   GLUCOSE 161* 147* 231*  BUN 12 11 8   CREATININE 0.67 0.47 0.54  CALCIUM 9.4 9.3 9.4  MG  --   --  1.8    GFR: Estimated Creatinine Clearance: 77.5 mL/min (by C-G formula based on SCr of 0.54 mg/dL).  Liver Function Tests: Recent Labs   Lab 06/08/21 0311  AST 18  ALT 17  ALKPHOS 50  BILITOT 0.6  PROT 7.6  ALBUMIN 4.1    CBG: Recent Labs  Lab 06/09/21 1710 06/09/21 2135 06/10/21 0756 06/10/21 1215 06/10/21 1636  GLUCAP 194* 231* 129* 200* 215*     Recent Results (from the past 240 hour(s))  Resp Panel by RT-PCR (Flu A&B, Covid) Nasopharyngeal Swab     Status: Abnormal   Collection Time: 06/07/21  9:54 PM   Specimen: Nasopharyngeal Swab; Nasopharyngeal(NP) swabs in vial transport medium  Result Value Ref Range Status   SARS Coronavirus 2 by RT PCR NEGATIVE NEGATIVE Final    Comment: (NOTE) SARS-CoV-2 target nucleic acids are NOT DETECTED.  The SARS-CoV-2 RNA is generally detectable in upper respiratory specimens during the acute phase of infection. The lowest concentration of SARS-CoV-2 viral copies this assay can detect is 138 copies/mL. A negative result does not preclude SARS-Cov-2 infection and should not be used as the sole basis for treatment or other patient management decisions. A negative result may occur with  improper specimen collection/handling, submission of specimen other than nasopharyngeal swab, presence of viral mutation(s) within the areas targeted by this assay, and inadequate number of viral copies(<138 copies/mL). A negative result must be combined with clinical observations, patient history, and epidemiological information. The expected result is Negative.  Fact Sheet for Patients:  EntrepreneurPulse.com.au  Fact Sheet for Healthcare Providers:  IncredibleEmployment.be  This test is no t yet approved or cleared by the [place] FDA and  has been authorized for detection and/or diagnosis of SARS-CoV-2 by FDA under an Emergency Use Authorization (EUA). This EUA will remain  in effect (meaning this test can be used) for the duration of the COVID-19 declaration under Section 564(b)(1) of the Act, 21 U.S.C.section 360bbb-3(b)(1), unless  the authorization is terminated  or revoked sooner.       Influenza A by PCR POSITIVE (A) NEGATIVE Final   Influenza B by PCR NEGATIVE NEGATIVE Final    Comment: (NOTE) The Xpert Xpress SARS-CoV-2/FLU/RSV plus assay is intended as an aid in the diagnosis of influenza from Nasopharyngeal swab specimens and should not be used as a sole basis for treatment. Nasal washings and aspirates are unacceptable for Xpert Xpress SARS-CoV-2/FLU/RSV testing.  Fact Sheet for Patients: EntrepreneurPulse.com.au  Fact Sheet for Healthcare Providers: IncredibleEmployment.be  This test is not yet approved or cleared by the [place] FDA and has been authorized for detection and/or diagnosis of SARS-CoV-2 by FDA under an Emergency Use Authorization (EUA). This EUA will remain  in effect (meaning this test can be used) for the duration of the COVID-19 declaration under Section 564(b)(1) of the Act, 21 U.S.C. section 360bbb-3(b)(1), unless the authorization is terminated or revoked.  Performed at [facility], [place] [address], [place], [place] [address]          Radiology Studies: CT CHEST WO CONTRAST  Result Date: 06/09/2021 CLINICAL DATA:  Hemoptysis EXAM: CT CHEST WITHOUT CONTRAST TECHNIQUE: Multidetector CT imaging of the chest was performed following the standard protocol without IV contrast. COMPARISON:  None. FINDINGS: Cardiovascular: Aortic atherosclerosis. Mild cardiomegaly. Trace pericardial effusion. Mediastinum/Nodes: No enlarged mediastinal, hilar, or axillary lymph nodes. Small hiatal hernia. Thyroid gland, trachea, and esophagus demonstrate no significant findings. Lungs/Pleura: There is bilateral heterogeneous ground-glass and consolidative airspace opacity, most conspicuously in the lower lobes (series 5, image 95) but also seen in the right upper and right middle lobes. No pleural effusion or pneumothorax. Upper Abdomen: No  acute abnormality. Benign, fatty adenomatous thickening of the bilateral adrenal glands. Musculoskeletal: No chest wall mass or suspicious bone lesions identified. IMPRESSION: 1. There is bilateral heterogeneous ground-glass and consolidative airspace opacity, most conspicuously in the lower lobes but also seen in the right upper and right middle lobes. Findings are consistent with multifocal infection and/or aspiration. 2. Small hiatal hernia, which may place the patient at risk for aspiration. Aortic Atherosclerosis (ICD10-I70.0). Electronically Signed   By: Delanna Ahmadi M.D.   On: 06/09/2021 14:34        Scheduled Meds:  amLODipine  10 mg Oral Daily   benzonatate  200 mg Oral TID   famotidine  20 mg Oral Daily   insulin aspart  0-20 Units Subcutaneous TID WC   loratadine  10 mg Oral Daily   oseltamivir  75 mg Oral BID   potassium chloride  40 mEq Oral Once   predniSONE  40 mg Oral QAC breakfast   Continuous Infusions:  levofloxacin (LEVAQUIN) IV 500 mg (06/10/21 1048)   magnesium sulfate bolus IVPB       LOS: 1 day        Vernell Leep, MD [facility]   To contact the attending provider between 7A-7P or the covering provider during after hours 7P-7A, please log into the web site [email or web address] and access using universal [place] password for that web site. If you do not have the password, please call the hospital operator.  06/10/2021, 5:17 PM

## 2021-06-10 NOTE — Progress Notes (Signed)
PT Cancellation Note  Patient Details Name: Pachia L Casares MRN: [id number] DOB: August 19, 1965   Cancelled Treatment:    Reason Eval/Treat Not Completed: Patient declined, no reason specified (PT educating pt on PT role in hospital setting. Pt deferring PT eval today stating that she has been moving around in room and just got back in bed to sleep. PT will follow up as schedule allows.)   Festus Barren., PT, DPT  Acute Rehabilitation Services  Office [phone] 06/10/2021, 4:23 PM

## 2021-06-10 NOTE — Plan of Care (Signed)

## 2021-06-10 NOTE — Progress Notes (Signed)
Pharmacy Brief Note - Enoxaparin Dosing:  Pharmacy consulted to dose enoxaparin for DVT ppx.   Assessment: TBW = 79 kg SCr WNL CBC: Hgb low but stable, Plt WNL  Plan: Enoxaparin 40 mg subcutaneously q24h CBC with AM labs tomorrow  Pharmacy to sign off. Please re-consult if needed.   Lenis Noon, PharmD 06/10/21 5:59 PM

## 2021-06-10 NOTE — Progress Notes (Signed)
Phone call received from telemetry. Patient noted to have a 12 beat run of vtach while sleeping. MD notified. Charge nurse at bedside.

## 2021-06-10 NOTE — Progress Notes (Signed)
Pt had another 12 beat run of vtach with chest pain. EKG obtained. MD paged. Charge nurse at bedside.

## 2021-06-10 NOTE — Progress Notes (Signed)
The patient is injury-free, afebrile, alert, and oriented X 3. The vital signs were within the baseline during this shift. Abdominal pain controlled with current PRN pain regiment.. She denies chest pain, SOB, nausea, vomiting, dizziness, signs or symptoms of bleeding, or acute changes during this shift. We will continue to monitor and work toward achieving the care plan goals

## 2021-06-11 ENCOUNTER — Inpatient Hospital Stay (HOSPITAL_COMMUNITY): Payer: Self-pay

## 2021-06-11 DIAGNOSIS — R7303 Prediabetes: Secondary | ICD-10-CM

## 2021-06-11 DIAGNOSIS — J4521 Mild intermittent asthma with (acute) exacerbation: Secondary | ICD-10-CM

## 2021-06-11 DIAGNOSIS — E860 Dehydration: Secondary | ICD-10-CM

## 2021-06-11 DIAGNOSIS — I454 Nonspecific intraventricular block: Secondary | ICD-10-CM

## 2021-06-11 LAB — ECHOCARDIOGRAM COMPLETE
Area-P 1/2: 3.27 cm2
Calc EF: 69.6 %
Height: 61 in
S' Lateral: 2.8 cm
Single Plane A2C EF: 75.4 %
Single Plane A4C EF: 60.6 %
Weight: 2799.98 oz

## 2021-06-11 LAB — CBC
HCT: 33.4 % — ABNORMAL LOW (ref 36.0–46.0)
Hemoglobin: 10.9 g/dL — ABNORMAL LOW (ref 12.0–15.0)
MCH: 28.3 pg (ref 26.0–34.0)
MCHC: 32.6 g/dL (ref 30.0–36.0)
MCV: 86.8 fL (ref 80.0–100.0)
Platelets: 277 10*3/uL (ref 150–400)
RBC: 3.85 MIL/uL — ABNORMAL LOW (ref 3.87–5.11)
RDW: 15.1 % (ref 11.5–15.5)
WBC: 8.9 10*3/uL (ref 4.0–10.5)
nRBC: 0 % (ref 0.0–0.2)

## 2021-06-11 LAB — BASIC METABOLIC PANEL
Anion gap: 8 (ref 5–15)
BUN: 10 mg/dL (ref 6–20)
CO2: 26 mmol/L (ref 22–32)
Calcium: 9.2 mg/dL (ref 8.9–10.3)
Chloride: 103 mmol/L (ref 98–111)
Creatinine, Ser: 0.52 mg/dL (ref 0.44–1.00)
GFR, Estimated: >60 mL/min (ref >60)
Glucose, Bld: 130 mg/dL — ABNORMAL HIGH (ref 70–99)
Potassium: 3.6 mmol/L (ref 3.5–5.1)
Sodium: 137 mmol/L (ref 135–145)

## 2021-06-11 LAB — LEGIONELLA PNEUMOPHILA SEROGP 1 UR AG: L. pneumophila Serogp 1 Ur Ag: NEGATIVE

## 2021-06-11 LAB — GLUCOSE, CAPILLARY
Glucose-Capillary: 135 mg/dL — ABNORMAL HIGH (ref 70–99)
Glucose-Capillary: 135 mg/dL — ABNORMAL HIGH (ref 70–99)
Glucose-Capillary: 236 mg/dL — ABNORMAL HIGH (ref 70–99)
Glucose-Capillary: 182 mg/dL — ABNORMAL HIGH (ref 70–99)

## 2021-06-11 LAB — MAGNESIUM: Magnesium: 2 mg/dL (ref 1.7–2.4)

## 2021-06-11 MED ORDER — POTASSIUM CHLORIDE CRYS ER 20 MEQ PO TBCR
40.0000 meq | EXTENDED_RELEASE_TABLET | Freq: Once | ORAL | Status: AC
  Administered 2021-06-11: 40 meq via ORAL
  Filled 2021-06-11: qty 2

## 2021-06-11 MED ORDER — PERFLUTREN LIPID MICROSPHERE
1.0000 mL | INTRAVENOUS | Status: AC | PRN
  Administered 2021-06-11: 2 mL via INTRAVENOUS
  Filled 2021-06-11: qty 10

## 2021-06-11 MED ORDER — BISACODYL 10 MG RE SUPP: 10.0000 mg | Freq: Every day | RECTAL | Status: DC | PRN

## 2021-06-11 MED ORDER — LEVOFLOXACIN 500 MG PO TABS
500.0000 mg | ORAL_TABLET | Freq: Every day | ORAL | Status: DC
Start: 2021-06-11 — End: 2021-06-12
  Administered 2021-06-11 – 2021-06-12 (×2): 500 mg via ORAL
  Filled 2021-06-11 (×2): qty 1

## 2021-06-11 MED ORDER — PHENOL 1.4 % MT LIQD: 1.0000 | OROMUCOSAL | Status: DC | PRN

## 2021-06-11 MED ORDER — PANTOPRAZOLE SODIUM 40 MG PO TBEC
40.0000 mg | DELAYED_RELEASE_TABLET | Freq: Every day | ORAL | Status: DC
  Administered 2021-06-11 – 2021-06-14 (×4): 40 mg via ORAL
  Filled 2021-06-11 (×4): qty 1

## 2021-06-11 MED ORDER — POLYETHYLENE GLYCOL 3350 17 G PO PACK
17.0000 g | PACK | Freq: Every day | ORAL | Status: DC
  Administered 2021-06-11: 17 g via ORAL
  Filled 2021-06-11 (×4): qty 1

## 2021-06-11 MED ORDER — SENNA 8.6 MG PO TABS
2.0000 | ORAL_TABLET | Freq: Every day | ORAL | Status: DC
  Administered 2021-06-11 – 2021-06-14 (×4): 17.2 mg via ORAL
  Filled 2021-06-11 (×4): qty 2

## 2021-06-11 NOTE — Progress Notes (Deleted)
PT Cancellation Note  Patient Details Name: [name] MRN: [id number] DOB: Apr 10, 1966   Cancelled Treatment:    Reason Eval/Treat Not Completed: Other (comment) pt just finished working with OT. Will check back as schedule permits.   Myrtis Hopping [place] 06/11/2021, 2:12 PM Kati PT, DPT Acute Rehabilitation Services Pager: [phone] Office: [phone]

## 2021-06-11 NOTE — Plan of Care (Signed)
  Problem: Health Behavior/Discharge Planning: Goal: Ability to manage health-related needs will improve Outcome: Not Progressing   

## 2021-06-11 NOTE — Consult Note (Signed)
Cardiology Consultation:   Patient ID: [name] MRN: [id number]; DOB: 24-Mar-1966  Admit date: 06/07/2021 Date of Consult: 06/11/2021  PCP:  Gaylan Gerold, DO   [place] [facility] Cardiologist:  Pixie Casino, MD new   Patient Profile:   [name] is a 55 y.o. female with a hx of atypical chest pain,  HTN, anxiety, asthma, anemia, GERD, migraines, and THR in 2018 who is being seen 06/11/2021 for the evaluation of chest pain at the request of Dr. Algis Liming.  History of Present Illness:   Ms. Braggs was seen by our service in 2018 for atypical chest pain in the setting of hip replacement surgery. Echocardiogram was largely unremarkable aside from mild to moderate TR, CE negative, and EKG nonischemic. No further ischemic evaluation was recommended at that time.   She presented to [facility] 06/08/21 with body aches, CP, N/V, cough, and fever sine Friday, found to have PNA and positive for influenza A. She reported ongoing CP and cardiology was consulted. CE have been negative and EKG is nonischemic. During my interview, she describes several days of recent chest pain. At times, this feels like a burning sensation in the center of her chest, other times it feels like a pressure in her left chest. She also describes discomfort under her left lower ribs and epigastric region. CP has not disrupted her sleep, but has occurred outside of coughing. At times coughing can make her chest discomfort wore. She denies worsening chest pain with activity, although she has not been active recently sine becoming sick. Center chest is tender to palpation, but I was unable to reproduce the CP on her left chest.    Past Medical History:  Diagnosis Date   Anemia    Asthma    Bursitis of left hip    Decreased appetite    GERD (gastroesophageal reflux disease)    History of hiatal hernia    repaired   History of stomach ulcers    Hypertension    Migraine    "maybe twice in the last 10 yrs"  (10/16/2013)   PONV (postoperative nausea and vomiting)    Shortness of breath dyspnea     Past Surgical History:  Procedure Laterality Date   BARTHOLIN GLAND CYST EXCISION     BREAST LUMPECTOMY WITH RADIOACTIVE SEED LOCALIZATION Left 01/23/2016   Procedure: BREAST LUMPECTOMY WITH RADIOACTIVE SEED LOCALIZATION;  Surgeon: Jackolyn Confer, MD;  Location: [place];  Service: General;  Laterality: Left;   [place]  ~ [facility]  07/2003   HERNIA REPAIR     INCISION AND DRAINAGE ABSCESS  10/2005   Bartholin abscess.   TONSILLECTOMY AND ADENOIDECTOMY  1983   TOTAL HIP ARTHROPLASTY Right 06/05/2017   TOTAL HIP ARTHROPLASTY Right 06/06/2017   Procedure: RIGHT TOTAL HIP ARTHROPLASTY ANTERIOR APPROACH;  Surgeon: Rod Can, MD;  Location: [facility];  Service: Orthopedics;  Laterality: Right;  Needs RNFA   TUBAL LIGATION  1994     Home Medications:  Prior to Admission medications   Medication Sig Start Date End Date Taking? Authorizing Provider  acetaminophen (TYLENOL) 500 MG tablet Take 1 tablet (500 mg total) by mouth every 6 (six) hours as needed. Patient taking differently: Take 1,000 mg by mouth every 6 (six) hours as needed for mild pain. 09/20/19  Yes Fawze, Mina A, PA-C  albuterol (PROVENTIL) (2.5 MG/3ML) 0.083% nebulizer solution Take 3 mLs (2.5 mg total) by nebulization every 6 (six)  hours as needed for wheezing or shortness of breath. 12/17/19  Yes Masoudi, Elhamalsadat, MD  amLODipine (NORVASC) 10 MG tablet Take 1 tablet (10 mg total) by mouth daily. 08/07/20  Yes Katsadouros, Vasilios, MD  cetirizine (ZYRTEC) 10 MG tablet Take 10 mg by mouth daily.   Yes [provider]  diclofenac sodium (VOLTAREN) 1 % GEL Apply 4 g topically 4 (four) times daily. Patient taking differently: Apply 4 g topically 4 (four) times daily as needed (pain). 06/08/17  Yes Swinteck, Aaron Edelman, MD  ferrous sulfate 325 (65 FE) MG tablet Take 1 tablet (325 mg  total) by mouth daily. 08/11/16 06/08/21 Yes Ledell Noss, MD  fluticasone (FLONASE) 50 MCG/ACT nasal spray USE TWO SPRAY(S) IN EACH NOSTRIL ONCE DAILY Patient taking differently: Place 2 sprays into both nostrils daily as needed for allergies or rhinitis. 08/05/16  Yes Alphonzo Grieve, MD  albuterol (PROVENTIL HFA;VENTOLIN HFA) 108 (90 Base) MCG/ACT inhaler Inhale 2 puffs into the lungs every 6 (six) hours as needed for wheezing or shortness of breath. Patient not taking: Reported on 06/08/2021 08/21/18   Lorella Nimrod, MD  budesonide-formoterol [facility]) 80-4.5 MCG/ACT inhaler INHALE TWO PUFFS BY MOUTH INTO LUNGS TWICE DAILY Patient not taking: Reported on 02/02/2019 08/21/18   Lorella Nimrod, MD    Inpatient Medications: Scheduled Meds:  amLODipine  10 mg Oral Daily   benzonatate  200 mg Oral TID   enoxaparin (LOVENOX) injection  40 mg Subcutaneous Q24H   famotidine  20 mg Oral Daily   insulin aspart  0-20 Units Subcutaneous TID WC   levofloxacin  500 mg Oral Daily   loratadine  10 mg Oral Daily   oseltamivir  75 mg Oral BID   pantoprazole  40 mg Oral Daily   polyethylene glycol  17 g Oral Daily   senna  2 tablet Oral Daily   Continuous Infusions:  PRN Meds: acetaminophen **OR** acetaminophen, albuterol, alum & mag hydroxide-simeth, bisacodyl, guaiFENesin-dextromethorphan, morphine injection, nitroGLYCERIN, ondansetron **OR** ondansetron (ZOFRAN) IV, oxyCODONE, phenol  Allergies:    Allergies  Allergen Reactions   Amoxicillin Rash    PATIENT HAS HAD A PCN REACTION WITH IMMEDIATE RASH, FACIAL/TONGUE/THROAT SWELLING, SOB, OR LIGHTHEADEDNESS WITH HYPOTENSION:  #  #  #  YES  #  #  #   HAS PT DEVELOPED SEVERE RASH INVOLVING MUCUS MEMBRANES or SKIN NECROSIS: #  #  #  YES  #  #  #     "On my face" Has patient had a PCN reaction that required hospitalization: No Has patient had a PCN reaction occurring within the last 10 years:  #  #  #  UNKNOWN  #  #  #  .    Ampicillin Rash    PATIENT HAS  HAD A PCN REACTION WITH IMMEDIATE RASH, FACIAL/TONGUE/THROAT SWELLING, SOB, OR LIGHTHEADEDNESS WITH HYPOTENSION: # # # YES # # #  HAS PT DEVELOPED SEVERE RASH INVOLVING MUCUS MEMBRANES or SKIN NECROSIS: # # # YES # # # "On my face"e Has patient had a PCN reaction that required hospitalization: No Has patient had a PCN reaction occurring within the last 10 years:  #  #  #  UNKNOWN  #  #  #       Mobic [Meloxicam] Rash   Montelukast Sodium Rash   Phenergan [Promethazine Hcl] Nausea And Vomiting    Social History:   Social History   Socioeconomic History   Marital status: Single    Spouse name: Not on file  Number of children: Not on file   Years of education: Not on file   Highest education level: Not on file  Occupational History   Occupation: residential worker  Tobacco Use   Smoking status: Smoker, Current Status Unknown    Years: 1.00    Types: Cigarettes    Last attempt to quit: 06/01/2014    Years since quitting: 7.0   Smokeless tobacco: Never  Vaping Use   Vaping Use: Never used  Substance and Sexual Activity   Alcohol use: Not Currently    Alcohol/week: 0.0 standard drinks   Drug use: No   Sexual activity: Yes  Other Topics Concern   Not on file  Social History Narrative   Not on file   Social Determinants of Health   Financial Resource Strain: Not on file  Food Insecurity: Not on file  Transportation Needs: Not on file  Physical Activity: Not on file  Stress: Not on file  Social Connections: Not on file  Intimate Partner Violence: Not on file    Family History:    Family History  Problem Relation Age of Onset   Cancer Mother 52       unknown   Heart attack Father 21   Heart attack Sister 87   Diabetes Sister      ROS:  Please see the history of present illness.   All other ROS reviewed and negative.     Physical Exam/Data:   Vitals:   06/10/21 2302 06/11/21 0307 06/11/21 0705 06/11/21 1137  BP: 132/79 (!) 155/75 136/73 (!) 141/79   Pulse: (!) 55 (!) 59 (!) 59 67  Resp: 17 17 16 20   Temp: 98.4 F (36.9 C) 98.9 F (37.2 C) 99 F (37.2 C) 98.3 F (36.8 C)  TempSrc: Oral Oral Oral Oral  SpO2: 98% 98% 100% 95%  Weight:      Height:       No intake or output data in the 24 hours ending 06/11/21 1203 Last 3 Weights 06/08/2021 05/20/2021 08/20/2020  Weight (lbs) 175 lb 175 lb 176 lb 11.2 oz  Weight (kg) 79.379 kg 79.379 kg 80.151 kg  Some encounter information is confidential and restricted. Go to Review Flowsheets activity to see all data.     Body mass index is 33.07 kg/m.  General:  Well nourished, well developed, in no acute distress HEENT: normal Neck: no JVD Vascular: No carotid bruits; Distal pulses 2+ bilaterally Cardiac:  normal S1, S2; RRR; no murmur  Lungs:  clear to auscultation bilaterally, no wheezing, rhonchi or rales  Abd: soft, nontender, no hepatomegaly  Ext: no edema Musculoskeletal:  No deformities, BUE and BLE strength normal and equal Skin: warm and dry  Neuro:  CNs 2-12 intact, no focal abnormalities noted Psych:  Normal affect   EKG:  The EKG was personally reviewed and demonstrates:  narrow complex tachycardia with HR 134, LBBB - sinus or atrial tachycardia Telemetry:  Telemetry was personally reviewed and demonstrates:  sinus rhythm HR now in the 90s  Relevant CV Studies:  Echo 2018 Study Conclusions   - Left ventricle: The cavity size was normal. Wall thickness was    increased in a pattern of mild LVH. Systolic function was    vigorous. The estimated ejection fraction was in the range of 65%    to 70%. Wall motion was normal; there were no regional wall    motion abnormalities. Left ventricular diastolic function    parameters were normal.  - Tricuspid valve: There  was mild-moderate regurgitation.   Laboratory Data:  High Sensitivity Troponin:   Recent Labs  Lab 06/10/21 1611 06/10/21 1827  TROPONINIHS 9 8     Chemistry Recent Labs  Lab 06/09/21 0514  06/10/21 1611 06/11/21 0601  NA 136 136 137  K 4.0 3.7 3.6  CL 110 104 103  CO2 23 24 26   GLUCOSE 147* 231* 130*  BUN 11 8 10   CREATININE 0.47 0.54 0.52  CALCIUM 9.3 9.4 9.2  MG  --  1.8 2.0  GFRNONAA >60 >60 >60  ANIONGAP 3* 8 8    Recent Labs  Lab 06/08/21 0311  PROT 7.6  ALBUMIN 4.1  AST 18  ALT 17  ALKPHOS 50  BILITOT 0.6   Lipids No results for input(s): CHOL, TRIG, HDL, LABVLDL, LDLCALC, CHOLHDL in the last 168 hours.  Hematology Recent Labs  Lab 06/09/21 0514 06/09/21 1400 06/10/21 1611 06/11/21 0509  WBC 10.0  --  8.0 8.9  RBC 3.48* 3.49* 3.77* 3.85*  HGB 9.9*  --  10.8* 10.9*  HCT 30.3*  --  32.7* 33.4*  MCV 87.1  --  86.7 86.8  MCH 28.4  --  28.6 28.3  MCHC 32.7  --  33.0 32.6  RDW 15.4  --  15.3 15.1  PLT 229  --  267 277   Thyroid No results for input(s): TSH, FREET4 in the last 168 hours.  BNPNo results for input(s): BNP, PROBNP in the last 168 hours.  DDimer No results for input(s): DDIMER in the last 168 hours.   Radiology/Studies:  DG Chest 2 View  Result Date: 06/07/2021 CLINICAL DATA:  Cough EXAM: CHEST - 2 VIEW COMPARISON:  Chest x-ray 02/02/2019 FINDINGS: The heart and mediastinal contours are unchanged. Possible developing trace left lower lobe airspace opacity. No pulmonary edema. No pleural effusion. No pneumothorax. No acute osseous abnormality. Right upper quadrant surgical clips. IMPRESSION: Possible developing trace left lower lobe airspace opacity. Electronically Signed   By: Iven Finn M.D.   On: 06/07/2021 22:22   CT CHEST WO CONTRAST  Result Date: 06/09/2021 CLINICAL DATA:  Hemoptysis EXAM: CT CHEST WITHOUT CONTRAST TECHNIQUE: Multidetector CT imaging of the chest was performed following the standard protocol without IV contrast. COMPARISON:  None. FINDINGS: Cardiovascular: Aortic atherosclerosis. Mild cardiomegaly. Trace pericardial effusion. Mediastinum/Nodes: No enlarged mediastinal, hilar, or axillary lymph nodes. Small  hiatal hernia. Thyroid gland, trachea, and esophagus demonstrate no significant findings. Lungs/Pleura: There is bilateral heterogeneous ground-glass and consolidative airspace opacity, most conspicuously in the lower lobes (series 5, image 95) but also seen in the right upper and right middle lobes. No pleural effusion or pneumothorax. Upper Abdomen: No acute abnormality. Benign, fatty adenomatous thickening of the bilateral adrenal glands. Musculoskeletal: No chest wall mass or suspicious bone lesions identified. IMPRESSION: 1. There is bilateral heterogeneous ground-glass and consolidative airspace opacity, most conspicuously in the lower lobes but also seen in the right upper and right middle lobes. Findings are consistent with multifocal infection and/or aspiration. 2. Small hiatal hernia, which may place the patient at risk for aspiration. Aortic Atherosclerosis (ICD10-I70.0). Electronically Signed   By: Delanna Ahmadi M.D.   On: 06/09/2021 14:34     Assessment and Plan:   Chest pain - hs troponin x 2 negative - EKG nonischemic - in the setting of chronic coughing for 1 week  - she had a 12 beat run of NSVT - echo pending - she describes different types of chest pain with typical and atypical features - not  all CP reproducible by palpation - given negative enzymes, will let her recover from current illness and see back in the office - will likely evaluate with coronary CTA after she recovers from acute illness   Sinus tachycardia - atrial tachycardia LBBB - old - in the setting of acute illness and asthma exacerbation   NSVT - K 3.7 - replaced - Mg 1.8 - replaced - asymptomatic - echo as above   Hypertension - continue 10 mg amlodipine   Influenza A CAP Asthma exacerbation - ABX per primary     Risk Assessment/Risk Scores:     HEAR Score (for undifferentiated chest pain):  HEAR Score: 2       For questions or updates, please contact [place] Please consult  [email or web address] for contact info under    Signed, Ledora Bottcher, [place]  06/11/2021 12:03 PM

## 2021-06-11 NOTE — Progress Notes (Signed)
PHARMACIST - PHYSICIAN COMMUNICATION  CONCERNING: Antibiotic IV to Oral Route Change Policy  RECOMMENDATION: This patient is receiving levaquin by the intravenous route.  Based on criteria approved by the Pharmacy and Therapeutics Committee, the antibiotic(s) is/are being converted to the equivalent oral dose form(s).   DESCRIPTION: These criteria include: Patient being treated for a respiratory tract infection, urinary tract infection, cellulitis or clostridium difficile associated diarrhea if on metronidazole The patient is not neutropenic and does not exhibit a GI malabsorption state The patient is eating (either orally or via tube) and/or has been taking other orally administered medications for a least 24 hours The patient is improving clinically and has a Tmax < 100.5  If you have questions about this conversion, please contact the Pharmacy Department  []  ( [phone] )  [place] []  ( [phone]  [facility] []  ( [phone]  [place] []  ( [phone] )  [facility] [x]  ( [phone] )  [facility]   

## 2021-06-11 NOTE — Progress Notes (Signed)
PROGRESS NOTE    Felisha L Hammill  NLG:[id number] DOB: 07-15-1965 DOA: 06/07/2021 PCP: Gaylan Gerold, DO   Chief Complaint  Patient presents with   Generalized Body Aches   Chest Pain   Emesis   Cough   Chills    Brief Narrative:   Junetta L Janish is a 55 y.o. female with medical history significant of anemia, mild intermittent asthma, left hip bursitis, GERD, hiatal hernia, history of gastric PUD, hypertension, migraine headaches, class I obesity presents to the emergency department with nausea, vomiting, chest pain, sob and cough. CXR showed possible developing trace left lower lobe airspace opacity.  CT chest 12/6 confirms consolidative airspace opacities.  Treating for pneumonia and influenza a.  Had episodes of NSVT on 12/7, transferred to progressive unit.  Cardiology consulted 12/8.  Assessment & Plan:   Principal Problem:   Influenza A Active Problems:   Hypertension   Iron deficiency anemia   Hypokalemia   Class 1 obesity   Prediabetes   Hyperlipidemia   GERD (gastroesophageal reflux disease)   Asthma exacerbation   Community-acquired pneumonia complicating influenza A: - CT chest 12/6: Bilateral heterogeneous groundglass and consolidative airspace opacity, most conspicuously in the lower lobes but also seen in the right upper and middle lobes consistent with multifocal infection and/or aspiration. -Urine pneumococcal and Legionella antigen: Negative. - Started her on IV Levaquin 12/5 as she had severe reaction to penicillins and hasn't used cephalosporins in the past.  Given her NSVT, discussed with pharmacy regarding alternate choices of antibiotics.  However EKG has normal QTC.  Has completed 4 doses thus far.  Complete course of Tamiflu. -Appears to have weaned to room air late this morning.  Mild hemoptysis 10/6.  Probably from acute bronchitis and multifocal pneumonia.  In view of her left lower lobe opacity will get CT chest without contrast for further  evaluation was obtained and results as above.  Pt reports dysphagia from childhood, speech therapy evaluated and recommend regular diet and thin liquids Resolved  Influenza A/ Mild Asthma exacerbation: Continue with Tamiflu and complete course.  Quick taper of prednisone. Supportive treatment with antitussives, as needed bronchodilator nebs, pain management and improving.  Atypical chest pain Likely musculoskeletal due to coughing from respiratory illness as noted above.  Other differential, GERD.  Low index of suspicion for ACS or PE at this time. Echo in 2018 had normal EF.  Repeat echo 12/8: LVEF 65-70% no regional wall motion abnormalities.  Grade 1 diastolic dysfunction. EKG 12/7 personally reviewed, NSR at 64 bpm, normal axis, no acute abnormalities.  QTc 420 ms.  High-sensitivity troponin x2: Negative. -Reports no significant improvement with SL NTG yesterday or with Maalox. -Cardiology consultation appreciated, indicate rate dependent BBB (although do not know if they were able to appreciate the NSVT on telemetry which was not visible today after she moved units), reproducible chest pain and do not think that she needs additional work-up.  NSVT 12 beat NSVT x2 on 12/7.  High-sensitivity troponins x2 negative.  Echo with preserved LVEF. Replacing potassium and magnesium, aim to keep potassium >4 and magnesium >2. Continue telemetry. 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  Hypokalemia:  Potassium 3.6, replace as noted above.  Magnesium: 2.  Hypertension:   well controlled.   GERD/small hiatal hernia on CT chest:  Severe as per the patient, continue Protonix.  Added Maalox/Mylanta.  Nausea, vomiting, Abdominal pain.  Symptomatic management with anti emetics, cough meds  and pain control.  May be related to her influenza.  Improved.  Normocytic anemia/iron deficiency: Hemoglobin gradually drifting down in  the absence of overt bleeding. Anemia panel: Iron 12, TIBC 395, saturation ratio 3, ferritin 50, folate 12.2, B12: 367 and absolute reticulocyte count 51 Start ferrous sulfate as outpatient. Stable  Constipation Initiateded bowel regimen.  Prediabetes A1c 6.  Mild hyperglycemia related to steroids and should resolve after stopping.   DVT prophylaxis: scd's.  Added Lovenox for DVT prophylaxis Code Status: full code.  Family Communication: None at bedside Disposition:   Status is: Inpatient  The patient will require care spanning > 2 midnights and should be moved to inpatient because: IV antibiotics, IV fluids,       Consultants:  Cardiology  Procedures: none.   Antimicrobials:  Antibiotics Given (last 72 hours)     Date/Time Action Medication Dose Rate   06/08/21 2137 Given   oseltamivir (TAMIFLU) capsule 75 mg 75 mg    06/09/21 0915 Given   oseltamivir (TAMIFLU) capsule 75 mg 75 mg    06/09/21 1538 New Bag/Given   levofloxacin (LEVAQUIN) IVPB 500 mg 500 mg 100 mL/hr   06/09/21 2254 Given   oseltamivir (TAMIFLU) capsule 75 mg 75 mg    06/10/21 0911 Given   oseltamivir (TAMIFLU) capsule 75 mg 75 mg    06/10/21 1048 New Bag/Given   levofloxacin (LEVAQUIN) IVPB 500 mg 500 mg 100 mL/hr   06/11/21 0020 Given   oseltamivir (TAMIFLU) capsule 75 mg 75 mg    06/11/21 0929 Given   oseltamivir (TAMIFLU) capsule 75 mg 75 mg    06/11/21 0930 Given   levofloxacin (LEVAQUIN) tablet 500 mg 500 mg          Subjective: Seen this morning.  Ongoing precordial area chest pain.  Says no relief with SL NTG yesterday.  Says that it comes on approximately hourly.  Asking for constipation medications.  Objective: Vitals:   06/10/21 2302 06/11/21 0307 06/11/21 0705 06/11/21 1137  BP: 132/79 (!) 155/75 136/73 (!) 141/79  Pulse: (!) 55 (!) 59 (!) 59 67  Resp: 17 17 16 20   Temp: 98.4 F (36.9 C) 98.9 F (37.2 C) 99 F (37.2 C) 98.3 F (36.8 C)  TempSrc: Oral Oral Oral Oral   SpO2: 98% 98% 100% 95%  Weight:      Height:       No intake or output data in the 24 hours ending 06/11/21 1602  Filed Weights   06/08/21 1615  Weight: 79.4 kg    Examination:  General exam: [place] female, moderately built and nourished lying comfortably supine in bed without distress.  Was sleeping when I walked in. Respiratory system: Clear to auscultation.  No increased work of breathing. Cardiovascular system: S1 and S2 heard, RRR.  No JVD, murmurs or pedal edema.  Telemetry personally reviewed: Sinus rhythm with intermittent BBB morphology.  No further NSVT since 12/7 Gastrointestinal system: Abdomen is nondistended, soft and nontender.  Normal bowel sounds heard. Central nervous system: Alert and oriented. No focal neurological deficits. Extremities: Symmetric 5 x 5 power. Skin: No rashes, lesions or ulcers Psychiatry: Appropriate Ears: Otoscopy on 12/7 , left ear without acute findings.  Right ear difficult to fully examine due to cerumen in the external auditory canal but did not see any appreciable acute findings.    Data Reviewed: I have personally reviewed following labs and imaging studies  CBC: Recent Labs  Lab 06/08/21 0311 06/09/21 0514 06/10/21 1611 06/11/21 0509  WBC 11.1* 10.0 8.0 8.9  NEUTROABS 10.1*  --   --   --   HGB 10.5* 9.9* 10.8* 10.9*  HCT 31.4* 30.3* 32.7* 33.4*  MCV 85.8 87.1 86.7 86.8  PLT 244 229 267 300    Basic Metabolic Panel: Recent Labs  Lab 06/08/21 0311 06/09/21 0514 06/10/21 1611 06/11/21 0601  NA 135 136 136 137  K 3.1* 4.0 3.7 3.6  CL 100 110 104 103  CO2 25 23 24 26   GLUCOSE 161* 147* 231* 130*  BUN 12 11 8 10   CREATININE 0.67 0.47 0.54 0.52  CALCIUM 9.4 9.3 9.4 9.2  MG  --   --  1.8 2.0    GFR: Estimated Creatinine Clearance: 77.5 mL/min (by C-G formula based on SCr of 0.52 mg/dL).  Liver Function Tests: Recent Labs  Lab 06/08/21 0311  AST 18  ALT 17  ALKPHOS 50  BILITOT 0.6  PROT 7.6  ALBUMIN 4.1     CBG: Recent Labs  Lab 06/10/21 1215 06/10/21 1636 06/10/21 2025 06/11/21 0744 06/11/21 1134  GLUCAP 200* 215* 189* 135* 135*     Recent Results (from the past 240 hour(s))  Resp Panel by RT-PCR (Flu A&B, Covid) Nasopharyngeal Swab     Status: Abnormal   Collection Time: 06/07/21  9:54 PM   Specimen: Nasopharyngeal Swab; Nasopharyngeal(NP) swabs in vial transport medium  Result Value Ref Range Status   SARS Coronavirus 2 by RT PCR NEGATIVE NEGATIVE Final    Comment: (NOTE) SARS-CoV-2 target nucleic acids are NOT DETECTED.  The SARS-CoV-2 RNA is generally detectable in upper respiratory specimens during the acute phase of infection. The lowest concentration of SARS-CoV-2 viral copies this assay can detect is 138 copies/mL. A negative result does not preclude SARS-Cov-2 infection and should not be used as the sole basis for treatment or other patient management decisions. A negative result may occur with  improper specimen collection/handling, submission of specimen other than nasopharyngeal swab, presence of viral mutation(s) within the areas targeted by this assay, and inadequate number of viral copies(<138 copies/mL). A negative result must be combined with clinical observations, patient history, and epidemiological information. The expected result is Negative.  Fact Sheet for Patients:  EntrepreneurPulse.com.au  Fact Sheet for Healthcare Providers:  IncredibleEmployment.be  This test is no t yet approved or cleared by the [place] FDA and  has been authorized for detection and/or diagnosis of SARS-CoV-2 by FDA under an Emergency Use Authorization (EUA). This EUA will remain  in effect (meaning this test can be used) for the duration of the COVID-19 declaration under Section 564(b)(1) of the Act, 21 U.S.C.section 360bbb-3(b)(1), unless the authorization is terminated  or revoked sooner.       Influenza A by PCR POSITIVE  (A) NEGATIVE Final   Influenza B by PCR NEGATIVE NEGATIVE Final    Comment: (NOTE) The Xpert Xpress SARS-CoV-2/FLU/RSV plus assay is intended as an aid in the diagnosis of influenza from Nasopharyngeal swab specimens and should not be used as a sole basis for treatment. Nasal washings and aspirates are unacceptable for Xpert Xpress SARS-CoV-2/FLU/RSV testing.  Fact Sheet for Patients: EntrepreneurPulse.com.au  Fact Sheet for Healthcare Providers: IncredibleEmployment.be  This test is not yet approved or cleared by the [place] FDA and has been authorized for detection and/or diagnosis of SARS-CoV-2 by FDA under an Emergency Use Authorization (EUA). This EUA will remain in effect (meaning this test can be used) for the duration of the COVID-19 declaration under Section 564(b)(1) of the  Act, 21 U.S.C. section 360bbb-3(b)(1), unless the authorization is terminated or revoked.  Performed at [facility], [place] [address] [place], [place] [address]          Radiology Studies: ECHOCARDIOGRAM COMPLETE  Result Date: 06/11/2021    ECHOCARDIOGRAM REPORT   Patient Name:   NYSSA SAYEGH Date of Exam: 06/11/2021 Medical Rec #:  [id number]      Height:       61.0 in Accession #:    [id number]     Weight:       175.0 lb Date of Birth:  09/06/1965      BSA:          1.785 m Patient Age:    27 years       BP:           141/79 mmHg Patient Gender: F              HR:           67 bpm. Exam Location:  Inpatient Procedure: 2D Echo, Cardiac Doppler, Color Doppler and Intracardiac            Opacification Agent Indications:    NSVT (nonsustained ventricular tachycardia)  History:        Patient has prior history of Echocardiogram examinations, most                 recent 06/07/2017. Risk Factors:Hypertension and Dyslipidemia.                 Influenza A. GERD.  Sonographer:    Darlina Sicilian RDCS Referring Phys: [place]  1.  Left ventricular ejection fraction, by estimation, is 65 to 70%. The left ventricle has normal function. The left ventricle has no regional wall motion abnormalities. There is mild concentric left ventricular hypertrophy. Left ventricular diastolic parameters are consistent with Grade I diastolic dysfunction (impaired relaxation).  2. Right ventricular systolic function is normal. The right ventricular size is normal. Tricuspid regurgitation signal is inadequate for assessing PA pressure.  3. The mitral valve is normal in structure. Trivial mitral valve regurgitation.  4. The aortic valve is tricuspid. There is mild calcification of the aortic valve. There is mild thickening of the aortic valve. Aortic valve regurgitation is not visualized.  5. The inferior vena cava is normal in size with greater than 50% respiratory variability, suggesting right atrial pressure of 3 mmHg. Comparison(s): Compared to prior TTE in 2018, there is no significant change. FINDINGS  Left Ventricle: Left ventricular ejection fraction, by estimation, is 65 to 70%. The left ventricle has normal function. The left ventricle has no regional wall motion abnormalities. Definity contrast agent was given IV to delineate the left ventricular  endocardial borders. The left ventricular internal cavity size was normal in size. There is mild concentric left ventricular hypertrophy. Left ventricular diastolic parameters are consistent with Grade I diastolic dysfunction (impaired relaxation). Right Ventricle: The right ventricular size is normal. No increase in right ventricular wall thickness. Right ventricular systolic function is normal. Tricuspid regurgitation signal is inadequate for assessing PA pressure. Left Atrium: Left atrial size was normal in size. Right Atrium: Right atrial size was normal in size. Pericardium: There is no evidence of pericardial effusion. Mitral Valve: The mitral valve is normal in structure. Mild mitral annular  calcification. Trivial mitral valve regurgitation. Tricuspid Valve: The tricuspid valve is normal in structure. Tricuspid valve regurgitation is trivial. Aortic Valve: The aortic valve is tricuspid. There  is mild calcification of the aortic valve. There is mild thickening of the aortic valve. Aortic valve regurgitation is not visualized. Pulmonic Valve: The pulmonic valve was not well visualized. Pulmonic valve regurgitation is trivial. Aorta: The aortic root and ascending aorta are structurally normal, with no evidence of dilitation. Venous: The inferior vena cava is normal in size with greater than 50% respiratory variability, suggesting right atrial pressure of 3 mmHg. IAS/Shunts: No atrial level shunt detected by color flow Doppler.  LEFT VENTRICLE PLAX 2D LVIDd:         4.90 cm      Diastology LVIDs:         2.80 cm      LV e' medial:    4.73 cm/s LV PW:         1.00 cm      LV E/e' medial:  14.1 LV IVS:        0.80 cm      LV e' lateral:   5.63 cm/s LVOT diam:     2.00 cm      LV E/e' lateral: 11.9 LV SV:         68 LV SV Index:   38 LVOT Area:     3.14 cm  LV Volumes (MOD) LV vol d, MOD A2C: 110.0 ml LV vol d, MOD A4C: 128.0 ml LV vol s, MOD A2C: 27.1 ml LV vol s, MOD A4C: 50.4 ml LV SV MOD A2C:     82.9 ml LV SV MOD A4C:     128.0 ml LV SV MOD BP:      83.5 ml RIGHT VENTRICLE RV S prime:     18.20 cm/s TAPSE (M-mode): 2.6 cm LEFT ATRIUM             Index        RIGHT ATRIUM          Index LA diam:        3.90 cm 2.19 cm/m   RA Area:     9.10 cm LA Vol (A2C):   31.3 ml 17.54 ml/m  RA Volume:   14.00 ml 7.85 ml/m LA Vol (A4C):   27.8 ml 15.58 ml/m LA Biplane Vol: 29.6 ml 16.59 ml/m  AORTIC VALVE LVOT Vmax:   108.00 cm/s LVOT Vmean:  66.700 cm/s LVOT VTI:    0.217 m  AORTA Ao Root diam: 2.80 cm Ao Asc diam:  3.40 cm MITRAL VALVE MV Area (PHT): 3.27 cm    SHUNTS MV Decel Time: 232 msec    Systemic VTI:  0.22 m MV E velocity: 66.80 cm/s  Systemic Diam: 2.00 cm MV A velocity: 72.40 cm/s MV E/A ratio:  0.92  Gwyndolyn Kaufman MD Electronically signed by Gwyndolyn Kaufman MD Signature Date/Time: 06/11/2021/3:59:58 PM    Final         Scheduled Meds:  amLODipine  10 mg Oral Daily   benzonatate  200 mg Oral TID   enoxaparin (LOVENOX) injection  40 mg Subcutaneous Q24H   famotidine  20 mg Oral Daily   insulin aspart  0-20 Units Subcutaneous TID WC   levofloxacin  500 mg Oral Daily   loratadine  10 mg Oral Daily   oseltamivir  75 mg Oral BID   pantoprazole  40 mg Oral Daily   polyethylene glycol  17 g Oral Daily   senna  2 tablet Oral Daily   Continuous Infusions:     LOS: 2 days  Vernell Leep, MD [facility]   To contact the attending provider between 7A-7P or the covering provider during after hours 7P-7A, please log into the web site [email or web address] and access using universal [place] password for that web site. If you do not have the password, please call the hospital operator.  06/11/2021, 4:02 PM

## 2021-06-11 NOTE — Progress Notes (Signed)
PT Cancellation Note  Patient Details Name: [name] MRN: [id number] DOB: 29-Feb-1966   Cancelled Treatment:    Reason Eval/Treat Not Completed: PT screened, no needs identified, will sign off Pt reports she has been up ambulating in her room and declines need for PT at this time.  PT to sign off.   Myrtis Hopping [place] 06/11/2021, 2:49 PM Jannette Spanner PT, DPT Acute Rehabilitation Services Pager: [phone] Office: [phone]

## 2021-06-12 LAB — BASIC METABOLIC PANEL
Anion gap: 7 (ref 5–15)
BUN: 16 mg/dL (ref 6–20)
CO2: 28 mmol/L (ref 22–32)
Calcium: 9.3 mg/dL (ref 8.9–10.3)
Chloride: 101 mmol/L (ref 98–111)
Creatinine, Ser: 0.54 mg/dL (ref 0.44–1.00)
GFR, Estimated: >60 mL/min (ref >60)
Glucose, Bld: 145 mg/dL — ABNORMAL HIGH (ref 70–99)
Potassium: 3.7 mmol/L (ref 3.5–5.1)
Sodium: 136 mmol/L (ref 135–145)

## 2021-06-12 LAB — GLUCOSE, CAPILLARY
Glucose-Capillary: 144 mg/dL — ABNORMAL HIGH (ref 70–99)
Glucose-Capillary: 146 mg/dL — ABNORMAL HIGH (ref 70–99)
Glucose-Capillary: 136 mg/dL — ABNORMAL HIGH (ref 70–99)
Glucose-Capillary: 241 mg/dL — ABNORMAL HIGH (ref 70–99)

## 2021-06-12 MED ORDER — POTASSIUM CHLORIDE CRYS ER 20 MEQ PO TBCR
40.0000 meq | EXTENDED_RELEASE_TABLET | Freq: Once | ORAL | Status: AC
  Administered 2021-06-12: 40 meq via ORAL
  Filled 2021-06-12: qty 2

## 2021-06-12 MED ORDER — ALBUTEROL SULFATE HFA 108 (90 BASE) MCG/ACT IN AERS
2.0000 | INHALATION_SPRAY | Freq: Four times a day (QID) | RESPIRATORY_TRACT | Status: DC | PRN
  Filled 2021-06-12: qty 6.7

## 2021-06-12 NOTE — Progress Notes (Addendum)
DAILY PROGRESS NOTE   Patient Name: [name] Date of Encounter: 06/12/2021 Cardiologist: Pixie Casino, MD  Chief Complaint   No complaints  Patient Profile   [name] is a 55 y.o. female with a hx of atypical chest pain,  HTN, anxiety, asthma, anemia, GERD, migraines, and THR in 2018 who is being seen 06/11/2021 for the evaluation of chest pain at the request of Dr. Algis Liming.  Subjective   Telemetry showed sinus rhythm overnight - no further NSVT. Suspect this was electrolyte related. Would target K>4, Mg >2. Echo yesterday showed normal systolic function with normal wall motion, which is reassuring. QTC on telemetry is normal - EKG pending.  Objective   Vitals:   06/11/21 0705 06/11/21 1137 06/11/21 2052 06/12/21 0610  BP: 136/73 (!) 141/79 (!) 146/87 (!) 149/81  Pulse: (!) 59 67 70 79  Resp: 16 20 17 18   Temp: 99 F (37.2 C) 98.3 F (36.8 C) 97.7 F (36.5 C) 98.5 F (36.9 C)  TempSrc: Oral Oral Oral Oral  SpO2: 100% 95% 98% 100%  Weight:      Height:       No intake or output data in the 24 hours ending 06/12/21 0802 Filed Weights   06/08/21 1615  Weight: 79.4 kg    Physical Exam   General appearance: alert and no distress Lungs: diminished breath sounds bibasilar Heart: regular rate and rhythm Extremities: extremities normal, atraumatic, no cyanosis or edema Neurologic: Grossly normal  Inpatient Medications    Scheduled Meds:  amLODipine  10 mg Oral Daily   benzonatate  200 mg Oral TID   enoxaparin (LOVENOX) injection  40 mg Subcutaneous Q24H   famotidine  20 mg Oral Daily   insulin aspart  0-20 Units Subcutaneous TID WC   levofloxacin  500 mg Oral Daily   loratadine  10 mg Oral Daily   oseltamivir  75 mg Oral BID   pantoprazole  40 mg Oral Daily   polyethylene glycol  17 g Oral Daily   senna  2 tablet Oral Daily    Continuous Infusions:   PRN Meds: acetaminophen **OR** acetaminophen, albuterol, alum & mag hydroxide-simeth,  bisacodyl, guaiFENesin-dextromethorphan, morphine injection, nitroGLYCERIN, ondansetron **OR** ondansetron (ZOFRAN) IV, oxyCODONE, phenol   Labs   Results for orders placed or performed during the hospital encounter of 06/07/21 (from the past 48 hour(s))  Glucose, capillary     Status: Abnormal   Collection Time: 06/10/21 12:15 PM  Result Value Ref Range   Glucose-Capillary 200 (H) 70 - 99 mg/dL    Comment: Glucose reference range applies only to samples taken after fasting for at least 8 hours.   Comment 1 Notify RN   CBC     Status: Abnormal   Collection Time: 06/10/21  4:11 PM  Result Value Ref Range   WBC 8.0 4.0 - 10.5 K/uL   RBC 3.77 (L) 3.87 - 5.11 MIL/uL   Hemoglobin 10.8 (L) 12.0 - 15.0 g/dL   HCT 32.7 (L) 36.0 - 46.0 %   MCV 86.7 80.0 - 100.0 fL   MCH 28.6 26.0 - 34.0 pg   MCHC 33.0 30.0 - 36.0 g/dL   RDW 15.3 11.5 - 15.5 %   Platelets 267 150 - 400 K/uL   nRBC 0.0 0.0 - 0.2 %    Comment: Performed at [facility], [place] [address], [place], [place] [address]  Basic metabolic panel     Status: Abnormal   Collection Time: 06/10/21  4:11 PM  Result Value Ref Range   Sodium 136 135 - 145 mmol/L   Potassium 3.7 3.5 - 5.1 mmol/L   Chloride 104 98 - 111 mmol/L   CO2 24 22 - 32 mmol/L   Glucose, Bld 231 (H) 70 - 99 mg/dL    Comment: Glucose reference range applies only to samples taken after fasting for at least 8 hours.   BUN 8 6 - 20 mg/dL   Creatinine, Ser 0.54 0.44 - 1.00 mg/dL   Calcium 9.4 8.9 - 10.3 mg/dL   GFR, Estimated >60 >60 mL/min    Comment: (NOTE) Calculated using the CKD-EPI Creatinine Equation (2021)    Anion gap 8 5 - 15    Comment: Performed at [facility], [place] [address] [place], [place] [address]  Magnesium     Status: None   Collection Time: 06/10/21  4:11 PM  Result Value Ref Range   Magnesium 1.8 1.7 - 2.4 mg/dL    Comment: Performed at [facility], [place] [address] [place], [place]  [address]  Troponin I (High Sensitivity)     Status: None   Collection Time: 06/10/21  4:11 PM  Result Value Ref Range   Troponin I (High Sensitivity) 9 <18 ng/L    Comment: (NOTE) Elevated high sensitivity troponin I (hsTnI) values and significant  changes across serial measurements may suggest ACS but many other  chronic and acute conditions are known to elevate hsTnI results.  Refer to the "Links" section for chest pain algorithms and additional  guidance. Performed at [facility], [place] [address] [place], [place] [address]   Glucose, capillary     Status: Abnormal   Collection Time: 06/10/21  4:36 PM  Result Value Ref Range   Glucose-Capillary 215 (H) 70 - 99 mg/dL    Comment: Glucose reference range applies only to samples taken after fasting for at least 8 hours.   Comment 1 Notify RN   Troponin I (High Sensitivity)     Status: None   Collection Time: 06/10/21  6:27 PM  Result Value Ref Range   Troponin I (High Sensitivity) 8 <18 ng/L    Comment: (NOTE) Elevated high sensitivity troponin I (hsTnI) values and significant  changes across serial measurements may suggest ACS but many other  chronic and acute conditions are known to elevate hsTnI results.  Refer to the "Links" section for chest pain algorithms and additional  guidance. Performed at [facility], [place] [address] [place], [place] [address]   Glucose, capillary     Status: Abnormal   Collection Time: 06/10/21  8:25 PM  Result Value Ref Range   Glucose-Capillary 189 (H) 70 - 99 mg/dL    Comment: Glucose reference range applies only to samples taken after fasting for at least 8 hours.   Comment 1 Notify RN    Comment 2 Document in Chart   CBC     Status: Abnormal   Collection Time: 06/11/21  5:09 AM  Result Value Ref Range   WBC 8.9 4.0 - 10.5 K/uL   RBC 3.85 (L) 3.87 - 5.11 MIL/uL   Hemoglobin 10.9 (L) 12.0 - 15.0 g/dL   HCT 33.4 (L) 36.0 - 46.0 %   MCV 86.8 80.0 - 100.0  fL   MCH 28.3 26.0 - 34.0 pg   MCHC 32.6 30.0 - 36.0 g/dL   RDW 15.1 11.5 - 15.5 %   Platelets 277 150 - 400 K/uL  nRBC 0.0 0.0 - 0.2 %    Comment: Performed at [facility], [place] [address] [place], [place] [address]  Basic metabolic panel     Status: Abnormal   Collection Time: 06/11/21  6:01 AM  Result Value Ref Range   Sodium 137 135 - 145 mmol/L   Potassium 3.6 3.5 - 5.1 mmol/L   Chloride 103 98 - 111 mmol/L   CO2 26 22 - 32 mmol/L   Glucose, Bld 130 (H) 70 - 99 mg/dL    Comment: Glucose reference range applies only to samples taken after fasting for at least 8 hours.   BUN 10 6 - 20 mg/dL   Creatinine, Ser 0.52 0.44 - 1.00 mg/dL   Calcium 9.2 8.9 - 10.3 mg/dL   GFR, Estimated >60 >60 mL/min    Comment: (NOTE) Calculated using the CKD-EPI Creatinine Equation (2021)    Anion gap 8 5 - 15    Comment: Performed at [facility], [place] [address], [place], [place] [address]  Magnesium     Status: None   Collection Time: 06/11/21  6:01 AM  Result Value Ref Range   Magnesium 2.0 1.7 - 2.4 mg/dL    Comment: Performed at [facility], [place] [address] [place],  [address]  Glucose, capillary     Status: Abnormal   Collection Time: 06/11/21  7:44 AM  Result Value Ref Range   Glucose-Capillary 135 (H) 70 - 99 mg/dL    Comment: Glucose reference range applies only to samples taken after fasting for at least 8 hours.  Glucose, capillary     Status: Abnormal   Collection Time: 06/11/21 11:34 AM  Result Value Ref Range   Glucose-Capillary 135 (H) 70 - 99 mg/dL    Comment: Glucose reference range applies only to samples taken after fasting for at least 8 hours.  Glucose, capillary     Status: Abnormal   Collection Time: 06/11/21  4:33 PM  Result Value Ref Range   Glucose-Capillary 236 (H) 70 - 99 mg/dL    Comment: Glucose reference range applies only to samples taken after fasting for at least 8 hours.  Glucose, capillary      Status: Abnormal   Collection Time: 06/11/21  8:50 PM  Result Value Ref Range   Glucose-Capillary 182 (H) 70 - 99 mg/dL    Comment: Glucose reference range applies only to samples taken after fasting for at least 8 hours.   Comment 1 Notify RN    Comment 2 Document in Chart   Basic metabolic panel     Status: Abnormal   Collection Time: 06/12/21  5:59 AM  Result Value Ref Range   Sodium 136 135 - 145 mmol/L   Potassium 3.7 3.5 - 5.1 mmol/L   Chloride 101 98 - 111 mmol/L   CO2 28 22 - 32 mmol/L   Glucose, Bld 145 (H) 70 - 99 mg/dL    Comment: Glucose reference range applies only to samples taken after fasting for at least 8 hours.   BUN 16 6 - 20 mg/dL   Creatinine, Ser 0.54 0.44 - 1.00 mg/dL   Calcium 9.3 8.9 - 10.3 mg/dL   GFR, Estimated >60 >60 mL/min    Comment: (NOTE) Calculated using the CKD-EPI Creatinine Equation (2021)    Anion gap 7 5 - 15    Comment: Performed at [facility], [place] [address], [place], [place] [address]  Glucose, capillary     Status:  Abnormal   Collection Time: 06/12/21  7:48 AM  Result Value Ref Range   Glucose-Capillary 144 (H) 70 - 99 mg/dL    Comment: Glucose reference range applies only to samples taken after fasting for at least 8 hours.    ECG   N/A  Telemetry   Sinus rhythm - Personally Reviewed  Radiology    ECHOCARDIOGRAM COMPLETE  Result Date: 06/11/2021    ECHOCARDIOGRAM REPORT   Patient Name:   [name] Date of Exam: 06/11/2021 Medical Rec #:  [id number]      Height:       61.0 in Accession #:    [id number]     Weight:       175.0 lb Date of Birth:  March 21, 1966      BSA:          1.785 m Patient Age:    65 years       BP:           141/79 mmHg Patient Gender: F              HR:           67 bpm. Exam Location:  Inpatient Procedure: 2D Echo, Cardiac Doppler, Color Doppler and Intracardiac            Opacification Agent Indications:    NSVT (nonsustained ventricular tachycardia)  History:        Patient has  prior history of Echocardiogram examinations, most                 recent 06/07/2017. Risk Factors:Hypertension and Dyslipidemia.                 Influenza A. GERD.  Sonographer:    Darlina Sicilian RDCS Referring Phys: [employer]  1. Left ventricular ejection fraction, by estimation, is 65 to 70%. The left ventricle has normal function. The left ventricle has no regional wall motion abnormalities. There is mild concentric left ventricular hypertrophy. Left ventricular diastolic parameters are consistent with Grade I diastolic dysfunction (impaired relaxation).  2. Right ventricular systolic function is normal. The right ventricular size is normal. Tricuspid regurgitation signal is inadequate for assessing PA pressure.  3. The mitral valve is normal in structure. Trivial mitral valve regurgitation.  4. The aortic valve is tricuspid. There is mild calcification of the aortic valve. There is mild thickening of the aortic valve. Aortic valve regurgitation is not visualized.  5. The inferior vena cava is normal in size with greater than 50% respiratory variability, suggesting right atrial pressure of 3 mmHg. Comparison(s): Compared to prior TTE in 2018, there is no significant change. FINDINGS  Left Ventricle: Left ventricular ejection fraction, by estimation, is 65 to 70%. The left ventricle has normal function. The left ventricle has no regional wall motion abnormalities. Definity contrast agent was given IV to delineate the left ventricular  endocardial borders. The left ventricular internal cavity size was normal in size. There is mild concentric left ventricular hypertrophy. Left ventricular diastolic parameters are consistent with Grade I diastolic dysfunction (impaired relaxation). Right Ventricle: The right ventricular size is normal. No increase in right ventricular wall thickness. Right ventricular systolic function is normal. Tricuspid regurgitation signal is inadequate for assessing PA  pressure. Left Atrium: Left atrial size was normal in size. Right Atrium: Right atrial size was normal in size. Pericardium: There is no evidence of pericardial effusion. Mitral Valve: The mitral valve is normal in structure. Mild mitral annular calcification.  Trivial mitral valve regurgitation. Tricuspid Valve: The tricuspid valve is normal in structure. Tricuspid valve regurgitation is trivial. Aortic Valve: The aortic valve is tricuspid. There is mild calcification of the aortic valve. There is mild thickening of the aortic valve. Aortic valve regurgitation is not visualized. Pulmonic Valve: The pulmonic valve was not well visualized. Pulmonic valve regurgitation is trivial. Aorta: The aortic root and ascending aorta are structurally normal, with no evidence of dilitation. Venous: The inferior vena cava is normal in size with greater than 50% respiratory variability, suggesting right atrial pressure of 3 mmHg. IAS/Shunts: No atrial level shunt detected by color flow Doppler.  LEFT VENTRICLE PLAX 2D LVIDd:         4.90 cm      Diastology LVIDs:         2.80 cm      LV e' medial:    4.73 cm/s LV PW:         1.00 cm      LV E/e' medial:  14.1 LV IVS:        0.80 cm      LV e' lateral:   5.63 cm/s LVOT diam:     2.00 cm      LV E/e' lateral: 11.9 LV SV:         68 LV SV Index:   38 LVOT Area:     3.14 cm  LV Volumes (MOD) LV vol d, MOD A2C: 110.0 ml LV vol d, MOD A4C: 128.0 ml LV vol s, MOD A2C: 27.1 ml LV vol s, MOD A4C: 50.4 ml LV SV MOD A2C:     82.9 ml LV SV MOD A4C:     128.0 ml LV SV MOD BP:      83.5 ml RIGHT VENTRICLE RV S prime:     18.20 cm/s TAPSE (M-mode): 2.6 cm LEFT ATRIUM             Index        RIGHT ATRIUM          Index LA diam:        3.90 cm 2.19 cm/m   RA Area:     9.10 cm LA Vol (A2C):   31.3 ml 17.54 ml/m  RA Volume:   14.00 ml 7.85 ml/m LA Vol (A4C):   27.8 ml 15.58 ml/m LA Biplane Vol: 29.6 ml 16.59 ml/m  AORTIC VALVE LVOT Vmax:   108.00 cm/s LVOT Vmean:  66.700 cm/s LVOT VTI:     0.217 m  AORTA Ao Root diam: 2.80 cm Ao Asc diam:  3.40 cm MITRAL VALVE MV Area (PHT): 3.27 cm    SHUNTS MV Decel Time: 232 msec    Systemic VTI:  0.22 m MV E velocity: 66.80 cm/s  Systemic Diam: 2.00 cm MV A velocity: 72.40 cm/s MV E/A ratio:  0.92 Gwyndolyn Kaufman MD Electronically signed by Gwyndolyn Kaufman MD Signature Date/Time: 06/11/2021/3:59:58 PM    Final     Cardiac Studies   See above  Assessment   Principal Problem:   Influenza A Active Problems:   Hypertension   Iron deficiency anemia   Hypokalemia   Class 1 obesity   Prediabetes   Hyperlipidemia   GERD (gastroesophageal reflux disease)   Asthma exacerbation   Plan   Continue current therapy. Cardiac rhythm is stable. Keep electrolytes repleted. Echo is reassuring. No further cardiac work-up is recommended.  [facility] will sign off.   Medication Recommendations:  continue current meds Other recommendations (  labs, testing, etc):  none Follow up as an outpatient:  Dr. Debara Pickett or APP   Time Spent Directly with Patient:  I have spent a total of 25 minutes with the patient reviewing hospital notes, telemetry, EKGs, labs and examining the patient as well as establishing an assessment and plan that was discussed personally with the patient.  > 50% of time was spent in direct patient care.  Length of Stay:  LOS: 3 days   Pixie Casino, MD, [facility], [place] Director of the [facility]  [facility] Diplomate of the American Board of Clinical Lipidology Attending Cardiologist  Direct Dial: [phone]  Fax: [phone]  Website:  [email or web address] 06/12/2021, 8:02 AM

## 2021-06-12 NOTE — Progress Notes (Signed)
PROGRESS NOTE    [name]  VHQ:[id number] DOB: 1966/05/29 DOA: 06/07/2021 PCP: Gaylan Gerold, DO   Chief Complaint  Patient presents with   Generalized Body Aches   Chest Pain   Emesis   Cough   Chills    Brief Narrative:   [name] is a 55 y.o. female with medical history significant of anemia, mild intermittent asthma, left hip bursitis, GERD, hiatal hernia, history of gastric PUD, hypertension, migraine headaches, class I obesity presents to the emergency department with nausea, vomiting, chest pain, sob and cough. CXR showed possible developing trace left lower lobe airspace opacity.  CT chest 12/6 confirms consolidative airspace opacities.  Treating for pneumonia and influenza a.  Had episodes of NSVT on 12/7, transferred to progressive unit.  Cardiology consulted, evaluation unremarkable and signed off.  Discharge held due to reported dizziness and patient not feeling comfortable going home today.   Assessment & Plan:   Principal Problem:   Influenza A Active Problems:   Hypertension   Iron deficiency anemia   Hypokalemia   Class 1 obesity   Prediabetes   Hyperlipidemia   GERD (gastroesophageal reflux disease)   Asthma exacerbation   Community-acquired pneumonia complicating influenza A: - CT chest 12/6: Bilateral heterogeneous groundglass and consolidative airspace opacity, most conspicuously in the lower lobes but also seen in the right upper and middle lobes consistent with multifocal infection and/or aspiration. -Urine pneumococcal and Legionella antigen: Negative. -Since patient had history of severe allergic reaction to penicillin, she was started on levofloxacin, initially IV followed by p.o. and is completed 5 days course and stopped.   - Complete course of Tamiflu. - Weaned off to room air.  Hypoxia resolved. - Recommend repeating imaging, chest x-ray or even better CT chest in 4 weeks to ensure resolution of abnormal findings.  Dizziness -12/9:  When RN ambulated patient in the hallway, she reported dizziness the whole time.  Not orthostatic.  Patient thinks that maybe she has vertigo.  Dizziness worst when she turns down her head. -Unclear etiology.  May be related to URI with left ear congestion which she had reported earlier on. -No further indication for continued IV or p.o. opioids which may be contributing and were discontinued. -Added as needed meclizine and consulted PT for vestibular rehab. -Reassess in a.m.  Mild hemoptysis 10/6.  Probably from acute bronchitis and multifocal pneumonia.  Resolved Since she reported dysphagia, speech therapy evaluated and recommended regular diet and thin liquids  Influenza A/ Mild Asthma exacerbation: Has another 24 hours of Tamiflu to complete total 5 days course. Has completed a course of prednisone. Supportive treatment with antitussives, as needed bronchodilator nebs, pain management  Clinically improved.  Asthma exacerbation has resolved.  Atypical chest pain Likely musculoskeletal due to coughing from respiratory illness as noted above.  Other differential, GERD.  Low index of suspicion for ACS or PE at this time. Echo in 2018 had normal EF.  Repeat echo 12/8: LVEF 65-70% no regional wall motion abnormalities.  Grade 1 diastolic dysfunction. EKG 12/7 personally reviewed, NSR at 64 bpm, normal axis, no acute abnormalities.  QTc 420 ms.  High-sensitivity troponin x2: Negative. -Reports no significant improvement with SL NTG yesterday or with Maalox. -Cardiology consultation appreciated, indicate rate dependent BBB, reproducible chest pain and do not think that she needs additional work-up. -Resolved.  Cardiology signed off.  NSVT 12 beat NSVT x2 on 12/7.  High-sensitivity troponins x2 negative.  Echo with preserved LVEF. Replacing potassium and magnesium, aim  to keep potassium >4 and magnesium >2. Continue telemetry. As per cardiology consultation, appears to have rate related BBB  and do not recommend any further evaluation. Has not had any further episodes since 12/7.  Hypokalemia:  Potassium 3.7, replace as noted above.  Magnesium: 2.  Follow BMP in AM.  Hypertension:   well controlled.   GERD/small hiatal hernia on CT chest:  Severe as per the patient, continue Protonix.  Added Maalox/Mylanta.  Nausea, vomiting, Abdominal pain.  Symptomatic management with anti emetics, cough meds and pain control.  May be related to her influenza.   Appears to have resolved.  Normocytic anemia/iron deficiency: Anemia panel: Iron 12, TIBC 395, saturation ratio 3, ferritin 50, folate 12.2, B12: 367 and absolute reticulocyte count 51 Start ferrous sulfate as outpatient. Stable in the 10 g range.  Outpatient follow-up.    Constipation Initiateded bowel regimen.  Prediabetes A1c 6.  Improved after steroid stoppage.   DVT prophylaxis: Lovenox Code Status: Full code Family Communication: None at bedside Disposition:   Status is: Inpatient  DC home, hopefully 12/10 pending improvement in her dizziness.      Consultants:  Cardiology  Procedures:  None.   Antimicrobials:  Completed course of levofloxacin 12/5 > 12/9 Tamiflu 12/5 >  Subjective:  Objective: Vitals:   06/11/21 2052 06/12/21 0610 06/12/21 1345 06/12/21 1357  BP: (!) 146/87 (!) 149/81 (!) 154/90 (!) 154/90  Pulse: 70 79  76  Resp: 17 18    Temp: 97.7 F (36.5 C) 98.5 F (36.9 C)  98.4 F (36.9 C)  TempSrc: Oral Oral  Oral  SpO2: 98% 100%  98%  Weight:      Height:        Intake/Output Summary (Last 24 hours) at 06/12/2021 1442 Last data filed at 06/12/2021 0846 Gross per 24 hour  Intake 240 ml  Output --  Net 240 ml    Filed Weights   06/08/21 1615  Weight: 79.4 kg    Examination:  General exam: Young female, moderately built and nourished lying comfortably supine in bed without distress. Respiratory system: Clear to auscultation without wheezing, rhonchi or crackles.  No  increased work of breathing.  Able to speak in full sentences. Cardiovascular system: S1 and S2 heard, RRR.  No JVD, murmurs or pedal edema.  Telemetry personally reviewed: Sinus rhythm.  No further NSVT's. Gastrointestinal system: Abdomen is nondistended, soft and nontender.  Normal bowel sounds heard. Central nervous system: Alert and oriented. No focal neurological deficits. Extremities: Symmetric 5 x 5 power. Skin: No rashes, lesions or ulcers Psychiatry: Appropriate Ears: Otoscopy on 12/7 , left ear without acute findings.  Right ear difficult to fully examine due to cerumen in the external auditory canal but did not see any appreciable acute findings.    Data Reviewed: I have personally reviewed following labs and imaging studies  CBC: Recent Labs  Lab 06/08/21 0311 06/09/21 0514 06/10/21 1611 06/11/21 0509  WBC 11.1* 10.0 8.0 8.9  NEUTROABS 10.1*  --   --   --   HGB 10.5* 9.9* 10.8* 10.9*  HCT 31.4* 30.3* 32.7* 33.4*  MCV 85.8 87.1 86.7 86.8  PLT 244 229 267 469    Basic Metabolic Panel: Recent Labs  Lab 06/08/21 0311 06/09/21 0514 06/10/21 1611 06/11/21 0601 06/12/21 0559  NA 135 136 136 137 136  K 3.1* 4.0 3.7 3.6 3.7  CL 100 110 104 103 101  CO2 25 23 24 26 28   GLUCOSE 161* 147* 231* 130* 145*  BUN 12 11 8 10 16   CREATININE 0.67 0.47 0.54 0.52 0.54  CALCIUM 9.4 9.3 9.4 9.2 9.3  MG  --   --  1.8 2.0  --     GFR: Estimated Creatinine Clearance: 77.5 mL/min (by C-G formula based on SCr of 0.54 mg/dL).  Liver Function Tests: Recent Labs  Lab 06/08/21 0311  AST 18  ALT 17  ALKPHOS 50  BILITOT 0.6  PROT 7.6  ALBUMIN 4.1    CBG: Recent Labs  Lab 06/11/21 1134 06/11/21 1633 06/11/21 2050 06/12/21 0748 06/12/21 1117  GLUCAP 135* 236* 182* 144* 146*     Recent Results (from the past 240 hour(s))  Resp Panel by RT-PCR (Flu A&B, Covid) Nasopharyngeal Swab     Status: Abnormal   Collection Time: 06/07/21  9:54 PM   Specimen: Nasopharyngeal  Swab; Nasopharyngeal(NP) swabs in vial transport medium  Result Value Ref Range Status   SARS Coronavirus 2 by RT PCR NEGATIVE NEGATIVE Final    Comment: (NOTE) SARS-CoV-2 target nucleic acids are NOT DETECTED.  The SARS-CoV-2 RNA is generally detectable in upper respiratory specimens during the acute phase of infection. The lowest concentration of SARS-CoV-2 viral copies this assay can detect is 138 copies/mL. A negative result does not preclude SARS-Cov-2 infection and should not be used as the sole basis for treatment or other patient management decisions. A negative result may occur with  improper specimen collection/handling, submission of specimen other than nasopharyngeal swab, presence of viral mutation(s) within the areas targeted by this assay, and inadequate number of viral copies(<138 copies/mL). A negative result must be combined with clinical observations, patient history, and epidemiological information. The expected result is Negative.  Fact Sheet for Patients:  EntrepreneurPulse.com.au  Fact Sheet for Healthcare Providers:  IncredibleEmployment.be  This test is no t yet approved or cleared by the [place] FDA and  has been authorized for detection and/or diagnosis of SARS-CoV-2 by FDA under an Emergency Use Authorization (EUA). This EUA will remain  in effect (meaning this test can be used) for the duration of the COVID-19 declaration under Section 564(b)(1) of the Act, 21 U.S.C.section 360bbb-3(b)(1), unless the authorization is terminated  or revoked sooner.       Influenza A by PCR POSITIVE (A) NEGATIVE Final   Influenza B by PCR NEGATIVE NEGATIVE Final    Comment: (NOTE) The Xpert Xpress SARS-CoV-2/FLU/RSV plus assay is intended as an aid in the diagnosis of influenza from Nasopharyngeal swab specimens and should not be used as a sole basis for treatment. Nasal washings and aspirates are unacceptable for Xpert  Xpress SARS-CoV-2/FLU/RSV testing.  Fact Sheet for Patients: EntrepreneurPulse.com.au  Fact Sheet for Healthcare Providers: IncredibleEmployment.be  This test is not yet approved or cleared by the [place] FDA and has been authorized for detection and/or diagnosis of SARS-CoV-2 by FDA under an Emergency Use Authorization (EUA). This EUA will remain in effect (meaning this test can be used) for the duration of the COVID-19 declaration under Section 564(b)(1) of the Act, 21 U.S.C. section 360bbb-3(b)(1), unless the authorization is terminated or revoked.  Performed at [facility], [place] [address] [place], [place] [address]          Radiology Studies: ECHOCARDIOGRAM COMPLETE  Result Date: 06/11/2021    ECHOCARDIOGRAM REPORT   Patient Name:   [name] Date of Exam: 06/11/2021 Medical Rec #:  [id number]      Height:       61.0 in Accession #:  [id number]     Weight:       175.0 lb Date of Birth:  1966-04-27      BSA:          1.785 m Patient Age:    80 years       BP:           141/79 mmHg Patient Gender: F              HR:           67 bpm. Exam Location:  Inpatient Procedure: 2D Echo, Cardiac Doppler, Color Doppler and Intracardiac            Opacification Agent Indications:    NSVT (nonsustained ventricular tachycardia)  History:        Patient has prior history of Echocardiogram examinations, most                 recent 06/07/2017. Risk Factors:Hypertension and Dyslipidemia.                 Influenza A. GERD.  Sonographer:    Darlina Sicilian RDCS Referring Phys: [place]  1. Left ventricular ejection fraction, by estimation, is 65 to 70%. The left ventricle has normal function. The left ventricle has no regional wall motion abnormalities. There is mild concentric left ventricular hypertrophy. Left ventricular diastolic parameters are consistent with Grade I diastolic dysfunction (impaired relaxation).   2. Right ventricular systolic function is normal. The right ventricular size is normal. Tricuspid regurgitation signal is inadequate for assessing PA pressure.  3. The mitral valve is normal in structure. Trivial mitral valve regurgitation.  4. The aortic valve is tricuspid. There is mild calcification of the aortic valve. There is mild thickening of the aortic valve. Aortic valve regurgitation is not visualized.  5. The inferior vena cava is normal in size with greater than 50% respiratory variability, suggesting right atrial pressure of 3 mmHg. Comparison(s): Compared to prior TTE in 2018, there is no significant change. FINDINGS  Left Ventricle: Left ventricular ejection fraction, by estimation, is 65 to 70%. The left ventricle has normal function. The left ventricle has no regional wall motion abnormalities. Definity contrast agent was given IV to delineate the left ventricular  endocardial borders. The left ventricular internal cavity size was normal in size. There is mild concentric left ventricular hypertrophy. Left ventricular diastolic parameters are consistent with Grade I diastolic dysfunction (impaired relaxation). Right Ventricle: The right ventricular size is normal. No increase in right ventricular wall thickness. Right ventricular systolic function is normal. Tricuspid regurgitation signal is inadequate for assessing PA pressure. Left Atrium: Left atrial size was normal in size. Right Atrium: Right atrial size was normal in size. Pericardium: There is no evidence of pericardial effusion. Mitral Valve: The mitral valve is normal in structure. Mild mitral annular calcification. Trivial mitral valve regurgitation. Tricuspid Valve: The tricuspid valve is normal in structure. Tricuspid valve regurgitation is trivial. Aortic Valve: The aortic valve is tricuspid. There is mild calcification of the aortic valve. There is mild thickening of the aortic valve. Aortic valve regurgitation is not visualized.  Pulmonic Valve: The pulmonic valve was not well visualized. Pulmonic valve regurgitation is trivial. Aorta: The aortic root and ascending aorta are structurally normal, with no evidence of dilitation. Venous: The inferior vena cava is normal in size with greater than 50% respiratory variability, suggesting right atrial pressure of 3 mmHg. IAS/Shunts: No atrial level shunt detected by color flow Doppler.  LEFT VENTRICLE  PLAX 2D LVIDd:         4.90 cm      Diastology LVIDs:         2.80 cm      LV e' medial:    4.73 cm/s LV PW:         1.00 cm      LV E/e' medial:  14.1 LV IVS:        0.80 cm      LV e' lateral:   5.63 cm/s LVOT diam:     2.00 cm      LV E/e' lateral: 11.9 LV SV:         68 LV SV Index:   38 LVOT Area:     3.14 cm  LV Volumes (MOD) LV vol d, MOD A2C: 110.0 ml LV vol d, MOD A4C: 128.0 ml LV vol s, MOD A2C: 27.1 ml LV vol s, MOD A4C: 50.4 ml LV SV MOD A2C:     82.9 ml LV SV MOD A4C:     128.0 ml LV SV MOD BP:      83.5 ml RIGHT VENTRICLE RV S prime:     18.20 cm/s TAPSE (M-mode): 2.6 cm LEFT ATRIUM             Index        RIGHT ATRIUM          Index LA diam:        3.90 cm 2.19 cm/m   RA Area:     9.10 cm LA Vol (A2C):   31.3 ml 17.54 ml/m  RA Volume:   14.00 ml 7.85 ml/m LA Vol (A4C):   27.8 ml 15.58 ml/m LA Biplane Vol: 29.6 ml 16.59 ml/m  AORTIC VALVE LVOT Vmax:   108.00 cm/s LVOT Vmean:  66.700 cm/s LVOT VTI:    0.217 m  AORTA Ao Root diam: 2.80 cm Ao Asc diam:  3.40 cm MITRAL VALVE MV Area (PHT): 3.27 cm    SHUNTS MV Decel Time: 232 msec    Systemic VTI:  0.22 m MV E velocity: 66.80 cm/s  Systemic Diam: 2.00 cm MV A velocity: 72.40 cm/s MV E/A ratio:  0.92 Gwyndolyn Kaufman MD Electronically signed by Gwyndolyn Kaufman MD Signature Date/Time: 06/11/2021/3:59:58 PM    Final         Scheduled Meds:  amLODipine  10 mg Oral Daily   benzonatate  200 mg Oral TID   enoxaparin (LOVENOX) injection  40 mg Subcutaneous Q24H   famotidine  20 mg Oral Daily   insulin aspart  0-20 Units  Subcutaneous TID WC   loratadine  10 mg Oral Daily   oseltamivir  75 mg Oral BID   pantoprazole  40 mg Oral Daily   polyethylene glycol  17 g Oral Daily   senna  2 tablet Oral Daily   Continuous Infusions:     LOS: 3 days        Vernell Leep, MD [facility]   To contact the attending provider between 7A-7P or the covering provider during after hours 7P-7A, please log into the web site [email or web address] and access using universal [place] password for that web site. If you do not have the password, please call the hospital operator.  06/12/2021, 2:42 PM

## 2021-06-12 NOTE — Progress Notes (Signed)
Pt was upset because she said he has not been able to use the inhaler like she does at home. She said she took albuterol inhaler two days. PRN order placed for Albuterol inhaler per pts request. Gave pt PRN albuterol. Pt is resting no resp distress noted.

## 2021-06-12 NOTE — TOC Initial Note (Signed)
Transition of Care [facility]) - Initial/Assessment Note    Patient Details  Name: [name] MRN: [id number] Date of Birth: 1966-04-25  [facility]) CM/SW Contact:    Dessa Phi, RN Phone Number: 06/12/2021, 12:41 PM  Clinical Narrative: Spoke to patient-d/c plan home.Has pcp,pharmacy,own transport home,works,financial counselor assessing for charity. No anticipated d/c needs.will continue to follow.                 Expected Discharge Plan: Home/Self Care Barriers to Discharge: Continued Medical Work up   Patient Goals and CMS Choice Patient states their goals for this hospitalization and ongoing recovery are:: go home CMS Medicare.gov Compare Post Acute Care list provided to:: Patient Choice offered to / list presented to : Patient  Expected Discharge Plan and Services Expected Discharge Plan: Home/Self Care   Discharge Planning Services: CM Consult   Living arrangements for the past 2 months: Single Family Home                                      Prior Living Arrangements/Services Living arrangements for the past 2 months: Single Family Home Lives with:: Self Patient language and need for interpreter reviewed:: Yes Do you feel safe going back to the place where you live?: Yes      Need for Family Participation in Patient Care: No (Comment) Care giver support system in place?: Yes (comment)   Criminal Activity/Legal Involvement Pertinent to Current Situation/Hospitalization: No - Comment as needed  Activities of Daily Living Home Assistive Devices/Equipment: None ADL Screening (condition at time of admission) Patient's cognitive ability adequate to safely complete daily activities?: Yes Is the patient deaf or have difficulty hearing?: No Does the patient have difficulty seeing, even when wearing glasses/contacts?: No Does the patient have difficulty concentrating, remembering, or making decisions?: No Patient able to express need for  assistance with ADLs?: No Does the patient have difficulty dressing or bathing?: Yes Independently performs ADLs?: Yes (appropriate for developmental age) Does the patient have difficulty walking or climbing stairs?: Yes Weakness of Legs: Both Weakness of Arms/Hands: None  Permission Sought/Granted Permission sought to share information with : Case Manager Permission granted to share information with : Yes, Verbal Permission Granted  Share Information with NAME: Case manager           Emotional Assessment Appearance:: Appears stated age Attitude/Demeanor/Rapport: Gracious Affect (typically observed): Accepting Orientation: : Oriented to Self, Oriented to Place, Oriented to  Time, Oriented to Situation Alcohol / Substance Use: Not Applicable Psych Involvement: No (comment)  Admission diagnosis:  Dehydration [E86.0] Hypokalemia [E87.6] Influenza A [J10.1] Influenza [J11.1] Community acquired pneumonia of left lower lobe of lung [J18.9] Patient Active Problem List   Diagnosis Date Noted   Influenza A 06/08/2021   Asthma exacerbation 06/08/2021   Screening for cervical cancer 08/20/2020   Healthcare maintenance 08/20/2020   Chronic right-sided low back pain without sciatica 08/20/2020   Adjustment disorder with disturbance of conduct    MDD (major depressive disorder), severe ([place]) 02/03/2019   Radiculopathy affecting upper extremity 01/17/2019   Asthma, not well controlled, mild intermittent, with acute exacerbation 02/20/2018   Osteoarthritis of right hip 12/23/2016   GERD (gastroesophageal reflux disease) 03/08/2015   Encounter for screening mammogram for malignant neoplasm of breast 03/08/2015   History of cholecystectomy 03/08/2015   Prediabetes 08/05/2014   Hyperlipidemia 08/05/2014   Hypertension 08/04/2014   Iron deficiency anemia  08/04/2014   Hypokalemia 08/04/2014   Class 1 obesity 08/04/2014   History of diverticulitis 10/16/2013   PCP:  Gaylan Gerold,  DO Pharmacy:   [place], [place] - [address] [place] [address] Phone: [phone] Fax: [phone]  [id number] - [place], [place] [address] Phone: [phone] Fax: [phone]     Social Determinants of Health (SDOH) Interventions    Readmission Risk Interventions No flowsheet data found.

## 2021-06-13 ENCOUNTER — Inpatient Hospital Stay (HOSPITAL_COMMUNITY): Payer: Self-pay

## 2021-06-13 LAB — GLUCOSE, CAPILLARY
Glucose-Capillary: 179 mg/dL — ABNORMAL HIGH (ref 70–99)
Glucose-Capillary: 237 mg/dL — ABNORMAL HIGH (ref 70–99)
Glucose-Capillary: 105 mg/dL — ABNORMAL HIGH (ref 70–99)
Glucose-Capillary: 236 mg/dL — ABNORMAL HIGH (ref 70–99)

## 2021-06-13 LAB — BASIC METABOLIC PANEL
Anion gap: 5 (ref 5–15)
BUN: 16 mg/dL (ref 6–20)
CO2: 31 mmol/L (ref 22–32)
Calcium: 9.3 mg/dL (ref 8.9–10.3)
Chloride: 101 mmol/L (ref 98–111)
Creatinine, Ser: 0.64 mg/dL (ref 0.44–1.00)
GFR, Estimated: >60 mL/min (ref >60)
Glucose, Bld: 141 mg/dL — ABNORMAL HIGH (ref 70–99)
Potassium: 3.7 mmol/L (ref 3.5–5.1)
Sodium: 137 mmol/L (ref 135–145)

## 2021-06-13 LAB — MAGNESIUM: Magnesium: 1.8 mg/dL (ref 1.7–2.4)

## 2021-06-13 NOTE — Progress Notes (Signed)
PROGRESS NOTE  [name] JSH:[id number] DOB: 1966-01-10 DOA: 06/07/2021 PCP: Gaylan Gerold, DO  HPI/Recap of past 72 hours: 55 year old female with history of asthma who was admitted for multifactorial pneumonia on CT chest complicated by influenza A.  She has completed antibiotics.  She had a brief nonsustained VT tach and twice and was transferred to telemetry cardiology evaluated and signed off.  Subjective: Patient seen and examined at bedside, She complains that she still feeling dizzy She stated she was seen by physical therapy for assessment and patient stated she has had no change in her symptoms and she is still dizzy when she tries to get up She was wondering if the infiltrate in her lung by chest x-ray has resolved.  She is afebrile but she still coughing she still has a cold  Assessment/Plan: Principal Problem:   Influenza A Active Problems:   Hypertension   Iron deficiency anemia   Hypokalemia   Class 1 obesity   Prediabetes   Hyperlipidemia   GERD (gastroesophageal reflux disease)   Asthma exacerbation  Influenza A.  Patient has completed Tamiflu  Multifactorial pneumonia.  Patient has completed antibiotics She still coughing.  We will repeat Pete chest x-ray   Vertigo patient still feeling very generous he received physical therapy for vertigo treatment but states it has not helped.  Patient was going to be discharged today but she feels like she is not ready yet because nothing has changed with her dizziness Will continue on hydrating and current therapy.  Possible discharge in the morning if she felt better  Hypertension slightly elevated Continue current therapy   Mild hyperglycemia Monitor carbohydrate intake   Normocytic anemia/iron deficiency anemia Patient was started on ferrous sulfate as outpatient Patient to follow-up as outpatient    Code Status: Full  Severity of Illness: The appropriate patient status for this patient is INPATIENT.  Inpatient status is judged to be reasonable and necessary in order to provide the required intensity of service to ensure the patient's safety. The patient's presenting symptoms, physical exam findings, and initial radiographic and laboratory data in the context of their chronic comorbidities is felt to place them at high risk for further clinical deterioration. Furthermore, it is not anticipated that the patient will be medically stable for discharge from the hospital within 2 midnights of admission.   * I certify that at the point of admission it is my clinical judgment that the patient will require inpatient hospital care spanning beyond 2 midnights from the point of admission due to high intensity of service, high risk for further deterioration and high frequency of surveillance required.*   Family Communication: None at bedside  Disposition Plan: Home in 1 to 2 days Status is: Inpatient   Dispo: The patient is from: Home              Anticipated d/c is to:               Anticipated d/c date is:               Patient currently not medically stable for discharge  Consultants: Cardiology  Procedures: None Antimicrobials: Completed Tamiflu Completed levofloxacin from December 5 to December 9  DVT prophylaxis: Lovenox   Objective: Vitals:   06/12/21 1357 06/12/21 2120 06/12/21 2310 06/13/21 0602  BP: (!) 154/90 133/71  (!) 159/83  Pulse: 76 71  70  Resp: 18 17  20   Temp: 98.4 F (36.9 C) 98.3 F (36.8 C)  98.5  F (36.9 C)  TempSrc: Oral   Oral  SpO2: 98% 100% 100% 96%  Weight:      Height:        Intake/Output Summary (Last 24 hours) at 06/13/2021 1032 Last data filed at 06/12/2021 1930 Gross per 24 hour  Intake 360 ml  Output --  Net 360 ml   Filed Weights   06/08/21 1615  Weight: 79.4 kg   Body mass index is 33.07 kg/m.  Exam:  General: 55 y.o. year-old female well developed well nourished in no acute distress.  Alert and oriented x3. Cardiovascular:  Regular rate and rhythm with no rubs or gallops.  No thyromegaly or JVD noted.   Respiratory: Clear to auscultation with no wheezes or rales. Good inspiratory effort. Abdomen: Soft nontender nondistended with normal bowel sounds x4 quadrants. Musculoskeletal: No lower extremity edema. 2/4 pulses in all 4 extremities. Skin: No ulcerative lesions noted or rashes, Psychiatry: Mood is appropriate for condition and setting Neurology:    Data Reviewed: CBC: Recent Labs  Lab 06/08/21 0311 06/09/21 0514 06/10/21 1611 06/11/21 0509  WBC 11.1* 10.0 8.0 8.9  NEUTROABS 10.1*  --   --   --   HGB 10.5* 9.9* 10.8* 10.9*  HCT 31.4* 30.3* 32.7* 33.4*  MCV 85.8 87.1 86.7 86.8  PLT 244 229 267 725   Basic Metabolic Panel: Recent Labs  Lab 06/09/21 0514 06/10/21 1611 06/11/21 0601 06/12/21 0559 06/13/21 0511  NA 136 136 137 136 137  K 4.0 3.7 3.6 3.7 3.7  CL 110 104 103 101 101  CO2 23 24 26 28 31   GLUCOSE 147* 231* 130* 145* 141*  BUN 11 8 10 16 16   CREATININE 0.47 0.54 0.52 0.54 0.64  CALCIUM 9.3 9.4 9.2 9.3 9.3  MG  --  1.8 2.0  --  1.8   GFR: Estimated Creatinine Clearance: 77.5 mL/min (by C-G formula based on SCr of 0.64 mg/dL). Liver Function Tests: Recent Labs  Lab 06/08/21 0311  AST 18  ALT 17  ALKPHOS 50  BILITOT 0.6  PROT 7.6  ALBUMIN 4.1   No results for input(s): LIPASE, AMYLASE in the last 168 hours. No results for input(s): AMMONIA in the last 168 hours. Coagulation Profile: No results for input(s): INR, PROTIME in the last 168 hours. Cardiac Enzymes: No results for input(s): CKTOTAL, CKMB, CKMBINDEX, TROPONINI in the last 168 hours. BNP (last 3 results) No results for input(s): PROBNP in the last 8760 hours. HbA1C: No results for input(s): HGBA1C in the last 72 hours. CBG: Recent Labs  Lab 06/12/21 0748 06/12/21 1117 06/12/21 1551 06/12/21 2122 06/13/21 0726  GLUCAP 144* 146* 136* 241* 179*   Lipid Profile: No results for input(s): CHOL, HDL,  LDLCALC, TRIG, CHOLHDL, LDLDIRECT in the last 72 hours. Thyroid Function Tests: No results for input(s): TSH, T4TOTAL, FREET4, T3FREE, THYROIDAB in the last 72 hours. Anemia Panel: No results for input(s): VITAMINB12, FOLATE, FERRITIN, TIBC, IRON, RETICCTPCT in the last 72 hours. Urine analysis:    Component Value Date/Time   COLORURINE YELLOW 05/20/2021 1539   APPEARANCEUR HAZY (A) 05/20/2021 1539   APPEARANCEUR Cloudy (A) 03/01/2017 0940   LABSPEC 1.022 05/20/2021 1539   PHURINE 6.0 05/20/2021 1539   GLUCOSEU NEGATIVE 05/20/2021 1539   HGBUR LARGE (A) 05/20/2021 1539   BILIRUBINUR NEGATIVE 05/20/2021 1539   BILIRUBINUR Negative 03/01/2017 0940   KETONESUR NEGATIVE 05/20/2021 1539   PROTEINUR 30 (A) 05/20/2021 1539   UROBILINOGEN 1.0 08/04/2014 1119   NITRITE POSITIVE (A) 05/20/2021  [place] 05/20/2021 1539   Sepsis Labs: @LABRCNTIP (procalcitonin:4,lacticidven:4)  ) Recent Results (from the past 240 hour(s))  Resp Panel by RT-PCR (Flu A&B, Covid) Nasopharyngeal Swab     Status: Abnormal   Collection Time: 06/07/21  9:54 PM   Specimen: Nasopharyngeal Swab; Nasopharyngeal(NP) swabs in vial transport medium  Result Value Ref Range Status   SARS Coronavirus 2 by RT PCR NEGATIVE NEGATIVE Final    Comment: (NOTE) SARS-CoV-2 target nucleic acids are NOT DETECTED.  The SARS-CoV-2 RNA is generally detectable in upper respiratory specimens during the acute phase of infection. The lowest concentration of SARS-CoV-2 viral copies this assay can detect is 138 copies/mL. A negative result does not preclude SARS-Cov-2 infection and should not be used as the sole basis for treatment or other patient management decisions. A negative result may occur with  improper specimen collection/handling, submission of specimen other than nasopharyngeal swab, presence of viral mutation(s) within the areas targeted by this assay, and inadequate number of viral copies(<138  copies/mL). A negative result must be combined with clinical observations, patient history, and epidemiological information. The expected result is Negative.  Fact Sheet for Patients:  EntrepreneurPulse.com.au  Fact Sheet for Healthcare Providers:  IncredibleEmployment.be  This test is no t yet approved or cleared by the [place] FDA and  has been authorized for detection and/or diagnosis of SARS-CoV-2 by FDA under an Emergency Use Authorization (EUA). This EUA will remain  in effect (meaning this test can be used) for the duration of the COVID-19 declaration under Section 564(b)(1) of the Act, 21 U.S.C.section 360bbb-3(b)(1), unless the authorization is terminated  or revoked sooner.       Influenza A by PCR POSITIVE (A) NEGATIVE Final   Influenza B by PCR NEGATIVE NEGATIVE Final    Comment: (NOTE) The Xpert Xpress SARS-CoV-2/FLU/RSV plus assay is intended as an aid in the diagnosis of influenza from Nasopharyngeal swab specimens and should not be used as a sole basis for treatment. Nasal washings and aspirates are unacceptable for Xpert Xpress SARS-CoV-2/FLU/RSV testing.  Fact Sheet for Patients: EntrepreneurPulse.com.au  Fact Sheet for Healthcare Providers: IncredibleEmployment.be  This test is not yet approved or cleared by the [place] FDA and has been authorized for detection and/or diagnosis of SARS-CoV-2 by FDA under an Emergency Use Authorization (EUA). This EUA will remain in effect (meaning this test can be used) for the duration of the COVID-19 declaration under Section 564(b)(1) of the Act, 21 U.S.C. section 360bbb-3(b)(1), unless the authorization is terminated or revoked.  Performed at [facility], [place] [address] [place], [place] [address]       Studies: No results found.  Scheduled Meds:  amLODipine  10 mg Oral Daily   benzonatate  200 mg Oral TID    enoxaparin (LOVENOX) injection  40 mg Subcutaneous Q24H   famotidine  20 mg Oral Daily   insulin aspart  0-20 Units Subcutaneous TID WC   loratadine  10 mg Oral Daily   pantoprazole  40 mg Oral Daily   polyethylene glycol  17 g Oral Daily   senna  2 tablet Oral Daily    Continuous Infusions:   LOS: 4 days     Cristal Deer, MD [facility]  To reach me or the doctor on call, go to: [email or web address] Password [facility]  06/13/2021, 10:32 AM

## 2021-06-13 NOTE — Plan of Care (Signed)
  Problem: Education: Goal: Knowledge of General Education information will improve Description Including pain rating scale, medication(s)/side effects and non-pharmacologic comfort measures Outcome: Progressing   Problem: Health Behavior/Discharge Planning: Goal: Ability to manage health-related needs will improve Outcome: Progressing   

## 2021-06-13 NOTE — Evaluation (Signed)
Physical Therapy One Time Evaluation Patient Details Name: [name] MRN: [id number] DOB: 07/16/1965 Today's Date: 06/13/2021  History of Present Illness  Pt is a 55 year old female admitted 06/07/21 for Community-acquired pneumonia complicating influenza A.  PMHx includes asthma, obesity, anemia, GERD, HTN, Rt THA, left hip bursitis  Clinical Impression  Patient evaluated by Physical Therapy with no further acute PT needs identified. All education has been completed and the patient has no further questions.  Pt reports dizziness has improved today after receiving her inhaler last night.  Pt declines to mobilize with therapist today however allowed vestibular testing (as below).  Modified dix-hallpike performed, and pt denied symptoms and no nystagmus observed.  Summarizing vestibular exam, no significant findings identified.  SpO2 on room air 100% end of session.  Pt feels comfortable with d/c home and states she has RW and BSC if needed.  See below for any follow-up Physical Therapy or equipment needs. PT is signing off. Thank you for this referral.    06/13/21 1229  Vestibular Assessment  General Observation Pt reports dizziness started approx 2 days ago while she was in the hospital.  Pt mostly explaining she has not had her inhaler or breathing treatments as she would normall take at home if needed and states her symptoms are improved today after receiving those last night.  Symptom Behavior  Subjective history of current problem dizziness/faint feeling with mobility only  Type of Dizziness  Lightheadedness  Frequency of Dizziness only when standing or walking  Duration of Dizziness as long as up  Symptom Nature Motion provoked  Aggravating Factors Activity in general  Relieving Factors Rest  Progression of Symptoms Better  Oculomotor Exam  Oculomotor Alignment Normal  Spontaneous Absent  Gaze-induced  Absent  Head shaking Horizontal Absent  Head Shaking Vertical Absent  Smooth  Pursuits Intact  Saccades Intact        Recommendations for follow up therapy are one component of a multi-disciplinary discharge planning process, led by the attending physician.  Recommendations may be updated based on patient status, additional functional criteria and insurance authorization.  Follow Up Recommendations No PT follow up    Assistance Recommended at Discharge None  Functional Status Assessment Patient has not had a recent decline in their functional status  Equipment Recommendations  None recommended by PT    Recommendations for Other Services       Precautions / Restrictions Precautions Precautions: None      Mobility  Bed Mobility Overal bed mobility: Independent                  Transfers                   General transfer comment: pt reports ambulating in room multiple times a day, declined OOB at this time    Ambulation/Gait                  Stairs            Wheelchair Mobility    Modified Rankin (Stroke Patients Only)       Balance                                             Pertinent Vitals/Pain Pain Assessment: No/denies pain    Home Living Family/patient expects to be discharged to:: Private residence Living Arrangements:  Alone   Type of Home: House Home Access: Stairs to enter   [employer] of Steps: 2-3   Home Layout: One level Home Equipment: Conservation officer, nature (2 wheels);BSC/3in1      Prior Function Prior Level of Function : Independent/Modified Independent                     Hand Dominance        Extremity/Trunk Assessment        Lower Extremity Assessment Lower Extremity Assessment: Generalized weakness    Cervical / Trunk Assessment Cervical / Trunk Assessment: Normal  Communication   Communication: No difficulties  Cognition Arousal/Alertness: Awake/alert Behavior During Therapy: WFL for tasks assessed/performed Overall Cognitive  Status: Within Functional Limits for tasks assessed                                          General Comments      Exercises     Assessment/Plan    PT Assessment Patient does not need any further PT services  PT Problem List         PT Treatment Interventions      PT Goals (Current goals can be found in the Care Plan section)  Acute Rehab PT Goals PT Goal Formulation: All assessment and education complete, DC therapy    Frequency     Barriers to discharge        Co-evaluation               AM-PAC PT "6 Clicks" Mobility  Outcome Measure Help needed turning from your back to your side while in a flat bed without using bedrails?: None Help needed moving from lying on your back to sitting on the side of a flat bed without using bedrails?: None Help needed moving to and from a bed to a chair (including a wheelchair)?: None Help needed standing up from a chair using your arms (e.g., wheelchair or bedside chair)?: None Help needed to walk in hospital room?: A Little Help needed climbing 3-5 steps with a railing? : A Little 6 Click Score: 22    End of Session Equipment Utilized During Treatment: Gait belt Activity Tolerance: Patient tolerated treatment well Patient left: with call bell/phone within reach;in bed   PT Visit Diagnosis: Dizziness and giddiness (R42)    Time: 0258-5277 PT Time Calculation (min) (ACUTE ONLY): 19 min   Charges:   PT Evaluation $PT Eval Low Complexity: 1 Low     Kati PT, DPT Acute Rehabilitation Services Pager: [phone] Office: [place] 06/13/2021, 12:29 PM

## 2021-06-14 DIAGNOSIS — I700 Atherosclerosis of aorta: Secondary | ICD-10-CM

## 2021-06-14 DIAGNOSIS — J188 Other pneumonia, unspecified organism: Secondary | ICD-10-CM

## 2021-06-14 DIAGNOSIS — R42 Dizziness and giddiness: Secondary | ICD-10-CM

## 2021-06-14 DIAGNOSIS — J189 Pneumonia, unspecified organism: Secondary | ICD-10-CM

## 2021-06-14 LAB — GLUCOSE, CAPILLARY: Glucose-Capillary: 187 mg/dL — ABNORMAL HIGH (ref 70–99)

## 2021-06-14 NOTE — Assessment & Plan Note (Signed)
--   Treated with Tamiflu with clinical resolution.

## 2021-06-14 NOTE — Discharge Summary (Signed)
Physician Discharge Summary   Patient name: [name]  Admit date:     06/07/2021  Discharge date: 06/14/2021  Discharge Physician: Murray Hodgkins   PCP: Gaylan Gerold, DO   Recommendations at discharge:  Consideration could be given to repeat imaging in 4 weeks to ensure resolution, defer to PCP.  Discharge Diagnoses Principal Problem:   Influenza A Active Problems:   Multifocal pneumonia   Asthma exacerbation   Aortic atherosclerosis (HCC)   Dizziness   Hypertension   Iron deficiency anemia   Hypokalemia   Class 1 obesity   Prediabetes   Hyperlipidemia   GERD (gastroesophageal reflux disease)   Atypical chest pain  Hospital Course   55 year old woman presented with nausea, vomiting, shortness of breath and cough. --Further evaluation revealed influenza A and pneumonia, responded well to antibiotics and Tamiflu with clinical resolution.  Brief episodes of arrhythmia initially considered to be NSVT but probably right bundle branch block with aberrancy.  Seen by cardiology with no need for cardiology follow-up.  Hospitalization prolonged by dizziness which has resolved, patient ambulating without difficulty, stable for discharge.    * Influenza A -- Treated with Tamiflu with clinical resolution.  Multifocal pneumonia -- Complicated by acute influenza A, responded well to antibiotics, completed course.  I do not see any hypoxia documented.  Consideration could be given to repeat imaging in 4 weeks to ensure resolution, defer to PCP.  Mild hemoptysis probably secondary to pneumonia, resolved.  Asthma exacerbation -- Resolved with standard therapy.  Dizziness -- Resolved.  Significance unclear.  No abnormal findings noted by PT.  Aortic atherosclerosis ([place]) -- Consider outpatient statin  Atypical chest pain -- Resolved, seen by cardiology, no further evaluation suggested.  Work-up negative.   Procedures performed: none   Condition at discharge: good  Feels  better.  Breathing better.  Ambulating without difficulty.  Dizziness resolved. Exam Physical Exam Constitutional:      General: She is not in acute distress.    Appearance: She is not ill-appearing or toxic-appearing.  Cardiovascular:     Rate and Rhythm: Normal rate and regular rhythm.     Heart sounds: No murmur heard. Pulmonary:     Effort: Pulmonary effort is normal. No respiratory distress.     Breath sounds: No wheezing, rhonchi or rales.  Neurological:     Mental Status: She is alert.  Psychiatric:        Mood and Affect: Mood normal.        Behavior: Behavior normal.    Disposition: Home  Discharge time: greater than 30 minutes.  Follow-up Information     Gaylan Gerold, DO Follow up.   Specialty: Internal Medicine Why: as needed Contact information: [place] [address] [phone]         Pixie Casino, MD .   Specialty: Cardiology Contact information: [address] [place] [address] [phone]                 Allergies as of 06/14/2021       Reactions   Amoxicillin Rash   PATIENT HAS HAD A PCN REACTION WITH IMMEDIATE RASH, FACIAL/TONGUE/THROAT SWELLING, SOB, OR LIGHTHEADEDNESS WITH HYPOTENSION:  #  #  #  YES  #  #  #   HAS PT DEVELOPED SEVERE RASH INVOLVING MUCUS MEMBRANES or SKIN NECROSIS: #  #  #  YES  #  #  #     "On my face" Has patient had a  PCN reaction that required hospitalization: No Has patient had a PCN reaction occurring within the last 10 years:  #  #  #  UNKNOWN  #  #  #  .   Ampicillin Rash   PATIENT HAS HAD A PCN REACTION WITH IMMEDIATE RASH, FACIAL/TONGUE/THROAT SWELLING, SOB, OR LIGHTHEADEDNESS WITH HYPOTENSION: # # # YES # # #  HAS PT DEVELOPED SEVERE RASH INVOLVING MUCUS MEMBRANES or SKIN NECROSIS: # # # YES # # # "On my face"e Has patient had a PCN reaction that required hospitalization: No Has patient had a PCN reaction occurring within the last 10 years:  #  #  #  UNKNOWN  #  #  #     Mobic [meloxicam] Rash   Montelukast Sodium Rash   Phenergan [promethazine Hcl] Nausea And Vomiting        Medication List     TAKE these medications    acetaminophen 500 MG tablet Commonly known as: TYLENOL Take 1 tablet (500 mg total) by mouth every 6 (six) hours as needed. What changed:  how much to take reasons to take this   albuterol 108 (90 Base) MCG/ACT inhaler Commonly known as: VENTOLIN HFA Inhale 2 puffs into the lungs every 6 (six) hours as needed for wheezing or shortness of breath.   albuterol (2.5 MG/3ML) 0.083% nebulizer solution Commonly known as: PROVENTIL Take 3 mLs (2.5 mg total) by nebulization every 6 (six) hours as needed for wheezing or shortness of breath.   amLODipine 10 MG tablet Commonly known as: NORVASC Take 1 tablet (10 mg total) by mouth daily.   budesonide-formoterol 80-4.5 MCG/ACT inhaler Commonly known as: Symbicort INHALE TWO PUFFS BY MOUTH INTO LUNGS TWICE DAILY   cetirizine 10 MG tablet Commonly known as: ZYRTEC Take 10 mg by mouth daily.   diclofenac sodium 1 % Gel Commonly known as: VOLTAREN Apply 4 g topically 4 (four) times daily. What changed:  when to take this reasons to take this   ferrous sulfate 325 (65 FE) MG tablet Take 1 tablet (325 mg total) by mouth daily.   fluticasone 50 MCG/ACT nasal spray Commonly known as: FLONASE USE TWO SPRAY(S) IN EACH NOSTRIL ONCE DAILY What changed: See the new instructions.        DG Chest 2 View  Result Date: 06/13/2021 CLINICAL DATA:  Pneumonia EXAM: CHEST - 2 VIEW COMPARISON:  06/07/2021 FINDINGS: The heart size and mediastinal contours are within normal limits. Persistent minimal patchy density at the lung bases. No pleural effusion. The visualized skeletal structures are unremarkable. IMPRESSION: Persistent minimal patchy density at the lung bases likely reflecting pneumonia seen on chest CT. Electronically Signed   By: Macy Mis M.D.   On: 06/13/2021 15:14   DG  Chest 2 View  Result Date: 06/07/2021 CLINICAL DATA:  Cough EXAM: CHEST - 2 VIEW COMPARISON:  Chest x-ray 02/02/2019 FINDINGS: The heart and mediastinal contours are unchanged. Possible developing trace left lower lobe airspace opacity. No pulmonary edema. No pleural effusion. No pneumothorax. No acute osseous abnormality. Right upper quadrant surgical clips. IMPRESSION: Possible developing trace left lower lobe airspace opacity. Electronically Signed   By: Iven Finn M.D.   On: 06/07/2021 22:22   CT CHEST WO CONTRAST  Result Date: 06/09/2021 CLINICAL DATA:  Hemoptysis EXAM: CT CHEST WITHOUT CONTRAST TECHNIQUE: Multidetector CT imaging of the chest was performed following the standard protocol without IV contrast. COMPARISON:  None. FINDINGS: Cardiovascular: Aortic atherosclerosis. Mild cardiomegaly. Trace pericardial effusion. Mediastinum/Nodes: No enlarged mediastinal,  hilar, or axillary lymph nodes. Small hiatal hernia. Thyroid gland, trachea, and esophagus demonstrate no significant findings. Lungs/Pleura: There is bilateral heterogeneous ground-glass and consolidative airspace opacity, most conspicuously in the lower lobes (series 5, image 95) but also seen in the right upper and right middle lobes. No pleural effusion or pneumothorax. Upper Abdomen: No acute abnormality. Benign, fatty adenomatous thickening of the bilateral adrenal glands. Musculoskeletal: No chest wall mass or suspicious bone lesions identified. IMPRESSION: 1. There is bilateral heterogeneous ground-glass and consolidative airspace opacity, most conspicuously in the lower lobes but also seen in the right upper and right middle lobes. Findings are consistent with multifocal infection and/or aspiration. 2. Small hiatal hernia, which may place the patient at risk for aspiration. Aortic Atherosclerosis (ICD10-I70.0). Electronically Signed   By: Delanna Ahmadi M.D.   On: 06/09/2021 14:34   ECHOCARDIOGRAM COMPLETE  Result Date:  06/11/2021    ECHOCARDIOGRAM REPORT   Patient Name:   KALANY DIEKMANN Date of Exam: 06/11/2021 Medical Rec #:  [id number]      Height:       61.0 in Accession #:    [id number]     Weight:       175.0 lb Date of Birth:  Aug 21, 1965      BSA:          1.785 m Patient Age:    25 years       BP:           141/79 mmHg Patient Gender: F              HR:           67 bpm. Exam Location:  Inpatient Procedure: 2D Echo, Cardiac Doppler, Color Doppler and Intracardiac            Opacification Agent Indications:    NSVT (nonsustained ventricular tachycardia)  History:        Patient has prior history of Echocardiogram examinations, most                 recent 06/07/2017. Risk Factors:Hypertension and Dyslipidemia.                 Influenza A. GERD.  Sonographer:    Darlina Sicilian RDCS Referring Phys: [place]  1. Left ventricular ejection fraction, by estimation, is 65 to 70%. The left ventricle has normal function. The left ventricle has no regional wall motion abnormalities. There is mild concentric left ventricular hypertrophy. Left ventricular diastolic parameters are consistent with Grade I diastolic dysfunction (impaired relaxation).  2. Right ventricular systolic function is normal. The right ventricular size is normal. Tricuspid regurgitation signal is inadequate for assessing PA pressure.  3. The mitral valve is normal in structure. Trivial mitral valve regurgitation.  4. The aortic valve is tricuspid. There is mild calcification of the aortic valve. There is mild thickening of the aortic valve. Aortic valve regurgitation is not visualized.  5. The inferior vena cava is normal in size with greater than 50% respiratory variability, suggesting right atrial pressure of 3 mmHg. Comparison(s): Compared to prior TTE in 2018, there is no significant change. FINDINGS  Left Ventricle: Left ventricular ejection fraction, by estimation, is 65 to 70%. The left ventricle has normal function. The left ventricle  has no regional wall motion abnormalities. Definity contrast agent was given IV to delineate the left ventricular  endocardial borders. The left ventricular internal cavity size was normal in size. There is mild concentric left ventricular hypertrophy.  Left ventricular diastolic parameters are consistent with Grade I diastolic dysfunction (impaired relaxation). Right Ventricle: The right ventricular size is normal. No increase in right ventricular wall thickness. Right ventricular systolic function is normal. Tricuspid regurgitation signal is inadequate for assessing PA pressure. Left Atrium: Left atrial size was normal in size. Right Atrium: Right atrial size was normal in size. Pericardium: There is no evidence of pericardial effusion. Mitral Valve: The mitral valve is normal in structure. Mild mitral annular calcification. Trivial mitral valve regurgitation. Tricuspid Valve: The tricuspid valve is normal in structure. Tricuspid valve regurgitation is trivial. Aortic Valve: The aortic valve is tricuspid. There is mild calcification of the aortic valve. There is mild thickening of the aortic valve. Aortic valve regurgitation is not visualized. Pulmonic Valve: The pulmonic valve was not well visualized. Pulmonic valve regurgitation is trivial. Aorta: The aortic root and ascending aorta are structurally normal, with no evidence of dilitation. Venous: The inferior vena cava is normal in size with greater than 50% respiratory variability, suggesting right atrial pressure of 3 mmHg. IAS/Shunts: No atrial level shunt detected by color flow Doppler.  LEFT VENTRICLE PLAX 2D LVIDd:         4.90 cm      Diastology LVIDs:         2.80 cm      LV e' medial:    4.73 cm/s LV PW:         1.00 cm      LV E/e' medial:  14.1 LV IVS:        0.80 cm      LV e' lateral:   5.63 cm/s LVOT diam:     2.00 cm      LV E/e' lateral: 11.9 LV SV:         68 LV SV Index:   38 LVOT Area:     3.14 cm  LV Volumes (MOD) LV vol d, MOD A2C: 110.0 ml  LV vol d, MOD A4C: 128.0 ml LV vol s, MOD A2C: 27.1 ml LV vol s, MOD A4C: 50.4 ml LV SV MOD A2C:     82.9 ml LV SV MOD A4C:     128.0 ml LV SV MOD BP:      83.5 ml RIGHT VENTRICLE RV S prime:     18.20 cm/s TAPSE (M-mode): 2.6 cm LEFT ATRIUM             Index        RIGHT ATRIUM          Index LA diam:        3.90 cm 2.19 cm/m   RA Area:     9.10 cm LA Vol (A2C):   31.3 ml 17.54 ml/m  RA Volume:   14.00 ml 7.85 ml/m LA Vol (A4C):   27.8 ml 15.58 ml/m LA Biplane Vol: 29.6 ml 16.59 ml/m  AORTIC VALVE LVOT Vmax:   108.00 cm/s LVOT Vmean:  66.700 cm/s LVOT VTI:    0.217 m  AORTA Ao Root diam: 2.80 cm Ao Asc diam:  3.40 cm MITRAL VALVE MV Area (PHT): 3.27 cm    SHUNTS MV Decel Time: 232 msec    Systemic VTI:  0.22 m MV E velocity: 66.80 cm/s  Systemic Diam: 2.00 cm MV A velocity: 72.40 cm/s MV E/A ratio:  0.92 Gwyndolyn Kaufman MD Electronically signed by Gwyndolyn Kaufman MD Signature Date/Time: 06/11/2021/3:59:58 PM    Final    Results for orders placed or performed during the hospital  encounter of 06/07/21  Resp Panel by RT-PCR (Flu A&B, Covid) Nasopharyngeal Swab     Status: Abnormal   Collection Time: 06/07/21  9:54 PM   Specimen: Nasopharyngeal Swab; Nasopharyngeal(NP) swabs in vial transport medium  Result Value Ref Range Status   SARS Coronavirus 2 by RT PCR NEGATIVE NEGATIVE Final    Comment: (NOTE) SARS-CoV-2 target nucleic acids are NOT DETECTED.  The SARS-CoV-2 RNA is generally detectable in upper respiratory specimens during the acute phase of infection. The lowest concentration of SARS-CoV-2 viral copies this assay can detect is 138 copies/mL. A negative result does not preclude SARS-Cov-2 infection and should not be used as the sole basis for treatment or other patient management decisions. A negative result may occur with  improper specimen collection/handling, submission of specimen other than nasopharyngeal swab, presence of viral mutation(s) within the areas targeted by this  assay, and inadequate number of viral copies(<138 copies/mL). A negative result must be combined with clinical observations, patient history, and epidemiological information. The expected result is Negative.  Fact Sheet for Patients:  EntrepreneurPulse.com.au  Fact Sheet for Healthcare Providers:  IncredibleEmployment.be  This test is no t yet approved or cleared by the [place] FDA and  has been authorized for detection and/or diagnosis of SARS-CoV-2 by FDA under an Emergency Use Authorization (EUA). This EUA will remain  in effect (meaning this test can be used) for the duration of the COVID-19 declaration under Section 564(b)(1) of the Act, 21 U.S.C.section 360bbb-3(b)(1), unless the authorization is terminated  or revoked sooner.       Influenza A by PCR POSITIVE (A) NEGATIVE Final   Influenza B by PCR NEGATIVE NEGATIVE Final    Comment: (NOTE) The Xpert Xpress SARS-CoV-2/FLU/RSV plus assay is intended as an aid in the diagnosis of influenza from Nasopharyngeal swab specimens and should not be used as a sole basis for treatment. Nasal washings and aspirates are unacceptable for Xpert Xpress SARS-CoV-2/FLU/RSV testing.  Fact Sheet for Patients: EntrepreneurPulse.com.au  Fact Sheet for Healthcare Providers: IncredibleEmployment.be  This test is not yet approved or cleared by the [place] FDA and has been authorized for detection and/or diagnosis of SARS-CoV-2 by FDA under an Emergency Use Authorization (EUA). This EUA will remain in effect (meaning this test can be used) for the duration of the COVID-19 declaration under Section 564(b)(1) of the Act, 21 U.S.C. section 360bbb-3(b)(1), unless the authorization is terminated or revoked.  Performed at [facility], [place] [address], [place], [place] [address]     Signed:  Murray Hodgkins MD.  [facility] 06/14/2021, 5:53 PM

## 2021-06-14 NOTE — Assessment & Plan Note (Addendum)
--   Complicated by acute influenza A, responded well to antibiotics, completed course.  I do not see any hypoxia documented.  Consideration could be given to repeat imaging in 4 weeks to ensure resolution, defer to PCP.  Mild hemoptysis probably secondary to pneumonia, resolved.

## 2021-06-14 NOTE — Assessment & Plan Note (Signed)
--   Consider outpatient statin

## 2021-06-14 NOTE — Assessment & Plan Note (Signed)
--   Resolved, seen by cardiology, no further evaluation suggested.  Work-up negative.

## 2021-06-14 NOTE — Assessment & Plan Note (Signed)
--   Resolved with standard therapy.

## 2021-06-14 NOTE — Plan of Care (Signed)

## 2021-06-14 NOTE — Hospital Course (Addendum)
55 year old woman presented with nausea, vomiting, shortness of breath and cough. --Further evaluation revealed influenza A and pneumonia, responded well to antibiotics and Tamiflu with clinical resolution.  Brief episodes of arrhythmia initially considered to be NSVT but probably right bundle branch block with aberrancy.  Seen by cardiology with no need for cardiology follow-up.  Hospitalization prolonged by dizziness which has resolved, patient ambulating without difficulty, stable for discharge.

## 2021-06-14 NOTE — Assessment & Plan Note (Signed)
--   Resolved.  Significance unclear.  No abnormal findings noted by PT.

## 2021-06-23 IMAGING — CR CHEST - 2 VIEW
2 series · 2 of 2 positions shown · non-contrast
Comparison: March 14, 2015.

CLINICAL DATA: Chest pain

EXAM:
CHEST - 2 VIEW

[w chest pa]
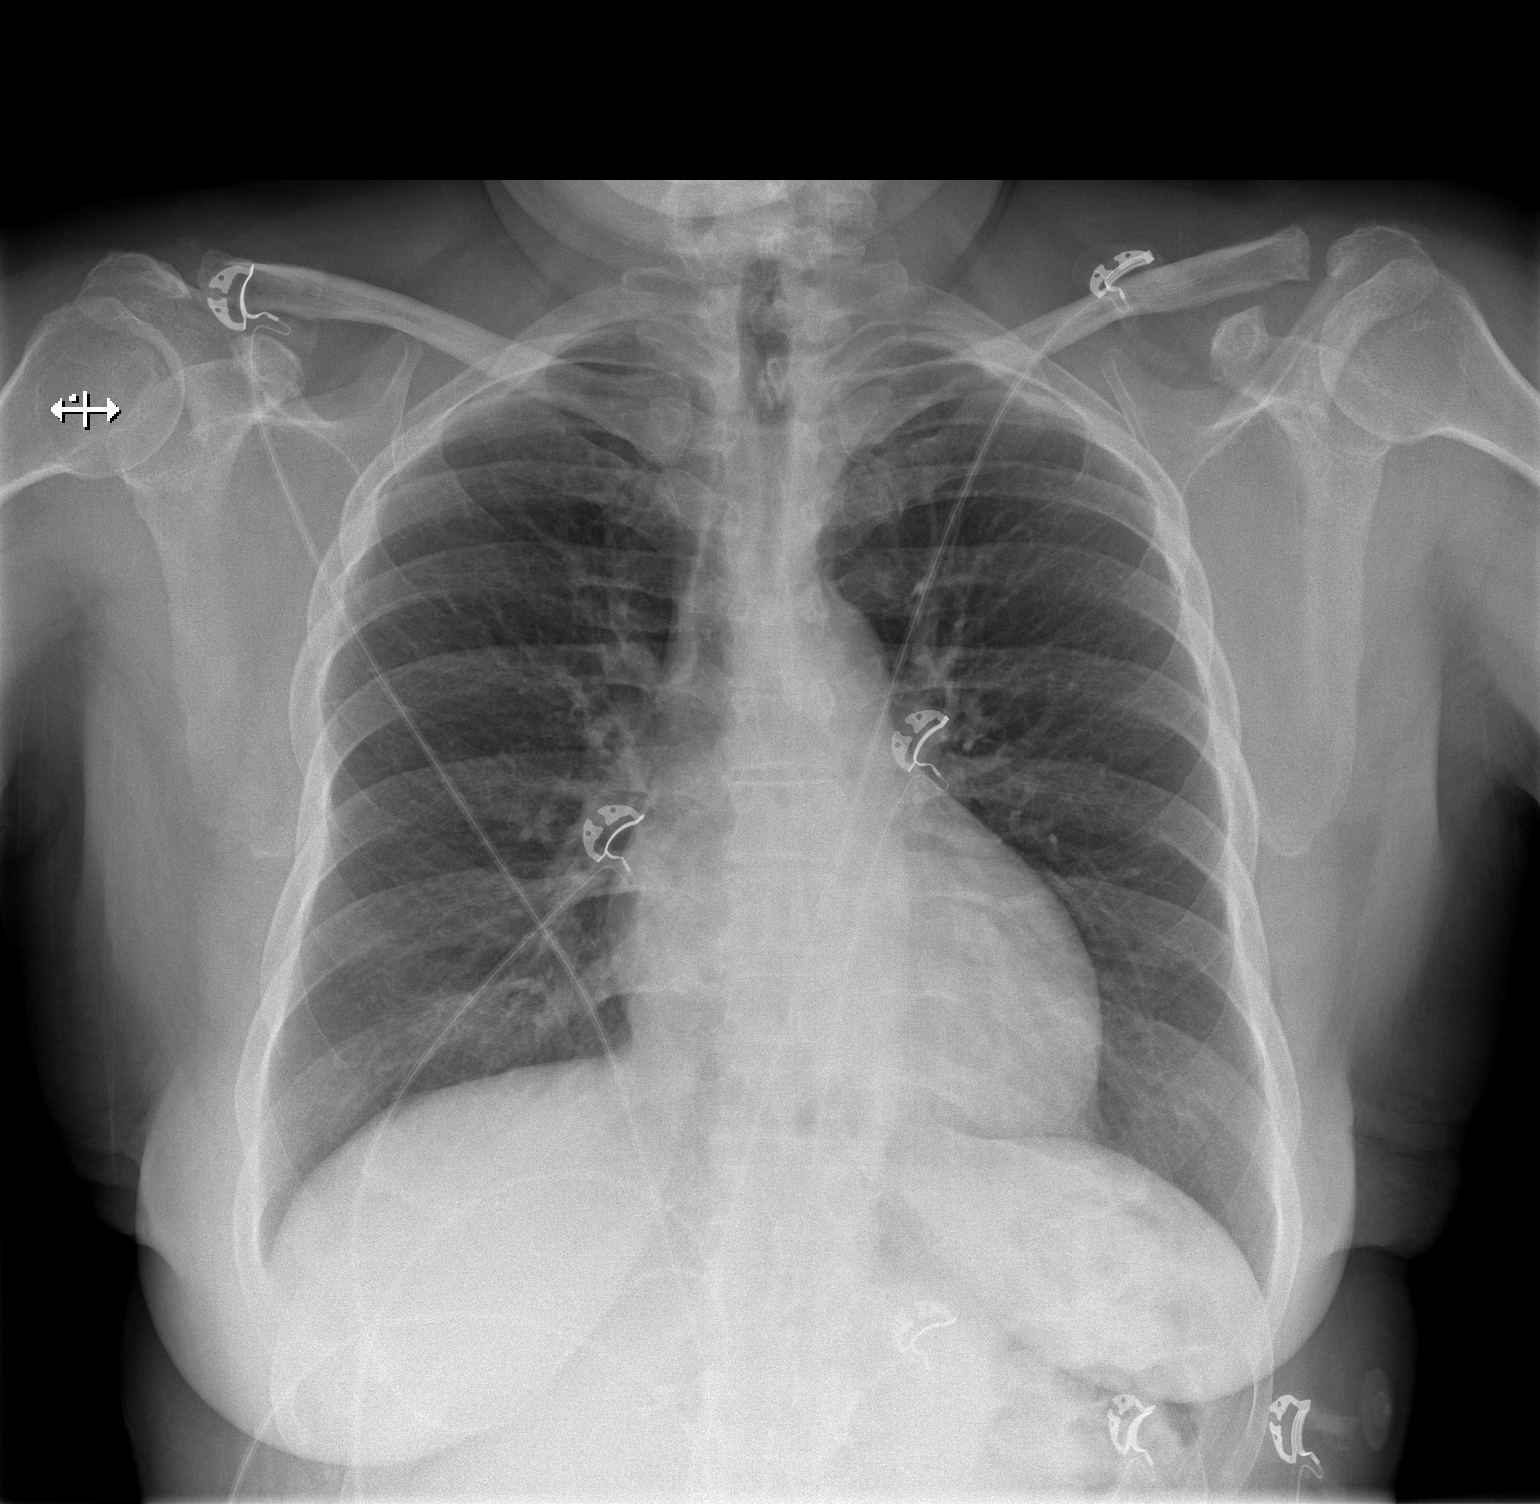

[w chest lat]
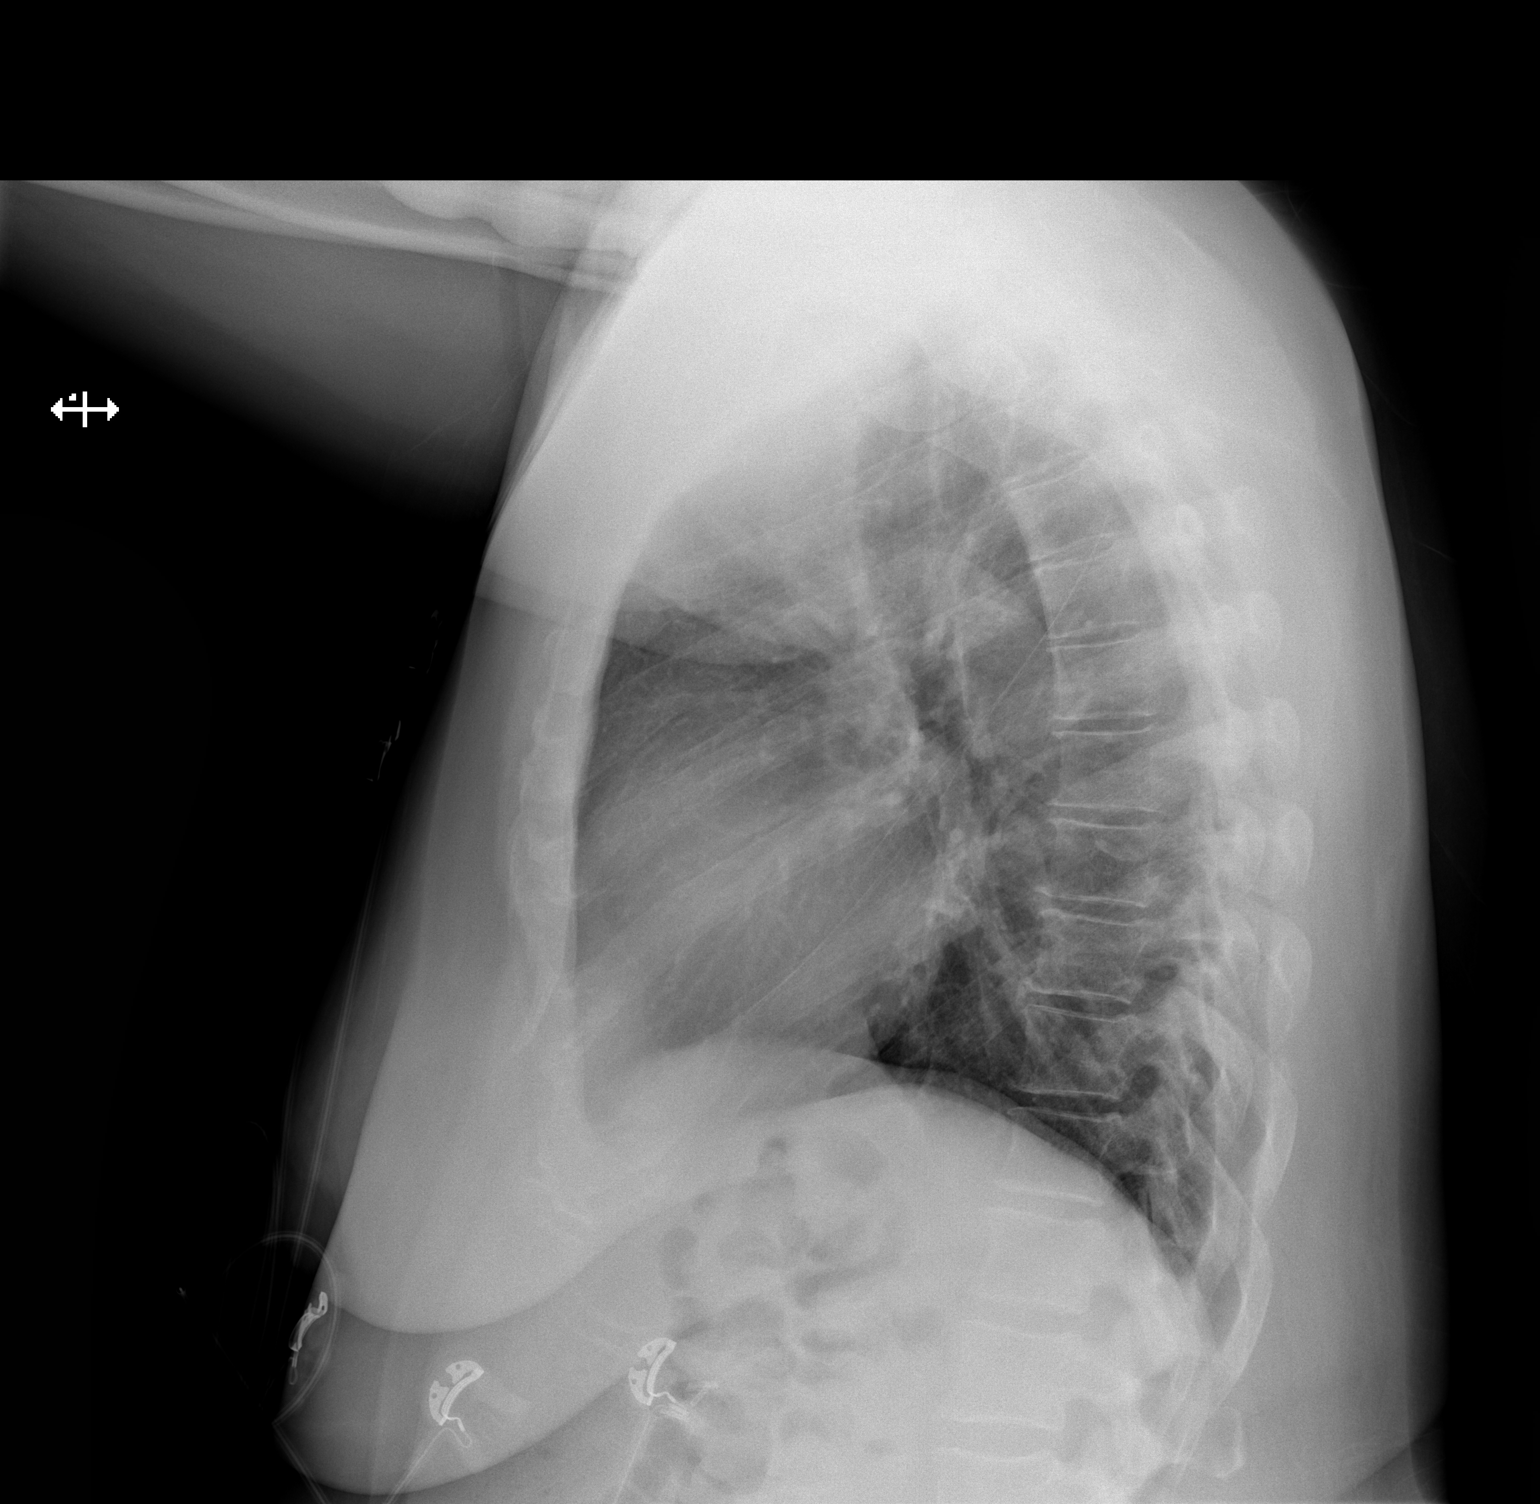

[2 of 2 positions shown; findings below may reference images not displayed]

FINDINGS: The heart size and mediastinal contours are within normal limits.
Both lungs are clear. Degenerative changes seen in the midthoracic
spine. No acute osseous findings.
IMPRESSION: No acute cardiopulmonary disease.

## 2021-08-23 ENCOUNTER — Other Ambulatory Visit: Payer: Self-pay | Admitting: Student

## 2021-09-24 ENCOUNTER — Encounter: Payer: Self-pay | Admitting: Student

## 2021-09-24 ENCOUNTER — Telehealth: Payer: Self-pay

## 2021-09-24 DIAGNOSIS — I10 Essential (primary) hypertension: Secondary | ICD-10-CM

## 2021-09-24 NOTE — Telephone Encounter (Signed)
Pt called requesting her appt  for today with Dr Johnney Ou ..  she then stated she gets out of work at [employer] and we gave her an appt '@3'$ :38 and she lives in  [place]  and she is not getting a ticket to come here for nothing .Marland Kitchen I then offered a tele health being the appt was for a med refill at which point Dr Johnney Ou was in the room I placed her on a hold and asked if I can go ahead and change the appt .Marland Kitchen Dr Johnney Ou  then stated he would have no problem  refilling the medicine but she has to come in at some point when I went back to tell [name].. she stated she does not give a damn what we do she did not request the medicine anyways it was the Pharmacy she has been out of it for a month and dont care .Marland Kitchen at which point I said ok Ann Spike I will cancel your appt have a nice day  ?

## 2021-10-05 MED ORDER — AMLODIPINE BESYLATE 10 MG PO TABS: 10.0000 mg | ORAL_TABLET | Freq: Every day | ORAL | 3 refills | Status: DC

## 2021-10-16 ENCOUNTER — Ambulatory Visit (INDEPENDENT_AMBULATORY_CARE_PROVIDER_SITE_OTHER): Payer: Commercial Managed Care - PPO

## 2021-10-16 ENCOUNTER — Ambulatory Visit: Payer: Commercial Managed Care - PPO | Admitting: Nurse Practitioner

## 2021-10-16 ENCOUNTER — Encounter: Payer: Self-pay | Admitting: Nurse Practitioner

## 2021-10-16 VITALS — BP 138/74 | HR 97 | Temp 97.8°F | Ht 61.0 in | Wt 173.8 lb

## 2021-10-16 DIAGNOSIS — I10 Essential (primary) hypertension: Secondary | ICD-10-CM

## 2021-10-16 DIAGNOSIS — R7303 Prediabetes: Secondary | ICD-10-CM

## 2021-10-16 DIAGNOSIS — D509 Iron deficiency anemia, unspecified: Secondary | ICD-10-CM

## 2021-10-16 DIAGNOSIS — M545 Low back pain, unspecified: Secondary | ICD-10-CM

## 2021-10-16 DIAGNOSIS — G4700 Insomnia, unspecified: Secondary | ICD-10-CM

## 2021-10-16 DIAGNOSIS — I700 Atherosclerosis of aorta: Secondary | ICD-10-CM | POA: Diagnosis not present

## 2021-10-16 DIAGNOSIS — G8929 Other chronic pain: Secondary | ICD-10-CM | POA: Diagnosis not present

## 2021-10-16 DIAGNOSIS — M25511 Pain in right shoulder: Secondary | ICD-10-CM

## 2021-10-16 DIAGNOSIS — F325 Major depressive disorder, single episode, in full remission: Secondary | ICD-10-CM | POA: Diagnosis not present

## 2021-10-16 DIAGNOSIS — Z8719 Personal history of other diseases of the digestive system: Secondary | ICD-10-CM

## 2021-10-16 DIAGNOSIS — E785 Hyperlipidemia, unspecified: Secondary | ICD-10-CM

## 2021-10-16 DIAGNOSIS — M25512 Pain in left shoulder: Secondary | ICD-10-CM

## 2021-10-16 DIAGNOSIS — F322 Major depressive disorder, single episode, severe without psychotic features: Secondary | ICD-10-CM

## 2021-10-16 DIAGNOSIS — J4521 Mild intermittent asthma with (acute) exacerbation: Secondary | ICD-10-CM

## 2021-10-16 MED ORDER — TRAZODONE HCL 50 MG PO TABS
25.0000 mg | ORAL_TABLET | Freq: Every evening | ORAL | 1 refills | Status: DC | PRN
Start: 2021-10-16 — End: 2021-11-09

## 2021-10-16 MED ORDER — GUAIFENESIN 100 MG/5ML PO LIQD: 5.0000 mL | ORAL | 0 refills | Status: DC | PRN

## 2021-10-16 MED ORDER — NAPROXEN 500 MG PO TABS: 500.0000 mg | ORAL_TABLET | Freq: Two times a day (BID) | ORAL | 0 refills | Status: DC

## 2021-10-16 NOTE — Patient Instructions (Addendum)
It was great to see you! ? ?Start guaifenesin every 4 hours as needed to help break up your congestion. Re-start your flonase daily as this can help with allergies as well.  ? ?Start naproxen twice a day with food for your back and shoulder pain. I have attached stretches to do daily for both your back and shoulder.    ? ?Start trazodone 1/2 tablet at bedtime to help you sleep.  ? ?Let's follow-up in 4-6 weeks, sooner if you have concerns. ? ?If a referral was placed today, you will be contacted for an appointment. Please note that routine referrals can sometimes take up to 3-4 weeks to process. Please call our office if you haven't heard anything after this time frame. ? ?Take care, ? ?Vance Peper, NP ? ?

## 2021-10-16 NOTE — Progress Notes (Signed)
? ?New Patient Office Visit ? ?Subjective:  ?Patient ID: [name], female    DOB: 1966/03/28  Age: 56 y.o. MRN: [id number] ? ?CC:  ?Chief Complaint  ?Patient presents with  ? Establish Care  ?  NP/establish care pain in back and shoulders come and go. Discuss BP and asthma  ? ? ?HPI ?[name] presents for new patient visit to establish care.  Introduced to Designer, [employer]ellery role and practice setting.  All questions answered.  Discussed provider/patient relationship and expectations. ? ?She works in a nursing home. She has a history of right hip replacement. For the past 6 months, she has been experiencing low back pain. The pain does not radiate and it come and goes. She is not sure if it hurts from turning and moving patients from her job or if she is walking abnormally with her hip replacement. The pain is worse with movement. She wears a back brace daily. She takes ibuprofen and uses a muscle rub cream.   ? ?She also has bilateral shoulder pain. It started on her right shoulder pain about a month ago. She describes it as a sharp pain and rates it 8-9/10. Pain is worse with movement. Denies radiation of pain. She has been doing exercises at the gym which have helped some of the pain. She also takes ibuprofen and uses a muscle rub cream.  ? ?She has a history of asthma which has been slightly worse with recent change in season and weather. She has not been taking symbicort due to cost. She uses albuterol inhaler/nebulizer as needed. She states that she has been having more congestion and feels like it is hard to break up the congestion in her chest to cough it out. She denies shortness of breath and wheezing today.  ? ?She has a history of diverticulitis. She avoids triggers that makes her symptoms worse. Overall, this is stable and she denies any abdominal pain, constipation or diarrhea today. ? ?She has experienced trouble sleeping for the past several months. She tried melatonin and z-quil. She  states she has trouble staying asleep, she wakes up frequently. She denies worsening of anxiety and stress.  ? ? ?  10/16/2021  ?  4:18 PM 10/16/2021  ?  3:27 PM 08/20/2020  ?  2:41 PM 01/28/2020  ?  3:22 PM 12/17/2019  ?  3:48 PM  ?Depression screen PHQ 2/9  ?Decreased Interest 0 0 0 0 0  ?Down, Depressed, Hopeless 0 0 0 0 0  ?PHQ - 2 Score 0 0 0 0 0  ?Altered sleeping '2  1 2 2  '$ ?Tired, decreased energy 0  '1 2 2  '$ ?Change in appetite '1  2 1 1  '$ ?Feeling bad or failure about yourself  0  0 0 0  ?Trouble concentrating '1  1 1 1  '$ ?Moving slowly or fidgety/restless 0  0 1 1  ?Suicidal thoughts 0  0 0 0  ?PHQ-9 Score '4  5 7 7  '$ ?Difficult doing work/chores Not difficult at all  Not difficult at all Not difficult at all   ? ? ?  10/16/2021  ?  4:18 PM  ?GAD 7 : Generalized Anxiety Score  ?Nervous, Anxious, on Edge 0  ?Control/stop worrying 0  ?Worry too much - different things 1  ?Trouble relaxing 1  ?Restless 0  ?Easily annoyed or irritable 1  ?Afraid - awful might happen 0  ?Total GAD 7 Score 3  ?Anxiety Difficulty Not difficult at all  ? ? ?  Past Medical History:  ?Diagnosis Date  ? Anemia   ? Asthma   ? Bursitis of left hip   ? Decreased appetite   ? GERD (gastroesophageal reflux disease)   ? History of hiatal hernia   ? repaired  ? History of stomach ulcers   ? Hypertension   ? Migraine   ? "maybe twice in the last 10 yrs" (10/16/2013)  ? PONV (postoperative nausea and vomiting)   ? Shortness of breath dyspnea   ? ? ?Past Surgical History:  ?Procedure Laterality Date  ? BARTHOLIN GLAND CYST EXCISION    ? BREAST LUMPECTOMY WITH RADIOACTIVE SEED LOCALIZATION Left 01/23/2016  ? Procedure: BREAST LUMPECTOMY WITH RADIOACTIVE SEED LOCALIZATION;  Surgeon: Jackolyn Confer, MD;  Location: [facility];  Service: General;  Laterality: Left;  ? [place]  ? CHOLECYSTECTOMY  ~ 2000  ? EPIGASTRIC HERNIA REPAIR  07/2003  ? HERNIA REPAIR    ? INCISION AND DRAINAGE ABSCESS  10/2005  ? Bartholin abscess.  ? JOINT REPLACEMENT    ? Right  ?  TONSILLECTOMY AND ADENOIDECTOMY  1983  ? TOTAL HIP ARTHROPLASTY Right 06/05/2017  ? TOTAL HIP ARTHROPLASTY Right 06/06/2017  ? Procedure: RIGHT TOTAL HIP ARTHROPLASTY ANTERIOR APPROACH;  Surgeon: Rod Can, MD;  Location: [facility];  Service: Orthopedics;  Laterality: Right;  Needs RNFA  ? TUBAL LIGATION  1994  ? ? ?Family History  ?Problem Relation Age of Onset  ? Cancer Mother 28  ?     unknown  ? Heart attack Father 22  ? Heart attack Sister 2  ? Diabetes Sister   ? ? ?Social History  ? ?Socioeconomic History  ? Marital status: Single  ?  Spouse name: Not on file  ? Number of children: Not on file  ? Years of education: Not on file  ? Highest education level: Not on file  ?Occupational History  ? Occupation: residential worker  ?Tobacco Use  ? Smoking status: Former  ?  Years: 1.00  ?  Types: Cigarettes  ?  Quit date: 06/01/2014  ?  Years since quitting: 7.3  ? Smokeless tobacco: Never  ?Vaping Use  ? Vaping Use: Never used  ?Substance and Sexual Activity  ? Alcohol use: Not Currently  ?  Alcohol/week: 0.0 standard drinks  ? Drug use: No  ? Sexual activity: Yes  ?Other Topics Concern  ? Not on file  ?Social History Narrative  ? Not on file  ? ?Social Determinants of Health  ? ?Financial Resource Strain: Not on file  ?Food Insecurity: Not on file  ?Transportation Needs: Not on file  ?Physical Activity: Not on file  ?Stress: Not on file  ?Social Connections: Not on file  ?Intimate Partner Violence: Not on file  ? ? ?ROS ?Review of Systems  ?Constitutional:  Positive for fatigue.  ?HENT: Negative.    ?Respiratory: Negative.    ?Cardiovascular: Negative.   ?Gastrointestinal: Negative.   ?Endocrine: Positive for polyuria. Negative for polydipsia and polyphagia.  ?Genitourinary:  Positive for frequency. Negative for dysuria.  ?Musculoskeletal:  Positive for arthralgias and back pain.  ?Skin: Negative.   ?Neurological: Negative.   ?Psychiatric/Behavioral: Negative.    ? ?Objective:  ? ?Today's Vitals: BP 138/74 (BP  Location: Left Arm, Patient Position: Sitting, Cuff Size: Large)   Pulse 97   Temp 97.8 ?F (36.6 ?C) (Temporal)   Ht '5\' 1"'$  (1.549 m)   Wt 173 lb 12.8 oz (78.8 kg)   SpO2 99%   BMI 32.84 kg/m?  ? ?  Physical Exam ?Vitals and nursing note reviewed.  ?Constitutional:   ?   General: She is not in acute distress. ?   Appearance: Normal appearance.  ?HENT:  ?   Head: Normocephalic.  ?Eyes:  ?   Conjunctiva/sclera: Conjunctivae normal.  ?Cardiovascular:  ?   Rate and Rhythm: Normal rate and regular rhythm.  ?   Pulses: Normal pulses.  ?   Heart sounds: Normal heart sounds.  ?Pulmonary:  ?   Effort: Pulmonary effort is normal.  ?   Breath sounds: Normal breath sounds.  ?Abdominal:  ?   Palpations: Abdomen is soft.  ?   Tenderness: There is no abdominal tenderness.  ?Musculoskeletal:     ?   General: Tenderness present. No swelling. Normal range of motion.  ?   Cervical back: Normal range of motion. No tenderness.  ?   Comments: Normal ROM to bilateral shoulders and lumbar spine. Slight tenderness when palpating lumbar spine, no tenderness to paraspinal region. No laxity to shoulders pain with palpation.  ?Lymphadenopathy:  ?   Cervical: No cervical adenopathy.  ?Skin: ?   General: Skin is warm.  ?Neurological:  ?   General: No focal deficit present.  ?   Mental Status: She is alert and oriented to person, place, and time.  ?Psychiatric:     ?   Mood and Affect: Mood normal.     ?   Behavior: Behavior normal.     ?   Thought Content: Thought content normal.     ?   Judgment: Judgment normal.  ? ? ?Assessment & Plan:  ? ?Problem List Items Addressed This Visit   ? ?  ? Cardiovascular and Mediastinum  ? Hypertension  ?  Chronic, stale. Continue amlodipine '10mg'$  daily. Check CMP, CBC. Follow up in 6 months.  ? ?  ?  ? Relevant Orders  ? CBC with Differential/Platelet (Completed)  ? Comprehensive metabolic panel (Completed)  ? Aortic atherosclerosis ([place])  ?  Noted on CT scan of chest on 06/09/21. Will check lipid panel today  and treat based on results.  ? ?  ?  ? Relevant Orders  ? Lipid panel (Completed)  ?  ? Respiratory  ? Asthma, not well controlled, mild intermittent, with acute exacerbation  ?  Symptoms have worsened in the la

## 2021-10-17 ENCOUNTER — Encounter: Payer: Self-pay | Admitting: Nurse Practitioner

## 2021-10-17 DIAGNOSIS — M25511 Pain in right shoulder: Secondary | ICD-10-CM | POA: Insufficient documentation

## 2021-10-17 DIAGNOSIS — J4540 Moderate persistent asthma, uncomplicated: Secondary | ICD-10-CM | POA: Insufficient documentation

## 2021-10-17 DIAGNOSIS — G4700 Insomnia, unspecified: Secondary | ICD-10-CM | POA: Insufficient documentation

## 2021-10-17 LAB — COMPREHENSIVE METABOLIC PANEL
AG Ratio: 1.4 (calc) (ref 1.0–2.5)
ALT: 11 U/L (ref 6–29)
AST: 13 U/L (ref 10–35)
Albumin: 4.2 g/dL (ref 3.6–5.1)
Alkaline phosphatase (APISO): 67 U/L (ref 37–153)
BUN: 14 mg/dL (ref 7–25)
CO2: 25 mmol/L (ref 20–32)
Calcium: 9.9 mg/dL (ref 8.6–10.4)
Chloride: 103 mmol/L (ref 98–110)
Creat: 0.7 mg/dL (ref 0.50–1.03)
Globulin: 3.1 g/dL (calc) (ref 1.9–3.7)
Glucose, Bld: 171 mg/dL — ABNORMAL HIGH (ref 65–99)
Potassium: 3.3 mmol/L — ABNORMAL LOW (ref 3.5–5.3)
Sodium: 142 mmol/L (ref 135–146)
Total Bilirubin: 0.2 mg/dL (ref 0.2–1.2)
Total Protein: 7.3 g/dL (ref 6.1–8.1)

## 2021-10-17 LAB — LIPID PANEL
Cholesterol: 187 mg/dL (ref <200)
HDL: 47 mg/dL — ABNORMAL LOW (ref >=50)
LDL Cholesterol (Calc): 113 mg/dL (calc) — ABNORMAL HIGH
Non-HDL Cholesterol (Calc): 140 mg/dL (calc) — ABNORMAL HIGH (ref <130)
Total CHOL/HDL Ratio: 4 (calc) (ref <5.0)
Triglycerides: 159 mg/dL — ABNORMAL HIGH (ref <150)

## 2021-10-17 LAB — CBC WITH DIFFERENTIAL/PLATELET
Absolute Monocytes: 342 cells/uL (ref 200–950)
Basophils Absolute: 52 cells/uL (ref 0–200)
Basophils Relative: 0.9 %
Eosinophils Absolute: 232 cells/uL (ref 15–500)
Eosinophils Relative: 4 %
HCT: 34 % — ABNORMAL LOW (ref 35.0–45.0)
Hemoglobin: 10.8 g/dL — ABNORMAL LOW (ref 11.7–15.5)
Lymphs Abs: 2697 cells/uL (ref 850–3900)
MCH: 25.7 pg — ABNORMAL LOW (ref 27.0–33.0)
MCHC: 31.8 g/dL — ABNORMAL LOW (ref 32.0–36.0)
MCV: 81 fL (ref 80.0–100.0)
MPV: 11.3 fL (ref 7.5–12.5)
Monocytes Relative: 5.9 %
Neutro Abs: 2477 cells/uL (ref 1500–7800)
Neutrophils Relative %: 42.7 %
Platelets: 288 10*3/uL (ref 140–400)
RBC: 4.2 10*6/uL (ref 3.80–5.10)
RDW: 14.5 % (ref 11.0–15.0)
Total Lymphocyte: 46.5 %
WBC: 5.8 10*3/uL (ref 3.8–10.8)

## 2021-10-17 LAB — FERRITIN: Ferritin: 10 ng/mL — ABNORMAL LOW (ref 16–232)

## 2021-10-17 LAB — HEMOGLOBIN A1C
Hgb A1c MFr Bld: 6.6 % of total Hgb — ABNORMAL HIGH (ref <5.7)
Mean Plasma Glucose: 143 mg/dL
eAG (mmol/L): 7.9 mmol/L

## 2021-10-17 NOTE — Assessment & Plan Note (Signed)
Chronic, stable. Continue avoiding foods that trigger symptoms. Follow-up with any concerns.  ?

## 2021-10-17 NOTE — Assessment & Plan Note (Signed)
Noted on CT scan of chest on 06/09/21. Will check lipid panel today and treat based on results.  ?

## 2021-10-17 NOTE — Assessment & Plan Note (Signed)
Acute pain in both shoulders for 1 month. She states the pain is intermittent but seems to be getting better. No signs of tenderness, decreased ROM, weakness, or impingement on exam. Start naproxen as mentioned above for back pain. Follow-up in 4 weeks.  ?

## 2021-10-17 NOTE — Assessment & Plan Note (Addendum)
History of prediabetes, last A1C was 6.0%. Will check A1C and CMP today.  ?

## 2021-10-17 NOTE — Assessment & Plan Note (Signed)
She has a history of depression, however her symptoms are well controlled now without medication. PHQ 9 is a 4 and GAD 7 is a 3. Follow up with any concerns or worsening symptoms.  ?

## 2021-10-17 NOTE — Assessment & Plan Note (Signed)
Chronic, stale. Continue amlodipine '10mg'$  daily. Check CMP, CBC. Follow up in 6 months.  ?

## 2021-10-17 NOTE — Assessment & Plan Note (Signed)
Symptoms have worsened in the last month with the change in season. She feels like she has some chest congestion that is not breaking up. Will have her start robitussin every 4 hours as needed to help with her congestion. Encouraged her to use her albuterol inhaler/nebulizer as needed. She has not been taking her symbicort due to cost. If she is still having ongoing symptoms next visit, will change this to a different maintenance inhaler.  ?

## 2021-10-17 NOTE — Assessment & Plan Note (Signed)
History of elevated cholesterol, will check lipid panel and treat based on results.  ?

## 2021-10-17 NOTE — Assessment & Plan Note (Signed)
She has been having trouble staying asleep for the past few months. She has tried melatonin and z-quil which didn't help. Will start trazodone 25-'50mg'$  as needed for sleep at bedtime. Information given on sleep hygiene. Follow up in 4-6 weeks.  ?

## 2021-10-17 NOTE — Assessment & Plan Note (Signed)
History of iron deficiency anemia.  She is taking an iron supplement daily. She still gets her menstrual cycles, however her last one was in December. Will check CBC and ferritin today and being mindful that she is in peri-menopause.  ?

## 2021-10-17 NOTE — Assessment & Plan Note (Signed)
Chronic back pain that has been intermittent for the last 6 months. No red flags on exam. She has not had recent imaging of her back, will check x-ray of lumbar spine today. She has an allergy to meloxicam, that caused a rash, however she is able to take ibuprofen. Will start naproxen '500mg'$  BID with food. Stop if she develops a rash. Lumbar spine exercises given to patient to complete daily. If she is still having pain in 4 weeks, will order PT. Follow-up in 4-6 weeks.  ?

## 2021-11-07 ENCOUNTER — Other Ambulatory Visit: Payer: Self-pay | Admitting: Nurse Practitioner

## 2021-11-09 NOTE — Telephone Encounter (Signed)
Chart supports rx refill ?Last ov 10/16/21  ?Last refill: 10/16/21 ?

## 2021-11-12 ENCOUNTER — Other Ambulatory Visit: Payer: Self-pay | Admitting: Nurse Practitioner

## 2021-11-16 NOTE — Progress Notes (Signed)
? ?Established Patient Office Visit ? ?Subjective   ?Patient ID: [name], female    DOB: 01-22-1966  Age: 56 y.o. MRN: [id number] ? ?Chief Complaint  ?Patient presents with  ? Follow-up  ?  4 wk f/u back/shoulder pain.   ? ? ?HPI ? ?[name] is here today to follow-up on low back pain, shoulder pain, and insomnia. On her labs, her A1C was noted to be 6.6%. ? ?Since last visit, she has decreased her intake of sweets and stopped putting sugar in her coffee.  She denies chest pain, shortness of breath, urinary frequency.  She has also been drinking some green tea. ? ?She states that the pain improved in her back since starting the naproxen twice a day. She states that she is still having pain in both shoulders, especially when she raises her hands above her head.  She states that she feels like she can lift her arms to about 90 degrees and then it gets harder and more painful.  Sometimes she needs help taking her shirt off.  She rates the pain at a 6/10 today. ? ?The trazodone has helped with her sleep. She states that some days are better than others. She is not having any side effects from the medication.  She does state that she is fatigued during the day and snores at night that she sometimes wakes herself up from this.  She has never had a sleep study done. ? ?Past Medical History:  ?Diagnosis Date  ? Anemia   ? Asthma   ? Bursitis of left hip   ? Decreased appetite   ? GERD (gastroesophageal reflux disease)   ? History of hiatal hernia   ? repaired  ? History of stomach ulcers   ? Hypertension   ? Migraine   ? "maybe twice in the last 10 yrs" (10/16/2013)  ? PONV (postoperative nausea and vomiting)   ? Shortness of breath dyspnea   ? ?Past Surgical History:  ?Procedure Laterality Date  ? BARTHOLIN GLAND CYST EXCISION    ? BREAST LUMPECTOMY WITH RADIOACTIVE SEED LOCALIZATION Left 01/23/2016  ? Procedure: BREAST LUMPECTOMY WITH RADIOACTIVE SEED LOCALIZATION;  Surgeon: Jackolyn Confer, MD;  Location: [facility];   Service: General;  Laterality: Left;  ? [place]  ? CHOLECYSTECTOMY  ~ 2000  ? EPIGASTRIC HERNIA REPAIR  07/2003  ? HERNIA REPAIR    ? INCISION AND DRAINAGE ABSCESS  10/2005  ? Bartholin abscess.  ? JOINT REPLACEMENT    ? Right  ? TONSILLECTOMY AND ADENOIDECTOMY  1983  ? TOTAL HIP ARTHROPLASTY Right 06/05/2017  ? TOTAL HIP ARTHROPLASTY Right 06/06/2017  ? Procedure: RIGHT TOTAL HIP ARTHROPLASTY ANTERIOR APPROACH;  Surgeon: Rod Can, MD;  Location: [facility];  Service: Orthopedics;  Laterality: Right;  Needs RNFA  ? TUBAL LIGATION  1994  ? ?ROS ?See pertinent positives and negatives per HPI. ? ?  ?Objective:  ?  ? ?BP 132/73   Pulse 79   Wt 175 lb 6.4 oz (79.6 kg)   SpO2 100%   BMI 33.14 kg/m?  ?BP Readings from Last 3 Encounters:  ?11/18/21 132/73  ?10/16/21 138/74  ?06/14/21 132/81  ? ?  ? ?Physical Exam ?Vitals and nursing note reviewed.  ?Constitutional:   ?   General: She is not in acute distress. ?   Appearance: Normal appearance.  ?HENT:  ?   Head: Normocephalic.  ?Eyes:  ?   Conjunctiva/sclera: Conjunctivae normal.  ?Cardiovascular:  ?   Rate and  Rhythm: Normal rate and regular rhythm.  ?   Pulses: Normal pulses.  ?   Heart sounds: Normal heart sounds.  ?Pulmonary:  ?   Effort: Pulmonary effort is normal.  ?   Breath sounds: Normal breath sounds.  ?Musculoskeletal:     ?   General: Tenderness present.  ?   Cervical back: Normal range of motion.  ?   Comments: Tenderness to palpation along shoulder joint.  Limited abduction range of motion to around 90 degrees.  ?Skin: ?   General: Skin is warm.  ?Neurological:  ?   General: No focal deficit present.  ?   Mental Status: She is alert and oriented to person, place, and time.  ?Psychiatric:     ?   Mood and Affect: Mood normal.     ?   Behavior: Behavior normal.     ?   Thought Content: Thought content normal.     ?   Judgment: Judgment normal.  ? ? ?No results found for any visits on 11/18/21. ? ?Last CBC ?Lab Results  ?Component Value Date   ? WBC 5.8 10/16/2021  ? HGB 10.8 (L) 10/16/2021  ? HCT 34.0 (L) 10/16/2021  ? MCV 81.0 10/16/2021  ? MCH 25.7 (L) 10/16/2021  ? RDW 14.5 10/16/2021  ? PLT 288 10/16/2021  ? ?Last metabolic panel ?Lab Results  ?Component Value Date  ? GLUCOSE 171 (H) 10/16/2021  ? NA 142 10/16/2021  ? K 3.3 (L) 10/16/2021  ? CL 103 10/16/2021  ? CO2 25 10/16/2021  ? BUN 14 10/16/2021  ? CREATININE 0.70 10/16/2021  ? GFRNONAA >60 06/13/2021  ? CALCIUM 9.9 10/16/2021  ? PHOS 3.7 08/04/2014  ? PROT 7.3 10/16/2021  ? ALBUMIN 4.1 06/08/2021  ? BILITOT 0.2 10/16/2021  ? ALKPHOS 50 06/08/2021  ? AST 13 10/16/2021  ? ALT 11 10/16/2021  ? ANIONGAP 5 06/13/2021  ? ?Last lipids ?Lab Results  ?Component Value Date  ? CHOL 187 10/16/2021  ? HDL 47 (L) 10/16/2021  ? LDLCALC 113 (H) 10/16/2021  ? TRIG 159 (H) 10/16/2021  ? CHOLHDL 4.0 10/16/2021  ? ?Last hemoglobin A1c ?Lab Results  ?Component Value Date  ? HGBA1C 6.6 (H) 10/16/2021  ? ?  ? ?The 10-year ASCVD risk score (Arnett DK, et al., 2019) is: 5.7% ? ?  ?Assessment & Plan:  ? ?Problem List Items Addressed This Visit   ? ?  ? Other  ? Prediabetes  ?  Last A1c was 6.6% which is technically in the diabetic range, however need a second lab to verify this.  She has been adjusting her diet and limiting sweets.  Encouraged her to continue with this.  We will check A1c in 2 months. ? ?  ?  ? Chronic right-sided low back pain without sciatica  ?  Chronic, stable.  Pain has improved significantly since starting naproxen 500 mg twice daily.  We will continue this medication.  Follow-up if symptoms worsen or with any concerns. ? ?  ?  ? Acute pain of both shoulders  ?  She is still having pain in both shoulders, which has been going on for 2 months now.  She states that the pain is around a 6/10.  She states that she has trouble lifting her arms up above her shoulders and sometimes has trouble getting pressure off or on.  With ongoing symptoms, will place referral to orthopedics. ? ?  ?  ? Relevant  Orders  ? Ambulatory referral to Orthopedic  Surgery  ? Insomnia - Primary  ?  Chronic, stable.  She states that she takes 25 mg of the trazodone as needed which has helped with her sleep.  We will continue this medication. ? ?  ?  ? Snoring  ?  He states that she snores at night and sometimes will wake herself up.  She has not been told that she stops breathing while she sleeps.  She does endorse fatigue during the day.  We will place referral for sleep study. ? ?  ?  ? Relevant Orders  ? Ambulatory referral to Sleep Studies  ? ? ?Return in about 2 months (around 01/18/2022) for blood sugar.  ? ? ?Charyl Dancer, NP ? ?

## 2021-11-18 ENCOUNTER — Ambulatory Visit: Payer: Commercial Managed Care - PPO | Admitting: Nurse Practitioner

## 2021-11-18 ENCOUNTER — Encounter: Payer: Self-pay | Admitting: Nurse Practitioner

## 2021-11-18 VITALS — BP 132/73 | HR 79 | Wt 175.4 lb

## 2021-11-18 DIAGNOSIS — M25511 Pain in right shoulder: Secondary | ICD-10-CM | POA: Diagnosis not present

## 2021-11-18 DIAGNOSIS — G4700 Insomnia, unspecified: Secondary | ICD-10-CM

## 2021-11-18 DIAGNOSIS — G8929 Other chronic pain: Secondary | ICD-10-CM

## 2021-11-18 DIAGNOSIS — M545 Low back pain, unspecified: Secondary | ICD-10-CM | POA: Diagnosis not present

## 2021-11-18 DIAGNOSIS — M25512 Pain in left shoulder: Secondary | ICD-10-CM

## 2021-11-18 DIAGNOSIS — R7303 Prediabetes: Secondary | ICD-10-CM | POA: Diagnosis not present

## 2021-11-18 DIAGNOSIS — R0683 Snoring: Secondary | ICD-10-CM

## 2021-11-18 NOTE — Assessment & Plan Note (Signed)
Chronic, stable.  Pain has improved significantly since starting naproxen 500 mg twice daily.  We will continue this medication.  Follow-up if symptoms worsen or with any concerns. ?

## 2021-11-18 NOTE — Assessment & Plan Note (Signed)
She is still having pain in both shoulders, which has been going on for 2 months now.  She states that the pain is around a 6/10.  She states that she has trouble lifting her arms up above her shoulders and sometimes has trouble getting pressure off or on.  With ongoing symptoms, will place referral to orthopedics. ?

## 2021-11-18 NOTE — Assessment & Plan Note (Signed)
He states that she snores at night and sometimes will wake herself up.  She has not been told that she stops breathing while she sleeps.  She does endorse fatigue during the day.  We will place referral for sleep study. ?

## 2021-11-18 NOTE — Patient Instructions (Signed)
It was great to see you! ? ?Continue watching your sugar intake like you are doing.  ? ?I am placing a referral for orthopedics and for a sleep study.  ? ?Let's follow-up in 2 months, sooner if you have concerns. ? ?If a referral was placed today, you will be contacted for an appointment. Please note that routine referrals can sometimes take up to 3-4 weeks to process. Please call our office if you haven't heard anything after this time frame. ? ?Take care, ? ?Vance Peper, NP ? ?

## 2021-11-18 NOTE — Assessment & Plan Note (Signed)
Last A1c was 6.6% which is technically in the diabetic range, however need a second lab to verify this.  She has been adjusting her diet and limiting sweets.  Encouraged her to continue with this.  We will check A1c in 2 months. ?

## 2021-11-18 NOTE — Assessment & Plan Note (Signed)
Chronic, stable.  She states that she takes 25 mg of the trazodone as needed which has helped with her sleep.  We will continue this medication. ?

## 2021-12-09 ENCOUNTER — Ambulatory Visit (INDEPENDENT_AMBULATORY_CARE_PROVIDER_SITE_OTHER): Payer: Commercial Managed Care - PPO

## 2021-12-09 ENCOUNTER — Encounter: Payer: Self-pay | Admitting: Orthopaedic Surgery

## 2021-12-09 ENCOUNTER — Ambulatory Visit: Payer: Commercial Managed Care - PPO | Admitting: Orthopaedic Surgery

## 2021-12-09 ENCOUNTER — Ambulatory Visit: Payer: Commercial Managed Care - PPO

## 2021-12-09 DIAGNOSIS — G8929 Other chronic pain: Secondary | ICD-10-CM

## 2021-12-09 DIAGNOSIS — M25511 Pain in right shoulder: Secondary | ICD-10-CM

## 2021-12-09 DIAGNOSIS — M25512 Pain in left shoulder: Secondary | ICD-10-CM

## 2021-12-09 MED ORDER — METHYLPREDNISOLONE ACETATE 40 MG/ML IJ SUSP
40.0000 mg | INTRAMUSCULAR | Status: AC | PRN
  Administered 2021-12-09: 40 mg via INTRA_ARTICULAR

## 2021-12-09 MED ORDER — LIDOCAINE HCL 1 % IJ SOLN
3.0000 mL | INTRAMUSCULAR | Status: AC | PRN
  Administered 2021-12-09: 3 mL
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
## 2021-12-09 NOTE — Addendum Note (Signed)
Addended by: Robyne Peers on: 12/09/2021 04:53 PM   Modules accepted: Orders

## 2021-12-14 ENCOUNTER — Other Ambulatory Visit: Payer: Self-pay | Admitting: Nurse Practitioner

## 2022-01-13 ENCOUNTER — Ambulatory Visit (INDEPENDENT_AMBULATORY_CARE_PROVIDER_SITE_OTHER): Payer: Commercial Managed Care - PPO | Admitting: Physical Medicine and Rehabilitation

## 2022-01-13 ENCOUNTER — Encounter: Payer: Self-pay | Admitting: Physical Medicine and Rehabilitation

## 2022-01-13 DIAGNOSIS — R202 Paresthesia of skin: Secondary | ICD-10-CM

## 2022-01-13 NOTE — Progress Notes (Signed)
Pt state right arm and hand pain. Pt state her hands ways shakes. Pt state it hard for her to grip or hold items. Pt state she take sover the counter pain meds tohelp eas eher pain. Pt state she left handed.  Numeric Pain Rating Scale and Functional Assessment Average Pain 7   In the last MONTH (on 0-10 scale) has pain interfered with the following?  1. General activity like being  able to carry out your everyday physical activities such as walking, climbing stairs, carrying groceries, or moving a chair?  Rating(10)   -BT, -Dye Allergies.

## 2022-01-14 ENCOUNTER — Encounter: Payer: Self-pay | Admitting: Neurology

## 2022-01-14 ENCOUNTER — Ambulatory Visit (INDEPENDENT_AMBULATORY_CARE_PROVIDER_SITE_OTHER): Payer: Commercial Managed Care - PPO | Admitting: Neurology

## 2022-01-14 VITALS — BP 153/100 | HR 89 | Ht 61.0 in | Wt 174.0 lb

## 2022-01-14 DIAGNOSIS — R0683 Snoring: Secondary | ICD-10-CM

## 2022-01-14 DIAGNOSIS — R0981 Nasal congestion: Secondary | ICD-10-CM

## 2022-01-14 DIAGNOSIS — Z9189 Other specified personal risk factors, not elsewhere classified: Secondary | ICD-10-CM | POA: Insufficient documentation

## 2022-01-14 DIAGNOSIS — G4726 Circadian rhythm sleep disorder, shift work type: Secondary | ICD-10-CM

## 2022-01-14 DIAGNOSIS — M2619 Other specified anomalies of jaw-cranial base relationship: Secondary | ICD-10-CM | POA: Diagnosis not present

## 2022-01-14 DIAGNOSIS — G478 Other sleep disorders: Secondary | ICD-10-CM

## 2022-01-14 NOTE — Progress Notes (Addendum)
SLEEP MEDICINE CLINIC    Provider:  Larey Seat, MD  Primary Care Physician:  [facility]   Ann Dancer, NP [place] [address]     Referring Provider: Charyl Cook, [address] [place],  [place] [address]          Chief Complaint according to patient   Patient presents with:     New Patient (Initial Visit)           HISTORY OF PRESENT ILLNESS:    [name] is a 56 y.o. year old Black or [place] American female patient who was seen  on 01/14/2022 from PCP/ NP  for an evaluation of snoring. .  Chief concern according to patient :  01-14-2022   [name]  has a past medical history of Anemia, Asthma, Bursitis of left hip, Decreased appetite, GERD (gastroesophageal reflux disease), History of hiatal hernia, History of stomach ulcers, Hypertension, Migraine, PONV (postoperative nausea and vomiting), and Shortness of breath,  dyspnea in daytime and night time. GERD has affected sleep. Snoring.      Sleep relevant medical history: Snoring, SOB,  Nocturia 3-5 times (!) Tonsillectomy and adenoidectomy at age 68 , frequent sinus congestion, nasal rhinitis.   Family medical /sleep history:  late sister had OSA,  CHF. Mother had DM,    Social history:  Patient is working  2 jobs - one as Designer, television/film set-  [employer] and  at [employer]   She lives in a household  alone. No pets, with 2 adult children, The patient currently work.Pets are ** present. Tobacco use- none since 2015.  ETOH use ; sometimes- 1-2 /month,  Caffeine intake in form of Coffee( 1 cup in AM ) Soda( /) Tea ( some) or energy drinks. Regular exercise complicated by SOB, goes to gym 3/ week. Fatigue.        Sleep habits are as follows: The patient's dinner time is between 5-6.30 PM. The patient goes to bed at 8-10 PM and is asleep within less than one hour-continues to sleep for intervals of 1-2 hours, wakes for many times for bathroom breaks,  the first time at 12 AM.   The preferred sleep position is prone , with the support of 2-3 pillows.  Dreams are reportedly frequent/vivid.  5  AM is the usual rise time. The patient wakes up spontaneously.   She reports not feeling refreshed or restored in AM, with symptoms such as dry mouth, morning headaches, and residual fatigue.  Naps are taken infrequently, she likes to nap- but has no opportunity- lasting from 1-2 hours - are more refreshing than nocturnal sleep.    Review of Systems: Out of a complete 14 system review, the patient complains of only the following symptoms, and all other reviewed systems are negative.:  Fatigue, sleepiness , snoring, fragmented sleep, Insomnia - broken sleep, nocturia, night shift worker, on call work-     How likely are you to doze in the following situations: 0 = not likely, 1 = slight chance, 2 = moderate chance, 3 = high chance   Sitting and Reading? Watching Television? Sitting inactive in a public place (theater or meeting)? As a passenger in a car for an hour without a break? Lying down in the afternoon when circumstances permit? Sitting and talking to someone? Sitting quietly after lunch without alcohol? In a car, while stopped for a few minutes in traffic?   Total =  9/ 18 points   FSS endorsed at 34/ 63 points.   Social History   Socioeconomic History   Marital status: Single    Spouse name: Not on file   Number of children: Not on file   Years of education: Not on file   Highest education level: Not on file  Occupational History   Occupation: residential worker  Tobacco Use   Smoking status: Former    Years: 1.00    Types: Cigarettes    Quit date: 06/01/2014    Years since quitting: 7.6   Smokeless tobacco: Never  Vaping Use   Vaping Use: Never used  Substance and Sexual Activity   Alcohol use: Not Currently    Alcohol/week: 0.0 standard drinks of alcohol   Drug use: No   Sexual activity: Yes  Other Topics Concern    Not on file  Social History Narrative   Not on file   Social Determinants of Health   Financial Resource Strain: Not on file  Food Insecurity: Not on file  Transportation Needs: Not on file  Physical Activity: Not on file  Stress: Not on file  Social Connections: Not on file    Family History  Problem Relation Age of Onset   Cancer Mother 52       unknown   Heart attack Father 52   Heart attack Sister 54   Diabetes Sister     Past Medical History:  Diagnosis Date   Anemia    Asthma    Bursitis of left hip    Decreased appetite    GERD (gastroesophageal reflux disease)    History of hiatal hernia    repaired   History of stomach ulcers    Hypertension    Migraine    "maybe twice in the last 10 yrs" (10/16/2013)   PONV (postoperative nausea and vomiting)    Shortness of breath dyspnea     Past Surgical History:  Procedure Laterality Date   BARTHOLIN GLAND CYST EXCISION     BREAST LUMPECTOMY WITH RADIOACTIVE SEED LOCALIZATION Left 01/23/2016   Procedure: BREAST LUMPECTOMY WITH RADIOACTIVE SEED LOCALIZATION;  Surgeon: Jackolyn Confer, MD;  Location: [facility];  Service: General;  Laterality: Left;   [place]  ~ [place]  07/2003   HERNIA REPAIR     INCISION AND DRAINAGE ABSCESS  10/2005   Bartholin abscess.   JOINT REPLACEMENT     Right   TONSILLECTOMY AND ADENOIDECTOMY  1983   TOTAL HIP ARTHROPLASTY Right 06/05/2017   TOTAL HIP ARTHROPLASTY Right 06/06/2017   Procedure: RIGHT TOTAL HIP ARTHROPLASTY ANTERIOR APPROACH;  Surgeon: Rod Can, MD;  Location: [facility];  Service: Orthopedics;  Laterality: Right;  Needs RNFA   TUBAL LIGATION  1994     Current Outpatient Medications on File Prior to Visit  Medication Sig Dispense Refill   acetaminophen (TYLENOL) 500 MG tablet Take 1 tablet (500 mg total) by mouth every 6 (six) hours as needed. (Patient taking differently: Take 1,000 mg by mouth every 6 (six) hours  as needed for mild pain.) 30 tablet 0   albuterol (PROVENTIL HFA;VENTOLIN HFA) 108 (90 Base) MCG/ACT inhaler Inhale 2 puffs into the lungs every 6 (six) hours as needed for wheezing or shortness of breath. 1 Inhaler 2   albuterol (PROVENTIL) (2.5 MG/3ML) 0.083% nebulizer solution Take 3 mLs (2.5 mg total) by nebulization every 6 (six) hours as needed for wheezing or shortness of breath. 75  mL 2   amLODipine (NORVASC) 10 MG tablet Take 1 tablet (10 mg total) by mouth daily. 90 tablet 3   budesonide-formoterol (SYMBICORT) 80-4.5 MCG/ACT inhaler INHALE TWO PUFFS BY MOUTH INTO LUNGS TWICE DAILY 1 Inhaler 11   cetirizine (ZYRTEC) 10 MG tablet Take 10 mg by mouth daily.     diclofenac sodium (VOLTAREN) 1 % GEL Apply 4 g topically 4 (four) times daily. 100 g 3   fluticasone (FLONASE) 50 MCG/ACT nasal spray USE TWO SPRAY(S) IN EACH NOSTRIL ONCE DAILY 16 g 0   guaiFENesin (ROBITUSSIN) 100 MG/5ML liquid Take 5 mLs by mouth every 4 (four) hours as needed for cough or to loosen phlegm. 120 mL 0   naproxen (NAPROSYN) 500 MG tablet TAKE 1 TABLET BY MOUTH TWICE A DAY WITH MEALS 60 tablet 0   traZODone (DESYREL) 50 MG tablet TAKE 0.5-1 TABLETS BY MOUTH AT BEDTIME AS NEEDED FOR SLEEP. 90 tablet 0   ferrous sulfate 325 (65 FE) MG tablet Take 1 tablet (325 mg total) by mouth daily. 30 tablet 3   No current facility-administered medications on file prior to visit.    Allergies  Allergen Reactions   Amoxicillin Rash    PATIENT HAS HAD A PCN REACTION WITH IMMEDIATE RASH, FACIAL/TONGUE/THROAT SWELLING, SOB, OR LIGHTHEADEDNESS WITH HYPOTENSION:  #  #  #  YES  #  #  #   HAS PT DEVELOPED SEVERE RASH INVOLVING MUCUS MEMBRANES or SKIN NECROSIS: #  #  #  YES  #  #  #     "On my face" Has patient had a PCN reaction that required hospitalization: No Has patient had a PCN reaction occurring within the last 10 years:  #  #  #  UNKNOWN  #  #  #  .    Ampicillin Rash    PATIENT HAS HAD A PCN REACTION WITH IMMEDIATE RASH,  FACIAL/TONGUE/THROAT SWELLING, SOB, OR LIGHTHEADEDNESS WITH HYPOTENSION: # # # YES # # #  HAS PT DEVELOPED SEVERE RASH INVOLVING MUCUS MEMBRANES or SKIN NECROSIS: # # # YES # # # "On my face"e Has patient had a PCN reaction that required hospitalization: No Has patient had a PCN reaction occurring within the last 10 years:  #  #  #  UNKNOWN  #  #  #       Mobic [Meloxicam] Rash   Montelukast Sodium Rash   Phenergan [Promethazine Hcl] Nausea And Vomiting    Physical exam:  Today's Vitals   01/14/22 1454  BP: (!) 153/100  Pulse: 89  Weight: 174 lb (78.9 kg)  Height: '5\' 1"'$  (1.549 m)   Body mass index is 32.88 kg/m.   Wt Readings from Last 3 Encounters:  01/14/22 174 lb (78.9 kg)  11/18/21 175 lb 6.4 oz (79.6 kg)  10/16/21 173 lb 12.8 oz (78.8 kg)     Ht Readings from Last 3 Encounters:  01/14/22 '5\' 1"'$  (1.549 m)  10/16/21 '5\' 1"'$  (1.549 m)  06/08/21 '5\' 1"'$  (1.549 m)      General: The patient is awake, alert and appears not in acute distress.  Head: Normocephalic, atraumatic. Neck is supple. Mallampati 3,  neck circumference:15.5  inches . Nasal airflow not patent.  Retrognathia is  seen.  Dental status: biological  Cardiovascular:  Regular rate and cardiac rhythm by pulse,  without distended neck veins. Respiratory: Lungs are clear to auscultation.  Skin:  With evidence of ankle edema, mostly right ankle- Trunk: The patient's posture is erect.  Neurologic exam : The patient is awake and alert, oriented to place and time.   Memory subjective described as intact.  Attention span & concentration ability appears normal.  Speech is fluent,  with dysphonia-  Mood and affect are tired, affected by  fatigue.    Cranial nerves: no loss of smell or taste reported  Pupils are equal and briskly reactive to light. Funduscopic exam deferred.  Extraocular movements in vertical and horizontal planes were intact and without nystagmus. No Diplopia. Visual fields by finger perimetry are  intact. Hearing was intact to soft voice and finger rubbing.    Facial sensation intact to fine touch.  Facial motor strength is symmetric and tongue and uvula move midline.  Neck ROM : rotation, tilt and flexion extension were normal for age and shoulder shrug was symmetrical.    Motor exam:  Symmetric bulk, tone and ROM.   Normal tone without cog- wheeling, symmetric grip strength .   Sensory:  Fine touch, vibration were tested  and  normal.  Proprioception tested in the upper extremities was normal.   Coordination: Rapid alternating movements in the fingers/hands were of normal speed.  The Finger-to-nose maneuver was intact without evidence of ataxia, dysmetria or tremor.   Gait and station: Patient could rise unassisted from a seated position, walked without assistive device.  Stance is of normal width/ base .  Toe and heel walk were deferred.  Deep tendon reflexes: in the  upper and lower extremities are symmetric and intact.  Babinski response was deferred         After spending a total time of 45  minutes face to face and additional time for physical and neurologic examination, review of laboratory studies,  personal review of imaging studies, reports and results of other testing and review of referral information / records as far as provided in visit, I have established the following assessments:  1) restricted sleep time, fragmented sleep , nocturia and irregular work hours, some nights , some days.  2) sleep choking, snoring, and non restorative sleep in a patient with obesity, retrognthia, nasal congestion and sinus congestion.  3) Has been responding to Trazodone when she needs a sleep aid- PCP provided.    My Plan is to proceed with:  1) HST- due to this patients risk factors and irregular sleep hours an in lab study is hard to plan- she has best sleep at home.  2) sleep hygiene- darken your room, keep screens off- sleeps with phone on- she never turns her phone off. On  call and wants to be in reach for her [place]-of[place] lacated children. Bedroom is cool, quiet and not dark- no TV in bedroom.  3) hydration!  4) Consider a multivitamin or even a prenatal vitamin to correct anemia.   I would like to thank Ann Dancer, NP and [name], [place],  [place] [address] for allowing me to meet with and to take care of this pleasant patient.   In short, [name] is presenting with possible OSA, shift work sleep disorder and insomnia, I plan to follow up either personally or through our NP within 3 months.   CC: I will share my notes with PCP.  Electronically signed by: Larey Seat, MD 01/14/2022 3:17 PM  [facility] Board certified by The [employer] of Sleep Medicine and Diplomate of the [employer] of Sleep Medicine. Board certified In Neurology through the [place], Fellow of  the [employer] of Neurology. Medical Director of [facility].

## 2022-01-14 NOTE — Patient Instructions (Signed)
Please remember to try to maintain good sleep hygiene, which means: Keep a regular sleep and wake schedule, try not to exercise or have a meal within 2 hours of your bedtime, try to keep your bedroom conducive for sleep, that is, cool and dark, without light distractors such as an illuminated alarm clock, and refrain from watching TV right before sleep or in the middle of the night and do not keep the TV or radio on during the night. Also, try not to use or play on electronic devices at bedtime, such as your cell phone, tablet PC or laptop. If you like to read at bedtime on an electronic device, try to dim the background light as much as possible. Do not eat in the middle of the night.   We will request a sleep study.    We will look for oxygen saturation and snoring or sleep apnea.   For chronic insomnia, you are best followed by a psychiatrist and/or sleep psychologist.   We will call you with the sleep study results and make a follow up appointment if needed.    Screening for Sleep Apnea  Sleep apnea is a condition in which breathing pauses or becomes shallow during sleep. Sleep apnea screening is a test to determine if you are at risk for sleep apnea. The test includes a series of questions. It will only takes a few minutes. Your health care provider may ask you to have this test in preparation for surgery or as part of a physical exam. What are the symptoms of sleep apnea? Common symptoms of sleep apnea include: Snoring. Waking up often at night. Daytime sleepiness. Pauses in breathing. Choking or gasping during sleep. Irritability. Forgetfulness. Trouble thinking clearly. Depression. Personality changes. Most people with sleep apnea do not know that they have it. What are the advantages of sleep apnea screening? Getting screened for sleep apnea can help: Ensure your safety. It is important for your health care providers to know whether or not you have sleep apnea, especially if you  are having surgery or have other long-term (chronic) health conditions. Improve your health and allow you to get a better night's rest. Restful sleep can help you: Have more energy. Lose weight. Improve high blood pressure. Improve diabetes management. Prevent stroke. Prevent car accidents. What happens during the screening? Screening usually includes being asked a list of questions about your sleep quality. Some questions you may be asked include: Do you snore? Is your sleep restless? Do you have daytime sleepiness? Has a partner or spouse told you that you stop breathing during sleep? Have you had trouble concentrating or memory loss? What is your age? What is your neck circumference? To measure your neck, keep your back straight and gently wrap the tape measure around your neck. Put the tape measure at the middle of your neck, between your chin and collarbone. What is your sex assigned at birth? Do you have or are you being treated for high blood pressure? If your screening test is positive, you are at risk for the condition. Further testing may be needed to confirm a diagnosis of sleep apnea. Where to find more information You can find screening tools online or at your health care clinic. For more information about sleep apnea screening and healthy sleep, visit these websites: Centers for Disease Control and Prevention: http://www.wolf.info/ American Sleep Apnea Association: www.sleepapnea.org Contact a health care provider if: You think that you may have sleep apnea. Summary Sleep apnea screening can help determine  if you are at risk for sleep apnea. It is important for your health care providers to know whether or not you have sleep apnea, especially if you are having surgery or have other chronic health conditions. You may be asked to take a screening test for sleep apnea in preparation for surgery or as part of a physical exam. This information is not intended to replace advice given to  you by your health care provider. Make sure you discuss any questions you have with your health care provider. Document Revised: 05/30/2020 Document Reviewed: 05/30/2020 Elsevier Patient Education  [place] Sleep Information, Adult Quality sleep is important for your mental and physical health. It also improves your quality of life. Quality sleep means you: Are asleep for most of the time you are in bed. Fall asleep within 30 minutes. Wake up no more than once a night.  Are awake for no longer than 20 minutes if you do wake up during the night. Most adults need 7-8 hours of quality sleep each night. How can poor sleep affect me? If you do not get enough quality sleep, you may have: Mood swings. Daytime sleepiness. Confusion. Decreased reaction time. Sleep disorders, such as insomnia and sleep apnea. Difficulty with: Solving problems. Coping with stress. Paying attention. These issues may affect your performance and productivity at work, school, and at home. Lack of sleep may also put you at higher risk for accidents, suicide, and risky behaviors. If you do not get quality sleep you may also be at higher risk for several health problems, including: Infections. Type 2 diabetes. Heart disease. High blood pressure. Obesity. Worsening of long-term conditions, like arthritis, kidney disease, depression, Parkinson's disease, and epilepsy. What actions can I take to get more quality sleep?     Stick to a sleep schedule. Go to sleep and wake up at about the same time each day. Do not try to sleep less on weekdays and make up for lost sleep on weekends. This does not work. Try to get about 30 minutes of exercise on most days. Do not exercise 2-3 hours before going to bed. Limit naps during the day to 30 minutes or less. Do not use any products that contain nicotine or tobacco, such as cigarettes or e-cigarettes. If you need help quitting, ask your health care  provider. Do not drink caffeinated beverages for at least 8 hours before going to bed. Coffee, tea, and some sodas contain caffeine. Do not drink alcohol close to bedtime. Do not eat large meals close to bedtime. Do not take naps in the late afternoon. Try to get at least 30 minutes of sunlight every day. Morning sunlight is best. Make time to relax before bed. Reading, listening to music, or taking a hot bath promotes quality sleep. Make your bedroom a place that promotes quality sleep. Keep your bedroom dark, quiet, and at a comfortable room temperature. Make sure your bed is comfortable. Take out sleep distractions like TV, a computer, smartphone, and bright lights. If you are lying awake in bed for longer than 20 minutes, get up and do a relaxing activity until you feel sleepy. Work with your health care provider to treat medical conditions that may affect sleeping, such as: Nasal obstruction. Snoring. Sleep apnea and other sleep disorders. Talk to your health care provider if you think any of your prescription medicines may cause you to have difficulty falling or staying asleep. If you have sleep problems, talk with a sleep consultant. If you  think you have a sleep disorder, talk with your health care provider about getting evaluated by a specialist. Where to find more information [place] website: https://sleepfoundation.org National Heart, Lung, and [place] ([place]): http://www.saunders.info/.pdf Centers for Disease Control and Prevention (CDC): LearningDermatology.pl Contact a health care provider if you: Have trouble getting to sleep or staying asleep. Often wake up very early in the morning and cannot get back to sleep. Have daytime sleepiness. Have daytime sleep attacks of suddenly falling asleep and sudden muscle weakness (narcolepsy). Have a tingling sensation in your legs with a strong urge to move your legs (restless  legs syndrome). Stop breathing briefly during sleep (sleep apnea). Think you have a sleep disorder or are taking a medicine that is affecting your quality of sleep. Summary Most adults need 7-8 hours of quality sleep each night. Getting enough quality sleep is an important part of health and well-being. Make your bedroom a place that promotes quality sleep and avoid things that may cause you to have poor sleep, such as alcohol, caffeine, smoking, and large meals. Talk to your health care provider if you have trouble falling asleep or staying asleep. This information is not intended to replace advice given to you by your health care provider. Make sure you discuss any questions you have with your health care provider. Document Revised: 07/27/2021 Document Reviewed: 04/20/2021 Elsevier Patient Education  [place].
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
## 2022-01-15 NOTE — Procedures (Signed)
EMG & NCV Findings: All nerve conduction studies (as indicated in the following tables) were within normal limits.    Needle evaluation of the right first dorsal interosseous, the right abductor pollicis brevis, and the right extensor digitorum communis muscles showed increased insertional activity, moderately increased spontaneous activity, increased motor unit amplitude, and diminished recruitment.  The right triceps muscle showed increased insertional activity, slightly increased spontaneous activity, and diminished recruitment.  The right deltoid muscle showed increased insertional activity, slightly increased spontaneous activity, increased motor unit amplitude, and diminished recruitment.    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  Cannot rule out brachial plexopathy. Clinically does not seem to fit with Parsonage-Turner syndrome. There is no significant electrodiagnostic evidence of any other focal nerve entrapment or generalized peripheral neuropathy.   Recommendations: 1.  Follow-up with referring physician. Highly suggest MRI of cervical spine and possibly brachial plexus. 2.  Continue current management of symptoms.  ___________________________ Laurence Spates FAAPMR Board Certified, American Board of Physical Medicine and Rehabilitation    Nerve Conduction Studies Anti Sensory Summary Table   Stim Site NR Peak (ms) Norm Peak (ms) P-T Amp (V) Norm P-T Amp Site1 Site2 Delta-P (ms) Dist (cm) Vel (m/s) Norm Vel (m/s)  Right Median Acr Palm Anti Sensory (2nd Digit)  31.2C  Wrist    3.3 <3.6 22.2 >10 Wrist Palm 1.5 0.0    Palm    1.8 <2.0 20.8         Right Radial Anti Sensory (Base 1st Digit)  31.8C  Wrist    2.0 <3.1 19.7  Wrist Base 1st Digit 2.0 0.0    Right Ulnar Anti Sensory (5th Digit)  31.8C  Wrist    3.3 <3.7 15.9 >15.0 Wrist 5th Digit 3.3 14.0 42 >38   Motor Summary  Table   Stim Site NR Onset (ms) Norm Onset (ms) O-P Amp (mV) Norm O-P Amp Site1 Site2 Delta-0 (ms) Dist (cm) Vel (m/s) Norm Vel (m/s)  Right Median Motor (Abd Poll Brev)  31.9C  Wrist    3.5 <4.2 9.8 >5 Elbow Wrist 4.0 20.0 50 >50  Elbow    7.5  9.1         Right Ulnar Motor (Abd Dig Min)  32C  Wrist    2.8 <4.2 7.7 >3 B Elbow Wrist 3.4 18.5 54 >53  B Elbow    6.2  7.2  A Elbow B Elbow 1.8 11.0 61 >53  A Elbow    8.0  7.5          EMG   Side Muscle Nerve Root Ins Act Fibs Psw Amp Dur Poly Recrt Int Fraser Din Comment  Right 1stDorInt Ulnar C8-T1 *Incr *2+ *2+ *Incr Nml 0 *Reduced Nml   Right Abd Poll Brev Median C8-T1 *Incr *2+ *2+ *Incr Nml 0 *Reduced Nml   Right ExtDigCom   *Incr *2+ *2+ *Incr Nml 0 *Reduced Nml   Right Triceps Radial C6-7-8 *Incr *1+ *1+ Nml Nml 0 *Reduced Nml   Right Deltoid Axillary C5-6 *Incr *1+ *1+ *Incr Nml 0 *Reduced Nml     Nerve Conduction Studies Anti Sensory Left/Right Comparison   Stim Site L Lat (ms) R Lat (ms) L-R Lat (ms) L Amp (V) R Amp (V) L-R Amp (%) Site1 Site2 L Vel (m/s) R Vel (m/s) L-R Vel (m/s)  Median Acr Palm Anti Sensory (2nd Digit)  31.2C  Wrist  3.3  22.2  Wrist Palm     Palm  1.8   20.8        Radial Anti Sensory (Base 1st Digit)  31.8C  Wrist  2.0   19.7  Wrist Base 1st Digit     Ulnar Anti Sensory (5th Digit)  31.8C  Wrist  3.3   15.9  Wrist 5th Digit  42    Motor Left/Right Comparison   Stim Site L Lat (ms) R Lat (ms) L-R Lat (ms) L Amp (mV) R Amp (mV) L-R Amp (%) Site1 Site2 L Vel (m/s) R Vel (m/s) L-R Vel (m/s)  Median Motor (Abd Poll Brev)  31.9C  Wrist  3.5   9.8  Elbow Wrist  50   Elbow  7.5   9.1        Ulnar Motor (Abd Dig Min)  32C  Wrist  2.8   7.7  B Elbow Wrist  54   B Elbow  6.2   7.2  A Elbow B Elbow  61   A Elbow  8.0   7.5           Waveforms:

## 2022-01-20 ENCOUNTER — Ambulatory Visit (INDEPENDENT_AMBULATORY_CARE_PROVIDER_SITE_OTHER): Payer: Commercial Managed Care - PPO | Admitting: Orthopaedic Surgery

## 2022-01-20 ENCOUNTER — Encounter: Payer: Self-pay | Admitting: Orthopaedic Surgery

## 2022-01-20 DIAGNOSIS — M542 Cervicalgia: Secondary | ICD-10-CM | POA: Diagnosis not present

## 2022-01-20 DIAGNOSIS — R202 Paresthesia of skin: Secondary | ICD-10-CM | POA: Diagnosis not present

## 2022-01-20 DIAGNOSIS — M792 Neuralgia and neuritis, unspecified: Secondary | ICD-10-CM

## 2022-01-20 NOTE — Progress Notes (Signed)
The patient comes in today after having steroid injections in both her shoulders due to pain in both her shoulders.  However, of more concern was right upper extremity weakness and radicular symptoms that she was having that warranted nerve conduction studies of the right upper extremity.  She returns to go over those results as well.  She said the steroid injections did not help at all with her shoulders or with her pain.  On exam her shoulders move smoothly and fluidly and it does not seem to be that there is pain in her shoulders itself.  She does have weakness in that right upper extremity and numbness and tingling going down her right arm.  The nerve conduction studies were concerning for the fact that it shows evidence of a severe radiculopathy on the right side at C5, C6, C7 and C8.  I believe that both of her upper extremity findings are related to this severe radiculopathy this seems to be associated with the cervical spine.  Given the severity seen on nerve conduction studies, a MRI of the cervical spine is medically warranted to rule out nerve compression and cord compression.  We will see if we get this study ordered soon and then will go from there in terms of treatment recommendations and options.  She agrees with this treatment plan.

## 2022-01-21 NOTE — Addendum Note (Signed)
Addended by: Robyne Peers on: 01/21/2022 11:32 AM   Modules accepted: Orders

## 2022-01-27 ENCOUNTER — Ambulatory Visit: Payer: Commercial Managed Care - PPO | Admitting: Orthopaedic Surgery

## 2022-01-31 ENCOUNTER — Ambulatory Visit
Admission: RE | Admit: 2022-01-31 | Discharge: 2022-01-31 | Disposition: A | Discharge status: Home / Self Care | Payer: Commercial Managed Care - PPO | Source: Ambulatory Visit | Attending: Orthopaedic Surgery | Admitting: Orthopaedic Surgery

## 2022-01-31 DIAGNOSIS — M542 Cervicalgia: Secondary | ICD-10-CM

## 2022-02-01 ENCOUNTER — Other Ambulatory Visit: Payer: Self-pay | Admitting: Orthopaedic Surgery

## 2022-02-01 ENCOUNTER — Telehealth: Payer: Self-pay | Admitting: Orthopaedic Surgery

## 2022-02-01 MED ORDER — DIAZEPAM 5 MG PO TABS: 5.0000 mg | ORAL_TABLET | Freq: Once | ORAL | 0 refills | Status: AC

## 2022-02-01 NOTE — Telephone Encounter (Signed)
Patient called advised she could not get her MRI done yesterday because she is claustrophobic. Patient said something else will need to be done in order for her to have the MRI. Patient said she will reschedule her MRI after she hears from Dr. Ninfa Linden. The number to contact patient is [phone].

## 2022-02-02 NOTE — Telephone Encounter (Signed)
LMOM for patient that this was called in for her

## 2022-02-02 NOTE — Telephone Encounter (Signed)
Patient called back returning your phone call. CB # [phone]

## 2022-02-02 NOTE — Telephone Encounter (Signed)
Called patient back no answer 

## 2022-02-03 ENCOUNTER — Telehealth: Payer: Self-pay | Admitting: Nurse Practitioner

## 2022-02-03 NOTE — Telephone Encounter (Signed)
Caller Name: Shellia Creighton  Call back phone #: [phone]  Reason for Call: Pt states that [employer] had been trying to reach this office to speak with Lauren regarding a prescription. Asked that she call [phone] to speak with [employer] and then get back with her at [phone]

## 2022-02-04 ENCOUNTER — Other Ambulatory Visit: Payer: Self-pay

## 2022-02-04 MED ORDER — TRAZODONE HCL 50 MG PO TABS: ORAL_TABLET | ORAL | 0 refills | Status: DC

## 2022-02-04 MED ORDER — NAPROXEN 500 MG PO TABS: 500.0000 mg | ORAL_TABLET | Freq: Two times a day (BID) | ORAL | 1 refills | Status: DC

## 2022-02-04 NOTE — Telephone Encounter (Signed)
Called and ldvm. Pt to cb if she has any issues with medications. Sw, cma

## 2022-02-11 ENCOUNTER — Telehealth: Payer: Self-pay | Admitting: Nurse Practitioner

## 2022-02-11 NOTE — Telephone Encounter (Signed)
Called and ldvm. Sw,cma Advised to call back directly if there are further questions. 

## 2022-02-11 NOTE — Telephone Encounter (Signed)
Caller Name: Pt Call back phone #: [phone]  Reason for Call: Pt is frustrated because the pharmacy told her that Lauren had put in her records that she has an allergy to Nsaids and it caused an issue with her meds. She wants a call back.

## 2022-02-23 ENCOUNTER — Other Ambulatory Visit: Payer: Self-pay

## 2022-02-23 DIAGNOSIS — I10 Essential (primary) hypertension: Secondary | ICD-10-CM

## 2022-02-23 MED ORDER — AMLODIPINE BESYLATE 10 MG PO TABS: 10.0000 mg | ORAL_TABLET | Freq: Every day | ORAL | 3 refills | Status: DC

## 2022-02-23 MED ORDER — NAPROXEN 500 MG PO TABS: 500.0000 mg | ORAL_TABLET | Freq: Two times a day (BID) | ORAL | 2 refills | Status: DC

## 2022-03-09 ENCOUNTER — Telehealth: Payer: Self-pay | Admitting: Student

## 2022-03-09 NOTE — Telephone Encounter (Signed)
Patient returned call. States she wants to know last 2 step TB and flu vaccine. Explained last 2 step was in 2020. She is also due for this season's flu vaccine. She will contact her PCP to get these done for work.

## 2022-03-09 NOTE — Telephone Encounter (Signed)
Pls call the patient back about her last shots taken.

## 2022-03-09 NOTE — Telephone Encounter (Signed)
Returned call to patient. No answer. Left message on VM requesting return call.  °

## 2022-03-12 ENCOUNTER — Telehealth: Payer: Self-pay | Admitting: Nurse Practitioner

## 2022-03-12 NOTE — Telephone Encounter (Signed)
Pt has an OV scheduled for a tb test on 03/19/22 which is a Friday .should this be a nurse visit? If so is it going to need orders placed?

## 2022-03-16 ENCOUNTER — Telehealth: Payer: Self-pay | Admitting: Neurology

## 2022-03-16 NOTE — Telephone Encounter (Signed)
HST- UMR no auth req ref # [id number] on 03/15/22.  Patient is scheduled at [facility] for 03/29/22 at 3:30 pm.  Mailed packet to the patient.

## 2022-03-18 NOTE — Progress Notes (Unsigned)
   Established Patient Office Visit  Subjective   Patient ID: [name], female    DOB: [birth year]  Age: 56 y.o. MRN: [id number]  No chief complaint on file.   HPI  [name] is here to follow-up on prediabetes.   {History (Optional):23778}  ROS    Objective:     There were no vitals taken for this visit. {Vitals History (Optional):23777}  Physical Exam   No results found for any visits on 03/19/22.  {Labs (Optional):23779}  The 10-year ASCVD risk score (Arnett DK, et al., 2019) is: 9.5%    Assessment & Plan:   Problem List Items Addressed This Visit   None   No follow-ups on file.    Charyl Dancer, NP

## 2022-03-19 ENCOUNTER — Ambulatory Visit: Payer: Commercial Managed Care - PPO | Admitting: Nurse Practitioner

## 2022-03-19 ENCOUNTER — Encounter: Payer: Self-pay | Admitting: Nurse Practitioner

## 2022-03-19 VITALS — BP 128/80 | HR 84 | Temp 97.0°F | Wt 175.0 lb

## 2022-03-19 DIAGNOSIS — Z111 Encounter for screening for respiratory tuberculosis: Secondary | ICD-10-CM

## 2022-03-19 DIAGNOSIS — R7303 Prediabetes: Secondary | ICD-10-CM

## 2022-03-19 DIAGNOSIS — Z23 Encounter for immunization: Secondary | ICD-10-CM | POA: Diagnosis not present

## 2022-03-19 DIAGNOSIS — M545 Low back pain, unspecified: Secondary | ICD-10-CM

## 2022-03-19 DIAGNOSIS — R0683 Snoring: Secondary | ICD-10-CM

## 2022-03-19 DIAGNOSIS — G8929 Other chronic pain: Secondary | ICD-10-CM

## 2022-03-19 LAB — POCT GLYCOSYLATED HEMOGLOBIN (HGB A1C)
HbA1c POC (<> result, manual entry): 6.3 % (ref 4.0–5.6)
HbA1c, POC (controlled diabetic range): 6.3 % (ref 0.0–7.0)
HbA1c, POC (prediabetic range): 6.3 % (ref 5.7–6.4)
Hemoglobin A1C: 6.3 % — AB (ref 4.0–5.6)

## 2022-03-19 MED ORDER — CELECOXIB 100 MG PO CAPS: 100.0000 mg | ORAL_CAPSULE | Freq: Two times a day (BID) | ORAL | 1 refills | Status: DC

## 2022-03-19 MED ORDER — TRAZODONE HCL 50 MG PO TABS: ORAL_TABLET | ORAL | 1 refills | Status: DC

## 2022-03-19 NOTE — Assessment & Plan Note (Signed)
She is still having ongoing issues with snoring and would like a referral to ENT to further examine her nasal turbinates and see if anything is causing her symptoms.  Referral placed.

## 2022-03-19 NOTE — Assessment & Plan Note (Signed)
Her A1c has improved to 6.3%.  Discussed ongoing adjustments to nutrition and exercise.  Limit carbohydrates and added sugars.  Follow-up in 6 months.

## 2022-03-19 NOTE — Patient Instructions (Addendum)
It was great to see you!  Your A1c is back in the prediabetes range. Keep watching the amount of sugars and carbs that you are eating.   We are checking your TB blood test today  Switch from naproxen to celebrex to see if this helps with your pain, but doesn't hurt your stomach.   Let's follow-up in 1-2 months, sooner if you have concerns.  If a referral was placed today, you will be contacted for an appointment. Please note that routine referrals can sometimes take up to 3-4 weeks to process. Please call our office if you haven't heard anything after this time frame.  Take care,  Vance Peper, NP

## 2022-03-19 NOTE — Assessment & Plan Note (Signed)
Chronic, stable.  Her pain is currently well controlled, however when she takes the naproxen it upsets her stomach at times.  We will have her switch to Celebrex 100 mg twice a day as needed for pain.  She did have a rash with meloxicam, however she has been tolerating naproxen for the most part and ibuprofen.  Follow-up in 6 to 8 weeks.

## 2022-03-22 ENCOUNTER — Telehealth: Payer: Self-pay | Admitting: Nurse Practitioner

## 2022-03-22 NOTE — Telephone Encounter (Signed)
Pt is needing a letter stating she had a flu shot and her tb blood draw results. Please advise pt when ready @ [phone]

## 2022-03-22 NOTE — Telephone Encounter (Signed)
Still waiting on results for TB blood test. Once test has come back and reviewed, will print letter for pt. Sw, cma

## 2022-03-23 LAB — QUANTIFERON-TB GOLD PLUS
Mitogen-NIL: >10 IU/mL
NIL: 0.04 IU/mL
QuantiFERON-TB Gold Plus: NEGATIVE
TB1-NIL: 0.01 IU/mL
TB2-NIL: 0.01 IU/mL

## 2022-03-24 ENCOUNTER — Telehealth: Payer: Self-pay | Admitting: Nurse Practitioner

## 2022-03-24 NOTE — Telephone Encounter (Signed)
Pt called to pick up a copy of her blood draw and shot record from her appt on Friday. She said she would like to pick it uo today. Please call when ready.

## 2022-03-24 NOTE — Progress Notes (Signed)
Called and left detailed vm. Sw, cma

## 2022-03-24 NOTE — Telephone Encounter (Signed)
Both letter and immunization report printed and will be ready for pick up at the front office. Sw, cma

## 2022-03-29 ENCOUNTER — Ambulatory Visit: Payer: Commercial Managed Care - PPO | Admitting: Neurology

## 2022-03-29 ENCOUNTER — Telehealth: Payer: Self-pay | Admitting: Nurse Practitioner

## 2022-03-29 DIAGNOSIS — R0981 Nasal congestion: Secondary | ICD-10-CM

## 2022-03-29 DIAGNOSIS — G4733 Obstructive sleep apnea (adult) (pediatric): Secondary | ICD-10-CM

## 2022-03-29 DIAGNOSIS — M2619 Other specified anomalies of jaw-cranial base relationship: Secondary | ICD-10-CM

## 2022-03-29 DIAGNOSIS — G4731 Primary central sleep apnea: Secondary | ICD-10-CM

## 2022-03-29 DIAGNOSIS — G4726 Circadian rhythm sleep disorder, shift work type: Secondary | ICD-10-CM

## 2022-03-29 DIAGNOSIS — R0683 Snoring: Secondary | ICD-10-CM

## 2022-03-29 DIAGNOSIS — Z9189 Other specified personal risk factors, not elsewhere classified: Secondary | ICD-10-CM

## 2022-03-29 DIAGNOSIS — G478 Other sleep disorders: Secondary | ICD-10-CM

## 2022-03-29 DIAGNOSIS — G4730 Sleep apnea, unspecified: Secondary | ICD-10-CM

## 2022-03-29 MED ORDER — CELECOXIB 100 MG PO CAPS: 100.0000 mg | ORAL_CAPSULE | Freq: Two times a day (BID) | ORAL | 1 refills | Status: DC

## 2022-03-29 NOTE — Telephone Encounter (Signed)
Called pt and ldvm. Celebrex (Celecoxib) can only be ordered as capsules, per provider there is no option for medication to be in the form of tablets.  Also called [facility] and confirmed medication has been shipped to pt house and was delivered to address on file. Pt should have received med w/o any issues.   Sw, cma

## 2022-03-29 NOTE — Telephone Encounter (Signed)
Pt stated that the pharmacy said that the medication celecoxib need to be change to tablets not capsules and pt stated she needs it ASAP because of it's cycle

## 2022-04-01 NOTE — Progress Notes (Signed)
[facility] TEST REPORT ( by Watch PAT)   STUDY DATE:  04-02-2022 [id number]   ORDERING CLINICIAN: Larey Seat, MD  REFERRING CLINICIAN: Vance Peper, NP   CLINICAL INFORMATION/HISTORY: 01-14-2022   [name]  has a past medical history of Anemia, Asthma, Bursitis of left hip, Decreased appetite, GERD (gastroesophageal reflux disease), History of hiatal hernia, History of stomach ulcers, Hypertension, Migraine, PONV (postoperative nausea and vomiting), and Shortness of breath,  dyspnea in daytime and night time. GERD has affected sleep. Snoring. Nocturia up to 5 times.      Epworth sleepiness score: 9/24. FSS at 34/63   BMI: 33 kg/m   Neck Circumference: 15''   FINDINGS:   Sleep Summary:   Total Recording Time (hours, min):    The total recording time amounted to 7 hours and 27 minutes of which 6 hours and 44 minutes with a total sleep time, as calculated by the HST device.          Percent REM (%):     18.8%                                   Respiratory Indices:   Calculated pAHI (per hour):       91.0/h                      REM pAHI:   82.2/h                                              NREM pAHI:     93.1/h                         Positional AHI:    Most of the sleep time recorded was in left sided position with an AHI of 91.6 to 2/h and in supine position with an AHI of 101/h.                                               Oxygen Saturation Statistics:        O2 Saturation Range (%):     The nadir was 58% with a maximum of 100% and a mean saturation at 93%.                                   O2 Saturation (minutes) <89%:     42 minutes, over 10% of total sleep time.      Pulse Rate Statistics:   Pulse Mean (bpm):   73 bpm              Pulse Range:    Between 39 and 112 bpm .           IMPRESSION:  This HST confirms the presence of extreme severe sleep apnea accompanied by S severe and extreme hypoxemia.   There is bradycardia and tachycardia noted, neither is this severe apnea dependent on sleep position nor on REM sleep.  RECOMMENDATION: Emergently returning for an in lab titration as this patient may need very high levels of continuous or biphasic positive airway pressure.  There is also a question if she needs additional additional oxygen and a titration to oxygen can only be performed in an in laboratory sleep study with documentation of positive airway pressure effect. Plan B is providing CPAP auto titration between between 5 and 20 cmH2O with 2 cm EPR heated humidification and a mask of patient's choice.    INTERPRETING PHYSICIAN:   Larey Seat, MD   Medical Director of [facility] at [facility].

## 2022-04-04 DIAGNOSIS — G4730 Sleep apnea, unspecified: Secondary | ICD-10-CM | POA: Insufficient documentation

## 2022-04-04 DIAGNOSIS — G4731 Primary central sleep apnea: Secondary | ICD-10-CM | POA: Insufficient documentation

## 2022-04-04 NOTE — Addendum Note (Signed)
Addended by: Larey Seat on: 04/04/2022 12:13 PM   Modules accepted: Orders

## 2022-04-04 NOTE — Procedures (Signed)
[facility] TEST REPORT ( by Watch PAT)   STUDY DATE:  04-02-2022 [id number]   ORDERING CLINICIAN: Larey Seat, MD  REFERRING CLINICIAN: Vance Peper, NP   CLINICAL INFORMATION/HISTORY: 01-14-2022   [name]  has a past medical history of Anemia, Asthma, Bursitis of left hip, Decreased appetite, GERD (gastroesophageal reflux disease), History of hiatal hernia, History of stomach ulcers, Hypertension, Migraine, PONV (postoperative nausea and vomiting), and Shortness of breath,  dyspnea in daytime and night time. GERD has affected sleep. Snoring. Nocturia up to 5 times.      Epworth sleepiness score: 9/24. FSS at 34/63   BMI: 33 kg/m   Neck Circumference: 15''   FINDINGS:   Sleep Summary:   Total Recording Time (hours, min):    The total recording time amounted to 7 hours and 27 minutes of which 6 hours and 44 minutes with a total sleep time, as calculated by the HST device.          Percent REM (%):     18.8%                                   Respiratory Indices:   Calculated pAHI (per hour):       91.0/h                      REM pAHI:   82.2/h                                              NREM pAHI:     93.1/h                         Positional AHI:    Most of the sleep time recorded was in left sided position with an AHI of 91.6 to 2/h and in supine position with an AHI of 101/h.                                               Oxygen Saturation Statistics:        O2 Saturation Range (%):     The nadir was 58% with a maximum of 100% and a mean saturation at 93%.                                   O2 Saturation (minutes) <89%:     42 minutes, over 10% of total sleep time.      Pulse Rate Statistics:   Pulse Mean (bpm):   73 bpm              Pulse Range:    Between 39 and 112 bpm .           IMPRESSION:  This HST confirms the presence of extreme severe sleep apnea accompanied by S severe and extreme hypoxemia.   There is bradycardia and tachycardia noted, neither is this severe apnea dependent on sleep position nor on REM sleep.  RECOMMENDATION: Emergently returning for an in lab titration as this patient may need very high levels of continuous or biphasic positive airway pressure.  There is also a question if she needs additional additional oxygen and a titration to oxygen can only be performed in an in laboratory sleep study with documentation of positive airway pressure effect. Plan B is providing CPAP auto titration between between 5 and 20 cmH2O with 2 cm EPR heated humidification and a mask of patient's choice.    INTERPRETING PHYSICIAN:   Larey Seat, MD   Medical Director of [facility] at [facility].

## 2022-04-04 NOTE — Progress Notes (Signed)
75 % OSA and 25% CSA by HST, severe apnea with AHI over 90/h and severe hypoxia with nadir of 58% 02 and bradycardia.   Urgent return to PAP- and possible oxygen- titration  or , if not possible to schedule within 14 days, Plan B is providing CPAP ResMed auto titration between between 5 and 20 cmH2O with 2 cm EPR heated humidification and a mask of patient's choice. RV within 30-60 days on CPAP, please.

## 2022-04-06 ENCOUNTER — Telehealth: Payer: Self-pay | Admitting: Neurology

## 2022-04-06 ENCOUNTER — Telehealth: Payer: Self-pay | Admitting: *Deleted

## 2022-04-06 ENCOUNTER — Encounter: Payer: Self-pay | Admitting: *Deleted

## 2022-04-06 DIAGNOSIS — G4733 Obstructive sleep apnea (adult) (pediatric): Secondary | ICD-10-CM

## 2022-04-06 NOTE — Telephone Encounter (Signed)
Called and spoke w/ pt about results. See other phone note.

## 2022-04-06 NOTE — Telephone Encounter (Signed)
I called pt. I advised pt that Dr. Brett Fairy reviewed their sleep study results and found that pt has severe OSA. Dr. Brett Fairy recommends that pt start CPAP. I reviewed PAP compliance expectations with the pt. Pt is agreeable to starting a CPAP. I advised pt that an order will be sent to a DME, [facility] , and [facility]  will call the pt within about one week after they file with the pt's insurance. [facility]  will show the pt how to use the machine, fit for masks, and troubleshoot the CPAP if needed. A follow up appt was made for insurance purposes with SS,NP on 07/06/22 at 12:45pm. Pt verbalized understanding to arrive 15 minutes early and bring their CPAP. A letter with all of this information in it will be mailed to the pt as a reminder. I verified with the pt that the address we have on file is correct. Pt verbalized understanding of results. Pt had no questions at this time but was encouraged to call back if questions arise. I have sent the order to [facility]  and have received confirmation that they have received the order.

## 2022-04-06 NOTE — Telephone Encounter (Signed)
-----   Message from Larey Seat, MD sent at 04/04/2022 12:12 PM EDT ----- 75 % OSA and 25% CSA by HST, severe apnea with AHI over 90/h and severe hypoxia with nadir of 58% 02 and bradycardia.   Urgent return to PAP- and possible oxygen- titration  or , if not possible to schedule within 14 days, Plan B is providing CPAP ResMed auto titration between between 5 and 20 cmH2O with 2 cm EPR heated humidification and a mask of patient's choice. RV within 30-60 days on CPAP, please.

## 2022-04-06 NOTE — Telephone Encounter (Signed)
We don't have any urgent slots available in the sleep lab. Also, insurance will prefer patient to start Auto Pap.

## 2022-04-07 NOTE — Telephone Encounter (Signed)
[facility] doesn't accepts the patient's insurance. Will send the order to [facility].

## 2022-04-19 ENCOUNTER — Telehealth: Payer: Self-pay | Admitting: Nurse Practitioner

## 2022-04-19 NOTE — Telephone Encounter (Signed)
Pt want to know if you can prescribe medication because her  Mucus build up ,coughing when eating pt think it's allergies

## 2022-04-20 NOTE — Telephone Encounter (Signed)
Called and ldvm. Sw,cma Advised to call back directly if there are further questions.  Per Ander Purpura, She can take mucinex over the counter to help with mucus and/or flonase nasal spray as needed for allergies. If this doesn't help, she can schedule an appointment (can be virtual)

## 2022-05-25 ENCOUNTER — Ambulatory Visit: Payer: Commercial Managed Care - PPO | Admitting: Adult Health

## 2022-07-06 ENCOUNTER — Ambulatory Visit: Payer: Commercial Managed Care - PPO | Admitting: Neurology

## 2022-10-18 ENCOUNTER — Encounter: Payer: Self-pay | Admitting: Nurse Practitioner

## 2022-10-18 ENCOUNTER — Telehealth: Payer: Self-pay | Admitting: Nurse Practitioner

## 2022-10-18 ENCOUNTER — Other Ambulatory Visit (HOSPITAL_COMMUNITY)
Admission: RE | Admit: 2022-10-18 | Discharge: 2022-10-18 | Disposition: A | Discharge status: Home / Self Care | Payer: 59 | Source: Ambulatory Visit | Attending: Nurse Practitioner | Admitting: Nurse Practitioner

## 2022-10-18 ENCOUNTER — Ambulatory Visit (INDEPENDENT_AMBULATORY_CARE_PROVIDER_SITE_OTHER): Payer: 59 | Admitting: Nurse Practitioner

## 2022-10-18 VITALS — BP 132/88 | HR 77 | Temp 97.2°F | Ht 61.0 in | Wt 175.4 lb

## 2022-10-18 DIAGNOSIS — Z124 Encounter for screening for malignant neoplasm of cervix: Secondary | ICD-10-CM

## 2022-10-18 DIAGNOSIS — Z0000 Encounter for general adult medical examination without abnormal findings: Secondary | ICD-10-CM

## 2022-10-18 DIAGNOSIS — I10 Essential (primary) hypertension: Secondary | ICD-10-CM

## 2022-10-18 DIAGNOSIS — Z0001 Encounter for general adult medical examination with abnormal findings: Secondary | ICD-10-CM | POA: Diagnosis not present

## 2022-10-18 DIAGNOSIS — G4700 Insomnia, unspecified: Secondary | ICD-10-CM

## 2022-10-18 DIAGNOSIS — Z111 Encounter for screening for respiratory tuberculosis: Secondary | ICD-10-CM

## 2022-10-18 DIAGNOSIS — E785 Hyperlipidemia, unspecified: Secondary | ICD-10-CM | POA: Diagnosis not present

## 2022-10-18 DIAGNOSIS — I700 Atherosclerosis of aorta: Secondary | ICD-10-CM | POA: Diagnosis not present

## 2022-10-18 DIAGNOSIS — G8929 Other chronic pain: Secondary | ICD-10-CM

## 2022-10-18 DIAGNOSIS — M545 Low back pain, unspecified: Secondary | ICD-10-CM

## 2022-10-18 DIAGNOSIS — R7303 Prediabetes: Secondary | ICD-10-CM | POA: Diagnosis not present

## 2022-10-18 DIAGNOSIS — Z1231 Encounter for screening mammogram for malignant neoplasm of breast: Secondary | ICD-10-CM

## 2022-10-18 DIAGNOSIS — M65311 Trigger thumb, right thumb: Secondary | ICD-10-CM

## 2022-10-18 DIAGNOSIS — J4520 Mild intermittent asthma, uncomplicated: Secondary | ICD-10-CM | POA: Diagnosis not present

## 2022-10-18 LAB — COMPREHENSIVE METABOLIC PANEL
ALT: 9 U/L (ref 0–35)
AST: 10 U/L (ref 0–37)
Albumin: 4 g/dL (ref 3.5–5.2)
Alkaline Phosphatase: 78 U/L (ref 39–117)
BUN: 13 mg/dL (ref 6–23)
CO2: 26 mEq/L (ref 19–32)
Calcium: 9.7 mg/dL (ref 8.4–10.5)
Chloride: 103 mEq/L (ref 96–112)
Creatinine, Ser: 0.51 mg/dL (ref 0.40–1.20)
GFR: 105.45 mL/min (ref >60.00)
Glucose, Bld: 159 mg/dL — ABNORMAL HIGH (ref 70–99)
Potassium: 3.6 mEq/L (ref 3.5–5.1)
Sodium: 139 mEq/L (ref 135–145)
Total Bilirubin: 0.3 mg/dL (ref 0.2–1.2)
Total Protein: 6.7 g/dL (ref 6.0–8.3)

## 2022-10-18 LAB — LIPID PANEL
Cholesterol: 193 mg/dL (ref 0–200)
HDL: 41.5 mg/dL (ref >39.00)
LDL Cholesterol: 125 mg/dL — ABNORMAL HIGH (ref 0–99)
NonHDL: 151.6
Total CHOL/HDL Ratio: 5
Triglycerides: 132 mg/dL (ref 0.0–149.0)
VLDL: 26.4 mg/dL (ref 0.0–40.0)

## 2022-10-18 LAB — POCT GLYCOSYLATED HEMOGLOBIN (HGB A1C)
HbA1c POC (<> result, manual entry): 7 % (ref 4.0–5.6)
HbA1c, POC (controlled diabetic range): 7 % (ref 0.0–7.0)
HbA1c, POC (prediabetic range): 7 % — AB (ref 5.7–6.4)
Hemoglobin A1C: 7 % — AB (ref 4.0–5.6)

## 2022-10-18 LAB — TSH: TSH: 1.15 u[IU]/mL (ref 0.35–5.50)

## 2022-10-18 MED ORDER — BUDESONIDE-FORMOTEROL FUMARATE 80-4.5 MCG/ACT IN AERO
INHALATION_SPRAY | RESPIRATORY_TRACT | 11 refills | Status: DC
Start: 2022-10-18 — End: 2023-08-16

## 2022-10-18 MED ORDER — CELECOXIB 100 MG PO CAPS: 100.0000 mg | ORAL_CAPSULE | Freq: Two times a day (BID) | ORAL | 1 refills | Status: DC

## 2022-10-18 MED ORDER — DICLOFENAC SODIUM 1 % EX GEL: 2.0000 g | Freq: Four times a day (QID) | CUTANEOUS | 1 refills | Status: DC | PRN

## 2022-10-18 MED ORDER — ACETAMINOPHEN 500 MG PO TABS: 500.0000 mg | ORAL_TABLET | Freq: Four times a day (QID) | ORAL | 3 refills | Status: AC | PRN

## 2022-10-18 NOTE — Assessment & Plan Note (Signed)
Mammogram ordered today 

## 2022-10-18 NOTE — Addendum Note (Signed)
Addended by: Rodman Pickle A on: 10/18/2022 12:51 PM   Modules accepted: Orders

## 2022-10-18 NOTE — Assessment & Plan Note (Signed)
Chronic, stable.  Continue Symbicort inhaler twice a day and albuterol as needed.

## 2022-10-18 NOTE — Assessment & Plan Note (Signed)
Noted on CT scan 06/2021.  Will check lipid panel today and treat based on results.

## 2022-10-18 NOTE — Assessment & Plan Note (Signed)
Chronic, stable.  Continue trazodone 50 mg daily as needed for sleep.  She does not need a refill today

## 2022-10-18 NOTE — Assessment & Plan Note (Signed)
Check A1c and treat based on results.  

## 2022-10-18 NOTE — Assessment & Plan Note (Signed)
Health maintenance reviewed and updated. Discussed nutrition, exercise. Check CMP, CBC, TSH today. Follow-up 1 year.   

## 2022-10-18 NOTE — Assessment & Plan Note (Signed)
Will check lipid panel, CMP, CBC and treat based on results.

## 2022-10-18 NOTE — Patient Instructions (Addendum)
It was great to see you!  Your thumb most likely has trigger finger. I am placing a referral to orthopedics.   We are checking your labs today and will let you know the results via mychart/phone.   I have placed an order for screening mammogram, they should call you to schedule. If you do not hear from them in the next week, please call:  [facility] [address] [place], [address] [place], [place] [phone]  Let's follow-up in 6 months, sooner if you have concerns.  If a referral was placed today, you will be contacted for an appointment. Please note that routine referrals can sometimes take up to 3-4 weeks to process. Please call our office if you haven't heard anything after this time frame.  Take care,  Rodman Pickle, NP

## 2022-10-18 NOTE — Assessment & Plan Note (Signed)
Chronic, stable.  Continue amlodipine 10 mg daily.  Check CMP, CBC, lipid panel today.  Follow-up in 6 months.

## 2022-10-18 NOTE — Telephone Encounter (Signed)
Prescription Request  10/18/2022  LOV: 10/18/2022  What is the name of the medication or equipment? acetaminophen (TYLENOL) 500 MG tablet [[id number]]   Have you contacted your pharmacy to request a refill? Yes, she was just seen today 10/18/22 Which pharmacy would you like this sent to?   [id number] [place], [place] - [address] [place] [address] Phone: [phone] Fax: [phone]    Patient notified that their request is being sent to the clinical staff for review and that they should receive a response within 2 business days.   Please advise at [facility] [phone]

## 2022-10-18 NOTE — Telephone Encounter (Signed)
Patient notified that Rx sent into pharmacy. 

## 2022-10-18 NOTE — Assessment & Plan Note (Signed)
Chronic, stable.  Continue Tylenol 500 mg every 6 hours as needed for pain and Celebrex 100 mg twice a day..  Follow-up in 6 months or sooner if symptoms worsen.

## 2022-10-18 NOTE — Progress Notes (Signed)
BP 132/88 (BP Location: Right Arm)   Pulse 77   Temp (!) 97.2 F (36.2 C)   Ht  (1.549 m)   Wt 175 lb 6.4 oz (79.6 kg)   SpO2 98%   BMI 33.14 kg/m    Subjective:    Patient ID: [name], female    DOB: Jackelynn 26, 1967, 57 y.o.   MRN: [id number]  CC: Chief Complaint  Patient presents with   Annual Exam    With fasting labs, PPD, right thumb pain    HPI: [name] is a 57 y.o. female presenting on 10/18/2022 for comprehensive medical examination. Current medical complaints include: right thumb pain  She has been experiencing right thumb pain with a popping sensation for the last month.  She states that she also feels like it is swollen.  She denies injury and has not been taking anything over-the-counter for it except Tylenol.  Menopausal Symptoms: no  Depression and Anxiety Screen done today and results listed below:     10/18/2022    8:31 AM 10/16/2021    4:18 PM 10/16/2021    3:27 PM 08/20/2020    2:41 PM 01/28/2020    3:22 PM  Depression screen PHQ 2/9  Decreased Interest 0 0 0 0 0  Down, Depressed, Hopeless 0 0 0 0 0  PHQ - 2 Score 0 0 0 0 0  Altered sleeping Tired, decreased energy 1 0  1 2  Change in appetite 0 Feeling bad or failure about yourself  0 0  0 0  Trouble concentrating 0 Moving slowly or fidgety/restless 0 0  0 1  Suicidal thoughts 0 0  0 0  PHQ-9 Score Difficult doing work/chores Not difficult at all Not difficult at all  Not difficult at all Not difficult at all      10/18/2022    8:31 AM 10/16/2021    4:18 PM  GAD 7 : Generalized Anxiety Score  Nervous, Anxious, on Edge 0 0  Control/stop worrying 1 0  Worry too much - different things 1 1  Trouble relaxing 1 1  Restless 1 0  Easily annoyed or irritable 1 1  Afraid - awful might happen 0 0  Total GAD 7 Score 5 3  Anxiety Difficulty Not difficult at all Not difficult at all    The patient does not have a history of falls. I did not complete a  risk assessment for falls. A plan of care for falls was not documented.   Past Medical History:  Past Medical History:  Diagnosis Date   Anemia    Asthma    Bursitis of left hip    Decreased appetite    GERD (gastroesophageal reflux disease)    History of hiatal hernia    repaired   History of stomach ulcers    Hypertension    Migraine    "maybe twice in the last 10 yrs" (10/16/2013)   PONV (postoperative nausea and vomiting)    Shortness of breath dyspnea     Surgical History:  Past Surgical History:  Procedure Laterality Date   BARTHOLIN GLAND CYST EXCISION     BREAST LUMPECTOMY WITH RADIOACTIVE SEED LOCALIZATION Left 01/23/2016   Procedure: BREAST LUMPECTOMY WITH RADIOACTIVE SEED LOCALIZATION;  Surgeon: Avel Peace, MD;  Location: [facility] OR;  Service: General;  Laterality: Left;   CESAREAN SECTION  1989   CHOLECYSTECTOMY  ~ 2000   EPIGASTRIC HERNIA REPAIR  07/2003   HERNIA REPAIR     INCISION AND DRAINAGE ABSCESS  10/2005   Bartholin abscess.   JOINT REPLACEMENT     Right   TONSILLECTOMY AND ADENOIDECTOMY  1983   TOTAL HIP ARTHROPLASTY Right 06/05/2017   TOTAL HIP ARTHROPLASTY Right 06/06/2017   Procedure: RIGHT TOTAL HIP ARTHROPLASTY ANTERIOR APPROACH;  Surgeon: Samson Frederic, MD;  Location: [facility] OR;  Service: Orthopedics;  Laterality: Right;  Needs RNFA   TUBAL LIGATION  1994    Medications:  Current Outpatient Medications on File Prior to Visit  Medication Sig   amLODipine (NORVASC) 10 MG tablet Take 1 tablet (10 mg total) by mouth daily.   cetirizine (ZYRTEC) 10 MG tablet Take 10 mg by mouth daily.   albuterol (PROVENTIL HFA;VENTOLIN HFA) 108 (90 Base) MCG/ACT inhaler Inhale 2 puffs into the lungs every 6 (six) hours as needed for wheezing or shortness of breath. (Patient not taking: Reported on 10/18/2022)   albuterol (PROVENTIL) (2.5 MG/3ML) 0.083% nebulizer solution Take 3 mLs (2.5 mg total) by nebulization every 6 (six) hours as needed for wheezing or  shortness of breath. (Patient not taking: Reported on 10/18/2022)   ferrous sulfate 325 (65 FE) MG tablet Take 1 tablet (325 mg total) by mouth daily.   traZODone (DESYREL) 50 MG tablet TAKE 0.5-1 TABLETS BY MOUTH AT BEDTIME AS NEEDED FOR SLEEP. (Patient not taking: Reported on 10/18/2022)   No current facility-administered medications on file prior to visit.    Allergies:  Allergies  Allergen Reactions   Amoxicillin Rash    PATIENT HAS HAD A PCN REACTION WITH IMMEDIATE RASH, FACIAL/TONGUE/THROAT SWELLING, SOB, OR LIGHTHEADEDNESS WITH HYPOTENSION:  #  #  #  YES  #  #  #   HAS PT DEVELOPED SEVERE RASH INVOLVING MUCUS MEMBRANES or SKIN NECROSIS: #  #  #  YES  #  #  #     "On my face" Has patient had a PCN reaction that required hospitalization: No Has patient had a PCN reaction occurring within the last 10 years:  #  #  #  UNKNOWN  #  #  #  .    Ampicillin Rash    PATIENT HAS HAD A PCN REACTION WITH IMMEDIATE RASH, FACIAL/TONGUE/THROAT SWELLING, SOB, OR LIGHTHEADEDNESS WITH HYPOTENSION: # # # YES # # #  HAS PT DEVELOPED SEVERE RASH INVOLVING MUCUS MEMBRANES or SKIN NECROSIS: # # # YES # # # "On my face"e Has patient had a PCN reaction that required hospitalization: No Has patient had a PCN reaction occurring within the last 10 years:  #  #  #  UNKNOWN  #  #  #       Mobic [Meloxicam] Rash   Montelukast Sodium Rash   Phenergan [Promethazine Hcl] Nausea And Vomiting    Social History:  Social History   Socioeconomic History   Marital status: Single    Spouse name: Not on file   Number of children: Not on file   Years of education: Not on file   Highest education level: Not on file  Occupational History   Occupation: residential worker  Tobacco Use   Smoking status: Former    Years: 1    Types: Cigarettes    Quit date: 06/01/2014    Years since quitting: 8.3   Smokeless tobacco: Never  Vaping Use   Vaping Use: Never used  Substance and Sexual Activity  Alcohol use: Not  Currently    Alcohol/week: 0.0 standard drinks of alcohol   Drug use: No   Sexual activity: Yes  Other Topics Concern   Not on file  Social History Narrative   Not on file   Social Determinants of Health   Financial Resource Strain: Not on file  Food Insecurity: Not on file  Transportation Needs: Not on file  Physical Activity: Not on file  Stress: Not on file  Social Connections: Not on file  Intimate Partner Violence: Not on file   Social History   Tobacco Use  Smoking Status Former   Years: 1   Types: Cigarettes   Quit date: 06/01/2014   Years since quitting: 8.3  Smokeless Tobacco Never   Social History   Substance and Sexual Activity  Alcohol Use Not Currently   Alcohol/week: 0.0 standard drinks of alcohol    Family History:  Family History  Problem Relation Age of Onset   Cancer Mother 49       unknown   Heart attack Father 50   Heart attack Sister 4   Diabetes Sister     Past medical history, surgical history, medications, allergies, family history and social history reviewed with patient today and changes made to appropriate areas of the chart.   Review of Systems  Constitutional:  Positive for malaise/fatigue. Negative for fever.  HENT: Negative.    Eyes: Negative.   Respiratory: Negative.    Cardiovascular: Negative.   Gastrointestinal: Negative.   Genitourinary: Negative.   Musculoskeletal:  Positive for joint pain (right thumb).  Skin: Negative.   Neurological: Negative.   Psychiatric/Behavioral: Negative.     All other ROS negative except what is listed above and in the HPI.      Objective:    BP 132/88 (BP Location: Right Arm)   Pulse 77   Temp (!) 97.2 F (36.2 C)   Ht 5\' 1"  (1.549 m)   Wt 175 lb 6.4 oz (79.6 kg)   SpO2 98%   BMI 33.14 kg/m   Wt Readings from Last 3 Encounters:  10/18/22 175 lb 6.4 oz (79.6 kg)  03/19/22 175 lb (79.4 kg)  01/14/22 174 lb (78.9 kg)    Physical Exam Vitals and nursing note reviewed. Exam  conducted with a chaperone present.  Constitutional:      General: She is not in acute distress.    Appearance: Normal appearance.  HENT:     Head: Normocephalic and atraumatic.     Right Ear: Tympanic membrane, ear canal and external ear normal.     Left Ear: Tympanic membrane, ear canal and external ear normal.  Eyes:     Conjunctiva/sclera: Conjunctivae normal.  Cardiovascular:     Rate and Rhythm: Normal rate and regular rhythm.     Pulses: Normal pulses.     Heart sounds: Normal heart sounds.  Pulmonary:     Effort: Pulmonary effort is normal.     Breath sounds: Normal breath sounds.  Abdominal:     Palpations: Abdomen is soft.     Tenderness: There is no abdominal tenderness.  Genitourinary:    General: Normal vulva.     Exam position: Lithotomy position.     Labia:        Right: No rash, tenderness or lesion.        Left: No rash, tenderness or lesion.      Vagina: Normal.     Cervix: Normal.     Uterus: Normal.  Adnexa: Right adnexa normal and left adnexa normal.  Musculoskeletal:        General: Tenderness (righ thumb) present. Normal range of motion.     Cervical back: Normal range of motion and neck supple.     Right lower leg: No edema.     Left lower leg: No edema.  Lymphadenopathy:     Cervical: No cervical adenopathy.  Skin:    General: Skin is warm and dry.  Neurological:     General: No focal deficit present.     Mental Status: She is alert and oriented to person, place, and time.     Cranial Nerves: No cranial nerve deficit.     Coordination: Coordination normal.     Gait: Gait normal.  Psychiatric:        Mood and Affect: Mood normal.        Behavior: Behavior normal.        Thought Content: Thought content normal.        Judgment: Judgment normal.     Results for orders placed or performed in visit on 10/18/22  Comprehensive metabolic panel  Result Value Ref Range   Sodium 139 135 - 145 mEq/L   Potassium 3.6 3.5 - 5.1 mEq/L   Chloride  103 96 - 112 mEq/L   CO2 26 19 - 32 mEq/L   Glucose, Bld 159 (H) 70 - 99 mg/dL   BUN 13 6 - 23 mg/dL   Creatinine, Ser 1.61 0.40 - 1.20 mg/dL   Total Bilirubin 0.3 0.2 - 1.2 mg/dL   Alkaline Phosphatase 78 39 - 117 U/L   AST 10 0 - 37 U/L   ALT 9 0 - 35 U/L   Total Protein 6.7 6.0 - 8.3 g/dL   Albumin 4.0 3.5 - 5.2 g/dL   GFR 096.04 >54.09 mL/min   Calcium 9.7 8.4 - 10.5 mg/dL  TSH  Result Value Ref Range   TSH 1.15 0.35 - 5.50 uIU/mL  Lipid panel  Result Value Ref Range   Cholesterol 193 0 - 200 mg/dL   Triglycerides 811.9 0.0 - 149.0 mg/dL   HDL 14.78 >29.56 mg/dL   VLDL 21.3 0.0 - 08.6 mg/dL   LDL Cholesterol 578 (H) 0 - 99 mg/dL   Total CHOL/HDL Ratio 5    NonHDL 151.60   POCT glycosylated hemoglobin (Hb A1C)  Result Value Ref Range   Hemoglobin A1C 7.0 (A) 4.0 - 5.6 %   HbA1c POC (<> result, manual entry) 7.0 4.0 - 5.6 %   HbA1c, POC (prediabetic range) 7.0 (A) 5.7 - 6.4 %   HbA1c, POC (controlled diabetic range) 7.0 0.0 - 7.0 %      Assessment & Plan:   Problem List Items Addressed This Visit       Cardiovascular and Mediastinum   Hypertension    Chronic, stable.  Continue amlodipine 10 mg daily.  Check CMP, CBC, lipid panel today.  Follow-up in 6 months.      Relevant Orders   Comprehensive metabolic panel (Completed)   Lipid panel (Completed)   Aortic atherosclerosis    Noted on CT scan 06/2021.  Will check lipid panel today and treat based on results.        Respiratory   Asthma    Chronic, stable.  Continue Symbicort inhaler twice a day and albuterol as needed.      Relevant Medications   budesonide-formoterol (SYMBICORT) 80-4.5 MCG/ACT inhaler     Other   Prediabetes  Check A1c and treat based on results      Relevant Orders   POCT glycosylated hemoglobin (Hb A1C) (Completed)   Hyperlipidemia    Will check lipid panel, CMP, CBC and treat based on results.      Relevant Orders   Comprehensive metabolic panel (Completed)   Lipid panel  (Completed)   Encounter for screening mammogram for malignant neoplasm of breast    Mammogram ordered today      Relevant Orders   MM 3D SCREENING MAMMOGRAM BILATERAL BREAST   Chronic right-sided low back pain without sciatica    Chronic, stable.  Continue Tylenol 500 mg every 6 hours as needed for pain and Celebrex 100 mg twice a day..  Follow-up in 6 months or sooner if symptoms worsen.      Relevant Medications   celecoxib (CELEBREX) 100 MG capsule   Insomnia    Chronic, stable.  Continue trazodone 50 mg daily as needed for sleep.  She does not need a refill today      Routine general medical examination at a health care facility - Primary    Health maintenance reviewed and updated. Discussed nutrition, exercise. Check CMP, CBC, TSH today. Follow-up 1 year.        Relevant Orders   Comprehensive metabolic panel (Completed)   TSH (Completed)   Other Visit Diagnoses     Screening for cervical cancer       Pap with HPV done today   Relevant Orders   Cytology - PAP   Screening-pulmonary TB       Quantiferon gold today   Relevant Orders   QuantiFERON-TB Gold Plus   Trigger finger of right thumb       Symptoms consistent with trigger finger. Will place referral to orthopedics. Continue taking tylenol as needed.   Relevant Orders   Ambulatory referral to Orthopedic Surgery        Follow up plan: No follow-ups on file.   LABORATORY TESTING:  - Pap smear: pap done  IMMUNIZATIONS:   - Tdap: Tetanus vaccination status reviewed: last tetanus booster within 10 years. - Influenza: Up to date - Pneumovax: Not applicable - Prevnar: Not applicable - HPV: Not applicable - Zostavax vaccine: Refused  SCREENING: -Mammogram: Ordered today  - Colonoscopy: Up to date  - Bone Density: Not applicable   PATIENT COUNSELING:   Advised to take 1 mg of folate supplement per day if capable of pregnancy.   Sexuality: Discussed sexually transmitted diseases, partner selection, use  of condoms, avoidance of unintended pregnancy  and contraceptive alternatives.   Advised to avoid cigarette smoking.  I discussed with the patient that most people either abstain from alcohol or drink within safe limits (<=14/week and <=4 drinks/occasion for males, <=7/weeks and <= 3 drinks/occasion for females) and that the risk for alcohol disorders and other health effects rises proportionally with the number of drinks per week and how often a drinker exceeds daily limits.  Discussed cessation/primary prevention of drug use and availability of treatment for abuse.   Diet: Encouraged to adjust caloric intake to maintain  or achieve ideal body weight, to reduce intake of dietary saturated fat and total fat, to limit sodium intake by avoiding high sodium foods and not adding table salt, and to maintain adequate dietary potassium and calcium preferably from fresh fruits, vegetables, and low-fat dairy products.    stressed the importance of regular exercise  Injury prevention: Discussed safety belts, safety helmets, smoke detector, smoking near bedding or upholstery.  Dental health: Discussed importance of regular tooth brushing, flossing, and dental visits.    NEXT PREVENTATIVE PHYSICAL DUE IN 1 YEAR. No follow-ups on file.

## 2022-10-20 LAB — QUANTIFERON-TB GOLD PLUS
Mitogen-NIL: 8.95 IU/mL
NIL: 0.2 IU/mL
QuantiFERON-TB Gold Plus: NEGATIVE
TB1-NIL: <0 IU/mL
TB2-NIL: <0 IU/mL

## 2022-10-21 LAB — CYTOLOGY - PAP
Adequacy: ABSENT
Chlamydia: NEGATIVE
Comment: NEGATIVE
Comment: NEGATIVE
Comment: NORMAL
Diagnosis: NEGATIVE
High risk HPV: NEGATIVE
Neisseria Gonorrhea: NEGATIVE

## 2022-10-21 NOTE — Addendum Note (Signed)
Addended by: Rodman Pickle A on: 10/21/2022 03:14 PM   Modules accepted: Level of Service

## 2022-11-01 ENCOUNTER — Telehealth: Payer: Self-pay | Admitting: Nurse Practitioner

## 2022-11-01 ENCOUNTER — Encounter: Payer: Self-pay | Admitting: Physician Assistant

## 2022-11-01 ENCOUNTER — Ambulatory Visit (INDEPENDENT_AMBULATORY_CARE_PROVIDER_SITE_OTHER): Payer: 59 | Admitting: Physician Assistant

## 2022-11-01 DIAGNOSIS — M65311 Trigger thumb, right thumb: Secondary | ICD-10-CM | POA: Diagnosis not present

## 2022-11-01 MED ORDER — METHYLPREDNISOLONE ACETATE 40 MG/ML IJ SUSP
20.0000 mg | INTRAMUSCULAR | Status: AC | PRN
Start: 2022-11-01 — End: 2022-11-01
  Administered 2022-11-01: 20 mg

## 2022-11-01 MED ORDER — LIDOCAINE HCL 1 % IJ SOLN
1.0000 mL | INTRAMUSCULAR | Status: AC | PRN
Start: 2022-11-01 — End: 2022-11-01
  Administered 2022-11-01: 1 mL

## 2022-11-01 NOTE — Progress Notes (Signed)
   Procedure Note  Patient: [name]             Date of Birth: 02-18-1966           MRN: [id number]             Visit Date: 11/01/2022 HPI: Ms. Neville comes in today for right thumb that is triggering.  She states she has had no injuries to the thumb.  She has tried Epsom salt soaks.  She notes that the thumb gets stuck down at times and she has to manually pull up to straighten it.  This been ongoing for few weeks.  She is right-hand dominant.  States that she is not diabetic last hemoglobin A1c was 7.0.  Review of systems: Negative fevers chills.  Physical exam: Bilateral hands good range of motion fingers bilateral hands.  Right thumb actively triggering.  She has a palpable nodule at the A1 pulley that is tender to touch of the right thumb.  No other triggering fingers.   Procedures: Visit Diagnoses:  1. Trigger thumb, right thumb     Hand/UE Inj: R thumb A1 for trigger finger on 11/01/2022 11:54 AM Details: 25 G needle, volar approach Medications: 1 mL lidocaine 1 %; 20 mg methylPREDNISolone acetate 40 MG/ML Consent was given by the patient. Immediately prior to procedure a time out was called to verify the correct patient, procedure, equipment, support staff and site/side marked as required. Patient was prepped and draped in the usual sterile fashion.    Plan: Discussed injection versus surgical intervention she would rather try an injection.  She understands to wait at least 3 months between injections.  Follow-up with [place] pain persist or becomes worse.  Questions were encouraged and answered.

## 2022-11-01 NOTE — Telephone Encounter (Signed)
I spoke with patient and notified her that she will need an appointment. She said that she will call back to schedule an appointment

## 2022-11-01 NOTE — Telephone Encounter (Signed)
Pt was asked by her pharmacist if she is allergic to Mobic, she is. The pharmacist told her, she will have a clash with celecoxib (CELEBREX) 100 MG capsule [[id number]]. If her script gets changed she would like tablet form.  Please advise pt at [phone].  [id number] [place], [place] - [address], [place] [address] Phone: [phone]  Fax: [phone] DEA #: [id number]

## 2022-11-01 NOTE — Telephone Encounter (Signed)
I spoke with patient and she wants to try something else in tablet form. She said that taking the Celebrex is also causing her stomach to hurt and wants another option.

## 2022-11-12 ENCOUNTER — Telehealth: Payer: Self-pay | Admitting: Nurse Practitioner

## 2022-11-12 NOTE — Telephone Encounter (Signed)
Pt is needing a copy of her NCIR and proof that she had her tb test done with the result. She would like to come by and pick it up when completed. She needs before Tuesday morning for her job. Please advise pt when completed at [phone].

## 2022-11-12 NOTE — Telephone Encounter (Signed)
I spoke with patient and she wants to pick up a copy of her immunizations and recent blood work and a copy of her physical.She said that she can pick this up on Monday.

## 2022-11-15 NOTE — Telephone Encounter (Signed)
Pt called to pick up paperwork. Picked up from [facility] desk and advised ready to pick up in front office.

## 2022-12-11 ENCOUNTER — Other Ambulatory Visit: Payer: Self-pay | Admitting: Student

## 2022-12-11 DIAGNOSIS — I10 Essential (primary) hypertension: Secondary | ICD-10-CM

## 2022-12-20 ENCOUNTER — Other Ambulatory Visit: Payer: Self-pay | Admitting: Nurse Practitioner

## 2022-12-20 DIAGNOSIS — I10 Essential (primary) hypertension: Secondary | ICD-10-CM

## 2022-12-20 MED ORDER — AMLODIPINE BESYLATE 10 MG PO TABS
10.0000 mg | ORAL_TABLET | Freq: Every day | ORAL | 1 refills | Status: DC
Start: 2022-12-20 — End: 2023-07-14

## 2022-12-20 NOTE — Telephone Encounter (Signed)
Requesting: Amlodopine 10 mg Last Visit: 10/18/2022 Next Visit: Visit date not found Last Refill: 02/23/2022  Please Advise

## 2022-12-20 NOTE — Telephone Encounter (Signed)
Pt has expressed that her Amlodipine has run out and needs a script sent in for refill. I have let her know she may need an appt.

## 2022-12-20 NOTE — Telephone Encounter (Signed)
  [id number] [place], [place] - [address] [place] [address] Phone: [phone] Fax: [phone]

## 2022-12-20 NOTE — Telephone Encounter (Signed)
LVM to verify pharmacy.

## 2022-12-20 NOTE — Telephone Encounter (Signed)
Rx sent to pharmacy   

## 2023-02-01 ENCOUNTER — Other Ambulatory Visit: Payer: Self-pay | Admitting: Nurse Practitioner

## 2023-02-03 NOTE — Telephone Encounter (Signed)
Requesting: DICLOFENAC SODIUM 1% GEL  Last Visit: 10/18/2022 Next Visit: Visit date not found Last Refill: 10/18/2022  Please Advise

## 2023-02-09 ENCOUNTER — Other Ambulatory Visit (HOSPITAL_COMMUNITY): Payer: Self-pay

## 2023-02-09 ENCOUNTER — Telehealth: Payer: Self-pay | Admitting: Pharmacy Technician

## 2023-02-09 NOTE — Telephone Encounter (Signed)
LVM that Rx of Diclofenac Gel approved

## 2023-02-09 NOTE — Telephone Encounter (Signed)
Pharmacy Patient Advocate Encounter  Received notification from [facility] that Prior Authorization for Diclofenac Sodium 1% gel has been APPROVED from 01/10/2023 to 02/09/2024. Ran test claim, Copay is $15.00. This test claim was processed through [facility]- copay amounts may vary at other pharmacies due to pharmacy/plan contracts, or as the patient moves through the different stages of their insurance plan.   PA #/Case ID/Reference #: [id number]

## 2023-02-18 ENCOUNTER — Encounter (HOSPITAL_BASED_OUTPATIENT_CLINIC_OR_DEPARTMENT_OTHER): Payer: Self-pay | Admitting: Emergency Medicine

## 2023-02-18 ENCOUNTER — Emergency Department (HOSPITAL_BASED_OUTPATIENT_CLINIC_OR_DEPARTMENT_OTHER)
Admission: EM | Admit: 2023-02-18 | Discharge: 2023-02-18 | Disposition: A | Discharge status: Home / Self Care | Payer: BC Managed Care – PPO | Attending: Emergency Medicine | Admitting: Emergency Medicine

## 2023-02-18 DIAGNOSIS — Z79899 Other long term (current) drug therapy: Secondary | ICD-10-CM | POA: Diagnosis not present

## 2023-02-18 DIAGNOSIS — H00014 Hordeolum externum left upper eyelid: Secondary | ICD-10-CM | POA: Insufficient documentation

## 2023-02-18 DIAGNOSIS — I10 Essential (primary) hypertension: Secondary | ICD-10-CM | POA: Diagnosis not present

## 2023-02-18 MED ORDER — CEPHALEXIN 500 MG PO CAPS: 500.0000 mg | ORAL_CAPSULE | Freq: Four times a day (QID) | ORAL | 0 refills | Status: DC

## 2023-02-18 MED ORDER — CEPHALEXIN 250 MG/5ML PO SUSR: 500.0000 mg | Freq: Four times a day (QID) | ORAL | 0 refills | Status: AC

## 2023-02-18 MED ORDER — ERYTHROMYCIN 5 MG/GM OP OINT
TOPICAL_OINTMENT | Freq: Once | OPHTHALMIC | Status: AC
  Filled 2023-02-18: qty 3.5

## 2023-02-18 MED ORDER — CEPHALEXIN 250 MG PO CAPS: 500.0000 mg | ORAL_CAPSULE | Freq: Once | ORAL | Status: DC

## 2023-02-18 NOTE — ED Provider Notes (Signed)
EMERGENCY DEPARTMENT AT [facility] Provider Note   CSN: [id number] Arrival date & time: 02/18/23  1906     History Chief Complaint  Patient presents with   Eye Problem    [name] is a 57 y.o. female with h/o HTN presents to the ER for evaluation of left upper lid swelling and pain for the past 2 weeks. She reports that her eye has been having some crusting and purulent drainage. She denies any pain to her actual eye or pain with moving her eyes. She denies any visual changes or fever. She denies any injury to the area. Denies any FBS. Allergic to amoxicillin.    Eye Problem Associated symptoms: discharge   Associated symptoms: no photophobia and no redness        Home Medications Prior to Admission medications   Medication Sig Start Date End Date Taking? Authorizing Provider  acetaminophen (TYLENOL) 500 MG tablet Take 1 tablet (500 mg total) by mouth every 6 (six) hours as needed. 10/18/22   McElwee, Jake Church, NP  albuterol (PROVENTIL HFA;VENTOLIN HFA) 108 (90 Base) MCG/ACT inhaler Inhale 2 puffs into the lungs every 6 (six) hours as needed for wheezing or shortness of breath. Patient not taking: Reported on 10/18/2022 08/21/18   Arnetha Courser, MD  albuterol (PROVENTIL) (2.5 MG/3ML) 0.083% nebulizer solution Take 3 mLs (2.5 mg total) by nebulization every 6 (six) hours as needed for wheezing or shortness of breath. Patient not taking: Reported on 10/18/2022 12/17/19   Masoudi, Shawna Orleans, MD  amLODipine (NORVASC) 10 MG tablet Take 1 tablet (10 mg total) by mouth daily. 12/20/22   McElwee, Lauren A, NP  budesonide-formoterol (SYMBICORT) 80-4.5 MCG/ACT inhaler INHALE TWO PUFFS BY MOUTH INTO LUNGS TWICE DAILY 10/18/22   McElwee, Lauren A, NP  celecoxib (CELEBREX) 100 MG capsule Take 1 capsule (100 mg total) by mouth 2 (two) times daily. 10/18/22   McElwee, Lauren A, NP  cetirizine (ZYRTEC) 10 MG tablet Take 10 mg by mouth daily.    [provider]   diclofenac Sodium (VOLTAREN) 1 % GEL APPLY 2 G TOPICALLY 4 (FOUR) TIMES DAILY AS NEEDED (PAIN). 02/03/23   Worthy Rancher B, FNP  ferrous sulfate 325 (65 FE) MG tablet Take 1 tablet (325 mg total) by mouth daily. 08/11/16 10/16/21  Eulah Pont, MD  traZODone (DESYREL) 50 MG tablet TAKE 0.5-1 TABLETS BY MOUTH AT BEDTIME AS NEEDED FOR SLEEP. Patient not taking: Reported on 10/18/2022 03/19/22   Gerre Scull, NP      Allergies    Amoxicillin, Ampicillin, Mobic [meloxicam], Montelukast sodium, and Phenergan [promethazine hcl]    Review of Systems   Review of Systems  Constitutional:  Negative for chills and fever.  HENT:  Negative for congestion and rhinorrhea.   Eyes:  Positive for discharge. Negative for photophobia, pain, redness and visual disturbance.       Reports eye lid swelling.     Physical Exam Updated Vital Signs BP (!) 152/100 (BP Location: Right Arm)   Pulse 85   Temp 98.5 F (36.9 C) (Oral)   Resp 18   Ht 5\' 1"  (1.549 m)   Wt 79.4 kg   SpO2 100%   BMI 33.07 kg/m  Physical Exam Vitals and nursing note reviewed.  Constitutional:      General: She is not in acute distress.    Appearance: She is not ill-appearing or toxic-appearing.  HENT:     Nose: Nose normal.     Mouth/Throat:  Pharynx: Oropharynx is clear.  Eyes:     General: No scleral icterus.    Extraocular Movements: Extraocular movements intact.     Conjunctiva/sclera:     Right eye: Right conjunctiva is not injected. No chemosis, exudate or hemorrhage.    Left eye: Left conjunctiva is not injected. No chemosis, exudate or hemorrhage.    Pupils: Pupils are equal, round, and reactive to light.      Comments: Well demarcated swelling noted to the area. No overlying warmth. Minimal erythema. At the marked area above, there is a clogged new appearing gland seen.  Just internal to this and the more internal aspect of the lid, there is a punctate area of drainage where the swelling is as well.  She does not  have any pain with movement of her eyes.  PERRLA.  EOMI.  I do not appreciate any significant periorbital swelling or swelling to her lower lash line.  She does have some purulent drainage in her lashes.  I do not see any hypopyon or hyphema.  Cardiovascular:     Rate and Rhythm: Normal rate.  Pulmonary:     Effort: Pulmonary effort is normal. No respiratory distress.  Skin:    General: Skin is warm and dry.  Neurological:     General: No focal deficit present.     Mental Status: She is alert.     ED Results / Procedures / Treatments   Labs (all labs ordered are listed, but only abnormal results are displayed) Labs Reviewed - No data to display  EKG None  Radiology No results found.  Procedures Procedures   Medications Ordered in ED Medications - No data to display  ED Course/ Medical Decision Making/ A&P   }                              Medical Decision Making Risk Prescription drug management.   57 y.o. female presents to the ER today for evaluation of left eye lid pain/swelling. Differential diagnosis includes but is not limited to orbital cellulitis, chalazion, hordeolum, mass. Vital signs show elevated BP, otherwise unremarkable. Physical exam as noted above.   Physical exam is consistent with internal stye. She hs a small blocked meal being gland visible in the water line of the upper lid.  Just internal to that on the internal aspect of the upper lid, she does have a draining gland from her stye.  There is a localized swelling, not diffuse swelling.  No extreme overlying warmth or erythema.  She is able to move her eyes without increased pain.  She has no tenderness to the medial lateral canthus.  She denies any visual impairment or any blurry vision.  No fevers.  At this time, she is able to open and close her eyes however she does have some swelling to the upper lid.  I will lower suspicion for any orbital cellulitis.  I do not think patient needs a fluorescein exam  as she has not had any trauma to the area or any blurry vision or pain in her eye.  She does not have a foreign body sensation.  My attending assessed at bedside and agrees with assessment and plan.  Will place her on oral antibiotics and topical antibiotics given that this is been present for the past 2 weeks.  Topical antibiotic was given in the ER for her to take home. From previous chart review, the patient has had  cephalosporins before without reaction.  Recommended her alternating between cool compresses and warm compresses to allow the expression.  We also gave her follow-up for an ophthalmologist to follow-up with.  We discussed plan at bedside. We discussed strict return precautions and red flag symptoms. The patient verbalized their understanding and agrees to the plan. The patient is stable and being discharged home in good condition.  Portions of this report may have been transcribed using voice recognition software. Every effort was made to ensure accuracy; however, inadvertent computerized transcription errors may be present.   I discussed this case with my attending physician who cosigned this note including patient's presenting symptoms, physical exam, and planned diagnostics and interventions. Attending physician stated agreement with plan or made changes to plan which were implemented.   Attending physician assessed patient at bedside.  Final Clinical Impression(s) / ED Diagnoses Final diagnoses:  Hordeolum externum of left upper eyelid    Rx / DC Orders ED Discharge Orders          Ordered    cephALEXin (KEFLEX) 500 MG capsule  4 times daily,   Status:  Discontinued        02/18/23 2144    cephALEXin (KEFLEX) 250 MG/5ML suspension  4 times daily        02/18/23 2204              Achille Rich, [place] 02/20/23 1052    Gwyneth Sprout, MD 02/20/23 2303

## 2023-02-18 NOTE — Discharge Instructions (Addendum)
You were seen in the ER today for evaluation of your eye. This is likely a stye. Please make sure you continue to rotate between warm and cool compresses throughout the days as this will help with the drainage. I am giving you a topical antibiotic to use up to 4 times a day while awake for the next 5 days.  I am also prescribing an oral antibiotic to take 4 times a day for the next 5 days.  Please take as prescribed complete the entirety of the course.  I have occluded information for an ophthalmologist into the discharge paperwork for you to follow-up with if is not improved.  If you have any concerns, new or worsening symptoms, please return to the nearest emergency room for evaluation.  Contact a health care provider if: You have chills or a fever. Your stye does not go away after several days. Your stye affects your vision. Your eyeball becomes swollen, red, or painful. Get help right away if: You have pain when moving your eye around.

## 2023-02-18 NOTE — ED Triage Notes (Signed)
Pt presents for L eye watering, crusting, swelling, painful x 2 weeks. Tried hot compresses and saline eye drops without relief.   No previous episodes. Sclera clear but obvious swelling to L eyelid. No vision changes, hard to open left eye d/t swelling.

## 2023-04-11 ENCOUNTER — Telehealth: Payer: Self-pay | Admitting: Nurse Practitioner

## 2023-04-11 ENCOUNTER — Telehealth: Payer: Self-pay

## 2023-04-11 DIAGNOSIS — Z1231 Encounter for screening mammogram for malignant neoplasm of breast: Secondary | ICD-10-CM

## 2023-04-11 NOTE — Telephone Encounter (Signed)
-----   Message from Gerre Scull sent at 04/11/2023  8:04 AM EDT ----- Please call patient and let her know. ----- Message ----- From: Melvyn Novas, MD Sent: 04/10/2023   4:25 PM EDT To: Gerre Scull, NP  Still has not made appointment for sleep study, Sleep study order is expiring soon.

## 2023-04-11 NOTE — Telephone Encounter (Signed)
I spoke with patient and she said that she does not need to have the sleep study and she had this done last year or early this year. Also patient would like to get scheduled to have a mammogram.

## 2023-04-11 NOTE — Telephone Encounter (Signed)
LVM for patient to return call. 

## 2023-04-11 NOTE — Telephone Encounter (Signed)
Pt called and said she returning your call . Please call her back

## 2023-04-12 NOTE — Telephone Encounter (Signed)
I called and spoke with patient and gave her number to call regarding sleep study. Patient would to go to [facility] off [address] for [employer].

## 2023-04-12 NOTE — Telephone Encounter (Signed)
I returned patient's call. 

## 2023-04-13 NOTE — Telephone Encounter (Signed)
I called patient and notified her of below message and she was driving and asked for me to call back and leave message on her voicemail.  Message left on patient's voicemail

## 2023-04-18 ENCOUNTER — Ambulatory Visit: Payer: BC Managed Care – PPO

## 2023-04-25 ENCOUNTER — Ambulatory Visit
Admission: RE | Admit: 2023-04-25 | Discharge: 2023-04-25 | Disposition: A | Discharge status: Home / Self Care | Payer: BC Managed Care – PPO | Source: Ambulatory Visit | Attending: Nurse Practitioner | Admitting: Nurse Practitioner

## 2023-04-25 DIAGNOSIS — Z1231 Encounter for screening mammogram for malignant neoplasm of breast: Secondary | ICD-10-CM

## 2023-04-27 ENCOUNTER — Telehealth: Payer: Self-pay | Admitting: Nurse Practitioner

## 2023-04-27 NOTE — Telephone Encounter (Signed)
04/27/23 - pt called saying she was returning a missed call from the [facility] office. She wants a call back at [phone]

## 2023-04-28 NOTE — Telephone Encounter (Signed)
Patient notified of mammogram results.

## 2023-04-28 NOTE — Telephone Encounter (Signed)
I called patient and left voicemail for patient to return call

## 2023-04-28 NOTE — Telephone Encounter (Signed)
Pt requested since you keep missing each other to please put the results on her VM. When she is at work she cannot answer.

## 2023-05-20 ENCOUNTER — Ambulatory Visit: Payer: BC Managed Care – PPO | Admitting: Nurse Practitioner

## 2023-05-20 ENCOUNTER — Telehealth: Payer: Self-pay | Admitting: Nurse Practitioner

## 2023-05-20 NOTE — Telephone Encounter (Signed)
Pt did not show up today, no letter sent. Pls advise

## 2023-05-24 NOTE — Telephone Encounter (Signed)
1st no show, sent text to reschedule

## 2023-05-25 NOTE — Telephone Encounter (Signed)
Noted  

## 2023-07-14 ENCOUNTER — Other Ambulatory Visit: Payer: Self-pay | Admitting: Nurse Practitioner

## 2023-07-14 DIAGNOSIS — I10 Essential (primary) hypertension: Secondary | ICD-10-CM

## 2023-07-14 NOTE — Telephone Encounter (Signed)
 Requesting: AMLODIPINE BESYLATE 10 MG TAB  Last Visit: 10/18/2022 Next Visit: Visit date not found Last Refill: 12/20/2022  Please Advise  Patient is due for an appointment.

## 2023-07-14 NOTE — Telephone Encounter (Signed)
 Patient needs appointment.

## 2023-08-06 ENCOUNTER — Encounter (HOSPITAL_COMMUNITY): Payer: Self-pay

## 2023-08-06 ENCOUNTER — Emergency Department (HOSPITAL_COMMUNITY)
Admission: EM | Admit: 2023-08-06 | Discharge: 2023-08-06 | Disposition: A | Discharge status: Home / Self Care | Payer: BC Managed Care – PPO | Attending: Emergency Medicine | Admitting: Emergency Medicine

## 2023-08-06 ENCOUNTER — Other Ambulatory Visit: Payer: Self-pay

## 2023-08-06 DIAGNOSIS — R1310 Dysphagia, unspecified: Secondary | ICD-10-CM | POA: Diagnosis not present

## 2023-08-06 DIAGNOSIS — Z87891 Personal history of nicotine dependence: Secondary | ICD-10-CM | POA: Diagnosis not present

## 2023-08-06 NOTE — ED Notes (Signed)
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 Last ate yest at 1pm patient is able to swallow her salvia. No acute distress.

## 2023-08-06 NOTE — ED Provider Notes (Cosign Needed)
[place] EMERGENCY DEPARTMENT AT [facility] Provider Note   CSN: [id number] Arrival date & time: 08/06/23  0848     History  Chief Complaint  Patient presents with   Dysphagia    [name] is a 58 y.o. female.  Patient reports that she has been having symptoms of reflux and feeling like food is sticking in her throat for the past 4 weeks.  Patient was told that she probably needs to have an endoscopy.  Patient is here requesting to have an endoscopy.  Patient reports that she is able to swallow her saliva she is able to swallow liquids.  Patient reports that she feels like solid food gets stuck in her throat.  Patient denies any nausea vomiting or diarrhea.  Patient reports when something gets hung in her throat she has to make herself vomit.  The history is provided by the patient.       Home Medications Prior to Admission medications   Medication Sig Start Date End Date Taking? Authorizing Provider  acetaminophen (TYLENOL) 500 MG tablet Take 1 tablet (500 mg total) by mouth every 6 (six) hours as needed. 10/18/22   McElwee, Jake Church, NP  albuterol (PROVENTIL HFA;VENTOLIN HFA) 108 (90 Base) MCG/ACT inhaler Inhale 2 puffs into the lungs every 6 (six) hours as needed for wheezing or shortness of breath. Patient not taking: Reported on 10/18/2022 08/21/18   Arnetha Courser, MD  albuterol (PROVENTIL) (2.5 MG/3ML) 0.083% nebulizer solution Take 3 mLs (2.5 mg total) by nebulization every 6 (six) hours as needed for wheezing or shortness of breath. Patient not taking: Reported on 10/18/2022 12/17/19   Masoudi, Shawna Orleans, MD  amLODipine (NORVASC) 10 MG tablet TAKE 1 TABLET BY MOUTH EVERY DAY 07/14/23   McElwee, Lauren A, NP  budesonide-formoterol (SYMBICORT) 80-4.5 MCG/ACT inhaler INHALE TWO PUFFS BY MOUTH INTO LUNGS TWICE DAILY 10/18/22   McElwee, Lauren A, NP  celecoxib (CELEBREX) 100 MG capsule Take 1 capsule (100 mg total) by mouth 2 (two) times daily. 10/18/22   McElwee,  Lauren A, NP  cetirizine (ZYRTEC) 10 MG tablet Take 10 mg by mouth daily.    [provider]  diclofenac Sodium (VOLTAREN) 1 % GEL APPLY 2 G TOPICALLY 4 (FOUR) TIMES DAILY AS NEEDED (PAIN). 02/03/23   Worthy Rancher B, FNP  ferrous sulfate 325 (65 FE) MG tablet Take 1 tablet (325 mg total) by mouth daily. 08/11/16 10/16/21  Eulah Pont, MD  traZODone (DESYREL) 50 MG tablet TAKE 0.5-1 TABLETS BY MOUTH AT BEDTIME AS NEEDED FOR SLEEP. Patient not taking: Reported on 10/18/2022 03/19/22   Gerre Scull, NP      Allergies    Amoxicillin, Ampicillin, Mobic [meloxicam], Montelukast sodium, and Phenergan [promethazine hcl]    Review of Systems   Review of Systems  HENT:  Positive for trouble swallowing.   All other systems reviewed and are negative.   Physical Exam Updated Vital Signs BP (!) 171/93   Pulse 84   Temp 98.2 F (36.8 C)   Resp 16   Ht 5\' 1"  (1.549 m)   Wt 74.8 kg   LMP 11/11/2019   SpO2 100%   BMI 31.18 kg/m  Physical Exam Vitals and nursing note reviewed.  Constitutional:      Appearance: She is well-developed.  HENT:     Head: Normocephalic.  Cardiovascular:     Rate and Rhythm: Normal rate.  Pulmonary:     Effort: Pulmonary effort is normal.  Abdominal:  General: There is no distension.  Skin:    General: Skin is warm.  Neurological:     General: No focal deficit present.     Mental Status: She is alert.     ED Results / Procedures / Treatments   Labs (all labs ordered are listed, but only abnormal results are displayed) Labs Reviewed - No data to display  EKG None  Radiology No results found.  Procedures Procedures    Medications Ordered in ED Medications - No data to display  ED Course/ Medical Decision Making/ A&P                                 Medical Decision Making Patient complains of difficulty swallowing for the past 4 weeks.  She states when she eats she feels like food gets stuck.  Patient states she has not taken any  medications for reflux.  She reports she does not have a GI physician.  She has scheduled to see her primary care doctor  Risk Risk Details: Patient wants to have an endoscopy today.  She states that she has not ate anything since midnight last night.  I advised patient that I could refer her to GI to be evaluated.  Patient refused referral.  She states that she will go to [facility] where she can get it done today.            Final Clinical Impression(s) / ED Diagnoses Final diagnoses:  Dysphagia, unspecified type    Rx / DC Orders ED Discharge Orders     None      Patient left without receiving referral or discharge paperwork.   Elson Areas, [place] 08/06/23 1109

## 2023-08-06 NOTE — ED Triage Notes (Signed)
Pt c.o dysphagia for the past month. 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 Pt states food gets stuck in her chest and she has to induce vomiting.

## 2023-08-06 NOTE — ED Notes (Signed)
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

## 2023-08-08 ENCOUNTER — Telehealth: Payer: Self-pay

## 2023-08-08 NOTE — Transitions of Care (Post Inpatient/ED Visit) (Signed)
   08/08/2023  Name: [name] MRN: [id number] DOB: [birth year]  Today's TOC FU Call Status: Today's TOC FU Call Status:: Unsuccessful Call (1st Attempt) Unsuccessful Call (1st Attempt) Date: 08/08/23  Attempted to reach the patient regarding the most recent Inpatient/ED visit.  Follow Up Plan: Additional outreach attempts will be made to reach the patient to complete the Transitions of Care (Post Inpatient/ED visit) call.   Signature Jodelle Green, RMA

## 2023-08-09 NOTE — Transitions of Care (Post Inpatient/ED Visit) (Signed)
   08/09/2023  Name: [name] MRN: [id number] DOB: 30-Dec-1965  Today's TOC FU Call Status: Today's TOC FU Call Status:: Unsuccessful Call (2nd Attempt) Unsuccessful Call (1st Attempt) Date: 08/08/23 Unsuccessful Call (2nd Attempt) Date: 08/09/23  Attempted to reach the patient regarding the most recent Inpatient/ED visit.  Follow Up Plan: Additional outreach attempts will be made to reach the patient to complete the Transitions of Care (Post Inpatient/ED visit) call.   Signature Ulanda Prose, RMA

## 2023-08-09 NOTE — Addendum Note (Signed)
Addended by: Leroy Kennedy on: 08/09/2023 04:05 PM   Modules accepted: Orders

## 2023-08-09 NOTE — Transitions of Care (Post Inpatient/ED Visit) (Signed)
 08/09/2023  Name: [name] MRN: [id number] DOB: 09-26-1965  Today's TOC FU Call Status: Today's TOC FU Call Status:: Successful TOC FU Call Completed Unsuccessful Call (1st Attempt) Date: 08/07/22 Unsuccessful Call (2nd Attempt) Date: 08/09/23 [facility] FU Call Complete Date: 08/09/23 Patient's Name and Date of Birth confirmed.  Transition Care Management Follow-up Telephone Call Date of Discharge: 08/06/23 Discharge Facility: Other ([facility]) Name of Other ([facility]) Discharge Facility: [facility] Type of Discharge: Emergency Department Reason for ED Visit: Other: (dysphagia) How have you been since you were released from the hospital?: Worse Any questions or concerns?: Yes Patient Questions/Concerns:: patient is unable to swallow or eat properly Patient Questions/Concerns Addressed: Provided Patient Educational Materials  Items Reviewed: Did you receive and understand the discharge instructions provided?: Yes Medications obtained,verified, and reconciled?: Yes (Medications Reviewed) Any new allergies since your discharge?: No Dietary orders reviewed?: No Do you have support at home?: No  Medications Reviewed Today: Medications Reviewed Today     Reviewed by Eugenie Ulanda CROME, CMA (Certified Medical Assistant) on 08/09/23 at 1604  Med List Status: <None>   Medication Order Taking? Sig Documenting Provider Last Dose Status Informant  acetaminophen  (TYLENOL ) 500 MG tablet [id number] Yes Take 1 tablet (500 mg total) by mouth every 6 (six) hours as needed. McElwee, Lauren A, NP Taking Active   albuterol  (PROVENTIL  HFA;VENTOLIN  HFA) 108 (90 Base) MCG/ACT inhaler [id number] Yes Inhale 2 puffs into the lungs every 6 (six) hours as needed for wheezing or shortness of breath. Caleen Qualia, MD Taking Active Self  albuterol  (PROVENTIL ) (2.5 MG/3ML) 0.083% nebulizer solution [id number] Yes Take 3 mLs (2.5 mg total) by nebulization every 6 (six) hours as needed for wheezing or  shortness of breath. Masoudi, Kelli, MD Taking Active Self  amLODipine  (NORVASC ) 10 MG tablet [id number] Yes TAKE 1 TABLET BY MOUTH EVERY DAY McElwee, Lauren A, NP Taking Active   budesonide -formoterol  (SYMBICORT ) 80-4.5 MCG/ACT inhaler [id number] Yes INHALE TWO PUFFS BY MOUTH INTO LUNGS TWICE DAILY McElwee, Lauren A, NP Taking Active   celecoxib  (CELEBREX ) 100 MG capsule [id number] Yes Take 1 capsule (100 mg total) by mouth 2 (two) times daily. McElwee, Lauren A, NP Taking Active   cetirizine (ZYRTEC) 10 MG tablet [id number] Yes Take 10 mg by mouth daily. [provider] Taking Active Self           Med Note TED BUTLER BIRCH   Fri Feb 02, 2019 11:43 PM)    diclofenac  Sodium (VOLTAREN ) 1 % GEL [id number] Yes APPLY 2 G TOPICALLY 4 (FOUR) TIMES DAILY AS NEEDED (PAIN). Douglass Caul B, FNP Taking Active   ferrous sulfate  325 (65 FE) MG tablet [id number]  Take 1 tablet (325 mg total) by mouth daily. Feliberto Cambric, MD  Expired 10/16/21 2359 Self           Med Note DONNAJEAN IHA P   Mon Jun 08, 2021  1:51 PM)    omeprazole  (PRILOSEC) 20 MG capsule [id number] Yes Take by mouth. [provider] Taking Active   traZODone  (DESYREL ) 50 MG tablet [id number] Yes TAKE 0.5-1 TABLETS BY MOUTH AT BEDTIME AS NEEDED FOR SLEEP. Nedra Tinnie LABOR, NP Taking Active             Home Care and Equipment/Supplies: Were Home Health Services Ordered?: No Any new equipment or medical supplies ordered?: No  Functional Questionnaire: Do you need assistance with bathing/showering or dressing?: No Do you need assistance with meal preparation?: No Do you need assistance with eating?: Yes Do  you have difficulty maintaining continence: No Do you need assistance with getting out of bed/getting out of a chair/moving?: No Do you have difficulty managing or taking your medications?: No  Follow up appointments reviewed: PCP Follow-up appointment confirmed?: NA Specialist Hospital Follow-up appointment  confirmed?: No Reason Specialist Follow-Up Not Confirmed: Appointment Sceduled by [facility] Calling Clinician Do you need transportation to your follow-up appointment?: No Do you understand care options if your condition(s) worsen?: Yes-patient verbalized understanding    SIGNATURE Ulanda Eugenie KRAFT

## 2023-08-12 ENCOUNTER — Telehealth: Payer: Self-pay

## 2023-08-12 NOTE — Telephone Encounter (Signed)
 Copied from CRM [id number]. Topic: General - Other >> Aug 12, 2023 12:43 PM [name] wrote: Reason for CRM: patient is needing to be seen as soon as possible she is choking on water and food constantly she has been waiting on the dr office to call her back about a appt she said she spoke with the nurse and also is waiting on the ent dr to reach out as well she would like a call back today  Returned patient phone call to schedule ED F/U left VM for callback to schedule.

## 2023-08-14 ENCOUNTER — Other Ambulatory Visit: Payer: Self-pay | Admitting: Nurse Practitioner

## 2023-08-14 DIAGNOSIS — I10 Essential (primary) hypertension: Secondary | ICD-10-CM

## 2023-08-16 ENCOUNTER — Encounter: Payer: Self-pay | Admitting: Internal Medicine

## 2023-08-16 ENCOUNTER — Encounter: Payer: Self-pay | Admitting: Gastroenterology

## 2023-08-16 ENCOUNTER — Ambulatory Visit: Payer: BC Managed Care – PPO | Admitting: Internal Medicine

## 2023-08-16 VITALS — BP 130/80 | HR 84 | Temp 97.9°F | Ht 61.0 in | Wt 171.8 lb

## 2023-08-16 DIAGNOSIS — J4520 Mild intermittent asthma, uncomplicated: Secondary | ICD-10-CM

## 2023-08-16 DIAGNOSIS — R1310 Dysphagia, unspecified: Secondary | ICD-10-CM | POA: Diagnosis not present

## 2023-08-16 DIAGNOSIS — J4521 Mild intermittent asthma with (acute) exacerbation: Secondary | ICD-10-CM

## 2023-08-16 MED ORDER — ALBUTEROL SULFATE HFA 108 (90 BASE) MCG/ACT IN AERS
2.0000 | INHALATION_SPRAY | Freq: Four times a day (QID) | RESPIRATORY_TRACT | 2 refills | Status: AC | PRN
Start: 2023-08-16 — End: ?

## 2023-08-16 MED ORDER — OMEPRAZOLE 20 MG PO CPDR
20.0000 mg | DELAYED_RELEASE_CAPSULE | Freq: Every day | ORAL | 1 refills | Status: DC
Start: 2023-08-16 — End: 2023-08-24

## 2023-08-16 MED ORDER — BUDESONIDE-FORMOTEROL FUMARATE 80-4.5 MCG/ACT IN AERO
INHALATION_SPRAY | RESPIRATORY_TRACT | 3 refills | Status: AC
Start: 2023-08-16 — End: ?

## 2023-08-16 NOTE — Telephone Encounter (Signed)
Requesting: AMLODIPINE BESYLATE 10 MG TAB  Last Visit: 10/18/2022 Next Visit: 09/15/2023 Last Refill: 07/14/2023  Please Advise

## 2023-08-16 NOTE — Progress Notes (Unsigned)
[facility] [address] [place] [address] Dept: [phone] Dept Fax: [phone]  Acute Care Office Visit  Subjective:   [name] March 03, 1966 08/16/2023  Chief Complaint  Patient presents with   Dysphagia    Difficult with swallowing    Hospitalization Follow-up    HPI: Discussed the use of AI scribe software for clinical note transcription with the patient, who gave verbal consent to proceed.  History of Present Illness   The patient, with a history of difficulty swallowing, presents with worsening symptoms over the past four weeks. She describes a sensation of food getting stuck in her throat and chest, leading to episodes of vomiting. The patient reports that this occurs with all types of food and even liquids, including water, juice, and soda. The patient has had to resort to a liquid diet due to the severity of her symptoms. She also reports a burning sensation in her chest. The patient has not yet had an endoscopy to investigate the cause of her symptoms.The patient has been prescribed omeprazole but prefers to use baking soda and Tums, which she reports are not helpful. The patient also mentions having to throw up food from previous meals and experiencing stomach pain whether she eats or not.       The following portions of the patient's history were reviewed and updated as appropriate: past medical history, past surgical history, family history, social history, allergies, medications, and problem list.   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   Non-restorative sleep 01/14/2022   Episodic circadian rhythm sleep disorder, shift work type 01/14/2022   At risk for obstructive sleep apnea 01/14/2022   Snoring 11/18/2021    Insomnia 10/17/2021   Aortic atherosclerosis (HCC) 06/14/2021   Dizziness 06/14/2021   Chronic right-sided low back pain without sciatica 08/20/2020   Adjustment disorder with disturbance of conduct    Depression, major, single episode, complete remission (HCC) 02/03/2019   Radiculopathy affecting upper extremity 01/17/2019   Asthma 02/20/2018   Osteoarthritis of right hip 12/23/2016   GERD (gastroesophageal reflux disease) 03/08/2015   Encounter for screening mammogram for malignant neoplasm of breast 03/08/2015   History of cholecystectomy 03/08/2015   Prediabetes 08/05/2014   Hyperlipidemia 08/05/2014   Hypertension 08/04/2014   Iron deficiency anemia 08/04/2014   Class 1 obesity 08/04/2014   History of diverticulitis 10/16/2013   Past Medical History:  Diagnosis Date   Anemia    Asthma    Bursitis of left hip    Decreased appetite    GERD (gastroesophageal reflux disease)    History of hiatal hernia    repaired   History of stomach ulcers    Hypertension    Migraine    "maybe twice in the last 10 yrs" (10/16/2013)   PONV (postoperative nausea and vomiting)    Shortness of breath dyspnea    Past Surgical History:  Procedure Laterality Date   BARTHOLIN GLAND CYST EXCISION     BREAST LUMPECTOMY WITH RADIOACTIVE SEED LOCALIZATION Left 01/23/2016   Procedure: BREAST LUMPECTOMY WITH RADIOACTIVE SEED LOCALIZATION;  Surgeon: Avel Peace, MD;  Location: [facility] OR;  Service: General;  Laterality: Left;   CESAREAN SECTION  1989   CHOLECYSTECTOMY  ~ 2000   EPIGASTRIC HERNIA REPAIR  07/2003   HERNIA REPAIR  INCISION AND DRAINAGE ABSCESS  10/2005   Bartholin abscess.   JOINT REPLACEMENT     Right   TONSILLECTOMY AND ADENOIDECTOMY  1983   TOTAL HIP ARTHROPLASTY Right 06/05/2017   TOTAL HIP ARTHROPLASTY Right 06/06/2017   Procedure: RIGHT TOTAL HIP ARTHROPLASTY ANTERIOR APPROACH;  Surgeon: Samson Frederic, MD;  Location: [facility] OR;  Service: Orthopedics;  Laterality: Right;   Needs RNFA   TUBAL LIGATION  1994   Family History  Problem Relation Age of Onset   Cancer Mother 24       unknown   Heart attack Father 67   Heart attack Sister 32   Diabetes Sister     Current Outpatient Medications:    acetaminophen (TYLENOL) 500 MG tablet, Take 1 tablet (500 mg total) by mouth every 6 (six) hours as needed., Disp: 30 tablet, Rfl: 3   albuterol (PROVENTIL) (2.5 MG/3ML) 0.083% nebulizer solution, Take 3 mLs (2.5 mg total) by nebulization every 6 (six) hours as needed for wheezing or shortness of breath., Disp: 75 mL, Rfl: 2   cetirizine (ZYRTEC) 10 MG tablet, Take 10 mg by mouth daily., Disp: , Rfl:    diclofenac Sodium (VOLTAREN) 1 % GEL, APPLY 2 G TOPICALLY 4 (FOUR) TIMES DAILY AS NEEDED (PAIN)., Disp: 300 g, Rfl: 1   traZODone (DESYREL) 50 MG tablet, TAKE 0.5-1 TABLETS BY MOUTH AT BEDTIME AS NEEDED FOR SLEEP., Disp: 90 tablet, Rfl: 1   albuterol (VENTOLIN HFA) 108 (90 Base) MCG/ACT inhaler, Inhale 2 puffs into the lungs every 6 (six) hours as needed for wheezing or shortness of breath., Disp: 1 each, Rfl: 2   amLODipine (NORVASC) 10 MG tablet, TAKE 1 TABLET BY MOUTH EVERY DAY, Disp: 30 tablet, Rfl: 1   budesonide-formoterol (SYMBICORT) 80-4.5 MCG/ACT inhaler, INHALE TWO PUFFS BY MOUTH INTO LUNGS TWICE DAILY, Disp: 1 each, Rfl: 3   celecoxib (CELEBREX) 100 MG capsule, Take 1 capsule (100 mg total) by mouth 2 (two) times daily. (Patient not taking: Reported on 08/16/2023), Disp: 180 capsule, Rfl: 1   ferrous sulfate 325 (65 FE) MG tablet, Take 1 tablet (325 mg total) by mouth daily., Disp: 30 tablet, Rfl: 3   omeprazole (PRILOSEC) 20 MG capsule, Take 1 capsule (20 mg total) by mouth daily., Disp: 30 capsule, Rfl: 1 Allergies  Allergen Reactions   Amoxicillin Rash    PATIENT HAS HAD A PCN REACTION WITH IMMEDIATE RASH, FACIAL/TONGUE/THROAT SWELLING, SOB, OR LIGHTHEADEDNESS WITH HYPOTENSION:  #  #  #  YES  #  #  #   HAS PT DEVELOPED SEVERE RASH INVOLVING MUCUS MEMBRANES or  SKIN NECROSIS: #  #  #  YES  #  #  #     "On my face" Has patient had a PCN reaction that required hospitalization: No Has patient had a PCN reaction occurring within the last 10 years:  #  #  #  UNKNOWN  #  #  #  .    Montelukast Hives   Ampicillin Rash    PATIENT HAS HAD A PCN REACTION WITH IMMEDIATE RASH, FACIAL/TONGUE/THROAT SWELLING, SOB, OR LIGHTHEADEDNESS WITH HYPOTENSION: # # # YES # # #  HAS PT DEVELOPED SEVERE RASH INVOLVING MUCUS MEMBRANES or SKIN NECROSIS: # # # YES # # # "On my face"e Has patient had a PCN reaction that required hospitalization: No Has patient had a PCN reaction occurring within the last 10 years:  #  #  #  UNKNOWN  #  #  #  Mobic [Meloxicam] Rash   Montelukast Sodium Rash   Phenergan [Promethazine Hcl] Nausea And Vomiting     ROS: A complete ROS was performed with pertinent positives/negatives noted in the HPI. The remainder of the ROS are negative.    Objective:   Today's Vitals   08/16/23 1342  BP: 130/80  Pulse: 84  Temp: 97.9 F (36.6 C)  TempSrc: Temporal  SpO2: 98%  Weight: 171 lb 12.8 oz (77.9 kg)  Height: 5\' 1"  (1.549 m)    GENERAL: Well-appearing, in NAD. Well nourished.  SKIN: Pink, warm and dry. No rash, lesion, ulceration, or ecchymoses. HEENT:    HEAD: Normocephalic, non-traumatic.  EYES: Conjunctive pink without exudate.  NOSE: Septum midline w/o deformity. Nares patent, mucosa pink and non-inflamed w/o drainage. THROAT: Uvula midline. Oropharynx clear. Tonsils absent. Mucus membranes pink and moist. 0 NECK: Trachea midline. Full ROM w/o pain or tenderness. No lymphadenopathy.  RESPIRATORY: Chest wall symmetrical. Respirations even and non-labored. Breath sounds clear to auscultation bilaterally.  GI: Abdomen soft, non-tender. Normoactive bowel sounds. No rebound tenderness. No hepatomegaly or splenomegaly.  CARDIAC: S1, S2 present, regular rate and rhythm. Peripheral pulses 2+ bilaterally.  EXTREMITIES: Without  clubbing, cyanosis, or edema.  PSYCH/MENTAL STATUS: Alert, oriented x 3. Cooperative, appropriate mood and affect.    No results found for any visits on 08/16/23.    Assessment & Plan:  Assessment and Plan    Dysphagia   New onset of difficulty swallowing for the past 4 weeks, with food and liquids getting stuck in the throat and chest, leading to frequent vomiting. No prior history of this issue. CT scan reportedly showed swollen tonsils and adenoids, although she had them removed at age 9.   -Urgent referral to Gastroenterology for endoscopy.   -Consider restarting Omeprazole for potential acid reflux, although she prefers over-the-counter remedies.    Asthma   She reports running out of inhalers.   -Refill prescriptions for Cibacor and second unspecified inhaler, to be sent to [facility].          Dysphagia, unspecified type -     Ambulatory referral to Gastroenterology -     Omeprazole; Take 1 capsule (20 mg total) by mouth daily.  Dispense: 30 capsule; Refill: 1  Asthma, not well controlled, mild intermittent, with acute exacerbation -     Albuterol Sulfate HFA; Inhale 2 puffs into the lungs every 6 (six) hours as needed for wheezing or shortness of breath.  Dispense: 1 each; Refill: 2  Mild intermittent asthma without complication -     Budesonide-Formoterol Fumarate; INHALE TWO PUFFS BY MOUTH INTO LUNGS TWICE DAILY  Dispense: 1 each; Refill: 3   Meds ordered this encounter  Medications   omeprazole (PRILOSEC) 20 MG capsule    Sig: Take 1 capsule (20 mg total) by mouth daily.    Dispense:  30 capsule    Refill:  1    Supervising Provider:   Garnette Gunner [[id number]]   albuterol (VENTOLIN HFA) 108 (90 Base) MCG/ACT inhaler    Sig: Inhale 2 puffs into the lungs every 6 (six) hours as needed for wheezing or shortness of breath.    Dispense:  1 each    Refill:  2    PLS DISPENSE LEAST EXPENSIVE OPTION, PROAIR, PROVENTIL, VENTOLIN, GENERIC ALBUTEROL    Supervising Provider:    Garnette Gunner [[id number]]   budesonide-formoterol (SYMBICORT) 80-4.5 MCG/ACT inhaler    Sig: INHALE TWO PUFFS BY MOUTH INTO LUNGS TWICE DAILY    Dispense:  1 each    Refill:  3    Supervising Provider:   Garnette Gunner [[id number]]   Orders Placed This Encounter  Procedures   Ambulatory referral to Gastroenterology    Referral Priority:   Urgent    Referral Type:   Consultation    Referral Reason:   Specialty Services Required    Number of Visits Requested:   1   Lab Orders  No laboratory test(s) ordered today   No images are attached to the encounter or orders placed in the encounter.  Return for Scheduled Routine Office Visits and as needed.   Salvatore Decent, FNP

## 2023-08-18 ENCOUNTER — Encounter: Payer: Self-pay | Admitting: Gastroenterology

## 2023-08-18 ENCOUNTER — Ambulatory Visit: Payer: BC Managed Care – PPO | Admitting: Gastroenterology

## 2023-08-18 VITALS — BP 126/72 | HR 83 | Ht 61.0 in | Wt 173.0 lb

## 2023-08-18 DIAGNOSIS — R1310 Dysphagia, unspecified: Secondary | ICD-10-CM | POA: Diagnosis not present

## 2023-08-18 DIAGNOSIS — K219 Gastro-esophageal reflux disease without esophagitis: Secondary | ICD-10-CM

## 2023-08-18 MED ORDER — SUCRALFATE 1 GM/10ML PO SUSP: 1.0000 g | Freq: Four times a day (QID) | ORAL | 0 refills | Status: DC

## 2023-08-18 NOTE — Progress Notes (Signed)
Noted

## 2023-08-18 NOTE — Patient Instructions (Signed)
We have sent the following medications to your pharmacy for you to pick up at your convenience:  Carafate before meals and at bedtime  You have been scheduled for an endoscopy. Please follow written instructions given to you at your visit today.  If you use inhalers (even only as needed), please bring them with you on the day of your procedure.  If you take any of the following medications, they will need to be adjusted prior to your procedure:   DO NOT TAKE 7 DAYS PRIOR TO TEST- Trulicity (dulaglutide) Ozempic, Wegovy (semaglutide) Mounjaro (tirzepatide) Bydureon Bcise (exanatide extended release)  DO NOT TAKE 1 DAY PRIOR TO YOUR TEST Rybelsus (semaglutide) Adlyxin (lixisenatide) Victoza (liraglutide) Byetta (exanatide) __________________________________________________________________________ _______________________________________________________  If your blood pressure at your visit was 140/90 or greater, please contact your primary care physician to follow up on this.  _______________________________________________________  If you are age 2 or older, your body mass index should be between 23-30. Your Body mass index is 32.69 kg/m. If this is out of the aforementioned range listed, please consider follow up with your Primary Care Provider.  If you are age 12 or younger, your body mass index should be between 19-25. Your Body mass index is 32.69 kg/m. If this is out of the aformentioned range listed, please consider follow up with your Primary Care Provider.   ________________________________________________________  The [facility] providers would like to encourage you to use [facility] to communicate with providers for non-urgent requests or questions.  Due to long hold times on the telephone, sending your provider a message by [facility] may be a faster and more efficient way to get a response.  Please allow 48 business hours for a response.  Please remember that this is for  non-urgent requests.  _______________________________________________________  Thank you for trusting me with your gastrointestinal care!   Doug Sou, PA

## 2023-08-18 NOTE — Progress Notes (Signed)
08/18/2023 [name] [id number] 1965/12/28   HISTORY OF PRESENT ILLNESS: This is a 58 year old female who was previously patient at [facility].  Colonoscopy there in October 2020 at which time she had a 4 mm polyp removed, diverticulosis, nonbleeding internal hemorrhoids.  Polyp was actually just polypoid mucosa with lymphoid aggregate negative for adenoma.  Anyway, she is here today at the request of Salvatore Decent, FNP, for evaluation of dysphagia.  She tells me that about 4 weeks ago she started having a lot of problems with swallowing.  She is having problems with swallowing both solids and liquids.  She says that she is sometimes up all night because she cannot even swallow her saliva.  She has burning epigastric abdominal pain.  She says that she has been taking over-the-counter medication for acid reflux for years, but just recently started on omeprazole 20 mg daily.  She says that sometimes she can get grits, eggs, and soft fruit to go down, but a lot of times it feels like it gets stuck and then she has to vomit back up.  She says that she has never had any issues like this in the past.  She works in [place] and actually went to [employer] [employer] [facility] on 2/1 through the emergency department where they did a CT of the soft tissue of the neck with contrast.  That was unremarkable.  They told her that she needed to have an endoscopy.  Past Medical History:  Diagnosis Date   Anemia    Asthma    Bursitis of left hip    Decreased appetite    GERD (gastroesophageal reflux disease)    History of hiatal hernia    repaired   History of stomach ulcers    Hypertension    Migraine    "maybe twice in the last 10 yrs" (10/16/2013)   PONV (postoperative nausea and vomiting)    Shortness of breath dyspnea    Past Surgical History:  Procedure Laterality Date   BARTHOLIN GLAND CYST EXCISION     BREAST LUMPECTOMY WITH RADIOACTIVE SEED LOCALIZATION Left 01/23/2016   Procedure: BREAST LUMPECTOMY WITH  RADIOACTIVE SEED LOCALIZATION;  Surgeon: Avel Peace, MD;  Location: [facility] OR;  Service: General;  Laterality: Left;   CESAREAN SECTION  1989   CHOLECYSTECTOMY  ~ 2000   EPIGASTRIC HERNIA REPAIR  07/2003   HERNIA REPAIR     INCISION AND DRAINAGE ABSCESS  10/2005   Bartholin abscess.   JOINT REPLACEMENT     Right   TONSILLECTOMY AND ADENOIDECTOMY  1983   TOTAL HIP ARTHROPLASTY Right 06/05/2017   TOTAL HIP ARTHROPLASTY Right 06/06/2017   Procedure: RIGHT TOTAL HIP ARTHROPLASTY ANTERIOR APPROACH;  Surgeon: Samson Frederic, MD;  Location: [facility] OR;  Service: Orthopedics;  Laterality: Right;  Needs RNFA   TUBAL LIGATION  1994    reports that she quit smoking about 9 years ago. Her smoking use included cigarettes. She started smoking about 10 years ago. She has never used smokeless tobacco. She reports that she does not currently use alcohol. She reports that she does not use drugs. family history includes Cancer (age of onset: 22) in her mother; Diabetes in her sister; Heart attack (age of onset: 35) in her sister; Heart attack (age of onset: 57) in her father. Allergies  Allergen Reactions   Amoxicillin Rash    PATIENT HAS HAD A PCN REACTION WITH IMMEDIATE RASH, FACIAL/TONGUE/THROAT SWELLING, SOB, OR LIGHTHEADEDNESS WITH HYPOTENSION:  #  #  #  YES  #  #  #  HAS PT DEVELOPED SEVERE RASH INVOLVING MUCUS MEMBRANES or SKIN NECROSIS: #  #  #  YES  #  #  #     "On my face" Has patient had a PCN reaction that required hospitalization: No Has patient had a PCN reaction occurring within the last 10 years:  #  #  #  UNKNOWN  #  #  #  .    Montelukast Hives   Ampicillin Rash    PATIENT HAS HAD A PCN REACTION WITH IMMEDIATE RASH, FACIAL/TONGUE/THROAT SWELLING, SOB, OR LIGHTHEADEDNESS WITH HYPOTENSION: # # # YES # # #  HAS PT DEVELOPED SEVERE RASH INVOLVING MUCUS MEMBRANES or SKIN NECROSIS: # # # YES # # # "On my face"e Has patient had a PCN reaction that required hospitalization: No Has patient had a  PCN reaction occurring within the last 10 years:  #  #  #  UNKNOWN  #  #  #       Mobic [Meloxicam] Rash   Montelukast Sodium Rash   Phenergan [Promethazine Hcl] Nausea And Vomiting      Outpatient Encounter Medications as of 08/18/2023  Medication Sig   acetaminophen (TYLENOL) 500 MG tablet Take 1 tablet (500 mg total) by mouth every 6 (six) hours as needed.   albuterol (PROVENTIL) (2.5 MG/3ML) 0.083% nebulizer solution Take 3 mLs (2.5 mg total) by nebulization every 6 (six) hours as needed for wheezing or shortness of breath.   albuterol (VENTOLIN HFA) 108 (90 Base) MCG/ACT inhaler Inhale 2 puffs into the lungs every 6 (six) hours as needed for wheezing or shortness of breath.   amLODipine (NORVASC) 10 MG tablet TAKE 1 TABLET BY MOUTH EVERY DAY   budesonide-formoterol (SYMBICORT) 80-4.5 MCG/ACT inhaler INHALE TWO PUFFS BY MOUTH INTO LUNGS TWICE DAILY   cetirizine (ZYRTEC) 10 MG tablet Take 10 mg by mouth daily.   diclofenac Sodium (VOLTAREN) 1 % GEL APPLY 2 G TOPICALLY 4 (FOUR) TIMES DAILY AS NEEDED (PAIN).   omeprazole (PRILOSEC) 20 MG capsule Take 1 capsule (20 mg total) by mouth daily.   traZODone (DESYREL) 50 MG tablet TAKE 0.5-1 TABLETS BY MOUTH AT BEDTIME AS NEEDED FOR SLEEP.   [DISCONTINUED] celecoxib (CELEBREX) 100 MG capsule Take 1 capsule (100 mg total) by mouth 2 (two) times daily. (Patient not taking: Reported on 08/16/2023)   [DISCONTINUED] ferrous sulfate 325 (65 FE) MG tablet Take 1 tablet (325 mg total) by mouth daily.   No facility-administered encounter medications on file as of 08/18/2023.    REVIEW OF SYSTEMS  : All other systems reviewed and negative except where noted in the History of Present Illness.   PHYSICAL EXAM: BP 126/72   Pulse 83   Ht 5\' 1"  (1.549 m)   Wt 173 lb (78.5 kg)   LMP 11/11/2019   SpO2 95%   BMI 32.69 kg/m  General: Well developed female in no acute distress Head: Normocephalic and atraumatic Eyes:  Sclerae anicteric, conjunctiva  pink. Ears: Normal auditory acuity Lungs: Clear throughout to auscultation; no W/R/R. Heart: Regular rate and rhythm; no M/R/G. Musculoskeletal: Symmetrical with no gross deformities  Skin: No lesions on visible extremities Neurological: Alert oriented x 4, grossly non-focal Psychological:  Alert and cooperative. Normal mood and affect  ASSESSMENT AND PLAN: *Dysphagia: New onset dysphagia about 4 weeks ago.  Having issues swallowing both solids and liquids as well as her saliva.  Also having burning epigastric abdominal pain.  Has been taking over-the-counter acid medication for years, but just recently  started omeprazole 20 mg daily.  Says that she has never had any issues with swallowing like this in the past.  Will schedule for EGD with possible dilation with first available, Dr. Marina Goodell, next week.  Continue omeprazole 20 mg daily for now, but if he finds any significant esophagitis will likely increase PPI therapy.  The risks, benefits, and alternatives to EGD with possible dilation were discussed with the patient and she consents to proceed.  Rule out stricture versus esophagitis versus Candida as she is on some steroid inhalers.   CC:  Gerre Scull, NP CC: Salvatore Decent, FNP

## 2023-08-24 ENCOUNTER — Encounter: Payer: Self-pay | Admitting: Gastroenterology

## 2023-08-24 ENCOUNTER — Ambulatory Visit: Payer: BC Managed Care – PPO | Admitting: Gastroenterology

## 2023-08-24 VITALS — BP 158/82 | HR 86 | Temp 98.1°F | Resp 15 | Ht 61.0 in | Wt 173.0 lb

## 2023-08-24 DIAGNOSIS — K449 Diaphragmatic hernia without obstruction or gangrene: Secondary | ICD-10-CM | POA: Diagnosis not present

## 2023-08-24 DIAGNOSIS — T85521A Displacement of esophageal anti-reflux device, initial encounter: Secondary | ICD-10-CM

## 2023-08-24 DIAGNOSIS — Q399 Congenital malformation of esophagus, unspecified: Secondary | ICD-10-CM | POA: Diagnosis not present

## 2023-08-24 DIAGNOSIS — R1310 Dysphagia, unspecified: Secondary | ICD-10-CM | POA: Diagnosis not present

## 2023-08-24 DIAGNOSIS — K222 Esophageal obstruction: Secondary | ICD-10-CM | POA: Diagnosis not present

## 2023-08-24 DIAGNOSIS — K2100 Gastro-esophageal reflux disease with esophagitis, without bleeding: Secondary | ICD-10-CM

## 2023-08-24 DIAGNOSIS — K2970 Gastritis, unspecified, without bleeding: Secondary | ICD-10-CM

## 2023-08-24 DIAGNOSIS — K219 Gastro-esophageal reflux disease without esophagitis: Secondary | ICD-10-CM

## 2023-08-24 DIAGNOSIS — K208 Other esophagitis without bleeding: Secondary | ICD-10-CM | POA: Diagnosis not present

## 2023-08-24 MED ORDER — SODIUM CHLORIDE 0.9 % IV SOLN: 500.0000 mL | Freq: Once | INTRAVENOUS | Status: AC

## 2023-08-24 MED ORDER — SUCRALFATE 1 GM/10ML PO SUSP
1.0000 g | Freq: Four times a day (QID) | ORAL | 1 refills | Status: DC
Start: 2023-08-24 — End: 2024-05-25

## 2023-08-24 MED ORDER — OMEPRAZOLE 40 MG PO CPDR
40.0000 mg | DELAYED_RELEASE_CAPSULE | Freq: Two times a day (BID) | ORAL | 11 refills | Status: DC
Start: 2023-08-24 — End: 2024-05-25

## 2023-08-24 NOTE — Progress Notes (Signed)
 Vitals-Autumn  Pt's states no medical or surgical changes since previsit or office visit.

## 2023-08-24 NOTE — Patient Instructions (Signed)
Follow post Dilation Diet Omeprazole 40 mg PO BID-Open capsule and mix with applesauce-do not chew Carafate Elixir 1G PO 4x daily Awaiting pathology results Avoit Ibuprofen, Naproxen and other non-steroidal anti-inflammatory drugs Take all medicine in sitting or standing position with 8 oz water. Barium swallow to delineate anatomy better and r/o diverticulum/paraesophageal component. 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   YOU HAD AN ENDOSCOPIC PROCEDURE TODAY AT THE [facility]:   Refer to the procedure report that was given to you for any specific questions about what was found during the examination.  If the procedure report does not answer your questions, please call your gastroenterologist to clarify.  If you requested that your care partner not be given the details of your procedure findings, then the procedure report has been included in a sealed envelope for you to review at your convenience later.  YOU SHOULD EXPECT: Some feelings of bloating in the abdomen. Passage of more gas than usual.  Walking can help get rid of the air that was put into your GI tract during the procedure and reduce the bloating. If you had a lower endoscopy (such as a colonoscopy or flexible sigmoidoscopy) you may notice spotting of blood in your stool or on the toilet paper. If you underwent a bowel prep for your procedure, you may not have a normal bowel movement for a few days.  Please Note:  You might notice some irritation and congestion in your nose or some drainage.  This is from the oxygen used during your procedure.  There is no need for concern and it should clear up in a day or so.  SYMPTOMS TO REPORT IMMEDIATELY:  Following upper endoscopy (EGD)  Vomiting of blood or coffee ground material  New chest pain or pain under the shoulder blades  Painful or persistently  difficult swallowing  New shortness of breath  Fever of 100F or higher  Black, tarry-looking stools  For urgent or emergent issues, a gastroenterologist can be reached at any hour by calling [phone]. Do not use MyChart messaging for urgent concerns.    DIET:  We do recommend a small meal at first, but then you may proceed to your regular diet.  Drink plenty of fluids but you should avoid alcoholic beverages for 24 hours.  ACTIVITY:  You should plan to take it easy for the rest of today and you should NOT DRIVE or use heavy machinery until tomorrow (because of the sedation medicines used during the test).    FOLLOW UP: Our staff will call the number listed on your records the next business day following your procedure.  We will call around 7:15- 8:00 am to check on you and address any questions or concerns that you may have regarding the information given to you following your procedure. If we do not reach you, we will leave a message.     If any biopsies were taken you will be contacted by phone or by letter within the next 1-3 weeks.  Please call us at [phone] if you have not heard about the biopsies in 3 weeks.    SIGNATURES/CONFIDENTIALITY: You and/or your care partner have signed paperwork which will be entered into your electronic medical record.  These signatures attest to the fact that that the information above on your After Visit Summary has been reviewed and is understood.  Full responsibility of the confidentiality of this  discharge information lies with you and/or your care-partner.

## 2023-08-24 NOTE — Progress Notes (Signed)
08/18/2023 [name] [id number] Mar 25, 1966     HISTORY OF PRESENT ILLNESS: This is a 58 year old female who was previously patient at [facility].  Colonoscopy there in October 2020 at which time she had a 4 mm polyp removed, diverticulosis, nonbleeding internal hemorrhoids.  Polyp was actually just polypoid mucosa with lymphoid aggregate negative for adenoma.   Anyway, she is here today at the request of Salvatore Decent, FNP, for evaluation of dysphagia.  She tells me that about 4 weeks ago she started having a lot of problems with swallowing.  She is having problems with swallowing both solids and liquids.  She says that she is sometimes up all night because she cannot even swallow her saliva.  She has burning epigastric abdominal pain.  She says that she has been taking over-the-counter medication for acid reflux for years, but just recently started on omeprazole 20 mg daily.  She says that sometimes she can get grits, eggs, and soft fruit to go down, but a lot of times it feels like it gets stuck and then she has to vomit back up.  She says that she has never had any issues like this in the past.   She works in [place] and actually went to [facility] [employer] on 2/1 through the emergency department where they did a CT of the soft tissue of the neck with contrast.  That was unremarkable.  They told her that she needed to have an endoscopy.       Past Medical History:  Diagnosis Date   Anemia     Asthma     Bursitis of left hip     Decreased appetite     GERD (gastroesophageal reflux disease)     History of hiatal hernia      repaired   History of stomach ulcers     Hypertension     Migraine      "maybe twice in the last 10 yrs" (10/16/2013)   PONV (postoperative nausea and vomiting)     Shortness of breath dyspnea               Past Surgical History:  Procedure Laterality Date   BARTHOLIN GLAND CYST EXCISION       BREAST LUMPECTOMY WITH RADIOACTIVE SEED LOCALIZATION Left 01/23/2016     Procedure: BREAST LUMPECTOMY WITH RADIOACTIVE SEED LOCALIZATION;  Surgeon: Avel Peace, MD;  Location: [facility] OR;  Service: General;  Laterality: Left;   CESAREAN SECTION   1989   CHOLECYSTECTOMY   ~ 2000   EPIGASTRIC HERNIA REPAIR   07/2003   HERNIA REPAIR       INCISION AND DRAINAGE ABSCESS   10/2005    Bartholin abscess.   JOINT REPLACEMENT        Right   TONSILLECTOMY AND ADENOIDECTOMY   1983   TOTAL HIP ARTHROPLASTY Right 06/05/2017   TOTAL HIP ARTHROPLASTY Right 06/06/2017    Procedure: RIGHT TOTAL HIP ARTHROPLASTY ANTERIOR APPROACH;  Surgeon: Samson Frederic, MD;  Location: [facility] OR;  Service: Orthopedics;  Laterality: Right;  Needs RNFA   TUBAL LIGATION   1994         reports that she quit smoking about 9 years ago. Her smoking use included cigarettes. She started smoking about 10 years ago. She has never used smokeless tobacco. She reports that she does not currently use alcohol. She reports that she does not use drugs. family history includes Cancer (age of onset: 87) in her mother; Diabetes in her  sister; Heart attack (age of onset: 57) in her sister; Heart attack (age of onset: 56) in her father. Allergies       Allergies  Allergen Reactions   Amoxicillin Rash      PATIENT HAS HAD A PCN REACTION WITH IMMEDIATE RASH, FACIAL/TONGUE/THROAT SWELLING, SOB, OR LIGHTHEADEDNESS WITH HYPOTENSION:  #  #  #  YES  #  #  #   HAS PT DEVELOPED SEVERE RASH INVOLVING MUCUS MEMBRANES or SKIN NECROSIS: #  #  #  YES  #  #  #     "On my face" Has patient had a PCN reaction that required hospitalization: No Has patient had a PCN reaction occurring within the last 10 years:  #  #  #  UNKNOWN  #  #  #  .     Montelukast Hives   Ampicillin Rash      PATIENT HAS HAD A PCN REACTION WITH IMMEDIATE RASH, FACIAL/TONGUE/THROAT SWELLING, SOB, OR LIGHTHEADEDNESS WITH HYPOTENSION: # # # YES # # #  HAS PT DEVELOPED SEVERE RASH INVOLVING MUCUS MEMBRANES or SKIN NECROSIS: # # # YES # # # "On my face"e Has  patient had a PCN reaction that required hospitalization: No Has patient had a PCN reaction occurring within the last 10 years:  #  #  #  UNKNOWN  #  #  #          Mobic [Meloxicam] Rash   Montelukast Sodium Rash   Phenergan [Promethazine Hcl] Nausea And Vomiting              Outpatient Encounter Medications as of 08/18/2023  Medication Sig   acetaminophen (TYLENOL) 500 MG tablet Take 1 tablet (500 mg total) by mouth every 6 (six) hours as needed.   albuterol (PROVENTIL) (2.5 MG/3ML) 0.083% nebulizer solution Take 3 mLs (2.5 mg total) by nebulization every 6 (six) hours as needed for wheezing or shortness of breath.   albuterol (VENTOLIN HFA) 108 (90 Base) MCG/ACT inhaler Inhale 2 puffs into the lungs every 6 (six) hours as needed for wheezing or shortness of breath.   amLODipine (NORVASC) 10 MG tablet TAKE 1 TABLET BY MOUTH EVERY DAY   budesonide-formoterol (SYMBICORT) 80-4.5 MCG/ACT inhaler INHALE TWO PUFFS BY MOUTH INTO LUNGS TWICE DAILY   cetirizine (ZYRTEC) 10 MG tablet Take 10 mg by mouth daily.   diclofenac Sodium (VOLTAREN) 1 % GEL APPLY 2 G TOPICALLY 4 (FOUR) TIMES DAILY AS NEEDED (PAIN).   omeprazole (PRILOSEC) 20 MG capsule Take 1 capsule (20 mg total) by mouth daily.   traZODone (DESYREL) 50 MG tablet TAKE 0.5-1 TABLETS BY MOUTH AT BEDTIME AS NEEDED FOR SLEEP.   [DISCONTINUED] celecoxib (CELEBREX) 100 MG capsule Take 1 capsule (100 mg total) by mouth 2 (two) times daily. (Patient not taking: Reported on 08/16/2023)   [DISCONTINUED] ferrous sulfate 325 (65 FE) MG tablet Take 1 tablet (325 mg total) by mouth daily.      No facility-administered encounter medications on file as of 08/18/2023.        REVIEW OF SYSTEMS  : All other systems reviewed and negative except where noted in the History of Present Illness.     PHYSICAL EXAM: BP 126/72   Pulse 83   Ht 5\' 1"  (1.549 m)   Wt 173 lb (78.5 kg)   LMP 11/11/2019   SpO2 95%   BMI 32.69 kg/m  General: Well developed  female in no acute distress Head: Normocephalic and atraumatic Eyes:  Sclerae  anicteric, conjunctiva pink. Ears: Normal auditory acuity Lungs: Clear throughout to auscultation; no W/R/R. Heart: Regular rate and rhythm; no M/R/G. Musculoskeletal: Symmetrical with no gross deformities  Skin: No lesions on visible extremities Neurological: Alert oriented x 4, grossly non-focal Psychological:  Alert and cooperative. Normal mood and affect   ASSESSMENT AND PLAN: *Dysphagia: New onset dysphagia about 4 weeks ago.  Having issues swallowing both solids and liquids as well as her saliva.  Also having burning epigastric abdominal pain.  Has been taking over-the-counter acid medication for years, but just recently started omeprazole 20 mg daily.  Says that she has never had any issues with swallowing like this in the past.  Will schedule for EGD with possible dilation with first available, Dr. Marina Goodell, next week.  Continue omeprazole 20 mg daily for now, but if he finds any significant esophagitis will likely increase PPI therapy.  The risks, benefits, and alternatives to EGD with possible dilation were discussed with the patient and she consents to proceed.  Rule out stricture versus esophagitis versus Candida as she is on some steroid inhalers.     CC:  McElwee, Jake Church, NP CC: Salvatore Decent, FNP   Attending physician's note   I have taken history, reviewed the chart and examined the patient. I performed a substantive portion of this encounter, including complete performance of at least one of the key components, in conjunction with the APP. I agree with the Advanced Practitioner's note, impression and recommendations.    Edman Circle, MD [facility] [phone]

## 2023-08-24 NOTE — Op Note (Signed)
[facility] Patient Name: [name] Procedure Date: 08/24/2023 9:44 AM MRN: [id number] Endoscopist: Lynann Bologna , MD, [id number] Age: 58 Referring MD:  Date of Birth: 1966/05/12 Gender: Female Account #: [id number] Procedure:                Upper GI endoscopy Indications:              Dysphagia Medicines:                Monitored Anesthesia Care Procedure:                Pre-Anesthesia Assessment:                           - Prior to the procedure, a History and Physical                            was performed, and patient medications and                            allergies were reviewed. The patient's tolerance of                            previous anesthesia was also reviewed. The risks                            and benefits of the procedure and the sedation                            options and risks were discussed with the patient.                            All questions were answered, and informed consent                            was obtained. Prior Anticoagulants: The patient has                            taken no anticoagulant or antiplatelet agents. ASA                            Grade Assessment: II - A patient with mild systemic                            disease. After reviewing the risks and benefits,                            the patient was deemed in satisfactory condition to                            undergo the procedure.                           After obtaining informed consent, the endoscope was  passed under direct vision. Throughout the                            procedure, the patient's blood pressure, pulse, and                            oxygen saturations were monitored continuously. The                            Olympus Scope [id number] was introduced through the                            mouth, and advanced to the second part of duodenum.                            The upper GI endoscopy was accomplished  without                            difficulty. The patient tolerated the procedure                            well. Scope In: Scope Out: Findings:                 The lower third of the esophagus was significantly                            tortuous and foreshortened.                           LA Grade D (one or more mucosal breaks involving at                            least 75% of esophageal circumference) esophagitis                            with contact bleeding was found 30 to 33 cm from                            the incisors. Biopsies were taken with a cold                            forceps for histology.                           One benign-appearing, intrinsic moderate                            (circumferential scarring or stenosis; an endoscope                            may pass) stenosis was found 33 cm from the                            incisors at the GE junction.  This stenosis measured                            1 cm (inner diameter). The stenosis was traversed                            with a "catch". A TTS dilator was passed through                            the scope. Dilation with a 12-13.5-15 mm balloon                            dilator was performed to 13.5 mm. The dilation site                            was examined and showed mild mucosal disruption and                            moderate improvement in luminal narrowing.                           A 5 cm hiatal hernia was present extending from 33                            cm up to 38 cm (from GE junction up to                            diaphragmatic hiatus). Three 4-6 mm nonbleeding                            Cameron erosions were noted at the diaphragmatic                            hiatus. There was evidence of previous slipped                            Nissen's fundoplication.                           Localized mild inflammation characterized by                            erythema was found in the  gastric antrum. Multiple                            biopsies were taken.                           The examined duodenum was normal.                           The gastroesophageal flap valve was visualized  endoscopically and classified as Hill Grade IV (no                            fold, wide open lumen, hiatal hernia present with                            Sheria Lang erosions). Complications:            No immediate complications. Estimated Blood Loss:     Estimated blood loss was minimal. Impression:               - Highly tortuous and foreshortened distal                            esophagus with distal esophageal stricture, LA                            Grade D esophagitis, slipped Nissen's                            fundoplication.                           - Mod hiatal hernia with slipped Nissen's                            fundoplication.                           - Gastritis. Recommendation:           - Patient has a contact number available for                            emergencies. The signs and symptoms of potential                            delayed complications were discussed with the                            patient. Return to normal activities tomorrow.                            Written discharge instructions were provided to the                            patient.                           - Post dilatation diet.                           - Omeprazole 40mg  po BID #60, 11 RF. Open capsule                            and put it in applesauce. Do not chew                           -  Carafate elixir 1 g p.o. 4 times daily x 4 weeks                           - Await pathology results.                           - No ibuprofen, naproxen, or other non-steroidal                            anti-inflammatory drugs.                           - Take all medicines in sitting or standing                            position with 8 ounces of water.                            - Barium swallow to delineate anatomy better and                            r/o diverticulum/paraesophageal component.                           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                           - FU in 4-6 weeks.                           - Watch for aspiration, perforation, bleeding. Pt                            works at [employer] @ [employer]. She is well                            aware of the complications. We may eventually refer                            her to [facility] esophageal clinic.                           - The findings and recommendations were discussed                            with the patient's family. Lynann Bologna, MD 08/24/2023 10:37:56 AM This report has been signed electronically.

## 2023-08-24 NOTE — Progress Notes (Signed)
A couple minutes post procedure approx 1021, pt started coughing and sounded like she was obstructing.  Checking the airway I noticed dark red/brown fluid on towel under head.  Suctrion started and more fluid started coming out.  HOB dropped to [place]. And pt suctioned until alert enough to clear/protect airway on own.  Sedation had already been halted and O2 was on max.  Sats did drop and BS were NOT clear in procedure room or immediately in PACU.  Sats in PACU started at 97 on RA.  Dr Chales Abrahams notified and PACU nurse made aware.  Suspect she did aspirate

## 2023-08-25 ENCOUNTER — Telehealth: Payer: Self-pay

## 2023-08-25 NOTE — Telephone Encounter (Signed)
  Follow up Call-     08/24/2023    9:28 AM 08/24/2023    9:22 AM  Call back number  Post procedure Call Back phone  # [phone]   Permission to leave phone message  Yes     Patient questions:  Do you have a fever, pain , or abdominal swelling? No. Pain Score  0 *  Have you tolerated food without any problems? Yes.    Have you been able to return to your normal activities? Yes.    Do you have any questions about your discharge instructions: Diet   No. Medications  No. Follow up visit  No.  Do you have questions or concerns about your Care? No.  Actions: * If pain score is 4 or above: No action needed, pain <4.

## 2023-08-26 ENCOUNTER — Telehealth: Payer: Self-pay

## 2023-08-26 NOTE — Telephone Encounter (Signed)
Patient called in with complaint of sneezing & fever post EGD. She just "feels warm" unsure of temp. She stated she had her procedure on 08/24/23 & that on 08/25/23 she started sneezing. She has taken allergy & cold medication. Tolerating food/liquid okay, throat is a little dry. Concerned this could be r/t to procedure. Advised her to monitor symptoms for now & check temperature when she is home, and to call back over the weekend if there are any changes or feels symptoms are worsening.

## 2023-08-29 LAB — SURGICAL PATHOLOGY

## 2023-09-01 ENCOUNTER — Telehealth: Payer: Self-pay

## 2023-09-01 DIAGNOSIS — K219 Gastro-esophageal reflux disease without esophagitis: Secondary | ICD-10-CM

## 2023-09-01 DIAGNOSIS — R1310 Dysphagia, unspecified: Secondary | ICD-10-CM

## 2023-09-01 NOTE — Telephone Encounter (Signed)
 Scheduled barium and follow up appointment with [name]. LVM for patient to call back regarding this

## 2023-09-02 NOTE — Telephone Encounter (Signed)
 Patient made aware and voiced understanding. Told her to continue to monitor symptoms call us with any problems

## 2023-09-07 ENCOUNTER — Encounter: Payer: Self-pay | Admitting: Gastroenterology

## 2023-09-08 ENCOUNTER — Ambulatory Visit (HOSPITAL_COMMUNITY)
Admission: RE | Admit: 2023-09-08 | Discharge: 2023-09-08 | Disposition: A | Discharge status: Home / Self Care | Payer: BC Managed Care – PPO | Source: Ambulatory Visit | Attending: Internal Medicine | Admitting: Internal Medicine

## 2023-09-08 DIAGNOSIS — R1310 Dysphagia, unspecified: Secondary | ICD-10-CM | POA: Diagnosis not present

## 2023-09-08 DIAGNOSIS — K2100 Gastro-esophageal reflux disease with esophagitis, without bleeding: Secondary | ICD-10-CM | POA: Diagnosis not present

## 2023-09-08 DIAGNOSIS — K224 Dyskinesia of esophagus: Secondary | ICD-10-CM | POA: Diagnosis not present

## 2023-09-08 DIAGNOSIS — K219 Gastro-esophageal reflux disease without esophagitis: Secondary | ICD-10-CM | POA: Insufficient documentation

## 2023-09-08 DIAGNOSIS — K222 Esophageal obstruction: Secondary | ICD-10-CM | POA: Diagnosis not present

## 2023-09-08 DIAGNOSIS — K449 Diaphragmatic hernia without obstruction or gangrene: Secondary | ICD-10-CM | POA: Diagnosis not present

## 2023-09-12 NOTE — Telephone Encounter (Signed)
 Patient called stated she is still having trouble swallowing.

## 2023-09-12 NOTE — Telephone Encounter (Signed)
 Patient is returning your call.

## 2023-09-12 NOTE — Telephone Encounter (Signed)
 Shanda Bumps please route this to Dr. Marina Goodell, this is his patient. I am off today and not in the office. Thanks

## 2023-09-12 NOTE — Telephone Encounter (Signed)
 EGD and barium swallow report reviewed Plan: -Proceed with esophageal manometry -Repeat EGD with further dilatation March and or beginning of Giah 2025 -Continue PPIs twice a day -Can add Carafate elixir 1 g p.o. 4 times daily x 2 weeks (if insurance does not cover, can do tablets but may have to crush)  RG

## 2023-09-12 NOTE — Telephone Encounter (Signed)
 Spoke with pt. Pt stated that she is still having trouble swallowing. Pt stated that her swallowing has been the same since the last EGD with dilation on the 08/24/2023.  Pt recently had Barium Swallow test done on 09/08/2023  Please review and advise

## 2023-09-12 NOTE — Telephone Encounter (Signed)
 Left message for pt to call back

## 2023-09-13 ENCOUNTER — Other Ambulatory Visit: Payer: Self-pay

## 2023-09-13 DIAGNOSIS — R1310 Dysphagia, unspecified: Secondary | ICD-10-CM

## 2023-09-13 NOTE — Telephone Encounter (Signed)
 Pt made aware of Dr. Chales Abrahams recommendations.  Pt was ordered and scheduled for the Esophageal Manometry at [facility] on 02/01/2024 at 12:30 PM . Pt made aware. Case ID number [id number] Ambulatory referral to GI placed in Epic.  Prep instructions were sent to pt via mail.   Pt was scheduled for the EGD with dilation on 10/13/2023 at 12:30 PM in the [facility] with Dr. Chales Abrahams. Pt made aware.  Ambulatory referral to GI placed  in Epic.  Prep instructions were created and sent to pt via mail.  Pt stated that she already had the Carafate.  Pt verbalized understanding with all questions answered.

## 2023-09-15 ENCOUNTER — Encounter: Payer: Self-pay | Admitting: Nurse Practitioner

## 2023-09-15 ENCOUNTER — Ambulatory Visit: Payer: BC Managed Care – PPO | Admitting: Nurse Practitioner

## 2023-09-15 VITALS — BP 136/78 | HR 96 | Temp 97.5°F | Ht 61.0 in | Wt 175.6 lb

## 2023-09-15 DIAGNOSIS — G4730 Sleep apnea, unspecified: Secondary | ICD-10-CM | POA: Diagnosis not present

## 2023-09-15 DIAGNOSIS — J4520 Mild intermittent asthma, uncomplicated: Secondary | ICD-10-CM | POA: Diagnosis not present

## 2023-09-15 DIAGNOSIS — I10 Essential (primary) hypertension: Secondary | ICD-10-CM

## 2023-09-15 DIAGNOSIS — R1310 Dysphagia, unspecified: Secondary | ICD-10-CM | POA: Diagnosis not present

## 2023-09-15 DIAGNOSIS — Z23 Encounter for immunization: Secondary | ICD-10-CM | POA: Diagnosis not present

## 2023-09-15 DIAGNOSIS — K449 Diaphragmatic hernia without obstruction or gangrene: Secondary | ICD-10-CM

## 2023-09-15 MED ORDER — TRAZODONE HCL 50 MG PO TABS: ORAL_TABLET | ORAL | 1 refills | Status: AC

## 2023-09-15 NOTE — Progress Notes (Signed)
 Established Patient Office Visit  Subjective   Patient ID: [name], female    DOB: [birth year]  Age: 58 y.o. MRN: [id number]  Chief Complaint  Patient presents with   Hypertension    Follow up, Rx refill    HPI  Discussed the use of AI scribe software for clinical note transcription with the patient, who gave verbal consent to proceed.  History of Present Illness   The patient, with a history of asthma and hypertension, presents with a chief complaint of a hernia causing significant discomfort and swallowing difficulties. The hernia was discovered during an emergency room visit for a swallowing problem. The patient reports a burning sensation and food getting stuck, which has progressively worsened. The patient also experiences difficulty swallowing saliva and has to take time to swallow. The patient reports being out of breath, which she attributes to the hernia. The patient has been managing the discomfort with Carafate and omeprazole. She is following with GI and had an endoscopy with esophageal dilation. She states this helped for about a week. She has another dilation scheduled for next month. She states that she is being referred to [facility] for hiatal hernia surgery.        ROS See pertinent positives and negatives per HPI.    Objective:     BP 136/78 (BP Location: Left Arm, Patient Position: Sitting, Cuff Size: Normal)   Pulse 96   Temp (!) 97.5 F (36.4 C)   Ht 5\' 1"  (1.549 m)   Wt 175 lb 9.6 oz (79.7 kg)   LMP 11/11/2019   SpO2 98%   BMI 33.18 kg/m  BP Readings from Last 3 Encounters:  09/15/23 136/78  08/24/23 (!) 158/82  08/18/23 126/72   Wt Readings from Last 3 Encounters:  09/15/23 175 lb 9.6 oz (79.7 kg)  08/24/23 173 lb (78.5 kg)  08/18/23 173 lb (78.5 kg)      Physical Exam Vitals and nursing note reviewed.  Constitutional:      General: She is not in acute distress.    Appearance: Normal appearance.  HENT:     Head: Normocephalic.  Eyes:      Conjunctiva/sclera: Conjunctivae normal.  Cardiovascular:     Rate and Rhythm: Normal rate and regular rhythm.     Pulses: Normal pulses.     Heart sounds: Normal heart sounds.  Pulmonary:     Effort: Pulmonary effort is normal.     Breath sounds: Normal breath sounds.  Musculoskeletal:     Cervical back: Normal range of motion.  Skin:    General: Skin is warm.  Neurological:     General: No focal deficit present.     Mental Status: She is alert and oriented to person, place, and time.  Psychiatric:        Mood and Affect: Mood normal.        Behavior: Behavior normal.        Thought Content: Thought content normal.        Judgment: Judgment normal.    The 10-year ASCVD risk score (Arnett DK, et al., 2019) is: 7.7%    Assessment & Plan:   Problem List Items Addressed This Visit       Cardiovascular and Mediastinum   Hypertension - Primary   Blood pressure is well-controlled with medication despite occasional missed doses. Stress management aids control. Continue amlodipine 10mg  daily and monitor blood pressure regularly.         Respiratory   Asthma  Dyspnea is managed with an inhaler. There is no wheezing or chest pain, and lung auscultation is clear. Will update prevnar 20 today. Continue symbicort BID and albuterol as needed.       Relevant Orders   Pneumococcal conjugate vaccine 20-valent (Completed)   Severe sleep apnea   She refuses to use CPAP. Highly encouraged her to wear this at night since sleep apnea is severe.       Hiatal hernia   The hiatal hernia is causing significant dysphagia, burning sensation, and severe epigastric pain. The esophageal stricture is worsening, increasing dysphagia and aspiration risk. She is following with GI and had an esophageal dilation, however symptoms returned after a week. She has another esophageal dilation planned for next month. She is being referred to [facility] for hiatal hernia surgery. . Continue omeprazole and  sucralfate as prescribed.        Digestive   Dysphagia   She is following with GI and scheduled for another dilation next month.       Other Visit Diagnoses       Immunization due       Prevnar 20 given today   Relevant Orders   Pneumococcal conjugate vaccine 20-valent (Completed)       Return in about 6 months (around 03/17/2024) for CPE.    Gerre Scull, NP

## 2023-09-15 NOTE — Assessment & Plan Note (Signed)
 Blood pressure is well-controlled with medication despite occasional missed doses. Stress management aids control. Continue amlodipine 10mg  daily and monitor blood pressure regularly.

## 2023-09-15 NOTE — Patient Instructions (Signed)
 It was great to see you!  Keep your appointments with GI  We are updating your pneumonia vaccine  Let's follow-up in 6 months, sooner if you have concerns.  If a referral was placed today, you will be contacted for an appointment. Please note that routine referrals can sometimes take up to 3-4 weeks to process. Please call our office if you haven't heard anything after this time frame.  Take care,  Rodman Pickle, NP

## 2023-09-15 NOTE — Assessment & Plan Note (Signed)
 She is following with GI and scheduled for another dilation next month.

## 2023-09-15 NOTE — Assessment & Plan Note (Signed)
 The hiatal hernia is causing significant dysphagia, burning sensation, and severe epigastric pain. The esophageal stricture is worsening, increasing dysphagia and aspiration risk. She is following with GI and had an esophageal dilation, however symptoms returned after a week. She has another esophageal dilation planned for next month. She is being referred to [facility] for hiatal hernia surgery. . Continue omeprazole and sucralfate as prescribed.

## 2023-09-15 NOTE — Assessment & Plan Note (Signed)
 She refuses to use CPAP. Highly encouraged her to wear this at night since sleep apnea is severe.

## 2023-09-15 NOTE — Assessment & Plan Note (Signed)
 Dyspnea is managed with an inhaler. There is no wheezing or chest pain, and lung auscultation is clear. Will update prevnar 20 today. Continue symbicort BID and albuterol as needed.

## 2023-09-20 ENCOUNTER — Ambulatory Visit: Payer: BC Managed Care – PPO | Admitting: Internal Medicine

## 2023-09-20 ENCOUNTER — Encounter: Payer: Self-pay | Admitting: Internal Medicine

## 2023-09-20 ENCOUNTER — Ambulatory Visit (HOSPITAL_COMMUNITY)
Admission: RE | Admit: 2023-09-20 | Discharge: 2023-09-20 | Disposition: A | Discharge status: Home / Self Care | Source: Ambulatory Visit | Attending: Internal Medicine | Admitting: Internal Medicine

## 2023-09-20 ENCOUNTER — Other Ambulatory Visit (INDEPENDENT_AMBULATORY_CARE_PROVIDER_SITE_OTHER)

## 2023-09-20 VITALS — BP 118/88 | HR 86 | Ht 61.0 in | Wt 172.0 lb

## 2023-09-20 DIAGNOSIS — K449 Diaphragmatic hernia without obstruction or gangrene: Secondary | ICD-10-CM

## 2023-09-20 DIAGNOSIS — R109 Unspecified abdominal pain: Secondary | ICD-10-CM

## 2023-09-20 DIAGNOSIS — N289 Disorder of kidney and ureter, unspecified: Secondary | ICD-10-CM | POA: Diagnosis not present

## 2023-09-20 DIAGNOSIS — R1319 Other dysphagia: Secondary | ICD-10-CM

## 2023-09-20 DIAGNOSIS — T85521D Displacement of esophageal anti-reflux device, subsequent encounter: Secondary | ICD-10-CM | POA: Diagnosis not present

## 2023-09-20 DIAGNOSIS — K2100 Gastro-esophageal reflux disease with esophagitis, without bleeding: Secondary | ICD-10-CM

## 2023-09-20 DIAGNOSIS — K222 Esophageal obstruction: Secondary | ICD-10-CM

## 2023-09-20 DIAGNOSIS — K5730 Diverticulosis of large intestine without perforation or abscess without bleeding: Secondary | ICD-10-CM | POA: Diagnosis not present

## 2023-09-20 DIAGNOSIS — Z860101 Personal history of adenomatous and serrated colon polyps: Secondary | ICD-10-CM

## 2023-09-20 LAB — BASIC METABOLIC PANEL
BUN: 24 mg/dL — ABNORMAL HIGH (ref 6–23)
CO2: 27 meq/L (ref 19–32)
Calcium: 10.1 mg/dL (ref 8.4–10.5)
Chloride: 104 meq/L (ref 96–112)
Creatinine, Ser: 0.88 mg/dL (ref 0.40–1.20)
GFR: 73.76 mL/min (ref >60.00)
Glucose, Bld: 170 mg/dL — ABNORMAL HIGH (ref 70–99)
Potassium: 3.9 meq/L (ref 3.5–5.1)
Sodium: 139 meq/L (ref 135–145)

## 2023-09-20 MED ORDER — IOHEXOL 300 MG/ML  SOLN
100.0000 mL | Freq: Once | INTRAMUSCULAR | Status: AC | PRN
  Administered 2023-09-20: 100 mL via INTRAVENOUS

## 2023-09-20 NOTE — Progress Notes (Signed)
 HISTORY OF PRESENT ILLNESS:  [name] is a 58 y.o. female, patient of [name] with GERD complicated by severe esophagitis, esophageal stricture, and slipped fundoplication.  Previous [facility] patient with colonoscopy with [name] 2020.  Established with this office August 18, 2023 with complaints of dysphagia and abdominal burning pain.  Subsequently underwent upper endoscopy with [name] August 24, 2023.  She was found to have severe erosive esophagitis, and esophageal stricture which was dilated to 13.5 mm, 5 cm hiatal hernia (slipped fundoplication), and Cameron erosions.  She was placed on omeprazole 40 mg twice daily and Carafate elixir 4 times daily.  Contacted the office with complaints of September 12, 2023.  [name] recommended continuing medical therapy and keep plans for repeat upper endoscopy with dilation in [name].  She is scheduled for repeat endoscopy with dilation in [name] 2025.  She was also scheduled for manometry (not until July) for possible redo antireflux surgery.  Patient is worked onto my schedule today after contacting the on-call physician with abdominal complaints.  [name] is out of the office this week.  Patient tells me that she has epigastric discomfort.  She tells me that she is compliant with medical therapy.  She tells me that she was seen at [facility] complaining of dysphagia.  She works there.  She did have a CT neck soft tissue with contrast February 1.  This was unremarkable.  She complains that her workup cannot be expedited.  She is worried that her problems with the hernia worsened short term.  REVIEW OF SYSTEMS:  All non-GI ROS negative. Past Medical History:  Diagnosis Date   Anemia    Asthma    Bursitis of left hip    Decreased appetite    GERD (gastroesophageal reflux disease)    History of hiatal hernia    repaired   History of stomach ulcers    Hypertension    Migraine    "maybe twice in the last 10 yrs" (10/16/2013)   PONV  (postoperative nausea and vomiting)    Shortness of breath dyspnea     Past Surgical History:  Procedure Laterality Date   BARTHOLIN GLAND CYST EXCISION     BREAST LUMPECTOMY WITH RADIOACTIVE SEED LOCALIZATION Left 01/23/2016   Procedure: BREAST LUMPECTOMY WITH RADIOACTIVE SEED LOCALIZATION;  Surgeon: Avel Peace, MD;  Location: [facility] OR;  Service: General;  Laterality: Left;   CESAREAN SECTION  1989   CHOLECYSTECTOMY  ~ 2000   EPIGASTRIC HERNIA REPAIR  07/2003   HERNIA REPAIR     INCISION AND DRAINAGE ABSCESS  10/2005   Bartholin abscess.   JOINT REPLACEMENT     Right   TONSILLECTOMY AND ADENOIDECTOMY  1983   TOTAL HIP ARTHROPLASTY Right 06/05/2017   TOTAL HIP ARTHROPLASTY Right 06/06/2017   Procedure: RIGHT TOTAL HIP ARTHROPLASTY ANTERIOR APPROACH;  Surgeon: Samson Frederic, MD;  Location: [facility] OR;  Service: Orthopedics;  Laterality: Right;  Needs RNFA   TUBAL LIGATION  1994    Social History [name]  reports that she quit smoking about 9 years ago. Her smoking use included cigarettes. She started smoking about 10 years ago. She has never used smokeless tobacco. She reports that she does not currently use alcohol. She reports that she does not use drugs.  family history includes Cancer (age of onset: 29) in her mother; Diabetes in her sister; Heart attack (age of onset: 54) in her sister; Heart attack (age of onset: 77) in her father.  Allergies  Allergen Reactions   Amoxicillin Rash    PATIENT HAS HAD A PCN REACTION WITH IMMEDIATE RASH, FACIAL/TONGUE/THROAT SWELLING, SOB, OR LIGHTHEADEDNESS WITH HYPOTENSION:  #  #  #  YES  #  #  #   HAS PT DEVELOPED SEVERE RASH INVOLVING MUCUS MEMBRANES or SKIN NECROSIS: #  #  #  YES  #  #  #     "On my face" Has patient had a PCN reaction that required hospitalization: No Has patient had a PCN reaction occurring within the last 10 years:  #  #  #  UNKNOWN  #  #  #  .    Montelukast Hives   Ampicillin Rash    PATIENT HAS HAD A PCN  REACTION WITH IMMEDIATE RASH, FACIAL/TONGUE/THROAT SWELLING, SOB, OR LIGHTHEADEDNESS WITH HYPOTENSION: # # # YES # # #  HAS PT DEVELOPED SEVERE RASH INVOLVING MUCUS MEMBRANES or SKIN NECROSIS: # # # YES # # # "On my face"e Has patient had a PCN reaction that required hospitalization: No Has patient had a PCN reaction occurring within the last 10 years:  #  #  #  UNKNOWN  #  #  #       Mobic [Meloxicam] Rash   Montelukast Sodium Rash   Phenergan [Promethazine Hcl] Nausea And Vomiting       PHYSICAL EXAMINATION: Vital signs: BP 118/88   Pulse 86   Ht 5\' 1"  (1.549 m)   Wt 172 lb (78 kg)   LMP 11/11/2019   BMI 32.50 kg/m   Constitutional: generally well-appearing, no acute distress Psychiatric: alert and oriented x3, cooperative Eyes: extraocular movements intact, anicteric, conjunctiva pink Mouth: oral pharynx moist, no lesions Neck: supple no lymphadenopathy Cardiovascular: heart regular rate and rhythm, no murmur Lungs: clear to auscultation bilaterally Abdomen: soft, mild tenderness to palpation in the midportion, nondistended, no obvious ascites, no peritoneal signs, normal bowel sounds, no organomegaly Rectal: Omitted Extremities: no clubbing cyanosis, or lower extremity edema bilaterally Skin: no lesions on visible extremities Neuro: No focal deficits.  Cranial nerves intact  ASSESSMENT:  1.  GERD complicated by erosive esophagitis and peptic stricture 2.  Slipped fundoplication 3.  Abdominal discomfort likely related to the same 4.  Colonoscopy elsewhere 2020 with diminutive polyp non-adenomatous, diverticulosis, and hemorrhoids   PLAN:  1.  Reflux precautions 2.  Continue twice daily PPI 3.  Continue Carafate 4.  Keep plans with [name] for repeat EGD with dilation [name] 10, 2025. 5.  Will order contrast CT of the abdomen pelvis now to rule out other possible causes for her pain despite compliance with medical therapy 6.  Further recommendations after the  above per [name]  A total time of 45 minutes was spent preparing to see the patient, obtaining comprehensive history, performing medically appropriate physical exam, counseling and educating the patient regarding above listed issues, ordering advanced imaging study, and documenting clinical information in the health record

## 2023-09-20 NOTE — Patient Instructions (Addendum)
 Your provider has requested that you go to the basement level for lab work before leaving today. Press "B" on the elevator. The lab is located at the first door on the left as you exit the elevator.  You have been scheduled for a CT scan of the abdomen and pelvis at [facility], go in through the emergency department. You are scheduled on 09/20/2023 at 6:00am. You should arrive 15 minutes prior to your appointment time for registration. :    You may take any medications as prescribed with a small amount of water, if necessary. If you take any of the following medications: METFORMIN, GLUCOPHAGE, GLUCOVANCE, AVANDAMET, RIOMET, FORTAMET, ACTOPLUS MET, JANUMET, GLUMETZA or METAGLIP, you MAY be asked to HOLD this medication 48 hours AFTER the exam.    If you have any questions regarding your exam or if you need to reschedule, you may call [facility] at [phone] between the hours of 8:00 am and 5:00 pm, Monday-Friday.

## 2023-09-21 ENCOUNTER — Telehealth: Payer: Self-pay

## 2023-09-21 ENCOUNTER — Telehealth: Payer: Self-pay | Admitting: Internal Medicine

## 2023-09-21 NOTE — Telephone Encounter (Signed)
 Copied from CRM [id number]. Topic: Clinical - Prescription Issue >> Sep 20, 2023  5:36 PM [name] wrote: Reason for CRM: Pt states her omeprazole (PRILOSEC) 40 MG capsule was changed to 20 mg, Original script is Dose: 40 mg, Route: Oral, Frequency: 2 times daily. Please call pt at  [phone]

## 2023-09-21 NOTE — Telephone Encounter (Signed)
 Inbound call from patient states she has spoken with [facility] and they advised her to have our provider fax over a referral for monoetry at [phone].   Please further advise. Thank you

## 2023-09-21 NOTE — Telephone Encounter (Signed)
 Inbound call from patient stating she believed she was told manometry would be scheduled for 6/5 instead of 7/30 with Dr. Chales Abrahams. Patient is requesting a call to discuss further. Please advise, thank you.

## 2023-09-21 NOTE — Telephone Encounter (Signed)
 Forwarding message below

## 2023-09-21 NOTE — Telephone Encounter (Signed)
 Date of 02/01/2024 was confirmed  Pt verbalized understanding with all questions answered.

## 2023-09-21 NOTE — Telephone Encounter (Signed)
 Left message for pt to call back

## 2023-09-22 NOTE — Telephone Encounter (Signed)
 Order faxed to [facility] as requested.

## 2023-09-26 ENCOUNTER — Telehealth: Payer: Self-pay | Admitting: Internal Medicine

## 2023-09-26 NOTE — Telephone Encounter (Signed)
 Patient called and stated that she had just called [facility] regarding her referral that we sent over for esophageal manometry. Patient stated that [facility] advised her that whenever we send over a referral it should state that it is for a esophageal manometry. Patient is asking that we call [facility] and set up an appointment for her and advise her of the date and time. Patient is requesting a call back. Please advise.

## 2023-09-26 NOTE — Telephone Encounter (Signed)
 Patient sent mychart message letting her know the order was faxed as requested 09/22/23.

## 2023-10-05 ENCOUNTER — Encounter: Payer: Self-pay | Admitting: Gastroenterology

## 2023-10-13 ENCOUNTER — Ambulatory Visit: Admitting: Gastroenterology

## 2023-10-13 ENCOUNTER — Encounter: Payer: Self-pay | Admitting: Gastroenterology

## 2023-10-13 VITALS — BP 123/74 | HR 71 | Temp 98.1°F | Resp 14 | Ht 61.0 in | Wt 172.0 lb

## 2023-10-13 DIAGNOSIS — K2100 Gastro-esophageal reflux disease with esophagitis, without bleeding: Secondary | ICD-10-CM

## 2023-10-13 DIAGNOSIS — K222 Esophageal obstruction: Secondary | ICD-10-CM | POA: Diagnosis not present

## 2023-10-13 DIAGNOSIS — R1310 Dysphagia, unspecified: Secondary | ICD-10-CM

## 2023-10-13 DIAGNOSIS — Q399 Congenital malformation of esophagus, unspecified: Secondary | ICD-10-CM | POA: Diagnosis not present

## 2023-10-13 DIAGNOSIS — K2289 Other specified disease of esophagus: Secondary | ICD-10-CM | POA: Diagnosis not present

## 2023-10-13 DIAGNOSIS — T85521A Displacement of esophageal anti-reflux device, initial encounter: Secondary | ICD-10-CM

## 2023-10-13 DIAGNOSIS — K449 Diaphragmatic hernia without obstruction or gangrene: Secondary | ICD-10-CM | POA: Diagnosis not present

## 2023-10-13 MED ORDER — SODIUM CHLORIDE 0.9 % IV SOLN: 500.0000 mL | Freq: Once | INTRAVENOUS | Status: DC

## 2023-10-13 NOTE — Progress Notes (Signed)
 Report to PACU, RN, vss, BBS= Clear.

## 2023-10-13 NOTE — Progress Notes (Signed)
 HISTORY OF PRESENT ILLNESS:   [name] is a 58 y.o. female, patient of Dr. Chales Abrahams with GERD complicated by severe esophagitis, esophageal stricture, and slipped fundoplication.  Previous [facility] patient with colonoscopy with Dr. Marca Ancona 2020.   Established with this office August 18, 2023 with complaints of dysphagia and abdominal burning pain.  Subsequently underwent upper endoscopy with Dr. Chales Abrahams August 24, 2023.  She was found to have severe erosive esophagitis, and esophageal stricture which was dilated to 13.5 mm, 5 cm hiatal hernia (slipped fundoplication), and Cameron erosions.  She was placed on omeprazole 40 mg twice daily and Carafate elixir 4 times daily.  Contacted the office with complaints of September 12, 2023.  Dr. Chales Abrahams recommended continuing medical therapy and keep plans for repeat upper endoscopy with dilation in [place].  She is scheduled for repeat endoscopy with dilation in [place] 2025.  She was also scheduled for manometry (not until July) for possible redo antireflux surgery.   Patient is worked onto my schedule today after contacting the on-call physician with abdominal complaints.  Dr. Chales Abrahams is out of the office this week.  Patient tells me that she has epigastric discomfort.  She tells me that she is compliant with medical therapy.  She tells me that she was seen at [facility] complaining of dysphagia.  She works there.  She did have a CT neck soft tissue with contrast February 1.  This was unremarkable.   She complains that her workup cannot be expedited.  She is worried that her problems with the hernia worsened short term.   REVIEW OF SYSTEMS:   All non-GI ROS negative.     Past Medical History:  Diagnosis Date   Anemia     Asthma     Bursitis of left hip     Decreased appetite     GERD (gastroesophageal reflux disease)     History of hiatal hernia      repaired   History of stomach ulcers     Hypertension     Migraine      "maybe twice in the last 10 yrs" (10/16/2013)    PONV (postoperative nausea and vomiting)     Shortness of breath dyspnea                 Past Surgical History:  Procedure Laterality Date   BARTHOLIN GLAND CYST EXCISION       BREAST LUMPECTOMY WITH RADIOACTIVE SEED LOCALIZATION Left 01/23/2016    Procedure: BREAST LUMPECTOMY WITH RADIOACTIVE SEED LOCALIZATION;  Surgeon: Avel Peace, MD;  Location: [facility] OR;  Service: General;  Laterality: Left;   CESAREAN SECTION   1989   CHOLECYSTECTOMY   ~ 2000   EPIGASTRIC HERNIA REPAIR   07/2003   HERNIA REPAIR       INCISION AND DRAINAGE ABSCESS   10/2005    Bartholin abscess.   JOINT REPLACEMENT        Right   TONSILLECTOMY AND ADENOIDECTOMY   1983   TOTAL HIP ARTHROPLASTY Right 06/05/2017   TOTAL HIP ARTHROPLASTY Right 06/06/2017    Procedure: RIGHT TOTAL HIP ARTHROPLASTY ANTERIOR APPROACH;  Surgeon: Samson Frederic, MD;  Location: [facility] OR;  Service: Orthopedics;  Laterality: Right;  Needs RNFA   TUBAL LIGATION   1994          Social History [name]  reports that she quit smoking about 9 years ago. Her smoking use included cigarettes. She started smoking about 10 years ago. She has never used  smokeless tobacco. She reports that she does not currently use alcohol. She reports that she does not use drugs.   family history includes Cancer (age of onset: 66) in her mother; Diabetes in her sister; Heart attack (age of onset: 54) in her sister; Heart attack (age of onset: 41) in her father.   Allergies       Allergies  Allergen Reactions   Amoxicillin Rash      PATIENT HAS HAD A PCN REACTION WITH IMMEDIATE RASH, FACIAL/TONGUE/THROAT SWELLING, SOB, OR LIGHTHEADEDNESS WITH HYPOTENSION:  #  #  #  YES  #  #  #   HAS PT DEVELOPED SEVERE RASH INVOLVING MUCUS MEMBRANES or SKIN NECROSIS: #  #  #  YES  #  #  #     "On my face" Has patient had a PCN reaction that required hospitalization: No Has patient had a PCN reaction occurring within the last 10 years:  #  #  #  UNKNOWN  #  #  #  .      Montelukast Hives   Ampicillin Rash      PATIENT HAS HAD A PCN REACTION WITH IMMEDIATE RASH, FACIAL/TONGUE/THROAT SWELLING, SOB, OR LIGHTHEADEDNESS WITH HYPOTENSION: # # # YES # # #  HAS PT DEVELOPED SEVERE RASH INVOLVING MUCUS MEMBRANES or SKIN NECROSIS: # # # YES # # # "On my face"e Has patient had a PCN reaction that required hospitalization: No Has patient had a PCN reaction occurring within the last 10 years:  #  #  #  UNKNOWN  #  #  #          Mobic [Meloxicam] Rash   Montelukast Sodium Rash   Phenergan [Promethazine Hcl] Nausea And Vomiting            PHYSICAL EXAMINATION: Vital signs: BP 118/88   Pulse 86   Ht 5\' 1"  (1.549 m)   Wt 172 lb (78 kg)   LMP 11/11/2019   BMI 32.50 kg/m   Constitutional: generally well-appearing, no acute distress Psychiatric: alert and oriented x3, cooperative Eyes: extraocular movements intact, anicteric, conjunctiva pink Mouth: oral pharynx moist, no lesions Neck: supple no lymphadenopathy Cardiovascular: heart regular rate and rhythm, no murmur Lungs: clear to auscultation bilaterally Abdomen: soft, mild tenderness to palpation in the midportion, nondistended, no obvious ascites, no peritoneal signs, normal bowel sounds, no organomegaly Rectal: Omitted Extremities: no clubbing cyanosis, or lower extremity edema bilaterally Skin: no lesions on visible extremities Neuro: No focal deficits.  Cranial nerves intact   ASSESSMENT:   1.  GERD complicated by erosive esophagitis and peptic stricture 2.  Slipped fundoplication 3.  Abdominal discomfort likely related to the same 4.  Colonoscopy elsewhere 2020 with diminutive polyp non-adenomatous, diverticulosis, and hemorrhoids     PLAN:   1.  Reflux precautions 2.  Continue twice daily PPI 3.  Continue Carafate 4.  Keep plans with Dr. Chales Abrahams for repeat EGD with dilation Dor[place] 10, 2025. 5.  Will order contrast CT of the abdomen pelvis now to rule out other possible causes for her pain  despite compliance with medical therapy 6.  Further recommendations after the above per Dr. Chales Abrahams   A total time of 45 minutes was spent preparing to see the patient, obtaining comprehensive history, performing medically appropriate physical exam, counseling and educating the patient regarding above listed issues, ordering advanced imaging study, and documenting clinical information in the health record     For EGD with dil today. Scheduled for  manometry Conrinue Ppis BID, carafate  RG

## 2023-10-13 NOTE — Progress Notes (Signed)
 Called to room to assist during endoscopic procedure.  Patient ID and intended procedure confirmed with present staff. Received instructions for my participation in the procedure from the performing physician.

## 2023-10-13 NOTE — Progress Notes (Signed)
 Patient received to PACU 1343, vital signs stable, patient drowsy, coughing at intervals, no complaint of pain.or discomfort. 1413 Patient complaining of shortness of breath, vital signs stable, oxygen saturation 100 %, patient becoming anxious, Dr. Chales Abrahams notified. 1428 Dr. Chales Abrahams in to assess patient, order noted to give patient an albuterol treatment. 1437 Patient finished albuterol treatment and states feeling " much better " .  No complaint of shortness of breath or any discomfort. Vital signs stable, oxygen saturation 100% . Dr. Chales Abrahams in to see patient before patient discharged. 1457 Patient discharged to home with friend, vital signs stable, no complaint of pain or discomfort. B.Kinzey Sheriff RN.

## 2023-10-13 NOTE — Patient Instructions (Signed)
 Please read handouts provided. Continue present medications, including twice daily PPIs. Resume previous diet.    YOU HAD AN ENDOSCOPIC PROCEDURE TODAY AT THE [facility]:   Refer to the procedure report that was given to you for any specific questions about what was found during the examination.  If the procedure report does not answer your questions, please call your gastroenterologist to clarify.  If you requested that your care partner not be given the details of your procedure findings, then the procedure report has been included in a sealed envelope for you to review at your convenience later.  YOU SHOULD EXPECT: Some feelings of bloating in the abdomen. Passage of more gas than usual.  Walking can help get rid of the air that was put into your GI tract during the procedure and reduce the bloating. If you had a lower endoscopy (such as a colonoscopy or flexible sigmoidoscopy) you may notice spotting of blood in your stool or on the toilet paper. If you underwent a bowel prep for your procedure, you may not have a normal bowel movement for a few days.  Please Note:  You might notice some irritation and congestion in your nose or some drainage.  This is from the oxygen used during your procedure.  There is no need for concern and it should clear up in a day or so.  SYMPTOMS TO REPORT IMMEDIATELY:  Following upper endoscopy (EGD)  Vomiting of blood or coffee ground material  New chest pain or pain under the shoulder blades  Painful or persistently difficult swallowing  New shortness of breath  Fever of 100F or higher  Black, tarry-looking stools  For urgent or emergent issues, a gastroenterologist can be reached at any hour by calling [phone]. Do not use MyChart messaging for urgent concerns.    DIET:  We do recommend a small meal at first, but then you may proceed to your regular diet.  Drink plenty of fluids but you should avoid alcoholic beverages for 24  hours.  ACTIVITY:  You should plan to take it easy for the rest of today and you should NOT DRIVE or use heavy machinery until tomorrow (because of the sedation medicines used during the test).    FOLLOW UP: Our staff will call the number listed on your records the next business day following your procedure.  We will call around 7:15- 8:00 am to check on you and address any questions or concerns that you may have regarding the information given to you following your procedure. If we do not reach you, we will leave a message.     If any biopsies were taken you will be contacted by phone or by letter within the next 1-3 weeks.  Please call us at [phone] if you have not heard about the biopsies in 3 weeks.    SIGNATURES/CONFIDENTIALITY: You and/or your care partner have signed paperwork which will be entered into your electronic medical record.  These signatures attest to the fact that that the information above on your After Visit Summary has been reviewed and is understood.  Full responsibility of the confidentiality of this discharge information lies with you and/or your care-partner.

## 2023-10-13 NOTE — Op Note (Signed)
 [facility] Patient Name: [name] Procedure Date: 10/13/2023 12:31 PM MRN: [id number] Endoscopist: Lynann Bologna , MD, [id number] Age: 58 Referring MD:  Date of Birth: [birth year] Gender: Female Account #: [id number] Procedure:                Upper GI endoscopy Indications:              Dysphagia in pt with previous history of Nissen's                            fundoplication. Medicines:                Monitored Anesthesia Care Procedure:                Pre-Anesthesia Assessment:                           - Prior to the procedure, a History and Physical                            was performed, and patient medications and                            allergies were reviewed. The patient's tolerance of                            previous anesthesia was also reviewed. The risks                            and benefits of the procedure and the sedation                            options and risks were discussed with the patient.                            All questions were answered, and informed consent                            was obtained. Prior Anticoagulants: The patient has                            taken no anticoagulant or antiplatelet agents. ASA                            Grade Assessment: II - A patient with mild systemic                            disease. After reviewing the risks and benefits,                            the patient was deemed in satisfactory condition to                            undergo the procedure.  After obtaining informed consent, the endoscope was                            passed under direct vision. Throughout the                            procedure, the patient's blood pressure, pulse, and                            oxygen saturations were monitored continuously. The                            GIF [id number] #[id number] was introduced through the                            mouth, and advanced to the second part of  duodenum.                            The upper GI endoscopy was accomplished without                            difficulty. The patient tolerated the procedure                            well. Scope In: Scope Out: Findings:                 The lower third of the esophagus was moderately                            tortuous with mucus/secretions stasis. The                            esophagitis had completely healed.                           One benign-appearing, intrinsic moderate                            (circumferential scarring or stenosis; an endoscope                            may pass) stenosis vs muscular Schatzki's ring was                            found 33 cm from the incisors. This stenosis                            measured 1.2 cm (inner diameter) x less than one cm                            (in length). The stenosis was traversed. A TTS                            dilator was passed  through the scope. Dilation with                            a 15-16.5-18 mm balloon dilator was performed to 18                            mm.                           A 5 cm hiatal hernia was present. There was                            evidence of slipped Nissen's fundoplication.                           The gastroesophageal flap valve was visualized                            endoscopically and classified as Hill Grade IV (no                            fold, wide open lumen, hiatal hernia present).                           The exam was otherwise without abnormality. Complications:            No immediate complications. Estimated Blood Loss:     Estimated blood loss: none. 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 Dilated.                           - 5 cm hiatal hernia with slipped Nissen's                            fundoplication                           - Gastroesophageal flap valve classified as Hill                             Grade IV (no fold, wide open lumen, hiatal hernia                            present).                           - The examination was otherwise normal.                           - No specimens collected. Recommendation:           - Patient has a contact number available for  emergencies. The signs and symptoms of potential                            delayed complications were discussed with the                            patient. Return to normal activities tomorrow.                            Written discharge instructions were provided to the                            patient.                           - Resume previous diet.                           - Continue present medications including twice                            daily PPIs.                           - She has appointment with [facility] for                            possible redo Nissen's. She also appointment for                            esophageal manometry.                           - The findings and recommendations were discussed                            with the patient's family.                           - Barium swallow and CT scan was reviewed. Lynann Bologna, MD 10/13/2023 1:49:48 PM This report has been signed electronically.

## 2023-10-14 ENCOUNTER — Telehealth: Payer: Self-pay

## 2023-10-14 NOTE — Telephone Encounter (Signed)
  Follow up Call-     10/13/2023   12:34 PM 08/24/2023    9:28 AM 08/24/2023    9:22 AM  Call back number  Post procedure Call Back phone  # [phone]   Permission to leave phone message Yes  Yes     Patient questions:  Do you have a fever, pain , or abdominal swelling? No. Pain Score  0 *  Have you tolerated food without any problems? Yes.    Have you been able to return to your normal activities? Yes.    Do you have any questions about your discharge instructions: Diet   No. Medications  No. Follow up visit  No.  Do you have questions or concerns about your Care? No.  Actions: * If pain score is 4 or above: No action needed, pain <4.

## 2023-10-17 ENCOUNTER — Ambulatory Visit: Payer: BC Managed Care – PPO | Admitting: Gastroenterology

## 2023-10-18 DIAGNOSIS — R1310 Dysphagia, unspecified: Secondary | ICD-10-CM | POA: Diagnosis not present

## 2023-10-18 DIAGNOSIS — Z87891 Personal history of nicotine dependence: Secondary | ICD-10-CM | POA: Diagnosis not present

## 2023-10-18 DIAGNOSIS — K2100 Gastro-esophageal reflux disease with esophagitis, without bleeding: Secondary | ICD-10-CM | POA: Diagnosis not present

## 2023-10-18 DIAGNOSIS — K449 Diaphragmatic hernia without obstruction or gangrene: Secondary | ICD-10-CM | POA: Diagnosis not present

## 2023-11-02 ENCOUNTER — Other Ambulatory Visit: Payer: Self-pay | Admitting: Nurse Practitioner

## 2023-11-02 DIAGNOSIS — I10 Essential (primary) hypertension: Secondary | ICD-10-CM

## 2023-11-02 NOTE — Telephone Encounter (Signed)
 Requesting: AMLODIPINE  BESYLATE 10 MG TAB  Last Visit: 09/15/2023 Next Visit: Visit date not found Last Refill: 08/16/2023  Please Advise

## 2023-11-11 DIAGNOSIS — R1310 Dysphagia, unspecified: Secondary | ICD-10-CM | POA: Diagnosis not present

## 2023-11-11 DIAGNOSIS — Z880 Allergy status to penicillin: Secondary | ICD-10-CM | POA: Diagnosis not present

## 2023-11-11 DIAGNOSIS — K449 Diaphragmatic hernia without obstruction or gangrene: Secondary | ICD-10-CM | POA: Diagnosis not present

## 2023-11-11 DIAGNOSIS — K219 Gastro-esophageal reflux disease without esophagitis: Secondary | ICD-10-CM | POA: Diagnosis not present

## 2023-11-11 DIAGNOSIS — Z87891 Personal history of nicotine dependence: Secondary | ICD-10-CM | POA: Diagnosis not present

## 2023-11-14 ENCOUNTER — Telehealth: Payer: Self-pay | Admitting: *Deleted

## 2023-11-14 NOTE — Transitions of Care (Post Inpatient/ED Visit) (Signed)
 11/14/2023  Name: [name] MRN: [id number] DOB: Sep 19, 1965  Today's TOC FU Call Status: Today's TOC FU Call Status:: Successful TOC FU Call Completed TOC FU Call Complete Date: 11/14/23 Patient's Name and Date of Birth confirmed.  Transition Care Management Follow-up Telephone Call Date of Discharge: 11/13/23 Discharge Facility: Other [facility]) Name of Other (Non[facility]) Discharge Facility: [facility] Type of Discharge: Inpatient Admission Primary Inpatient Discharge Diagnosis:: Hiatal hernia How have you been since you were released from the hospital?: Worse (Patient is having nausea and vomiting and unable to keep liquids.) Any questions or concerns?: Yes Patient Questions/Concerns:: Patient nauseated and unable to keep liquids down. Patient Questions/Concerns Addressed: Other: (RN gave the patient the number to call [facility] and make aware. RN told patient to be sure and take her nausea medication.)  Items Reviewed: Did you receive and understand the discharge instructions provided?: Yes Medications obtained,verified, and reconciled?: Yes (Medications Reviewed) Any new allergies since your discharge?: No Dietary orders reviewed?: Yes Type of Diet Ordered:: Continue your liquid diet upon discharge as you were previously instructed. You will advance your diet gradually 2-3 weeks after surgery as previously instructed and guided by Dr. Enedina Harrow Do you have support at home?: Yes People in Home [RPT]: alone Name of Support/Comfort Primary Source: brother  Medications Reviewed Today: Medications Reviewed Today     Reviewed by Luxe Cuadros, Patterson Bora, RN (Case Manager) on 11/14/23 at 1353  Med List Status: <None>   Medication Order Taking? Sig Documenting Provider Last Dose Status Informant  0.9 %  sodium chloride  infusion [id number]   Lajuan Pila, MD  Active   acetaminophen  (TYLENOL ) 500 MG tablet [id number] Yes Take 1 tablet (500 mg total) by mouth every 6 (six) hours as  needed. McElwee, Lauren A, NP Taking Active   albuterol  (PROVENTIL ) (2.5 MG/3ML) 0.083% nebulizer solution [id number] Yes Take 3 mLs (2.5 mg total) by nebulization every 6 (six) hours as needed for wheezing or shortness of breath. Masoudi, Steffan Edison, MD Taking Active Self  albuterol  (VENTOLIN  HFA) 108 (90 Base) MCG/ACT inhaler [id number] Yes Inhale 2 puffs into the lungs every 6 (six) hours as needed for wheezing or shortness of breath. Gavin Kast, FNP Taking Active   amLODipine  (NORVASC ) 10 MG tablet [id number] Yes TAKE 1 TABLET BY MOUTH EVERY DAY McElwee, Lauren A, NP Taking Active   budesonide -formoterol  (SYMBICORT ) 80-4.5 MCG/ACT inhaler [id number] Yes INHALE TWO PUFFS BY MOUTH INTO LUNGS TWICE DAILY Gavin Kast, FNP Taking Active   cetirizine (ZYRTEC) 10 MG tablet [id number] Yes Take 10 mg by mouth daily. [provider] Taking Active Self           Med Note Alline Areas   Fri Feb 02, 2019 11:43 PM)    diclofenac  Sodium (VOLTAREN ) 1 % GEL [id number] Yes APPLY 2 G TOPICALLY 4 (FOUR) TIMES DAILY AS NEEDED (PAIN). Jarold Merlin B, FNP Taking Active   docusate sodium  (COLACE) 100 MG capsule [id number] Yes Take 100 mg by mouth 2 (two) times daily. [provider] Taking Active   HYDROmorphone  (DILAUDID ) 2 MG tablet [id number] Yes Take 2 mg by mouth every 4 (four) hours as needed for severe pain (pain score 7-10). [provider] Taking Active            Med Note Jeana Michaels Nov 14, 2023  1:53 PM)  as needed for Pain for up to 10 days    omeprazole  (PRILOSEC) 40 MG capsule [id number] Yes Take 1 capsule (40 mg  total) by mouth in the morning and at bedtime. Lajuan Pila, MD Taking Active   ondansetron  (ZOFRAN ) 4 MG tablet [id number] Yes Take 4 mg by mouth every 8 (eight) hours as needed for nausea or vomiting. [provider] Taking Active   sucralfate  (CARAFATE ) 1 GM/10ML suspension [id number] Yes Take 10 mLs (1 g total) by mouth 4 (four) times  daily. Lajuan Pila, MD Taking Active   traZODone  (DESYREL ) 50 MG tablet [id number] Yes TAKE 0.5-1 TABLETS BY MOUTH AT BEDTIME AS NEEDED FOR SLEEP. Odette Benjamin, NP Taking Active             Home Care and Equipment/Supplies: Were Home Health Services Ordered?: NA Any new equipment or medical supplies ordered?: NA  Functional Questionnaire: Do you need assistance with bathing/showering or dressing?: No Do you need assistance with meal preparation?: No Do you need assistance with eating?: No Do you have difficulty maintaining continence: No Do you have difficulty managing or taking your medications?: No  Follow up appointments reviewed: PCP Follow-up appointment confirmed?: NA (Patient declined RN to assist with making a appointment with PCP) Specialist Hospital Follow-up appointment confirmed?: Yes Date of Specialist follow-up appointment?: 11/22/23 Follow-Up Specialty Provider:: [id number] Dr Ladena Picking Priscilla Brothers [id number] Janyce Mercy PA general surgery Do you need transportation to your follow-up appointment?: No Do you understand care options if your condition(s) worsen?: Yes-patient verbalized understanding  SDOH Interventions Today    Flowsheet Row Most Recent Value  SDOH Interventions   Food Insecurity Interventions Intervention Not Indicated  Housing Interventions Intervention Not Indicated  Transportation Interventions Intervention Not Indicated, Patient Resources (Friends/Family)  Utilities Interventions Intervention Not Indicated       Goals Addressed             This Visit's Progress    [facility] Transitions of Care (TOC) Care Plan       Problems:  Recent Hospitalization for treatment of Hiatal Hernia Medication management barrier Take nausea medications as prescribed  Goal:  Over the next 30 days, the patient will not experience hospital readmission  Interventions:  Transitions of Care: Doctor Visits  - discussed the importance of doctor  visits Referral to Longitudinal Nurse Case Manager for Ongoing follow-up Post discharge activity limitations prescribed by provider reviewed. Do not lift anything over 15 lbs or do any repetitive bending or squatting for 4 weeks.  Post-op wound/incision care reviewed with patient/caregiver.Do not soak in a tub or swim for 2 weeks. You may shower in 24 hours, but do not rub incisions. Do not lift anything over 15 lbs or do any repetitive bending or squatting for 4 weeks. Your incisions were closed with absorbable stitches and surgical glue. This will flake off in approximately 2 weeks.  Reviewed number to contact is having complications  Patient Self Care Activities:  Attend all scheduled provider appointments Call pharmacy for medication refills 3-7 days in advance of running out of medications Call provider office for new concerns or questions  Notify RN Care Manager of TOC call rescheduling needs Participate in Transition of Care Program/Attend TOC scheduled calls Perform all self care activities independently  Take medications as prescribed    Plan:  An initial telephone outreach has been scheduled for: [id number] Telephone follow up appointment with care management team member scheduled for:  Orpha Blade [id number] 1:15 PM            Una Ganser BSN RN [facility] Health Care Management Coordinator [email or web address] Direct Dial: [phone]  Fax: [phone] Website: [email or web address]

## 2023-11-14 NOTE — Telephone Encounter (Signed)
 Noted.

## 2023-11-22 ENCOUNTER — Telehealth

## 2023-11-22 DIAGNOSIS — Z9889 Other specified postprocedural states: Secondary | ICD-10-CM | POA: Diagnosis not present

## 2023-11-22 DIAGNOSIS — Z8719 Personal history of other diseases of the digestive system: Secondary | ICD-10-CM | POA: Diagnosis not present

## 2023-11-22 DIAGNOSIS — K5903 Drug induced constipation: Secondary | ICD-10-CM | POA: Diagnosis not present

## 2023-11-22 DIAGNOSIS — R1319 Other dysphagia: Secondary | ICD-10-CM | POA: Diagnosis not present

## 2023-11-23 ENCOUNTER — Other Ambulatory Visit: Payer: Self-pay

## 2023-11-23 NOTE — Transitions of Care (Post Inpatient/ED Visit) (Signed)
 Transition of Care week 2  Visit Note  11/23/2023  Name: [name] MRN: [id number]          DOB: 25-Aug-1965  Situation: Patient enrolled in [facility] 30-day program. Visit completed with pt by telephone.   Background:   Initial Transition Care Management Follow-up Telephone Call    Past Medical History:  Diagnosis Date   Anemia    Asthma    Bursitis of left hip    Decreased appetite    GERD (gastroesophageal reflux disease)    History of hiatal hernia    repaired   History of stomach ulcers    Hypertension    Migraine    "maybe twice in the last 10 yrs" (10/16/2013)   PONV (postoperative nausea and vomiting)    Shortness of breath dyspnea     Assessment: Patient sounds sleepy or sluggish on the phone.  Reports no BM and states that she continues to feel bloated.  Patient Reported Symptoms: Cognitive Cognitive Status: Alert and oriented to person, place, and time, Normal speech and language skills      Neurological Neurological Review of Symptoms: No symptoms reported    HEENT HEENT Symptoms Reported: No symptoms reported      Cardiovascular Cardiovascular Symptoms Reported: No symptoms reported    Respiratory Respiratory Symptoms Reported: No symptoms reported    Endocrine Patient reports the following symptoms related to hypoglycemia or hyperglycemia : No symptoms reported    Gastrointestinal Gastrointestinal Symptoms Reported: Abdominal pain or discomfort, Constipation, Nausea, Flatulence Additional Gastrointestinal Details: Recent surgery.  Reports that she saw GI yesterday for an office visit. Reports no bowel movement for 11 days since surgery. Was prescribed Miralax .  Reviewed how to take medications Gastrointestinal Conditions: Constipation Gastrointestinal Management Strategies: Medication therapy Gastrointestinal Comment: Reviewed with pateint to continue her liquid diet foe 14 days post surgery.  Reviewed importance of taking Miralax , drinking plenty of  water and being as active as possible.    Genitourinary Genitourinary Symptoms Reported: No symptoms reported    Integumentary Integumentary Symptoms Reported: Incision    Musculoskeletal Musculoskelatal Symptoms Reviewed: No symptoms reported        Psychosocial Psychosocial Symptoms Reported: No symptoms reported           Medications Reviewed Today     Reviewed by Vanetta Generous, RN (Registered Nurse) on 11/23/23 at 1352  Med List Status: <None>   Medication Order Taking? Sig Documenting Provider Last Dose Status Informant  0.9 %  sodium chloride  infusion [id number]   Lajuan Pila, MD  Consider Medication Status and Discontinue (Change in therapy)   acetaminophen  (TYLENOL ) 500 MG tablet [id number] Yes Take 1 tablet (500 mg total) by mouth every 6 (six) hours as needed. McElwee, Lauren A, NP Taking Active   albuterol  (PROVENTIL ) (2.5 MG/3ML) 0.083% nebulizer solution [id number] Yes Take 3 mLs (2.5 mg total) by nebulization every 6 (six) hours as needed for wheezing or shortness of breath. Masoudi, Steffan Edison, MD Taking Active Self  albuterol  (VENTOLIN  HFA) 108 (90 Base) MCG/ACT inhaler [id number] Yes Inhale 2 puffs into the lungs every 6 (six) hours as needed for wheezing or shortness of breath. Gavin Kast, FNP Taking Active   amLODipine  (NORVASC ) 10 MG tablet [id number] Yes TAKE 1 TABLET BY MOUTH EVERY DAY McElwee, Lauren A, NP Taking Active   budesonide -formoterol  (SYMBICORT ) 80-4.5 MCG/ACT inhaler [id number]  INHALE TWO PUFFS BY MOUTH INTO LUNGS TWICE DAILY Gavin Kast, FNP  Active   cetirizine (ZYRTEC) 10 MG tablet [id number] Yes Take  10 mg by mouth daily. [provider] Taking Active Self           Med Note Alline Areas   Fri Feb 02, 2019 11:43 PM)    diclofenac  Sodium (VOLTAREN ) 1 % GEL [id number] Yes APPLY 2 G TOPICALLY 4 (FOUR) TIMES DAILY AS NEEDED (PAIN). Jarold Merlin B, FNP Taking Active   docusate sodium  (COLACE) 100 MG capsule [id number] Yes Take 100 mg  by mouth 2 (two) times daily. [provider] Taking Active   HYDROmorphone  (DILAUDID ) 2 MG tablet [id number] Yes Take 2 mg by mouth every 4 (four) hours as needed for severe pain (pain score 7-10). [provider] Taking Active            Med Note Jeana Michaels Nov 14, 2023  1:53 PM)  as needed for Pain for up to 10 days    omeprazole  (PRILOSEC) 40 MG capsule [id number] Yes Take 1 capsule (40 mg total) by mouth in the morning and at bedtime. Lajuan Pila, MD Taking Active   ondansetron  (ZOFRAN ) 4 MG tablet [id number] Yes Take 4 mg by mouth every 8 (eight) hours as needed for nausea or vomiting. [provider] Taking Active   polyethylene glycol (MIRALAX  / GLYCOLAX ) 17 g packet [id number] Yes Take 17 g by mouth daily. [provider] Taking Active   sucralfate  (CARAFATE ) 1 GM/10ML suspension [id number] Yes Take 10 mLs (1 g total) by mouth 4 (four) times daily. Lajuan Pila, MD Taking Active   traZODone  (DESYREL ) 50 MG tablet [id number] Yes TAKE 0.5-1 TABLETS BY MOUTH AT BEDTIME AS NEEDED FOR SLEEP. Odette Benjamin, NP Taking Active             Recommendation:   PCP Follow-up  Reviewed importance of patient making a follow up appointment with PCP. Offered to assist and offer was declined.   Follow Up Plan:   Telephone follow up appointment date/time:  12/01/2023 at 1pm.   Orpha Blade, RN, BSN, CEN Population Health- Transition of Care Team.  [facility] [phone]

## 2023-11-23 NOTE — Patient Instructions (Signed)
 Visit Information  Thank you for taking time to visit with me today. Please don't hesitate to contact me if I can be of assistance to you before our next scheduled telephone appointment.  Following are the goals we discussed today:   Goals Addressed             This Visit's Progress    [facility] Transitions of Care (TOC) Care Plan       Problems:  Recent Hospitalization for treatment of Hiatal Hernia 11/23/2023  Patient reports that she is constipated and gassy.  Reports she saw GI yesterday.  Medication management barrier Take nausea medications as prescribed  Goal:  Over the next 30 days, the patient will not experience hospital readmission  Interventions:  Transitions of Care: Doctor Visits  - discussed the importance of doctor visits Review diet with patient again Reviewed GI visit from [facility] yesterday and recommendations.  Post discharge activity limitations prescribed by provider reviewed. Do not lift anything over 15 lbs or do any repetitive bending or squatting for 4 weeks.  Post-op wound/incision care reviewed with patient/caregiver.Do not soak in a tub or swim for 2 weeks. You may shower in 24 hours, but do not rub incisions. Do not lift anything over 15 lbs or do any repetitive bending or squatting for 4 weeks. Your incisions were closed with absorbable stitches and surgical glue. This will flake off in approximately 2 weeks.  Encouraged patient to make an appointment with PCP  Patient Self Care Activities:  Attend all scheduled provider appointments Call pharmacy for medication refills 3-7 days in advance of running out of medications Call provider office for new concerns or questions  Notify RN Care Manager of [facility] call rescheduling needs Participate in Transition of Care Program/Attend [facility] scheduled calls Perform all self care activities independently  Take medications as prescribed   Call MD if not able to have a BM  Plan:  Next Transition of care call on 12/01/2023  T 1PM.  Encouraged patient to call MD for additional concerns or questions.          Our next appointment is by telephone on 12/01/2023 at 1pm  Please call the care guide team at [phone] if you need to cancel or reschedule your appointment.   If you are experiencing a Mental Health or Behavioral Health Crisis or need someone to talk to, please call the Suicide and Crisis Lifeline: 988 call the [place]  National Suicide Prevention Lifeline: (272)179-9164 or TTY: (725)488-4069 TTY 913-690-9575) to talk to a trained counselor call 1-800-273-TALK (toll free, 24 hour hotline) call 911   Patient verbalizes understanding of instructions and care plan provided today and agrees to view in MyChart. Active MyChart status and patient understanding of how to access instructions and care plan via MyChart confirmed with patient.     Orpha Blade, RN, BSN, CEN [employer]- Transition of Care Team.  [facility] [phone]

## 2023-12-01 ENCOUNTER — Telehealth: Payer: Self-pay

## 2023-12-06 DIAGNOSIS — Z4889 Encounter for other specified surgical aftercare: Secondary | ICD-10-CM | POA: Diagnosis not present

## 2024-02-01 ENCOUNTER — Ambulatory Visit (HOSPITAL_COMMUNITY): Admit: 2024-02-01 | Admitting: Gastroenterology

## 2024-02-01 ENCOUNTER — Encounter (HOSPITAL_COMMUNITY): Payer: Self-pay

## 2024-02-01 SURGERY — MANOMETRY, ESOPHAGUS

## 2024-02-13 ENCOUNTER — Ambulatory Visit: Admitting: Nurse Practitioner

## 2024-02-14 DIAGNOSIS — R1319 Other dysphagia: Secondary | ICD-10-CM | POA: Diagnosis not present

## 2024-02-14 DIAGNOSIS — R6881 Early satiety: Secondary | ICD-10-CM | POA: Diagnosis not present

## 2024-02-14 DIAGNOSIS — K219 Gastro-esophageal reflux disease without esophagitis: Secondary | ICD-10-CM | POA: Diagnosis not present

## 2024-02-14 DIAGNOSIS — Z8719 Personal history of other diseases of the digestive system: Secondary | ICD-10-CM | POA: Diagnosis not present

## 2024-02-14 DIAGNOSIS — Z9889 Other specified postprocedural states: Secondary | ICD-10-CM | POA: Diagnosis not present

## 2024-02-20 ENCOUNTER — Encounter: Payer: Self-pay | Admitting: Nurse Practitioner

## 2024-02-20 ENCOUNTER — Ambulatory Visit: Admitting: Nurse Practitioner

## 2024-02-20 VITALS — BP 122/80 | HR 72 | Temp 98.1°F | Ht 61.0 in | Wt 174.0 lb

## 2024-02-20 DIAGNOSIS — R200 Anesthesia of skin: Secondary | ICD-10-CM

## 2024-02-20 DIAGNOSIS — M779 Enthesopathy, unspecified: Secondary | ICD-10-CM

## 2024-02-20 DIAGNOSIS — E119 Type 2 diabetes mellitus without complications: Secondary | ICD-10-CM

## 2024-02-20 DIAGNOSIS — M25552 Pain in left hip: Secondary | ICD-10-CM | POA: Diagnosis not present

## 2024-02-20 LAB — POCT GLYCOSYLATED HEMOGLOBIN (HGB A1C)
HbA1c POC (<> result, manual entry): 6.5 % (ref 4.0–5.6)
HbA1c, POC (controlled diabetic range): 6.5 % (ref 0.0–7.0)
HbA1c, POC (prediabetic range): 6.5 % — AB (ref 5.7–6.4)
Hemoglobin A1C: 6.5 % — AB (ref 4.0–5.6)

## 2024-02-20 MED ORDER — TRAMADOL HCL 50 MG PO TABS: 50.0000 mg | ORAL_TABLET | Freq: Two times a day (BID) | ORAL | 0 refills | Status: AC | PRN

## 2024-02-20 MED ORDER — LANCETS MISC. MISC: 1.0000 | Freq: Every day | 0 refills | Status: AC

## 2024-02-20 MED ORDER — IBUPROFEN 800 MG PO TABS: 800.0000 mg | ORAL_TABLET | Freq: Three times a day (TID) | ORAL | 0 refills | Status: AC | PRN

## 2024-02-20 MED ORDER — BLOOD GLUCOSE TEST VI STRP: 1.0000 | ORAL_STRIP | Freq: Every day | 0 refills | Status: AC

## 2024-02-20 MED ORDER — LANCET DEVICE MISC: 1.0000 | Freq: Every day | 0 refills | Status: AC

## 2024-02-20 MED ORDER — BLOOD GLUCOSE MONITORING SUPPL DEVI: 1.0000 | Freq: Every day | 0 refills | Status: AC

## 2024-02-20 MED ORDER — LIDOCAINE 5 % EX PTCH: 1.0000 | MEDICATED_PATCH | CUTANEOUS | 0 refills | Status: DC

## 2024-02-20 NOTE — Assessment & Plan Note (Signed)
 She has left thumb tendinitis with persistent numbness localized to the thumb, with no relief from ibuprofen . Refer to an orthopedic specialist for further evaluation.

## 2024-02-20 NOTE — Assessment & Plan Note (Signed)
 Associated with left thumb tendonitis. Referral to ortho. Sugars are well controlled with A1c at 6.5%.

## 2024-02-20 NOTE — Assessment & Plan Note (Signed)
 Chronic, stable. A1c today is 6.5%. No medication is currently required as dietary management is effectively maintaining glucose levels. She was taking lisinopril  in the past, but it caused abnormal potassium levels. She declines cholesterol medication today. Order a glucometer for home blood sugar monitoring. Follow-up in 3 months.

## 2024-02-20 NOTE — Assessment & Plan Note (Signed)
 Her chronic left hip osteoarthritis is accompanied by worsening left lower extremity pain, described as aching, burning, and sharp, which worsens with movement and at night. Differential diagnosis includes sciatica and arthritis pain. A previous CT scan indicated moderate arthritis. She will be referred to an orthopedic specialist for further evaluation. Prescribe a lidocaine  patch for hip pain and tramadol  50 mg tablets, to be taken twice daily as needed. PDMP reviewed. Continue ibuprofen  800 mg, to be taken with food. Follow-up in 3 months.

## 2024-02-20 NOTE — Patient Instructions (Addendum)
 It was great to see you!  Start tramadol  1 tablet twice a day as needed for pain   Start lidocaine  patch daily for the left hip  Start ibuprofen  1 tablet every 8 hours as needed for pain with food   I have placed a referral to ortho   Start checking your blood sugars in the morning  Let's follow-up in 3 months, sooner if you have concerns.  If a referral was placed today, you will be contacted for an appointment. Please note that routine referrals can sometimes take up to 3-4 weeks to process. Please call our office if you haven't heard anything after this time frame.  Take care,  Tinnie Harada, NP

## 2024-02-20 NOTE — Progress Notes (Signed)
 Established Patient Office Visit  Subjective   Patient ID: [name], female    DOB: Sep 24, 1965  Age: 58 y.o. MRN: [id number]  Chief Complaint  Patient presents with   Medical Management of Chronic Issues    Couple months numbness in left thumb left hip and leg pain for 4 weeks.  Wanting a refill on HYDROmorphone  (DILAUDID ) 2 MG tablet    HPI Discussed the use of AI scribe software for clinical note transcription with the patient, who gave verbal consent to proceed.  History of Present Illness   Kendria L Alleman is a 58 year old female who presents with left hip and leg pain, and left thumb numbness.   She experiences significant leg and hip pain, especially when lying down, described as aching, burning, and sharp, worsening at night. She uses a heating pad and takes ibuprofen , up to four or five pills, for temporary relief but is concerned about the dosage. She also uses a pain relief cream, which is running low. The pain has persisted for weeks and is worsening.  She has constant numbness in her left thumb, unaffected by ibuprofen . She predominantly uses her right hand at work but occasionally uses her left hand, which may contribute to the issue. The numbness is persistent and noticeable upon touch.       ROS See pertinent positives and negatives per HPI.    Objective:     BP 122/80 (BP Location: Left Arm, Patient Position: Sitting, Cuff Size: Normal)   Pulse 72   Temp 98.1 F (36.7 C) (Temporal)   Ht 5' 1 (1.549 m)   Wt 174 lb (78.9 kg)   LMP 11/11/2019   SpO2 99%   BMI 32.88 kg/m  BP Readings from Last 3 Encounters:  02/20/24 122/80  10/13/23 123/74  09/20/23 118/88   Wt Readings from Last 3 Encounters:  02/20/24 174 lb (78.9 kg)  10/13/23 172 lb (78 kg)  09/20/23 172 lb (78 kg)      Physical Exam Vitals and nursing note reviewed.  Constitutional:      General: She is not in acute distress.    Appearance: Normal appearance.  HENT:     Head:  Normocephalic.  Eyes:     Conjunctiva/sclera: Conjunctivae normal.  Cardiovascular:     Rate and Rhythm: Normal rate and regular rhythm.     Pulses: Normal pulses.     Heart sounds: Normal heart sounds.  Pulmonary:     Effort: Pulmonary effort is normal.     Breath sounds: Normal breath sounds.  Musculoskeletal:        General: Tenderness (left hip and left lateral thigh) present. No swelling.     Cervical back: Normal range of motion.     Comments: Phalen's test negative bilaterally  Skin:    General: Skin is warm.  Neurological:     General: No focal deficit present.     Mental Status: She is alert and oriented to person, place, and time.  Psychiatric:        Mood and Affect: Mood normal.        Behavior: Behavior normal.        Thought Content: Thought content normal.        Judgment: Judgment normal.    The 10-year ASCVD risk score (Arnett DK, et al., 2019) is: 12.9%    Assessment & Plan:   Problem List Items Addressed This Visit       Endocrine   Diabetes mellitus  without complication (HCC)   Chronic, stable. A1c today is 6.5%. No medication is currently required as dietary management is effectively maintaining glucose levels. She was taking lisinopril  in the past, but it caused abnormal potassium levels. She declines cholesterol medication today. Order a glucometer for home blood sugar monitoring. Follow-up in 3 months.         Musculoskeletal and Integument   Tendonitis   She has left thumb tendinitis with persistent numbness localized to the thumb, with no relief from ibuprofen . Refer to an orthopedic specialist for further evaluation.      Relevant Orders   Ambulatory referral to Orthopedic Surgery     Other   Left hip pain - Primary   Her chronic left hip osteoarthritis is accompanied by worsening left lower extremity pain, described as aching, burning, and sharp, which worsens with movement and at night. Differential diagnosis includes sciatica and  arthritis pain. A previous CT scan indicated moderate arthritis. She will be referred to an orthopedic specialist for further evaluation. Prescribe a lidocaine  patch for hip pain and tramadol  50 mg tablets, to be taken twice daily as needed. PDMP reviewed. Continue ibuprofen  800 mg, to be taken with food. Follow-up in 3 months.       Relevant Orders   Ambulatory referral to Orthopedic Surgery   Numbness   Associated with left thumb tendonitis. Referral to ortho. Sugars are well controlled with A1c at 6.5%.       Relevant Orders   POCT glycosylated hemoglobin (Hb A1C) (Completed)    Return in about 3 months (around 05/22/2024) for CPE.    Tinnie DELENA Harada, NP

## 2024-02-21 ENCOUNTER — Telehealth: Payer: Self-pay

## 2024-02-21 NOTE — Telephone Encounter (Signed)
 Pharmacy Patient Advocate Encounter   Received notification from CoverMyMeds that prior authorization for Lidocaine  5% patches is required/requested.   Insurance verification completed.   The patient is insured through [employer] .   Per test claim: PA required; PA submitted to above mentioned insurance via Latent Key/confirmation #/EOC [facility] Status is pending

## 2024-02-22 ENCOUNTER — Telehealth: Payer: Self-pay

## 2024-02-22 MED ORDER — DICLOFENAC SODIUM 1 % EX GEL: 2.0000 g | Freq: Four times a day (QID) | CUTANEOUS | 1 refills | Status: DC | PRN

## 2024-02-22 NOTE — Telephone Encounter (Signed)
 Requesting: Diclofenac  Sodium 1% Last Visit: 02/20/2024 Next Visit: Visit date not found Last Refill: 02/03/2023  Please Advise

## 2024-02-22 NOTE — Telephone Encounter (Signed)
 Pharmacy Patient Advocate Encounter  Received notification from [employer] that Prior Authorization for Lidocaine  5% patches has been APPROVED from 01/21/2024 to 02/19/2025   PA #/Case ID/Reference #: [id number]

## 2024-02-27 DIAGNOSIS — R6881 Early satiety: Secondary | ICD-10-CM | POA: Diagnosis not present

## 2024-02-29 ENCOUNTER — Other Ambulatory Visit: Payer: Self-pay | Admitting: Nurse Practitioner

## 2024-02-29 DIAGNOSIS — I10 Essential (primary) hypertension: Secondary | ICD-10-CM

## 2024-02-29 MED ORDER — AMLODIPINE BESYLATE 10 MG PO TABS: 10.0000 mg | ORAL_TABLET | Freq: Every day | ORAL | 3 refills | Status: DC

## 2024-02-29 NOTE — Telephone Encounter (Signed)
 Requesting: amLODipine  (NORVASC ) 10 MG tablet , diclofenac  Sodium (VOLTAREN ) 1 % GEL  Last Visit: 02/20/2024 Next Visit: Visit date not found Last Refill: 11/02/2023,   Please Advise

## 2024-02-29 NOTE — Telephone Encounter (Unsigned)
 Copied from CRM [id number]. Topic: Clinical - Medication Refill >> Feb 29, 2024  8:34 AM Ismael A wrote: Medication:   amLODipine  (NORVASC ) 10 MG tablet diclofenac  Sodium (VOLTAREN ) 1 % GEL  Has the patient contacted their pharmacy? Yes (Agent: If no, request that the patient contact the pharmacy for the refill. If patient does not wish to contact the pharmacy document the reason why and proceed with request.) (Agent: If yes, when and what did the pharmacy advise?)  This is the patient's preferred pharmacy:  [id number] [place], [place] - [address] [place] [address] Phone: [phone] Fax: [phone]  Is this the correct pharmacy for this prescription? Yes If no, delete pharmacy and type the correct one.   Has the prescription been filled recently? No  Is the patient out of the medication? Yes - patient stated she attempted to pic up Diclofenac  Sodium cream and they were trying to charge her for the Rx   Has the patient been seen for an appointment in the last year OR does the patient have an upcoming appointment? Yes  Can we respond through MyChart? Yes  Agent: Please be advised that Rx refills may take up to 3 business days. We ask that you follow-up with your pharmacy.

## 2024-03-01 NOTE — Telephone Encounter (Signed)
 Copied from CRM #[id number]. Topic: Clinical - Prescription Issue >> Mar 01, 2024  1:15 PM Melissa C wrote: Reason for CRM: patient called regarding diclofenac  Sodium (VOLTAREN ) 1 % GEL that was requested on 8/27. She stated that the pharmacy called and said the doctor needs to run the request through e-scripts and then send to them so that way she won't have a copay for the medication. Please advise.  [facility] and Rx of Voltaren  is $33.00 for 3 boxes and patient wants it to be sent through e-scripts.

## 2024-03-02 ENCOUNTER — Other Ambulatory Visit: Payer: Self-pay | Admitting: Nurse Practitioner

## 2024-03-02 NOTE — Telephone Encounter (Signed)
 I called and spoke with patient and she said that the Rx was supposed to be run through [facility] and it was not ran under the same type when the Rx was renewed.

## 2024-03-08 ENCOUNTER — Other Ambulatory Visit (INDEPENDENT_AMBULATORY_CARE_PROVIDER_SITE_OTHER)

## 2024-03-08 ENCOUNTER — Encounter: Payer: Self-pay | Admitting: Sports Medicine

## 2024-03-08 ENCOUNTER — Ambulatory Visit: Admitting: Sports Medicine

## 2024-03-08 DIAGNOSIS — M25552 Pain in left hip: Secondary | ICD-10-CM

## 2024-03-08 DIAGNOSIS — M1812 Unilateral primary osteoarthritis of first carpometacarpal joint, left hand: Secondary | ICD-10-CM | POA: Diagnosis not present

## 2024-03-08 DIAGNOSIS — R200 Anesthesia of skin: Secondary | ICD-10-CM

## 2024-03-08 DIAGNOSIS — G8929 Other chronic pain: Secondary | ICD-10-CM

## 2024-03-08 DIAGNOSIS — M1612 Unilateral primary osteoarthritis, left hip: Secondary | ICD-10-CM

## 2024-03-08 DIAGNOSIS — M65312 Trigger thumb, left thumb: Secondary | ICD-10-CM

## 2024-03-08 DIAGNOSIS — R202 Paresthesia of skin: Secondary | ICD-10-CM

## 2024-03-08 MED ORDER — METHYLPREDNISOLONE ACETATE 40 MG/ML IJ SUSP
40.0000 mg | INTRAMUSCULAR | Status: AC | PRN
  Administered 2024-03-08: 40 mg via INTRA_ARTICULAR

## 2024-03-08 MED ORDER — LIDOCAINE HCL 1 % IJ SOLN
0.5000 mL | INTRAMUSCULAR | Status: AC | PRN
  Administered 2024-03-08: .5 mL

## 2024-03-08 NOTE — Progress Notes (Signed)
 Patient says that she has had left hip pain for about a month. Her pain does go down the quadricep and stops at the knee. She says that her pain is in the front of the hip and groin, and she feels it around the side and back of the hip as well. She denies any popping or clicking in the hip, although she does mention popping and clicking in the left knee. She has been taking Ibuprofen , as well as using topical treatments and ice/heat.  Patient has had left thumb pain for about a month. She points over the [facility] joint space when describing her pain, although it does seem to move up the thumb and she has numbness in the thumb at times. She has also been using topical treatments and ice/heat for the thumb. She denies any popping or clicking in the thumb, and denies any pain or numbness/tingling in the hand or wrist. Patient is left-hand dominant.

## 2024-03-08 NOTE — Progress Notes (Signed)
 [name] - 58 y.o. female MRN [id number]  Date of birth: 06/21/1966  Office Visit Note: Visit Date: 03/08/2024 PCP: Nedra Tinnie LABOR, NP Referred by: Nedra Tinnie LABOR, NP  Subjective: Chief Complaint  Patient presents with   Left Hip - Pain   Left Hand - Pain   HPI: [name] is a pleasant 58 y.o. female who presents today for chronic left thumb pain, chronic left hip pain.  Left thumb -she has had left thumb pain for just over 1 month.  She points to pain at the base of the thumb.  Does have some crepitus with certain motions.  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  She is left-hand dominant.  She does get occasional numbness/tingling on the radial aspect of the tip of the thumb only.  Left hip -she has pain all about the hip joint in the front that does radiate into the groin as well as radiates around the back and side of the posterior hip joint.  She does have restriction in range of motion.  She has pain when going from sitting to standing.  She is using ibuprofen  and using topical treatments.  Ibuprofen  does upset her stomach so she can only take this infrequently.  She does have a allergy to NSAIDs as she took meloxicam previously which caused a skin rash/reaction.  She is a type-II diabetic. Lab Results  Component Value Date   HGBA1C 6.5 (A) 02/20/2024   HGBA1C 6.5 02/20/2024   HGBA1C 6.5 (A) 02/20/2024   HGBA1C 6.5 02/20/2024   Pertinent ROS were reviewed with the patient and found to be negative unless otherwise specified above in HPI.   Assessment & Plan: Visit Diagnoses:  1. Unilateral primary osteoarthritis, left hip   2. Chronic left hip pain   3. Primary osteoarthritis of first carpometacarpal joint of left hand   4. Numbness and tingling of left thumb    Plan: Impression is left thumb pain with moderate arthritic change in osteophytic spurring at the base of the [facility] joint.  She is left-hand  dominant and does have pain with repetitive motions at work.  We discussed oral medications, CMC cool comfort bracing, injection therapy.  She would like to move forward with injection, we did perform CMC joint injection today.  Tolerated well.  Advised on postinjection protocol.  She does have reported tingling at the tip of the thumb, but she has no other provocative examination for carpal tunnel syndrome, question whether this is referred pain from restricted [facility] joint motion.  We will reevaluate status postinjection, if continued numbness/tingling, could consider nerve conduction study.  In terms of the left hip, she does have rather advanced osteoarthritis, but is hoping to avoid surgical replacement at the current time.  We discussed getting her started into a home rehab program following an injection under ultrasound guidance.  We will see her back over the next few weeks for this hip injection to follow-up on the problem.  At that time we will likely provide her with a home rehab exercise program for strengthening and stability about the hip joint.  Okay for Tylenol , ibuprofen  as long as this is not irritating her stomach, would avoid further NSAIDs given her superficial/skin rash/outbreak with previous NSAIDs.  Meds & Orders: No orders of the defined types were placed in this encounter.   Orders Placed This Encounter  Procedures   Small Joint Inj  XR HIP UNILAT W OR W/O PELVIS 2-3 VIEWS LEFT   XR Finger Thumb Left     Procedures: Small Joint Inj: L thumb CMC on 03/08/2024 1:35 PM Indications: pain Details: 25 G needle, radial approach Medications: 0.5 mL lidocaine  1 %; 40 mg methylPREDNISolone  acetate 40 MG/ML Outcome: tolerated well, no immediate complications Procedure, treatment alternatives, risks and benefits explained, specific risks discussed. Consent was given by the patient. Immediately prior to procedure a time out was called to verify the correct patient, procedure, equipment,  support staff and site/side marked as required. Patient was prepped and draped in the usual sterile fashion.          Clinical History: No specialty comments available.  She reports that she quit smoking about 9 years ago. Her smoking use included cigarettes. She started smoking about 10 years ago. She has never used smokeless tobacco.  Recent Labs    02/20/24 1559  HGBA1C 6.5*  6.5  6.5  6.5*    Objective:   Vital Signs: LMP 11/11/2019   Physical Exam  Gen: Well-appearing, in no acute distress; non-toxic CV: Well-perfused. Warm.  Resp: Breathing unlabored on room air; no wheezing. Psych: Fluid speech in conversation; appropriate affect; normal thought process  Ortho Exam - Left thumb: Positive TTP with palpation at the base of the left thumb.  Positive CMC grind test.  Negative Tinel's test.  Negative Durkan's test.  There is pain with manipulation about the [facility] joint with ulnar/radial deviation without laxity.  Cap refill less than 2 seconds.  - Left hip: No redness swelling or effusion.  There is limited internal and external range of motion with crepitus.  Positive FADIR, positive Stinchfield testing.  There is a degree of insufficiency with hip abduction testing.  Imaging: XR Finger Thumb Left Result Date: 03/08/2024 3 views of the left thumb including AP, oblique and lateral film were ordered and reviewed by myself today.  X-rays demonstrate moderate osteoarthritic change about the [facility] joint.  There is STT arthritic change with more bony sclerosis over the distal pole of the trapezium to thumb.  No acute fracture noted.  XR HIP UNILAT W OR W/O PELVIS 2-3 VIEWS LEFT Result Date: 03/08/2024 2 views of the left hip including AP and lateral femoral ordered and reviewed by myself today.  X-rays demonstrate areas of near complete cartilage loss with moderate to severe OA most significant in the superior lateral aspect of the hip joint.  There is bony sclerosis in this location.   Moderate osteophytosis.  Status post right hip THA.  *Did review CT scan of the abdomen/pelvis with attention only to the left hip, there is moderate to severe osteoarthritic change present.  Narrative & Impression  CLINICAL DATA:  abdominal pain; hiatal hernia   EXAM: CT ABDOMEN AND PELVIS WITH CONTRAST   TECHNIQUE: Multidetector CT imaging of the abdomen and pelvis was performed using the standard protocol following bolus administration of intravenous contrast.   RADIATION DOSE REDUCTION: This exam was performed according to the departmental dose-optimization program which includes automated exposure control, adjustment of the mA and/or kV according to patient size and/or use of iterative reconstruction technique.   CONTRAST:  OMNIPAQUE  IOHEXOL  300 MG/ML  SOLN   COMPARISON:  01/23/2016   FINDINGS: Lower chest: No pleural or pericardial effusion. Small hiatal hernia. Visualized lung bases clear.   Hepatobiliary: No focal liver abnormality is seen. Status post cholecystectomy. No biliary dilatation.   Pancreas: Unremarkable. No pancreatic ductal dilatation or surrounding inflammatory  changes.   Spleen: Normal in size without focal abnormality.   Adrenals/Urinary Tract: No adrenal mass. No urolithiasis or hydronephrosis. Multiple cortical lesions in both kidneys, some of which can be characterized as cysts, largest 1.7 cm 12 HU left upper pole; no followup recommended. Urinary bladder nondistended.   Stomach/Bowel: Small hiatal hernia. The stomach is partially distended, without acute finding. Small-bowel nondilated. Normal appendix. The colon is partially distended, with multiple distal descending and proximal sigmoid diverticula. Scattered small metallic densities project in the lumen of the nondistended rectum.   Vascular/Lymphatic: Mild calcified aortic plaque without aneurysm or stenosis. No abdominal or pelvic adenopathy.   Reproductive: Uterus and  bilateral adnexa are unremarkable.   Other: No ascites.  Pelvic phleboliths.  No free air.   Musculoskeletal: Post right hip arthroplasty. Moderately severe left hip DJD.   IMPRESSION: 1. No acute findings. 2. Small hiatal hernia. 3. Descending and sigmoid diverticulosis.     Electronically Signed   By: JONETTA Faes M.D.   On: 09/20/2023 20:04    Past Medical/Family/Surgical/Social History: Medications & Allergies reviewed per EMR, new medications updated. 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   At risk for obstructive sleep apnea 01/14/2022   Snoring 11/18/2021   Insomnia 10/17/2021   Aortic atherosclerosis (HCC) 06/14/2021   Dizziness 06/14/2021   Chronic right-sided low back pain without sciatica 08/20/2020   Adjustment disorder with disturbance of conduct    Depression, major, single episode, complete remission (HCC) 02/03/2019   Radiculopathy affecting upper extremity 01/17/2019   Asthma 02/20/2018   Osteoarthritis of right hip 12/23/2016   GERD (gastroesophageal reflux disease) 03/08/2015   Encounter for screening mammogram for malignant neoplasm of breast 03/08/2015   History of cholecystectomy 03/08/2015   Prediabetes 08/05/2014   Hyperlipidemia 08/05/2014   Hypertension 08/04/2014   Iron deficiency anemia 08/04/2014   Class 1 obesity 08/04/2014   History of diverticulitis 10/16/2013   Past Medical History:  Diagnosis Date   Anemia    Asthma    Bursitis of left hip    Decreased  appetite    Diabetes mellitus without complication (HCC) 02/20/2024   GERD (gastroesophageal reflux disease)    History of hiatal hernia    repaired   History of stomach ulcers    Hypertension    Migraine    maybe twice in the last 10 yrs (10/16/2013)   PONV (postoperative nausea and vomiting)    Shortness of breath dyspnea    Family History  Problem Relation Age of Onset   Cancer Mother 14       unknown   Heart attack Father 63   Heart attack Sister 41   Diabetes Sister    Colon cancer Neg Hx    Stomach cancer Neg Hx    Esophageal cancer Neg Hx    Rectal cancer Neg Hx    Past Surgical History:  Procedure Laterality Date   BARTHOLIN GLAND CYST EXCISION     BREAST LUMPECTOMY WITH RADIOACTIVE SEED LOCALIZATION Left 01/23/2016   Procedure: BREAST LUMPECTOMY WITH RADIOACTIVE  SEED LOCALIZATION;  Surgeon: Krystal Russell, MD;  Location: [facility] OR;  Service: General;  Laterality: Left;   CESAREAN SECTION  1989   CHOLECYSTECTOMY  ~ 2000   EPIGASTRIC HERNIA REPAIR  07/2003   HERNIA REPAIR     INCISION AND DRAINAGE ABSCESS  10/2005   Bartholin abscess.   JOINT REPLACEMENT     Right   TONSILLECTOMY AND ADENOIDECTOMY  1983   TOTAL HIP ARTHROPLASTY Right 06/05/2017   TOTAL HIP ARTHROPLASTY Right 06/06/2017   Procedure: RIGHT TOTAL HIP ARTHROPLASTY ANTERIOR APPROACH;  Surgeon: Fidel Rogue, MD;  Location: [facility] OR;  Service: Orthopedics;  Laterality: Right;  Needs RNFA   TUBAL LIGATION  1994   Social History   Occupational History   Occupation: residential worker  Tobacco Use   Smoking status: Former    Current packs/day: 0.00    Types: Cigarettes    Start date: 06/01/2013    Quit date: 06/01/2014    Years since quitting: 9.7   Smokeless tobacco: Never  Vaping Use   Vaping status: Never Used  Substance and Sexual Activity   Alcohol use: Not Currently    Alcohol/week: 0.0 standard drinks of alcohol   Drug use: No   Sexual activity: Yes

## 2024-03-29 ENCOUNTER — Other Ambulatory Visit: Payer: Self-pay | Admitting: Nurse Practitioner

## 2024-03-29 MED ORDER — LIDOCAINE 5 % EX PTCH: 1.0000 | MEDICATED_PATCH | CUTANEOUS | 0 refills | Status: DC

## 2024-03-29 NOTE — Telephone Encounter (Signed)
 Requesting: lidocaine  (LIDODERM ) 5 %  Last Visit: 02/20/2024 Next Visit: Visit date not found Last Refill: 03/06/2024  Please Advise

## 2024-03-29 NOTE — Telephone Encounter (Signed)
 Copied from CRM [id number]. Topic: Clinical - Medication Refill >> Mar 29, 2024  1:22 PM Savanna F wrote: Medication: lidocaine  (LIDODERM ) 5 %  Pt states that pharmacy requests a new prescription for this as she has no refills left.   Has the patient contacted their pharmacy? Yes (Agent: If no, request that the patient contact the pharmacy for the refill. If patient does not wish to contact the pharmacy document the reason why and proceed with request.) (Agent: If yes, when and what did the pharmacy advise?)  This is the patient's preferred pharmacy:  [id number] [place], [place] - [address] [place] [address] Phone: [phone] Fax: [phone]  Is this the correct pharmacy for this prescription? Yes If no, delete pharmacy and type the correct one.   Has the prescription been filled recently? No  Is the patient out of the medication? Yes  Has the patient been seen for an appointment in the last year OR does the patient have an upcoming appointment? Yes  Can we respond through MyChart? Yes  Agent: Please be advised that Rx refills may take up to 3 business days. We ask that you follow-up with your pharmacy.

## 2024-04-02 ENCOUNTER — Ambulatory Visit (INDEPENDENT_AMBULATORY_CARE_PROVIDER_SITE_OTHER): Admitting: Sports Medicine

## 2024-04-02 ENCOUNTER — Encounter: Payer: Self-pay | Admitting: Sports Medicine

## 2024-04-02 ENCOUNTER — Other Ambulatory Visit: Payer: Self-pay

## 2024-04-02 ENCOUNTER — Ambulatory Visit: Admitting: Sports Medicine

## 2024-04-02 DIAGNOSIS — M1612 Unilateral primary osteoarthritis, left hip: Secondary | ICD-10-CM

## 2024-04-02 DIAGNOSIS — M1812 Unilateral primary osteoarthritis of first carpometacarpal joint, left hand: Secondary | ICD-10-CM

## 2024-04-02 DIAGNOSIS — M25552 Pain in left hip: Secondary | ICD-10-CM | POA: Diagnosis not present

## 2024-04-02 DIAGNOSIS — G8929 Other chronic pain: Secondary | ICD-10-CM | POA: Diagnosis not present

## 2024-04-02 MED ORDER — METHYLPREDNISOLONE ACETATE 40 MG/ML IJ SUSP
60.0000 mg | INTRAMUSCULAR | Status: AC | PRN
  Administered 2024-04-02: 60 mg via INTRA_ARTICULAR

## 2024-04-02 MED ORDER — LIDOCAINE HCL 1 % IJ SOLN
4.0000 mL | INTRAMUSCULAR | Status: AC | PRN
  Administered 2024-04-02: 4 mL

## 2024-04-02 NOTE — Progress Notes (Signed)
 Patient says that her thumb is better, but not 100%. She says that her hip is still bothering her the same as it was at her last visit. She is here today for left hip injection.

## 2024-04-02 NOTE — Progress Notes (Signed)
 Media L Fang - 58 y.o. female MRN [id number]  Date of birth: 1966-01-08  Office Visit Note: Visit Date: 04/02/2024 PCP: Nedra Tinnie LABOR, NP Referred by: Nedra Tinnie LABOR, NP  Subjective: Chief Complaint  Patient presents with   Left Hip - Pain   HPI: [name] is a pleasant 58 y.o. female who presents today for chronic left hip pain with osteoarthritis, L-CMC OA/thumb pain.  Left thumb -left thumb is certainly better but not quite 100% since our [facility] joint injection.  She no longer is having the numbness/tingling at the tip of the left thumb.  Still has some pain with repetitive activity, is working on gripping activities with a washcloth/towel.  Left hip -her hip/leg pain is still rather bothersome.  This does radiate down the front of the thigh as well.  She has pain with transitioning.  She would like to move forward with hip injection.  *Dr. Fidel at [facility] did perform her contralateral right hip arthroplasty back in 06/2017.  She is using ibuprofen  800 mg as needed, written by her PCP.  Pertinent ROS were reviewed with the patient and found to be negative unless otherwise specified above in HPI.   Assessment & Plan: Visit Diagnoses:  1. Unilateral primary osteoarthritis, left hip   2. Chronic left hip pain   3. Primary osteoarthritis of first carpometacarpal joint of left hand    Plan: Impression is chronic left hip pain with advanced osteoarthritic change which is bone-on-bone over the superior lateral aspect of the hip joint.  She is wanting to avoid surgery if possible, we did proceed with ultrasound-guided left hip intra-articular injection today.  Advised on postinjection protocol.  After the first 48 to 72 hours of modified activity, I would like her to get started in some home rehab for the hip itself.  We did print out a customized handout for her to begin.  Her left thumb is doing better after the injection in the [facility] joint, she is no longer  having the numbness and tingling at the tip of the thumb, so believe more of her pain is emanating from the [facility] joint OA opposed to carpal tunnel related symptoms.  She will send me a MyChart message or follow-up in 1 month to see the degree of relief she had from her hip injection and the above treatment/therapy.  She can continue her ibuprofen  800 mg as needed.  If for some reason she does not receive significant or lasting relief from the injection, next step may include sending her back to Dr. Fidel for surgical evaluation for THA.  Follow-up: Return for [employer] message with update (0-100% improvement for hip/leg) in 1 month.   Meds & Orders: No orders of the defined types were placed in this encounter.   Orders Placed This Encounter  Procedures   US  Guided Needle Placement - No Linked Charges     Procedures: Large Joint Inj: L hip joint on 04/02/2024 4:22 PM Indications: pain Details: 22 G 3.5 in needle, ultrasound-guided anterior approach Medications: 4 mL lidocaine  1 %; 60 mg methylPREDNISolone  acetate 40 MG/ML Outcome: tolerated well, no immediate complications  Procedure: US -guided intra-articular hip injection, Left After discussion on risks/benefits/indications and informed verbal consent was obtained, a timeout was performed. Patient was lying supine on exam table. The hip was cleaned with betadine  and alcohol swabs. Then utilizing ultrasound guidance, the patient's femoral head and neck junction was identified and subsequently injected with 4:1.5 lidocaine :depomedrol via an in-plane approach  with ultrasound visualization of the injectate administered into the hip joint. Patient tolerated procedure well without immediate complications.  Procedure, treatment alternatives, risks and benefits explained, specific risks discussed. Consent was given by the patient. Immediately prior to procedure a time out was called to verify the correct patient, procedure, equipment, support  staff and site/side marked as required. Patient was prepped and draped in the usual sterile fashion.          Clinical History: No specialty comments available.  She reports that she quit smoking about 9 years ago. Her smoking use included cigarettes. She started smoking about 10 years ago. She has never used smokeless tobacco.  Recent Labs    02/20/24 1559  HGBA1C 6.5*  6.5  6.5  6.5*    Objective:   Vital Signs: LMP 11/11/2019   Physical Exam  Gen: Well-appearing, in no acute distress; non-toxic CV: Well-perfused. Warm.  Resp: Breathing unlabored on room air; no wheezing. Psych: Fluid speech in conversation; appropriate affect; normal thought process  Ortho Exam - Left hip: There is marked limitation with both internal and external logroll.  Positive FADIR and Stinchfield testing.  No specific greater trochanteric TTP.   Imaging:  *Previous imaging, reviewed today (taken 03/08/24):   XR Finger Thumb Left 3 views of the left thumb including AP, oblique and lateral film were  ordered and reviewed by myself today.  X-rays demonstrate moderate  osteoarthritic change about the [facility] joint.  There is STT arthritic change  with more bony sclerosis over the distal pole of the trapezium to thumb.   No acute fracture noted. XR HIP UNILAT W OR W/O PELVIS 2-3 VIEWS LEFT 2 views of the left hip including AP and lateral femoral ordered and  reviewed by myself today.  X-rays demonstrate areas of near complete  cartilage loss with moderate to severe OA most significant in the superior  lateral aspect of the hip joint.  There is bony sclerosis in this  location.  Moderate osteophytosis.  Status post right hip THA.  Past Medical/Family/Surgical/Social History: Medications & Allergies reviewed per EMR, new medications updated. 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   Non-restorative sleep 01/14/2022   Episodic circadian rhythm sleep disorder, shift work type 01/14/2022   At risk for obstructive sleep apnea 01/14/2022   Snoring 11/18/2021   Insomnia 10/17/2021   Aortic atherosclerosis 06/14/2021   Dizziness 06/14/2021   Chronic right-sided low back pain without sciatica 08/20/2020   Adjustment disorder with disturbance of conduct    Depression, major, single episode, complete remission 02/03/2019   Radiculopathy affecting upper extremity 01/17/2019   Asthma 02/20/2018   Osteoarthritis of right hip 12/23/2016   GERD (gastroesophageal reflux disease) 03/08/2015   Encounter for screening mammogram for malignant neoplasm of breast 03/08/2015   History of cholecystectomy 03/08/2015   Prediabetes 08/05/2014   Hyperlipidemia 08/05/2014   Hypertension 08/04/2014   Iron deficiency anemia 08/04/2014   Class 1 obesity 08/04/2014   History of diverticulitis 10/16/2013   Past Medical History:  Diagnosis Date   Anemia    Asthma    Bursitis of left hip    Decreased  appetite    Diabetes mellitus without complication (HCC) 02/20/2024   GERD (gastroesophageal reflux disease)    History of hiatal hernia    repaired   History of stomach ulcers    Hypertension    Migraine    maybe twice in the last 10 yrs (10/16/2013)   PONV (postoperative nausea and vomiting)    Shortness of breath dyspnea    Family History  Problem Relation Age of Onset   Cancer Mother 59       unknown   Heart attack Father 13   Heart attack Sister 15   Diabetes Sister    Colon cancer Neg Hx    Stomach cancer Neg Hx    Esophageal cancer Neg Hx    Rectal cancer Neg Hx    Past Surgical History:  Procedure Laterality Date    BARTHOLIN GLAND CYST EXCISION     BREAST LUMPECTOMY WITH RADIOACTIVE SEED LOCALIZATION Left 01/23/2016   Procedure: BREAST LUMPECTOMY WITH RADIOACTIVE SEED LOCALIZATION;  Surgeon: Krystal Russell, MD;  Location: [facility] OR;  Service: General;  Laterality: Left;   CESAREAN SECTION  1989   CHOLECYSTECTOMY  ~ 2000   EPIGASTRIC HERNIA REPAIR  07/2003   HERNIA REPAIR     INCISION AND DRAINAGE ABSCESS  10/2005   Bartholin abscess.   JOINT REPLACEMENT     Right   TONSILLECTOMY AND ADENOIDECTOMY  1983   TOTAL HIP ARTHROPLASTY Right 06/05/2017   TOTAL HIP ARTHROPLASTY Right 06/06/2017   Procedure: RIGHT TOTAL HIP ARTHROPLASTY ANTERIOR APPROACH;  Surgeon: Fidel Rogue, MD;  Location: [facility] OR;  Service: Orthopedics;  Laterality: Right;  Needs RNFA   TUBAL LIGATION  1994   Social History   Occupational History   Occupation: residential worker  Tobacco Use   Smoking status: Former    Current packs/day: 0.00    Types: Cigarettes    Start date: 06/01/2013    Quit date: 06/01/2014    Years since quitting: 9.8   Smokeless tobacco: Never  Vaping Use   Vaping status: Never Used  Substance and Sexual Activity   Alcohol use: Not Currently    Alcohol/week: 0.0 standard drinks of alcohol   Drug use: No   Sexual activity: Yes

## 2024-04-08 ENCOUNTER — Other Ambulatory Visit: Payer: Self-pay | Admitting: Nurse Practitioner

## 2024-04-09 NOTE — Telephone Encounter (Signed)
 Requesting: ACCU-CHEK SOFTCLIX LANCETS  Last Visit: 02/20/2024 Next Visit: Visit date not found Last Refill: 02/20/2024  Please Advise

## 2024-05-07 ENCOUNTER — Encounter: Payer: Self-pay | Admitting: Radiology

## 2024-05-08 ENCOUNTER — Other Ambulatory Visit: Payer: Self-pay | Admitting: Nurse Practitioner

## 2024-05-08 NOTE — Telephone Encounter (Signed)
 Requesting: diclofenac  Sodium (VOLTAREN ) 1 % Gel Last Visit: 02/20/2024 Next Visit: Visit date not found Last Refill: 02/22/2024  Please Advise

## 2024-05-25 ENCOUNTER — Ambulatory Visit: Payer: Self-pay | Admitting: Nurse Practitioner

## 2024-05-25 ENCOUNTER — Encounter: Payer: Self-pay | Admitting: Nurse Practitioner

## 2024-05-25 ENCOUNTER — Ambulatory Visit (INDEPENDENT_AMBULATORY_CARE_PROVIDER_SITE_OTHER): Admitting: Nurse Practitioner

## 2024-05-25 ENCOUNTER — Other Ambulatory Visit

## 2024-05-25 VITALS — BP 142/90 | Temp 96.9°F | Ht 61.0 in | Wt 170.0 lb

## 2024-05-25 DIAGNOSIS — E119 Type 2 diabetes mellitus without complications: Secondary | ICD-10-CM

## 2024-05-25 DIAGNOSIS — Z0000 Encounter for general adult medical examination without abnormal findings: Secondary | ICD-10-CM | POA: Diagnosis not present

## 2024-05-25 DIAGNOSIS — E785 Hyperlipidemia, unspecified: Secondary | ICD-10-CM

## 2024-05-25 DIAGNOSIS — G4730 Sleep apnea, unspecified: Secondary | ICD-10-CM | POA: Diagnosis not present

## 2024-05-25 DIAGNOSIS — R1033 Periumbilical pain: Secondary | ICD-10-CM

## 2024-05-25 DIAGNOSIS — R1310 Dysphagia, unspecified: Secondary | ICD-10-CM

## 2024-05-25 DIAGNOSIS — K219 Gastro-esophageal reflux disease without esophagitis: Secondary | ICD-10-CM

## 2024-05-25 DIAGNOSIS — Z1231 Encounter for screening mammogram for malignant neoplasm of breast: Secondary | ICD-10-CM

## 2024-05-25 DIAGNOSIS — I10 Essential (primary) hypertension: Secondary | ICD-10-CM

## 2024-05-25 LAB — CBC WITH DIFFERENTIAL/PLATELET
Basophils Absolute: 0.1 K/uL (ref 0.0–0.1)
Basophils Relative: 1.1 % (ref 0.0–3.0)
Eosinophils Absolute: 0.3 K/uL (ref 0.0–0.7)
Eosinophils Relative: 6.6 % — ABNORMAL HIGH (ref 0.0–5.0)
HCT: 36.5 % (ref 36.0–46.0)
Hemoglobin: 12 g/dL (ref 12.0–15.0)
Lymphocytes Relative: 47.3 % — ABNORMAL HIGH (ref 12.0–46.0)
Lymphs Abs: 2.2 K/uL (ref 0.7–4.0)
MCHC: 32.8 g/dL (ref 30.0–36.0)
MCV: 83.9 fl (ref 78.0–100.0)
Monocytes Absolute: 0.3 K/uL (ref 0.1–1.0)
Monocytes Relative: 5.8 % (ref 3.0–12.0)
Neutro Abs: 1.8 K/uL (ref 1.4–7.7)
Neutrophils Relative %: 39.2 % — ABNORMAL LOW (ref 43.0–77.0)
Platelets: 221 K/uL (ref 150.0–400.0)
RBC: 4.34 Mil/uL (ref 3.87–5.11)
RDW: 16 % — ABNORMAL HIGH (ref 11.5–15.5)
WBC: 4.7 K/uL (ref 4.0–10.5)

## 2024-05-25 LAB — LIPID PANEL
Cholesterol: 182 mg/dL (ref 0–200)
HDL: 43.7 mg/dL (ref >39.00)
LDL Cholesterol: 119 mg/dL — ABNORMAL HIGH (ref 0–99)
NonHDL: 138.31
Total CHOL/HDL Ratio: 4
Triglycerides: 97 mg/dL (ref 0.0–149.0)
VLDL: 19.4 mg/dL (ref 0.0–40.0)

## 2024-05-25 LAB — MICROALBUMIN / CREATININE URINE RATIO
Creatinine,U: 133 mg/dL
Microalb Creat Ratio: 62.1 mg/g — ABNORMAL HIGH (ref 0.0–30.0)
Microalb, Ur: 8.3 mg/dL — ABNORMAL HIGH (ref 0.0–1.9)

## 2024-05-25 LAB — COMPREHENSIVE METABOLIC PANEL WITH GFR
ALT: 11 U/L (ref 0–35)
AST: 13 U/L (ref 0–37)
Albumin: 4 g/dL (ref 3.5–5.2)
Alkaline Phosphatase: 66 U/L (ref 39–117)
BUN: 10 mg/dL (ref 6–23)
CO2: 29 meq/L (ref 19–32)
Calcium: 9.7 mg/dL (ref 8.4–10.5)
Chloride: 104 meq/L (ref 96–112)
Creatinine, Ser: 0.48 mg/dL (ref 0.40–1.20)
GFR: 105.8 mL/min (ref >60.00)
Glucose, Bld: 147 mg/dL — ABNORMAL HIGH (ref 70–99)
Potassium: 3.1 meq/L — ABNORMAL LOW (ref 3.5–5.1)
Sodium: 141 meq/L (ref 135–145)
Total Bilirubin: 0.4 mg/dL (ref 0.2–1.2)
Total Protein: 6.9 g/dL (ref 6.0–8.3)

## 2024-05-25 LAB — HEMOGLOBIN A1C: Hgb A1c MFr Bld: 6.7 % — ABNORMAL HIGH (ref 4.6–6.5)

## 2024-05-25 MED ORDER — OMEPRAZOLE 40 MG PO CPDR: 40.0000 mg | DELAYED_RELEASE_CAPSULE | Freq: Two times a day (BID) | ORAL | 11 refills | Status: AC

## 2024-05-25 MED ORDER — POTASSIUM CHLORIDE CRYS ER 20 MEQ PO TBCR: 40.0000 meq | EXTENDED_RELEASE_TABLET | Freq: Once | ORAL | 0 refills | Status: DC

## 2024-05-25 NOTE — Assessment & Plan Note (Signed)
Will check lipid panel, CMP, CBC and treat based on results.

## 2024-05-25 NOTE — Assessment & Plan Note (Signed)
 She refuses to use CPAP. Highly encouraged her to wear this at night since sleep apnea is severe.

## 2024-05-25 NOTE — Assessment & Plan Note (Signed)
 Chronic, stable. No medication is currently required as dietary management is effectively maintaining glucose levels. She was taking lisinopril  in the past, but it caused abnormal potassium levels. She declines cholesterol medication today. Check CMP, CBC, A1c, urine microalbumin today. Follow-up in 6 months.

## 2024-05-25 NOTE — Patient Instructions (Signed)
 It was great to see you!  We are checking your labs today and will let you know the results via mychart/phone.   I have refilled your omeprazole  to take daily as needed.   Let's follow-up in 6 months, sooner if you have concerns.  If a referral was placed today, you will be contacted for an appointment. Please note that routine referrals can sometimes take up to 3-4 weeks to process. Please call our office if you haven't heard anything after this time frame.  Take care,  Tinnie Harada, NP

## 2024-05-25 NOTE — Progress Notes (Addendum)
 BP (!) 142/90 (BP Location: Left Arm, Cuff Size: Large)   Temp (!) 96.9 F (36.1 C)   Ht 5' 1 (1.549 m)   Wt 170 lb (77.1 kg)   LMP 11/11/2019   BMI 32.12 kg/m    Subjective:    Patient ID: [name], female    DOB: 01/05/1966, 58 y.o.   MRN: [id number]  CC: Chief Complaint  Patient presents with   Annual Exam    With fasting labs, concerns with naval pain    HPI: [name] is a 58 y.o. female presenting on 05/25/2024 for comprehensive medical examination. Current medical complaints include:naval pain  She experienced some naval pain yesterday when she presses on it. She does have a history of hernia. She denies redness, constipation.   Depression and Anxiety Screen done today and results listed below:     05/25/2024   11:48 AM 11/14/2023    1:59 PM 08/16/2023    1:42 PM 10/18/2022    8:31 AM 10/16/2021    4:18 PM  Depression screen PHQ 2/9  Decreased Interest 0  0 0 0  Down, Depressed, Hopeless 0 0 0 0 0  PHQ - 2 Score 0 0 0 0 0  Altered sleeping 2   2 2   Tired, decreased energy 2   1 0  Change in appetite 1   0 1  Feeling bad or failure about yourself  0   0 0  Trouble concentrating 0   0 1  Moving slowly or fidgety/restless 0   0 0  Suicidal thoughts 0   0 0  PHQ-9 Score 5   3  4    Difficult doing work/chores Not difficult at all   Not difficult at all Not difficult at all     Data saved with a previous flowsheet row definition      05/25/2024   11:49 AM 10/18/2022    8:31 AM 10/16/2021    4:18 PM  GAD 7 : Generalized Anxiety Score  Nervous, Anxious, on Edge 0 0 0  Control/stop worrying 0 1 0  Worry too much - different things 0 1 1  Trouble relaxing 1 1 1   Restless 1 1 0  Easily annoyed or irritable 1 1 1   Afraid - awful might happen 0 0 0  Total GAD 7 Score 3 5 3   Anxiety Difficulty Not difficult at all Not difficult at all Not difficult at all    The patient does not have a history of falls. I did not complete a risk assessment for falls.  A plan of care for falls was not documented.   Past Medical History:  Past Medical History:  Diagnosis Date   Anemia    Asthma    Bursitis of left hip    Decreased appetite    Diabetes mellitus without complication (HCC) 02/20/2024   GERD (gastroesophageal reflux disease)    History of hiatal hernia    repaired   History of stomach ulcers    Hypertension    Migraine    maybe twice in the last 10 yrs (10/16/2013)   PONV (postoperative nausea and vomiting)    Shortness of breath dyspnea     Surgical History:  Past Surgical History:  Procedure Laterality Date   BARTHOLIN GLAND CYST EXCISION     BREAST LUMPECTOMY WITH RADIOACTIVE SEED LOCALIZATION Left 01/23/2016   Procedure: BREAST LUMPECTOMY WITH RADIOACTIVE SEED LOCALIZATION;  Surgeon: Krystal Russell, MD;  Location: [facility] OR;  Service:  General;  Laterality: Left;   CESAREAN SECTION  1989   CHOLECYSTECTOMY  ~ 2000   EPIGASTRIC HERNIA REPAIR  07/2003   HERNIA REPAIR     INCISION AND DRAINAGE ABSCESS  10/2005   Bartholin abscess.   JOINT REPLACEMENT     Right   TONSILLECTOMY AND ADENOIDECTOMY  1983   TOTAL HIP ARTHROPLASTY Right 06/05/2017   TOTAL HIP ARTHROPLASTY Right 06/06/2017   Procedure: RIGHT TOTAL HIP ARTHROPLASTY ANTERIOR APPROACH;  Surgeon: Fidel Rogue, MD;  Location: [facility] OR;  Service: Orthopedics;  Laterality: Right;  Needs RNFA   TUBAL LIGATION  1994    Medications:  Current Outpatient Medications on File Prior to Visit  Medication Sig   Accu-Chek Softclix Lancets lancets USE 1 EACH DAILY   acetaminophen  (TYLENOL ) 500 MG tablet Take 1 tablet (500 mg total) by mouth every 6 (six) hours as needed.   albuterol  (VENTOLIN  HFA) 108 (90 Base) MCG/ACT inhaler Inhale 2 puffs into the lungs every 6 (six) hours as needed for wheezing or shortness of breath.   amLODipine  (NORVASC ) 10 MG tablet Take 1 tablet (10 mg total) by mouth daily.   Blood Glucose Monitoring Suppl DEVI 1 each by Does not apply route daily. May  substitute to any manufacturer covered by patient's insurance.   budesonide -formoterol  (SYMBICORT ) 80-4.5 MCG/ACT inhaler INHALE TWO PUFFS BY MOUTH INTO LUNGS TWICE DAILY   cetirizine (ZYRTEC) 10 MG tablet Take 10 mg by mouth daily.   diclofenac  Sodium (VOLTAREN ) 1 % GEL APPLY 2 G TOPICALLY 4 (FOUR) TIMES DAILY AS NEEDED (PAIN).   Glucose Blood (BLOOD GLUCOSE TEST STRIPS) STRP 1 each by In Vitro route daily. May substitute to any manufacturer covered by patient's insurance.   ibuprofen  (ADVIL ) 800 MG tablet Take 1 tablet (800 mg total) by mouth every 8 (eight) hours as needed. Take with food   lidocaine  (LIDODERM ) 5 % Place 1 patch onto the skin daily. Remove & Discard patch within 12 hours or as directed by MD   albuterol  (PROVENTIL ) (2.5 MG/3ML) 0.083% nebulizer solution Take 3 mLs (2.5 mg total) by nebulization every 6 (six) hours as needed for wheezing or shortness of breath. (Patient not taking: Reported on 05/25/2024)   HYDROmorphone  (DILAUDID ) 2 MG tablet Take 2 mg by mouth every 4 (four) hours as needed for severe pain (pain score 7-10). (Patient not taking: Reported on 05/25/2024)   ondansetron  (ZOFRAN ) 4 MG tablet Take 4 mg by mouth every 8 (eight) hours as needed for nausea or vomiting. (Patient not taking: Reported on 05/25/2024)   traZODone  (DESYREL ) 50 MG tablet TAKE 0.5-1 TABLETS BY MOUTH AT BEDTIME AS NEEDED FOR SLEEP. (Patient not taking: Reported on 05/25/2024)   Current Facility-Administered Medications on File Prior to Visit  Medication   0.9 %  sodium chloride  infusion    Allergies:  Allergies  Allergen Reactions   Amoxicillin Rash    PATIENT HAS HAD A PCN REACTION WITH IMMEDIATE RASH, FACIAL/TONGUE/THROAT SWELLING, SOB, OR LIGHTHEADEDNESS WITH HYPOTENSION:  #  #  #  YES  #  #  #   HAS PT DEVELOPED SEVERE RASH INVOLVING MUCUS MEMBRANES or SKIN NECROSIS: #  #  #  YES  #  #  #     On my face Has patient had a PCN reaction that required hospitalization: No Has patient had  a PCN reaction occurring within the last 10 years:  #  #  #  UNKNOWN  #  #  #  .  Montelukast Hives   Gabapentin Itching   Ampicillin Rash    PATIENT HAS HAD A PCN REACTION WITH IMMEDIATE RASH, FACIAL/TONGUE/THROAT SWELLING, SOB, OR LIGHTHEADEDNESS WITH HYPOTENSION: # # # YES # # #  HAS PT DEVELOPED SEVERE RASH INVOLVING MUCUS MEMBRANES or SKIN NECROSIS: # # # YES # # # On my facee Has patient had a PCN reaction that required hospitalization: No Has patient had a PCN reaction occurring within the last 10 years:  #  #  #  UNKNOWN  #  #  #       Mobic [Meloxicam] Rash   Montelukast Sodium Rash   Phenergan  [Promethazine  Hcl] Nausea And Vomiting    Social History:  Social History   Socioeconomic History   Marital status: Single    Spouse name: Not on file   Number of children: Not on file   Years of education: Not on file   Highest education level: Not on file  Occupational History   Occupation: residential worker  Tobacco Use   Smoking status: Former    Current packs/day: 0.00    Types: Cigarettes    Start date: 06/01/2013    Quit date: 06/01/2014    Years since quitting: 9.9   Smokeless tobacco: Never  Vaping Use   Vaping status: Never Used  Substance and Sexual Activity   Alcohol use: Not Currently    Alcohol/week: 0.0 standard drinks of alcohol   Drug use: No   Sexual activity: Yes  Other Topics Concern   Not on file  Social History Narrative   Not on file   Social Drivers of Health   Financial Resource Strain: Low Risk  (11/16/2023)   Received from [facility]   Overall Financial Resource Strain (CARDIA)    Difficulty of Paying Living Expenses: Not hard at all  Recent Concern: Financial Resource Strain - Medium Risk (11/12/2023)   Received from [facility]   Overall Financial Resource Strain (CARDIA)    Difficulty of Paying Living Expenses: Somewhat hard  Food Insecurity: No Food Insecurity (11/16/2023)   Received from  [facility]   Hunger Vital Sign    Within the past 12 months, you worried that your food would run out before you got the money to buy more.: Never true    Within the past 12 months, the food you bought just didn't last and you didn't have money to get more.: Never true  Recent Concern: Food Insecurity - Food Insecurity Present (11/12/2023)   Received from [facility]   Hunger Vital Sign    Within the past 12 months, you worried that your food would run out before you got the money to buy more.: Sometimes true    Within the past 12 months, the food you bought just didn't last and you didn't have money to get more.: Sometimes true  Transportation Needs: No Transportation Needs (11/16/2023)   Received from [facility] - Transportation    In the past 12 months, has lack of transportation kept you from medical appointments or from getting medications?: No    Lack of Transportation (Non-Medical): No  Physical Activity: Not on file  Stress: Not on file  Social Connections: Unknown (11/16/2021)   Received from [facility]   Social Network    Social Network: Not on file  Intimate Partner Violence: Not At Risk (11/14/2023)   Humiliation, Afraid, Rape, and Kick questionnaire    Fear  of Current or Ex-Partner: No    Emotionally Abused: No    Physically Abused: No    Sexually Abused: No   Social History   Tobacco Use  Smoking Status Former   Current packs/day: 0.00   Types: Cigarettes   Start date: 06/01/2013   Quit date: 06/01/2014   Years since quitting: 9.9  Smokeless Tobacco Never   Social History   Substance and Sexual Activity  Alcohol Use Not Currently   Alcohol/week: 0.0 standard drinks of alcohol    Family History:  Family History  Problem Relation Age of Onset   Cancer Mother 49       unknown   Heart attack Father 10   Heart attack Sister 2   Diabetes Sister    Colon cancer Neg Hx    Stomach cancer Neg  Hx    Esophageal cancer Neg Hx    Rectal cancer Neg Hx     Past medical history, surgical history, medications, allergies, family history and social history reviewed with patient today and changes made to appropriate areas of the chart.   Review of Systems  Constitutional:  Positive for malaise/fatigue. Negative for fever.  HENT: Negative.    Eyes: Negative.   Respiratory: Negative.    Cardiovascular: Negative.   Gastrointestinal:  Positive for abdominal pain (around [employer]). Negative for constipation and diarrhea.  Genitourinary: Negative.   Musculoskeletal: Negative.   Skin: Negative.   Neurological: Negative.   Psychiatric/Behavioral: Negative.     All other ROS negative except what is listed above and in the HPI.      Objective:    BP (!) 142/90 (BP Location: Left Arm, Cuff Size: Large)   Temp (!) 96.9 F (36.1 C)   Ht 5' 1 (1.549 m)   Wt 170 lb (77.1 kg)   LMP 11/11/2019   BMI 32.12 kg/m   Wt Readings from Last 3 Encounters:  05/25/24 170 lb (77.1 kg)  02/20/24 174 lb (78.9 kg)  10/13/23 172 lb (78 kg)    Physical Exam Vitals and nursing note reviewed.  Constitutional:      General: She is not in acute distress.    Appearance: Normal appearance.  HENT:     Head: Normocephalic and atraumatic.     Right Ear: Tympanic membrane, ear canal and external ear normal.     Left Ear: Tympanic membrane, ear canal and external ear normal.     Mouth/Throat:     Mouth: Mucous membranes are moist.     Pharynx: No posterior oropharyngeal erythema.  Eyes:     Conjunctiva/sclera: Conjunctivae normal.  Cardiovascular:     Rate and Rhythm: Normal rate and regular rhythm.     Pulses: Normal pulses.     Heart sounds: Normal heart sounds.  Pulmonary:     Effort: Pulmonary effort is normal.     Breath sounds: Normal breath sounds.  Abdominal:     Palpations: Abdomen is soft.     Tenderness: There is no abdominal tenderness.  Musculoskeletal:        General: Normal range of  motion.     Cervical back: Normal range of motion and neck supple.     Right lower leg: No edema.     Left lower leg: No edema.  Lymphadenopathy:     Cervical: No cervical adenopathy.  Skin:    General: Skin is warm and dry.  Neurological:     General: No focal deficit present.     Mental Status: She  is alert and oriented to person, place, and time.     Cranial Nerves: No cranial nerve deficit.     Coordination: Coordination normal.     Gait: Gait normal.  Psychiatric:        Mood and Affect: Mood normal.        Behavior: Behavior normal.        Thought Content: Thought content normal.        Judgment: Judgment normal.     Results for orders placed or performed in visit on 02/20/24  POCT glycosylated hemoglobin (Hb A1C)   Collection Time: 02/20/24  3:59 PM  Result Value Ref Range   Hemoglobin A1C 6.5 (A) 4.0 - 5.6 %   HbA1c POC (<> result, manual entry) 6.5 4.0 - 5.6 %   HbA1c, POC (prediabetic range) 6.5 (A) 5.7 - 6.4 %   HbA1c, POC (controlled diabetic range) 6.5 0.0 - 7.0 %      Assessment & Plan:   Problem List Items Addressed This Visit       Cardiovascular and Mediastinum   Hypertension   Chronic, stable. Continue amlodipine  5mg  daily. Check CMP, CBC today.       Relevant Orders   CBC with Differential/Platelet   Comprehensive metabolic panel with GFR     Respiratory   Severe sleep apnea   She refuses to use CPAP. Highly encouraged her to wear this at night since sleep apnea is severe.         Digestive   GERD (gastroesophageal reflux disease)   Chronic, stable. Continue omeprazole  40mg  daily as needed.       Relevant Medications   omeprazole  (PRILOSEC) 40 MG capsule   Dysphagia   Relevant Medications   omeprazole  (PRILOSEC) 40 MG capsule     Endocrine   Diabetes mellitus without complication (HCC)   Chronic, stable. No medication is currently required as dietary management is effectively maintaining glucose levels. She was taking lisinopril  in  the past, but it caused abnormal potassium levels. She declines cholesterol medication today. Check CMP, CBC, A1c, urine microalbumin today. Follow-up in 6 months.       Relevant Orders   CBC with Differential/Platelet   Comprehensive metabolic panel with GFR   Microalbumin / creatinine urine ratio   Hemoglobin A1c     Other   Hyperlipidemia   Will check lipid panel, CMP, CBC and treat based on results.      Relevant Orders   CBC with Differential/Platelet   Comprehensive metabolic panel with GFR   Lipid panel   Encounter for screening mammogram for malignant neoplasm of breast   Relevant Orders   MM 3D SCREENING MAMMOGRAM BILATERAL BREAST   Routine general medical examination at a health care facility - Primary   Health maintenance reviewed and updated. Discussed nutrition, exercise. Follow-up 1 year.        Other Visit Diagnoses       Umbilical pain       No hernia palpated, no rash/redness. Follow-up if symptoms worsen.        Follow up plan: Return in about 6 months (around 11/22/2024) for Diabetes.   LABORATORY TESTING:  - Pap smear: up to date  IMMUNIZATIONS:   - Tdap: Tetanus vaccination status reviewed: last tetanus booster within 10 years. - Influenza: Up to date - Pneumovax: Not applicable - Prevnar: Up to date - HPV: Not applicable - Shingrix vaccine: Declined  SCREENING: -Mammogram: Ordered today  - Colonoscopy: Up to date  -  Bone Density: Not applicable   PATIENT COUNSELING:   Advised to take 1 mg of folate supplement per day if capable of pregnancy.   Sexuality: Discussed sexually transmitted diseases, partner selection, use of condoms, avoidance of unintended pregnancy  and contraceptive alternatives.   Advised to avoid cigarette smoking.  I discussed with the patient that most people either abstain from alcohol or drink within safe limits (<=14/week and <=4 drinks/occasion for males, <=7/weeks and <= 3 drinks/occasion for females) and that  the risk for alcohol disorders and other health effects rises proportionally with the number of drinks per week and how often a drinker exceeds daily limits.  Discussed cessation/primary prevention of drug use and availability of treatment for abuse.   Diet: Encouraged to adjust caloric intake to maintain  or achieve ideal body weight, to reduce intake of dietary saturated fat and total fat, to limit sodium intake by avoiding high sodium foods and not adding table salt, and to maintain adequate dietary potassium and calcium preferably from fresh fruits, vegetables, and low-fat dairy products.    stressed the importance of regular exercise  Injury prevention: Discussed safety belts, safety helmets, smoke detector, smoking near bedding or upholstery.   Dental health: Discussed importance of regular tooth brushing, flossing, and dental visits.    NEXT PREVENTATIVE PHYSICAL DUE IN 1 YEAR. Return in about 6 months (around 11/22/2024) for Diabetes.  Deamber Buckhalter A Orlanda Frankum

## 2024-05-25 NOTE — Assessment & Plan Note (Signed)
Chronic, stable.  Continue amlodipine 5 mg daily. Check CMP, CBC today.

## 2024-05-25 NOTE — Assessment & Plan Note (Signed)
 Chronic, stable. Continue omeprazole  40mg  daily as needed.

## 2024-05-25 NOTE — Assessment & Plan Note (Signed)
 Health maintenance reviewed and updated. Discussed nutrition, exercise. Follow-up 1 year.

## 2024-05-26 ENCOUNTER — Other Ambulatory Visit: Payer: Self-pay | Admitting: Nurse Practitioner

## 2024-05-28 MED ORDER — EMPAGLIFLOZIN 10 MG PO TABS: 10.0000 mg | ORAL_TABLET | Freq: Every day | ORAL | 0 refills | Status: DC

## 2024-05-28 NOTE — Telephone Encounter (Signed)
 Copied from CRM #[id number]. Topic: General - Other >> May 28, 2024  1:46 PM [name] wrote: Reason for CRM: patient returning a call to the office. I did let her know that the provider wanted her to do lab work in two weeks and I read the [employer] message to her. The patient stated they just drew lab work and did urine on Friday and she want to know why they need to do again. Patient also want to know why they want to check her kidneys because they are fine [phone]

## 2024-05-28 NOTE — Telephone Encounter (Signed)
 Left patient a message to return call and notified her that a message was sent via Mychart.

## 2024-05-28 NOTE — Telephone Encounter (Signed)
 Requesting:  LIDOCAINE  5% PATCH  Last Visit: 05/25/2024 Next Visit: 11/22/2024 Last Refill: 03/29/2024  Please Advise

## 2024-05-29 ENCOUNTER — Telehealth: Payer: Self-pay

## 2024-05-29 ENCOUNTER — Other Ambulatory Visit (HOSPITAL_COMMUNITY): Payer: Self-pay

## 2024-05-29 NOTE — Telephone Encounter (Signed)
 Left a detailed message that Jardiance  approved,

## 2024-05-29 NOTE — Telephone Encounter (Signed)
 Can we get a prior auth on Jardiance  10mg  tablets?

## 2024-05-29 NOTE — Telephone Encounter (Signed)
I called patient and left a message to return call.

## 2024-05-29 NOTE — Telephone Encounter (Signed)
 Copied from CRM #[id number]. Topic: General - Other >> May 28, 2024  1:46 PM Rosina BIRCH wrote: Reason for CRM: patient returning a call to the office. I did let her know that the provider wanted her to do lab work in two weeks and I read the [employer] message to her. The patient stated they just drew lab work and did urine on Friday and she want to know why they need to do again. Patient also want to know why they want to check her kidneys because they are fine [phone] >> May 29, 2024  3:59 PM Franky GRADE wrote: Patient is returning a call from Thedora Rings , called CAL and was informed she was on a call with a different patient. Patient states she will be at work and will not be able to pick up the call but asked that a detailed message is left as this is the 3rd time the patient has tried to call back.

## 2024-05-29 NOTE — Telephone Encounter (Signed)
 Pharmacy Patient Advocate Encounter  Received notification from [employer] that Prior Authorization for JARDIANCE  10MG  has been APPROVED from 10.26.25 to 11.25.26. Ran test claim, Copay is $0.00. This test claim was processed through [facility]- copay amounts may vary at other pharmacies due to pharmacy/plan contracts, or as the patient moves through the different stages of their insurance plan.

## 2024-07-03 ENCOUNTER — Encounter: Payer: Self-pay | Admitting: Nurse Practitioner

## 2024-07-03 ENCOUNTER — Ambulatory Visit
Admission: RE | Admit: 2024-07-03 | Discharge: 2024-07-03 | Disposition: A | Discharge status: Home / Self Care | Source: Ambulatory Visit | Attending: Nurse Practitioner | Admitting: Nurse Practitioner

## 2024-07-03 ENCOUNTER — Ambulatory Visit (INDEPENDENT_AMBULATORY_CARE_PROVIDER_SITE_OTHER): Admitting: Nurse Practitioner

## 2024-07-03 VITALS — BP 126/80 | HR 76 | Temp 97.6°F | Ht 61.0 in | Wt 170.2 lb

## 2024-07-03 DIAGNOSIS — E119 Type 2 diabetes mellitus without complications: Secondary | ICD-10-CM

## 2024-07-03 DIAGNOSIS — Z1231 Encounter for screening mammogram for malignant neoplasm of breast: Secondary | ICD-10-CM | POA: Diagnosis not present

## 2024-07-03 DIAGNOSIS — I10 Essential (primary) hypertension: Secondary | ICD-10-CM | POA: Diagnosis not present

## 2024-07-03 DIAGNOSIS — M1990 Unspecified osteoarthritis, unspecified site: Secondary | ICD-10-CM | POA: Diagnosis not present

## 2024-07-03 MED ORDER — LOSARTAN POTASSIUM 25 MG PO TABS: 25.0000 mg | ORAL_TABLET | Freq: Every day | ORAL | 1 refills | Status: DC

## 2024-07-03 MED ORDER — METHOCARBAMOL 500 MG PO TABS: 500.0000 mg | ORAL_TABLET | Freq: Three times a day (TID) | ORAL | 1 refills | Status: AC | PRN

## 2024-07-03 MED ORDER — AMLODIPINE BESYLATE 5 MG PO TABS: 5.0000 mg | ORAL_TABLET | Freq: Every day | ORAL | 1 refills | Status: DC

## 2024-07-03 NOTE — Progress Notes (Signed)
 "  Established Patient Office Visit  Subjective   Patient ID: [name], female    DOB: 09-18-1965  Age: 58 y.o. MRN: [id number]  Chief Complaint  Patient presents with   Prediabetes    Follow up, concerns with spasms in left leg    HPI Discussed the use of AI scribe software for clinical note transcription with the patient, who gave verbal consent to proceed.  History of Present Illness   [name] is a 58 year old female with arthritis who presents with left leg pain and muscle spasms.  She has severe left leg pain and cramping that she feels comes from her hip. Pain limits her ability to walk across the floor and sometimes feels like spasms radiating down the leg. A prior cortisone injection helped for about a week. She currently uses pain patches, Tylenol , and soaking with partial relief. She takes Flexeril  for spasms, but it has delayed onset and causes grogginess, which she attempts to counter with coffee.  After starting Jardiance  she developed vaginal itching that recurred each time she retried the medication, so she has stopped it. She denies chest pain and shortness of breath.       ROS See pertinent positives and negatives per HPI.    Objective:     BP 126/80 (BP Location: Left Arm, Patient Position: Sitting, Cuff Size: Normal)   Pulse 76   Temp 97.6 F (36.4 C)   Ht 5' 1 (1.549 m)   Wt 170 lb 3.2 oz (77.2 kg)   LMP 11/11/2019   SpO2 98%   BMI 32.16 kg/m    Physical Exam Vitals and nursing note reviewed.  Constitutional:      General: She is not in acute distress.    Appearance: Normal appearance.  HENT:     Head: Normocephalic.  Eyes:     Conjunctiva/sclera: Conjunctivae normal.  Cardiovascular:     Rate and Rhythm: Normal rate and regular rhythm.     Pulses: Normal pulses.     Heart sounds: Normal heart sounds.  Pulmonary:     Effort: Pulmonary effort is normal.     Breath sounds: Normal breath sounds.  Musculoskeletal:     Cervical  back: Normal range of motion.  Skin:    General: Skin is warm.  Neurological:     General: No focal deficit present.     Mental Status: She is alert and oriented to person, place, and time.  Psychiatric:        Mood and Affect: Mood normal.        Behavior: Behavior normal.        Thought Content: Thought content normal.        Judgment: Judgment normal.    The 10-year ASCVD risk score (Arnett DK, et al., 2019) is: 12.9%    Assessment & Plan:   Problem List Items Addressed This Visit       Cardiovascular and Mediastinum   Hypertension   Blood pressure is well-controlled with the current regimen. Losartan 25mg  was introduced for renal protection and may require therapy adjustment. Decreased amlodipine  to 5 mg daily. Follow-up in 3-4 weeks.       Relevant Medications   losartan (COZAAR) 25 MG tablet   amLODipine  (NORVASC ) 5 MG tablet     Endocrine   Diabetes mellitus without complication (HCC) - Primary   Chronic, stable. Blood glucose levels are well-controlled. Discontinued Jardiance  due to itching. She has elevated microalbumin in urine. Will start losartan  25mg  daily. Follow-up in 3-4 weeks.       Relevant Medications   losartan (COZAAR) 25 MG tablet     Musculoskeletal and Integument   Arthritis   Chronic osteoarthritis with exacerbation of pain and muscle spasms in left hip. Previous cortisone injection provided temporary relief. Current management includes Tylenol  and topical pain patches. Flexeril  was ineffective. Prescribed methocarbamol  (Robaxin ) 500mg  three times a day as needed for muscle spasms. Continue collaboration with orthopedics.       Relevant Medications   methocarbamol  (ROBAXIN ) 500 MG tablet    Return in about 4 weeks (around 07/31/2024) for 3-4 weeks , HTN, Diabetes.    Tinnie DELENA Harada, NP  "

## 2024-07-03 NOTE — Assessment & Plan Note (Signed)
 Chronic osteoarthritis with exacerbation of pain and muscle spasms in left hip. Previous cortisone injection provided temporary relief. Current management includes Tylenol  and topical pain patches. Flexeril  was ineffective. Prescribed methocarbamol  (Robaxin ) 500mg  three times a day as needed for muscle spasms. Continue collaboration with orthopedics.

## 2024-07-03 NOTE — Patient Instructions (Signed)
 It was great to see you!  Decrease amlodipine  to 5 mg daily  Start losartan 1 tablet daily to help with blood pressure and protect your kidneys   Start methocarbamol  3 times a day as needed for muscle relaxer   Let's follow-up in 3-4 weeks, sooner if you have concerns.  If a referral was placed today, you will be contacted for an appointment. Please note that routine referrals can sometimes take up to 3-4 weeks to process. Please call our office if you haven't heard anything after this time frame.  Take care,  Tinnie Harada, NP

## 2024-07-03 NOTE — Assessment & Plan Note (Signed)
 Blood pressure is well-controlled with the current regimen. Losartan 25mg  was introduced for renal protection and may require therapy adjustment. Decreased amlodipine  to 5 mg daily. Follow-up in 3-4 weeks.

## 2024-07-03 NOTE — Assessment & Plan Note (Signed)
 Chronic, stable. Blood glucose levels are well-controlled. Discontinued Jardiance  due to itching. She has elevated microalbumin in urine. Will start losartan 25mg  daily. Follow-up in 3-4 weeks.

## 2024-07-19 ENCOUNTER — Other Ambulatory Visit: Payer: Self-pay | Admitting: Nurse Practitioner

## 2024-07-19 DIAGNOSIS — I10 Essential (primary) hypertension: Secondary | ICD-10-CM

## 2024-07-19 NOTE — Telephone Encounter (Signed)
 Requesting: AMLODIPINE  BESYLATE 10 MG TAB  Last Visit: 07/03/2024 Next Visit: 08/03/2024 Last Refill: The original prescription was discontinued on 07/03/2024 by Nedra Tinnie LABOR, NP. Renewing this prescription may not be appropriate.   Please Advise

## 2024-07-25 ENCOUNTER — Other Ambulatory Visit: Payer: Self-pay | Admitting: Nurse Practitioner

## 2024-07-25 NOTE — Telephone Encounter (Signed)
 Requesting: AMLODIPINE  BESYLATE 5 MG TAB , LOSARTAN  POTASSIUM 25 MG TAB  Last Visit: 07/03/2024 Next Visit: 08/03/2024 Last Refill: 07/03/2024, 08/03/2024  Please Advise     Pharmacy comment: REQUEST FOR 90 DAYS PRESCRIPTION.

## 2024-08-03 ENCOUNTER — Ambulatory Visit: Payer: Self-pay | Admitting: Nurse Practitioner

## 2024-08-03 ENCOUNTER — Encounter: Payer: Self-pay | Admitting: Nurse Practitioner

## 2024-08-03 VITALS — BP 136/84 | HR 89 | Temp 97.3°F | Ht 61.0 in | Wt 178.0 lb

## 2024-08-03 DIAGNOSIS — I10 Essential (primary) hypertension: Secondary | ICD-10-CM | POA: Diagnosis not present

## 2024-08-03 DIAGNOSIS — E119 Type 2 diabetes mellitus without complications: Secondary | ICD-10-CM | POA: Diagnosis not present

## 2024-08-03 MED ORDER — AMLODIPINE BESYLATE 10 MG PO TABS: 10.0000 mg | ORAL_TABLET | Freq: Every day | ORAL | 1 refills | Status: AC

## 2024-08-03 NOTE — Progress Notes (Signed)
 "   Established Patient Office Visit Subjective:    Patient ID: [name], female    DOB: 08-Jul-1965, 59 y.o.   MRN: [id number]  Chief Complaint Chief Complaint  Patient presents with   HTN and Diabetes    Follow up, Rx refill      HPI [name] is a 59 y.o. female who presents for medication follow up.  When last seen on 07/03/2024, I had started her on Losartan  25mg  daily, for renal protection as micralbumin was elevated and she was experiencing pruritus with Jardiance  so I discontinued this, and lowered Amlodipine  to 5mg  daily.   Discussed the use of AI scribe software for clinical note transcription with the patient, who gave verbal consent to proceed.  Her blood pressure has remained elevated around 150/90 mmHg despite losartan  and amlodipine . She ran out of her 5 mg amlodipine  and began taking 10 mg from an older prescription. She had headaches with elevated blood pressure but none today.   Her blood sugars have been stable. She is trying to follow a healthier diet and plans to increase fruit intake.  She drinks water with electrolytes and notices her blood pressure rises when she is tired. She denies chest pain, shortness of breath, and current headache.     Review of Systems See pertinent positives and negatives per HPI.     Objective:    BP 136/84 (BP Location: Left Arm, Cuff Size: Large)   Pulse 89   Temp (!) 97.3 F (36.3 C)   Ht 5' 1 (1.549 m)   Wt 178 lb (80.7 kg)   LMP 11/11/2019   SpO2 99%   BMI 33.63 kg/m   BP Readings from Last 3 Encounters:  08/03/24 136/84  07/03/24 126/80  05/25/24 (!) 142/90   Wt Readings from Last 3 Encounters:  08/03/24 178 lb (80.7 kg)  07/03/24 170 lb 3.2 oz (77.2 kg)  05/25/24 170 lb (77.1 kg)   Physical Exam Vitals and nursing note reviewed.  Constitutional:      General: She is not in acute distress.    Appearance: Normal appearance.  HENT:     Head: Normocephalic.  Eyes:     Conjunctiva/sclera:  Conjunctivae normal.  Cardiovascular:     Rate and Rhythm: Normal rate and regular rhythm.     Pulses: Normal pulses.     Heart sounds: Normal heart sounds.  Pulmonary:     Effort: Pulmonary effort is normal.     Breath sounds: Normal breath sounds.  Musculoskeletal:     Cervical back: Normal range of motion.  Skin:    General: Skin is warm.  Neurological:     General: No focal deficit present.     Mental Status: She is alert and oriented to person, place, and time.  Psychiatric:        Mood and Affect: Mood normal.        Behavior: Behavior normal.        Thought Content: Thought content normal.        Judgment: Judgment normal.    The 10-year ASCVD risk score (Arnett DK, et al., 2019) is: 16.5%      Assessment & Plan:   Problem List Items Addressed This Visit       Cardiovascular and Mediastinum   Hypertension - Primary   Chronic, not controlled. Blood pressure remains elevated despite losartan  25 mg and amlodipine  5mg  daily. Amlodipine  increased to 10 mg for better control. Continue losartan  potassium 25mg  daily.  Check BMP today. Continue to check BP at home. Follow-up in 3 months.       Relevant Medications   amLODipine  (NORVASC ) 10 MG tablet   Other Relevant Orders   Basic metabolic panel with GFR     Endocrine   Diabetes mellitus without complication (HCC)   Chronic, stable. Blood glucose levels are well-controlled. Continue healthy eating and limiting sugars. Recommend yearly eye exam. Declines statins for CV protection.       Relevant Orders   Basic metabolic panel with GFR    Tinnie Ann Harada, NP  I,[name],acting as a scribe for [employer], NP.,have documented all relevant documentation on the behalf of [name] Ann Harada, NP.  I, Tinnie Ann Harada, NP, have reviewed all documentation for this visit. The documentation on 08/03/2024 for the exam, diagnosis, procedures, and orders are all accurate and complete. "

## 2024-08-03 NOTE — Patient Instructions (Signed)
 It was great to see you!  We are checking your labs today and will let you know the results via mychart/phone.   Let's increase your amlodipine  to 10 mg daily  Keep taking the losartan  daily   Let's follow-up in 3 months, sooner if you have concerns.  If a referral was placed today, you will be contacted for an appointment. Please note that routine referrals can sometimes take up to 3-4 weeks to process. Please call our office if you haven't heard anything after this time frame.  Take care,  Tinnie Harada, NP

## 2024-08-03 NOTE — Assessment & Plan Note (Addendum)
 Chronic, stable. Blood glucose levels are well-controlled. Continue healthy eating and limiting sugars. Recommend yearly eye exam. Declines statins for CV protection.

## 2024-08-03 NOTE — Assessment & Plan Note (Addendum)
 Chronic, not controlled. Blood pressure remains elevated despite losartan  25 mg and amlodipine  5mg  daily. Amlodipine  increased to 10 mg for better control. Continue losartan  potassium 25mg  daily. Check BMP today. Continue to check BP at home. Follow-up in 3 months.

## 2024-08-04 LAB — BASIC METABOLIC PANEL WITH GFR
BUN: 15 mg/dL (ref 7–25)
CO2: 28 mmol/L (ref 20–32)
Calcium: 10.3 mg/dL (ref 8.6–10.4)
Chloride: 103 mmol/L (ref 98–110)
Creat: 0.6 mg/dL (ref 0.50–1.03)
Glucose, Bld: 165 mg/dL — ABNORMAL HIGH (ref 65–99)
Potassium: 3.9 mmol/L (ref 3.5–5.3)
Sodium: 141 mmol/L (ref 135–146)
eGFR: 105 mL/min/{1.73_m2}

## 2024-08-06 ENCOUNTER — Ambulatory Visit: Payer: Self-pay | Admitting: Nurse Practitioner

## 2024-08-08 ENCOUNTER — Other Ambulatory Visit: Payer: Self-pay | Admitting: Nurse Practitioner

## 2024-08-09 ENCOUNTER — Telehealth: Payer: Self-pay

## 2024-08-09 MED ORDER — DICLOFENAC SODIUM 1 % EX GEL: 2.0000 g | Freq: Four times a day (QID) | CUTANEOUS | 1 refills | Status: AC | PRN

## 2024-08-09 NOTE — Telephone Encounter (Signed)
 Left message that Rx approved and sent to [facility].

## 2024-08-09 NOTE — Telephone Encounter (Signed)
 Requesting: diclofenac  Sodium (VOLTAREN ) 1 % GEL  Last Visit: 08/03/2024 Next Visit: 11/22/2024 Last Refill: 05/08/2024  Please Advise

## 2024-08-09 NOTE — Telephone Encounter (Signed)
 Requesting: LIDOCAINE  5% PATCH  Last Visit: 08/03/2024 Next Visit: 11/22/2024 Last Refill: 05/29/2024  Please Advise

## 2024-08-09 NOTE — Telephone Encounter (Signed)
 Copied from CRM [id number]. Topic: Clinical - Medication Refill >> Aug 09, 2024 10:08 AM Burnard DEL wrote: Medication: diclofenac  Sodium (VOLTAREN ) 1 % GEL  Has the patient contacted their pharmacy? Yes (Agent: If no, request that the patient contact the pharmacy for the refill. If patient does not wish to contact the pharmacy document the reason why and proceed with request.) (Agent: If yes, when and what did the pharmacy advise?)  This is the patient's preferred pharmacy:  [id number] [place], [place] - [address] [place] [address] Phone: [phone] Fax: [phone]  Is this the correct pharmacy for this prescription? Yes If no, delete pharmacy and type the correct one.   Has the prescription been filled recently? No  Is the patient out of the medication? Yes  Has the patient been seen for an appointment in the last year OR does the patient have an upcoming appointment? Yes  Can we respond through MyChart? Yes  Agent: Please be advised that Rx refills may take up to 3 business days. We ask that you follow-up with your pharmacy.

## 2024-11-22 ENCOUNTER — Ambulatory Visit: Admitting: Nurse Practitioner
